# Patient Record
Sex: Female | Born: 1970 | Race: Black or African American | Hispanic: No | Smoking: Never smoker
Health system: Southern US, Community
[De-identification: ages and names within clinical notes are randomized; demographics above are authoritative.]

## PROBLEM LIST (undated history)

## (undated) DIAGNOSIS — D219 Benign neoplasm of connective and other soft tissue, unspecified: Secondary | ICD-10-CM

## (undated) DIAGNOSIS — K219 Gastro-esophageal reflux disease without esophagitis: Secondary | ICD-10-CM

## (undated) DIAGNOSIS — M25569 Pain in unspecified knee: Secondary | ICD-10-CM

## (undated) DIAGNOSIS — R011 Cardiac murmur, unspecified: Secondary | ICD-10-CM

## (undated) DIAGNOSIS — D649 Anemia, unspecified: Secondary | ICD-10-CM

## (undated) DIAGNOSIS — N390 Urinary tract infection, site not specified: Secondary | ICD-10-CM

## (undated) HISTORY — PX: BARIATRIC SURGERY: SHX1103

## (undated) HISTORY — PX: KNEE SURGERY: SHX244

## (undated) HISTORY — PX: ABDOMINAL HYSTERECTOMY: SHX81

## (undated) HISTORY — PX: OTHER SURGICAL HISTORY: SHX169

---

## 1999-01-21 ENCOUNTER — Emergency Department (HOSPITAL_COMMUNITY): Admission: EM | Admit: 1999-01-21 | Discharge: 1999-01-21 | Payer: Self-pay | Admitting: Emergency Medicine

## 2000-02-02 ENCOUNTER — Encounter: Payer: Self-pay | Admitting: Emergency Medicine

## 2000-02-02 ENCOUNTER — Emergency Department (HOSPITAL_COMMUNITY): Admission: EM | Admit: 2000-02-02 | Discharge: 2000-02-02 | Payer: Self-pay | Admitting: Emergency Medicine

## 2000-03-28 ENCOUNTER — Emergency Department (HOSPITAL_COMMUNITY): Admission: EM | Admit: 2000-03-28 | Discharge: 2000-03-28 | Payer: Self-pay | Admitting: Emergency Medicine

## 2000-06-21 ENCOUNTER — Emergency Department (HOSPITAL_COMMUNITY): Admission: EM | Admit: 2000-06-21 | Discharge: 2000-06-21 | Payer: Self-pay | Admitting: Emergency Medicine

## 2001-09-18 ENCOUNTER — Emergency Department (HOSPITAL_COMMUNITY): Admission: EM | Admit: 2001-09-18 | Discharge: 2001-09-18 | Payer: Self-pay | Admitting: Emergency Medicine

## 2003-08-30 ENCOUNTER — Encounter (HOSPITAL_COMMUNITY): Admission: RE | Admit: 2003-08-30 | Discharge: 2003-11-28 | Payer: Self-pay | Admitting: Internal Medicine

## 2003-09-03 ENCOUNTER — Emergency Department (HOSPITAL_COMMUNITY): Admission: EM | Admit: 2003-09-03 | Discharge: 2003-09-03 | Payer: Self-pay | Admitting: Emergency Medicine

## 2003-09-09 ENCOUNTER — Emergency Department (HOSPITAL_COMMUNITY): Admission: EM | Admit: 2003-09-09 | Discharge: 2003-09-09 | Payer: Self-pay | Admitting: Emergency Medicine

## 2003-10-29 ENCOUNTER — Emergency Department (HOSPITAL_COMMUNITY): Admission: EM | Admit: 2003-10-29 | Discharge: 2003-10-29 | Payer: Self-pay | Admitting: Emergency Medicine

## 2003-11-27 ENCOUNTER — Emergency Department (HOSPITAL_COMMUNITY): Admission: EM | Admit: 2003-11-27 | Discharge: 2003-11-27 | Payer: Self-pay | Admitting: Emergency Medicine

## 2004-01-22 ENCOUNTER — Emergency Department (HOSPITAL_COMMUNITY): Admission: EM | Admit: 2004-01-22 | Discharge: 2004-01-23 | Payer: Self-pay | Admitting: Emergency Medicine

## 2006-02-16 ENCOUNTER — Emergency Department (HOSPITAL_COMMUNITY): Admission: EM | Admit: 2006-02-16 | Discharge: 2006-02-16 | Payer: Self-pay | Admitting: Emergency Medicine

## 2007-12-21 ENCOUNTER — Emergency Department (HOSPITAL_COMMUNITY): Admission: EM | Admit: 2007-12-21 | Discharge: 2007-12-21 | Payer: Self-pay | Admitting: Emergency Medicine

## 2009-03-10 ENCOUNTER — Emergency Department (HOSPITAL_BASED_OUTPATIENT_CLINIC_OR_DEPARTMENT_OTHER): Admission: EM | Admit: 2009-03-10 | Discharge: 2009-03-10 | Payer: Self-pay | Admitting: Emergency Medicine

## 2009-06-08 ENCOUNTER — Emergency Department (HOSPITAL_COMMUNITY): Admission: EM | Admit: 2009-06-08 | Discharge: 2009-06-08 | Payer: Self-pay | Admitting: Emergency Medicine

## 2009-08-02 ENCOUNTER — Emergency Department (HOSPITAL_COMMUNITY): Admission: EM | Admit: 2009-08-02 | Discharge: 2009-08-02 | Payer: Self-pay | Admitting: Emergency Medicine

## 2010-05-12 ENCOUNTER — Emergency Department (HOSPITAL_COMMUNITY): Admission: EM | Admit: 2010-05-12 | Discharge: 2010-05-12 | Payer: Self-pay | Admitting: Emergency Medicine

## 2010-05-15 ENCOUNTER — Emergency Department (HOSPITAL_COMMUNITY): Admission: EM | Admit: 2010-05-15 | Discharge: 2010-05-16 | Payer: Self-pay | Admitting: Emergency Medicine

## 2010-05-21 ENCOUNTER — Emergency Department (HOSPITAL_COMMUNITY): Admission: EM | Admit: 2010-05-21 | Discharge: 2010-05-22 | Payer: Self-pay | Admitting: Emergency Medicine

## 2010-07-28 ENCOUNTER — Emergency Department (HOSPITAL_BASED_OUTPATIENT_CLINIC_OR_DEPARTMENT_OTHER)
Admission: EM | Admit: 2010-07-28 | Discharge: 2010-07-28 | Payer: Self-pay | Source: Home / Self Care | Admitting: Emergency Medicine

## 2010-10-16 LAB — RAPID STREP SCREEN (MED CTR MEBANE ONLY): Streptococcus, Group A Screen (Direct): NEGATIVE

## 2010-10-18 LAB — COMPREHENSIVE METABOLIC PANEL
AST: 13 U/L (ref 0–37)
Albumin: 3.2 g/dL — ABNORMAL LOW (ref 3.5–5.2)
Chloride: 109 mEq/L (ref 96–112)
Creatinine, Ser: 0.58 mg/dL (ref 0.4–1.2)
GFR calc Af Amer: >60 mL/min (ref >60)
Potassium: 3 mEq/L — ABNORMAL LOW (ref 3.5–5.1)
Total Bilirubin: 0.3 mg/dL (ref 0.3–1.2)

## 2010-10-18 LAB — URINALYSIS, ROUTINE W REFLEX MICROSCOPIC
Nitrite: NEGATIVE
Specific Gravity, Urine: 1.012 (ref 1.005–1.030)
Urobilinogen, UA: 0.2 mg/dL (ref 0.0–1.0)

## 2010-10-18 LAB — SALICYLATE LEVEL: Salicylate Lvl: <4 mg/dL (ref 2.8–20.0)

## 2010-10-18 LAB — RAPID URINE DRUG SCREEN, HOSP PERFORMED: Tetrahydrocannabinol: NOT DETECTED

## 2010-10-18 LAB — DIFFERENTIAL
Basophils Relative: 1 % (ref 0–1)
Eosinophils Relative: 1 % (ref 0–5)
Monocytes Absolute: 0.2 10*3/uL (ref 0.1–1.0)
Monocytes Relative: 7 % (ref 3–12)
Neutrophils Relative %: 49 % (ref 43–77)

## 2010-10-18 LAB — URINE MICROSCOPIC-ADD ON

## 2010-10-18 LAB — CBC
MCH: 17.2 pg — ABNORMAL LOW (ref 26.0–34.0)
Platelets: 228 10*3/uL (ref 150–400)
RBC: 3.99 MIL/uL (ref 3.87–5.11)
WBC: 3.3 10*3/uL — ABNORMAL LOW (ref 4.0–10.5)

## 2010-10-18 LAB — ACETAMINOPHEN LEVEL: Acetaminophen (Tylenol), Serum: <10 ug/mL — ABNORMAL LOW (ref 10–30)

## 2010-10-18 LAB — POCT PREGNANCY, URINE: Preg Test, Ur: NEGATIVE

## 2010-10-19 LAB — CBC
HCT: 23 % — ABNORMAL LOW (ref 36.0–46.0)
MCH: 16.8 pg — ABNORMAL LOW (ref 26.0–34.0)
MCV: 56.4 fL — ABNORMAL LOW (ref 78.0–100.0)
RBC: 4.08 MIL/uL (ref 3.87–5.11)
RDW: 20.3 % — ABNORMAL HIGH (ref 11.5–15.5)
WBC: 4.6 10*3/uL (ref 4.0–10.5)

## 2010-10-19 LAB — LIPASE, BLOOD: Lipase: 31 U/L (ref 11–59)

## 2010-10-19 LAB — URINALYSIS, ROUTINE W REFLEX MICROSCOPIC
Bilirubin Urine: NEGATIVE
Glucose, UA: NEGATIVE mg/dL
Glucose, UA: NEGATIVE mg/dL
Hgb urine dipstick: NEGATIVE
Protein, ur: NEGATIVE mg/dL
Protein, ur: NEGATIVE mg/dL
pH: 6 (ref 5.0–8.0)

## 2010-10-19 LAB — DIFFERENTIAL
Basophils Absolute: 0 10*3/uL (ref 0.0–0.1)
Eosinophils Absolute: 0 10*3/uL (ref 0.0–0.7)
Monocytes Absolute: 0.4 10*3/uL (ref 0.1–1.0)
Neutrophils Relative %: 43 % (ref 43–77)

## 2010-10-19 LAB — COMPREHENSIVE METABOLIC PANEL
Alkaline Phosphatase: 67 U/L (ref 39–117)
BUN: 10 mg/dL (ref 6–23)
Chloride: 107 mEq/L (ref 96–112)
Glucose, Bld: 97 mg/dL (ref 70–99)
Potassium: 3.3 mEq/L — ABNORMAL LOW (ref 3.5–5.1)
Total Bilirubin: <0.1 mg/dL — ABNORMAL LOW (ref 0.3–1.2)

## 2010-10-19 LAB — URINE MICROSCOPIC-ADD ON

## 2010-10-19 LAB — PREGNANCY, URINE: Preg Test, Ur: NEGATIVE

## 2010-11-06 LAB — URINE MICROSCOPIC-ADD ON

## 2010-11-06 LAB — URINALYSIS, ROUTINE W REFLEX MICROSCOPIC
Glucose, UA: NEGATIVE mg/dL
Ketones, ur: NEGATIVE mg/dL
pH: 6 (ref 5.0–8.0)

## 2010-12-01 ENCOUNTER — Emergency Department (HOSPITAL_BASED_OUTPATIENT_CLINIC_OR_DEPARTMENT_OTHER)
Admission: EM | Admit: 2010-12-01 | Discharge: 2010-12-01 | Disposition: A | Discharge status: Home / Self Care | Payer: Self-pay | Attending: Emergency Medicine | Admitting: Emergency Medicine

## 2010-12-01 DIAGNOSIS — K089 Disorder of teeth and supporting structures, unspecified: Secondary | ICD-10-CM | POA: Insufficient documentation

## 2011-01-08 ENCOUNTER — Emergency Department (HOSPITAL_COMMUNITY): Payer: Medicaid Other

## 2011-01-08 ENCOUNTER — Emergency Department (HOSPITAL_COMMUNITY)
Admission: EM | Admit: 2011-01-08 | Discharge: 2011-01-08 | Disposition: A | Discharge status: Home / Self Care | Payer: Medicaid Other | Attending: Emergency Medicine | Admitting: Emergency Medicine

## 2011-01-08 DIAGNOSIS — X500XXA Overexertion from strenuous movement or load, initial encounter: Secondary | ICD-10-CM | POA: Insufficient documentation

## 2011-01-08 DIAGNOSIS — S93409A Sprain of unspecified ligament of unspecified ankle, initial encounter: Secondary | ICD-10-CM | POA: Insufficient documentation

## 2011-01-08 DIAGNOSIS — Y92009 Unspecified place in unspecified non-institutional (private) residence as the place of occurrence of the external cause: Secondary | ICD-10-CM | POA: Insufficient documentation

## 2011-01-08 DIAGNOSIS — M25476 Effusion, unspecified foot: Secondary | ICD-10-CM | POA: Insufficient documentation

## 2011-01-08 DIAGNOSIS — M25579 Pain in unspecified ankle and joints of unspecified foot: Secondary | ICD-10-CM | POA: Insufficient documentation

## 2011-01-08 DIAGNOSIS — M25473 Effusion, unspecified ankle: Secondary | ICD-10-CM | POA: Insufficient documentation

## 2011-01-08 DIAGNOSIS — S9000XA Contusion of unspecified ankle, initial encounter: Secondary | ICD-10-CM | POA: Insufficient documentation

## 2011-02-06 ENCOUNTER — Inpatient Hospital Stay (HOSPITAL_COMMUNITY)
Admission: EM | Admit: 2011-02-06 | Discharge: 2011-02-09 | DRG: 313 | Disposition: A | Discharge status: Home / Self Care | Payer: Medicaid Other | Attending: Internal Medicine | Admitting: Internal Medicine

## 2011-02-06 ENCOUNTER — Encounter: Payer: Self-pay | Admitting: Internal Medicine

## 2011-02-06 ENCOUNTER — Emergency Department (HOSPITAL_COMMUNITY): Payer: Medicaid Other

## 2011-02-06 DIAGNOSIS — F1410 Cocaine abuse, uncomplicated: Secondary | ICD-10-CM | POA: Diagnosis present

## 2011-02-06 DIAGNOSIS — F3289 Other specified depressive episodes: Secondary | ICD-10-CM | POA: Diagnosis present

## 2011-02-06 DIAGNOSIS — E669 Obesity, unspecified: Secondary | ICD-10-CM | POA: Diagnosis present

## 2011-02-06 DIAGNOSIS — D259 Leiomyoma of uterus, unspecified: Secondary | ICD-10-CM | POA: Diagnosis present

## 2011-02-06 DIAGNOSIS — K5900 Constipation, unspecified: Secondary | ICD-10-CM | POA: Diagnosis present

## 2011-02-06 DIAGNOSIS — N920 Excessive and frequent menstruation with regular cycle: Secondary | ICD-10-CM | POA: Diagnosis present

## 2011-02-06 DIAGNOSIS — E876 Hypokalemia: Secondary | ICD-10-CM | POA: Diagnosis present

## 2011-02-06 DIAGNOSIS — K219 Gastro-esophageal reflux disease without esophagitis: Secondary | ICD-10-CM | POA: Diagnosis present

## 2011-02-06 DIAGNOSIS — D500 Iron deficiency anemia secondary to blood loss (chronic): Secondary | ICD-10-CM | POA: Diagnosis present

## 2011-02-06 DIAGNOSIS — F329 Major depressive disorder, single episode, unspecified: Secondary | ICD-10-CM | POA: Diagnosis present

## 2011-02-06 DIAGNOSIS — R0789 Other chest pain: Principal | ICD-10-CM | POA: Diagnosis present

## 2011-02-06 DIAGNOSIS — Z880 Allergy status to penicillin: Secondary | ICD-10-CM

## 2011-02-06 DIAGNOSIS — K648 Other hemorrhoids: Secondary | ICD-10-CM | POA: Diagnosis present

## 2011-02-06 LAB — DIFFERENTIAL
Basophils Absolute: 0 10*3/uL (ref 0.0–0.1)
Eosinophils Absolute: 0.1 10*3/uL (ref 0.0–0.7)
Lymphs Abs: 1.9 10*3/uL (ref 0.7–4.0)
Monocytes Absolute: 0.3 10*3/uL (ref 0.1–1.0)

## 2011-02-06 LAB — RETICULOCYTES
RBC.: 3.69 MIL/uL — ABNORMAL LOW (ref 3.87–5.11)
Retic Count, Absolute: 40 10*3/uL (ref 19.0–186.0)
Retic Ct Pct: 1.2 % (ref 0.4–3.1)

## 2011-02-06 LAB — COMPREHENSIVE METABOLIC PANEL
ALT: <5 U/L (ref 0–35)
AST: 8 U/L (ref 0–37)
Alkaline Phosphatase: 67 U/L (ref 39–117)
CO2: 28 mEq/L (ref 19–32)
Calcium: 8.8 mg/dL (ref 8.4–10.5)
Chloride: 105 mEq/L (ref 96–112)
GFR calc Af Amer: >60 mL/min (ref >60)
GFR calc non Af Amer: >60 mL/min (ref >60)
Glucose, Bld: 87 mg/dL (ref 70–99)
Potassium: 3 mEq/L — ABNORMAL LOW (ref 3.5–5.1)
Sodium: 140 mEq/L (ref 135–145)
Total Bilirubin: 0.2 mg/dL — ABNORMAL LOW (ref 0.3–1.2)

## 2011-02-06 LAB — URINALYSIS, ROUTINE W REFLEX MICROSCOPIC
Bilirubin Urine: NEGATIVE
Glucose, UA: NEGATIVE mg/dL
Ketones, ur: NEGATIVE mg/dL
Leukocytes, UA: NEGATIVE
Specific Gravity, Urine: 1.02 (ref 1.005–1.030)
pH: 6 (ref 5.0–8.0)

## 2011-02-06 LAB — APTT: aPTT: 30 seconds (ref 24–37)

## 2011-02-06 LAB — RAPID URINE DRUG SCREEN, HOSP PERFORMED
Amphetamines: NOT DETECTED
Barbiturates: NOT DETECTED
Benzodiazepines: NOT DETECTED
Cocaine: NOT DETECTED

## 2011-02-06 LAB — CBC
Hemoglobin: 6 g/dL — CL (ref 12.0–15.0)
MCH: 16.4 pg — ABNORMAL LOW (ref 26.0–34.0)
MCHC: 28.2 g/dL — ABNORMAL LOW (ref 30.0–36.0)
MCV: 60.9 fL — ABNORMAL LOW (ref 78.0–100.0)
MCV: 59.8 fL — ABNORMAL LOW (ref 78.0–100.0)
Platelets: 321 10*3/uL (ref 150–400)
Platelets: 305 10*3/uL (ref 150–400)
RBC: 3.68 MIL/uL — ABNORMAL LOW (ref 3.87–5.11)
RDW: 19.3 % — ABNORMAL HIGH (ref 11.5–15.5)
RDW: 19.7 % — ABNORMAL HIGH (ref 11.5–15.5)
WBC: 4.3 10*3/uL (ref 4.0–10.5)

## 2011-02-06 LAB — CARDIAC PANEL(CRET KIN+CKTOT+MB+TROPI)
Relative Index: INVALID (ref 0.0–2.5)
Total CK: 41 U/L (ref 7–177)
Troponin I: <0.3 ng/mL (ref <0.30)

## 2011-02-06 LAB — POCT I-STAT, CHEM 8
Calcium, Ion: 1.17 mmol/L (ref 1.12–1.32)
Glucose, Bld: 94 mg/dL (ref 70–99)
HCT: 23 % — ABNORMAL LOW (ref 36.0–46.0)
Hemoglobin: 7.8 g/dL — ABNORMAL LOW (ref 12.0–15.0)
Potassium: 3.1 mEq/L — ABNORMAL LOW (ref 3.5–5.1)
TCO2: 25 mmol/L (ref 0–100)

## 2011-02-06 LAB — OCCULT BLOOD, POC DEVICE: Fecal Occult Bld: NEGATIVE

## 2011-02-06 LAB — HEMOGLOBIN A1C: Hgb A1c MFr Bld: 5.6 % (ref <5.7)

## 2011-02-06 LAB — FOLATE: Folate: 10.4 ng/mL

## 2011-02-06 LAB — IRON AND TIBC: Iron: 12 ug/dL — ABNORMAL LOW (ref 42–135)

## 2011-02-06 LAB — VITAMIN B12: Vitamin B-12: 401 pg/mL (ref 211–911)

## 2011-02-06 LAB — CK TOTAL AND CKMB (NOT AT ARMC)
CK, MB: 0.8 ng/mL (ref 0.3–4.0)
Total CK: 42 U/L (ref 7–177)

## 2011-02-06 LAB — ABO/RH: ABO/RH(D): AB POS

## 2011-02-06 NOTE — H&P (Signed)
Hospital Admission Note Date: 02/06/2011  Patient name: Karen Lutz Medical record number: [id number] Date of birth: 1970-08-12 Age: 40 y.o. Gender: female PCP: No primary provider on file.   Attending physician: Dr. Aundria Rud  First contact: Resident (R1): Dr. Candy Sledge Pager: [phone] Second contact: Resident (R3): Dr. Denton Meek Pager: [phone]  Weekends, holidays, and after 5 PM weekdays First contact: [phone] Second contact: [phone]   Chief Complaint: chest pain  History of Present Illness:  Patient is a 40 year old female with past medical history significant for anemia who presents with acute onset of substernal chest pain that woke her from sleep at approximately 6 AM on the day of admission. She describes her pain as constant, nonradiating, sharp, 10 out of 10 central chest pain that improved to a 7/10 after taking nitroglycerin. She notes that her pain worsens with palpation. She denies any other precipitating, aggravating, or alleviating factors. She denies shortness of breath, dyspnea on exertion, palpitations, nausea, vomiting, diaphoresis, fever, chills, and syncope.  She admits to poorly controlled reflux with increased symptoms recently. She notes recent increase in stress at home; she is having some conflict with family members and also notes that her girlfriend recently moved out of their home.  She admits to pressure in and denies suicidal/homicidal ideation.  After further discussion patient admits to painful bowel movements over the past 3 weeks prior to her arrival that began after her problems with hard stools. She notes the presence of bright red blood on tissues with wiping. She denies passing copious amounts of bright red blood or clots.  She admits to intermittent dark, tarry stools occurring over the past few years. She states this has never been worked up.  She states her most recent episode of this occurred approximately 4-5 days prior to her admission and is now  resolved.  She admits to increased loose stools over the past few days prior to her arrival.  Denies any sick contacts.  Home meds:   Patient states of his recently prescribed Abilify approximately 2 weeks ago by a nurse practitioner and friend with whom she works.  She became nauseated and experienced vomiting after beginning this medication she has since stopped.  She states that this was prescribed with Lamictal to help her depression and anger.  She has also not taken Lamictal over the past few days.  She denies taking any other prescribed medications, over-the-counter meds, or supplements.  Her last menstrual period began on 02/01/2011 and ended on 02/05/11.  Allergies: PCN (hives, rash)   PMH Anemia, microcytic    - Unknown etiology    - baseline Hbg 7.0    - She has required transfusion of PRBCs in the past; last transfusion occurred in 2004 GERD   Family Hx: Father: Diabetes mellitus, hypertension, MI in his late 27s, s/p CABG Mother: Hypertension, schizophrenia, numerous mental health issues. Grandmother: Diabetes mellitus, hypertension, deceased secondary to gangrene Grandfather: Diabetes mellitus, hypertension, alcoholism She reports that numerous family members have problems with alcoholism and drug addiction.  Social Hx: Currently living alone in [place].  She was living with her girlfriend however they recently had an argument and her girlfriend has moved out. She works full time as an Counsellor at [employer]. She has never smoked and does not drink alcohol.  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  She denies any current illicit drug use.   Review of Systems: Pertinent items are noted in HPI.  Physical Exam: Vitals: T: 98.1, HR: 66, RR 19, BP 96/57, O2 sat: 100% RA VItal signs reviewed and stable.  Blood pressure slightly low; patient asymptomatic. GEN: No apparent distress but tearful throughout  interview.  Alert and oriented x 3.  Pleasant, conversant, and cooperative to exam. HEENT: head is autraumatic and normocephalic.  Neck is supple without palpable masses or lymphadenopathy.  No JVD or carotid bruits.  Vision intact.  EOMI.  PERRLA.  Sclerae anicteric.  Conjunctivae with slight pallor; no injection. Mucous membranes are moist.  Oropharynx is without erythema, exudates, or other abnormal lesions.  RESP:  Lungs are clear to ascultation bilaterally with good air movement.  No wheezes, ronchi, or rubs. CARDIOVASCULAR: regular rate, normal rhythm.  Clear S1, S2, no murmurs, gallops, or rubs.  Pain is elicited with palpation of the sternum, bilateral costal-chondral joints, and left anterior chest wall. ABDOMEN: soft, non-tender, non-distended.  Bowels sounds present in all quadrants and normoactive.  No palpable masses. RECTAL: No external hemorrhoids noted. No anal fissure, abnormal lesions, or other external abnormality observed.  No internal hemorrhoids or palpable masses on digital rectal exam. Stool was light brown in color,  No evidence of blood on exam. FOBT negative EXT: warm and dry.  Peripheral pulses equal, intact, and +2 globally.  No clubbing or cyanosis.  No edema in bilateral lower extremities. SKIN: warm and dry with normal turgor.  No rashes or abnormal lesions observed. NEURO: CN II-XII grossly intact.  Muscle strength +5/5 in bilateral upper and lower extremities.  Sensation is grossly intact.  No focal deficit.   Lab results:  Ionized Calcium                          1.17              1.12-1.32        mmol/L  Hemoglobin (HGB)                         7.8        l      12.0-15.0        g/dL  Hematocrit (HCT)                         23.0       l      36.0-46.0        %  Sodium (NA)                              141               135-145          mEq/L  Potassium (K)                            3.1        l      3.5-5.1          mEq/L  Chloride                                  105               96-112           MEq/L  TCO2                                     25                0-100            mmol/L  Glucose                                  94                70-99            mg/dL  BUN                                      7                 6-23             mg/dL  Creatinine                               0.60              0.50-1.10        Mg/dL  Creatine Kinase, Total                   42                7-177            U/L  CK, MB                                   0.8               0.3-4.0          ng/mL  Relative Index                           SEE NOTE.         0.0-2.5 Troponin I                               <0.30             <0.30            Ng/mL  HCG-Qualitative, Urine                   NEGATIVE   Color, Urine                             YELLOW            YELLOW  Appearance                               CLEAR             CLEAR  Specific Gravity  1.020             1.005-1.030  pH                                       6.0               5.0-8.0  Urine Glucose                            NEGATIVE          NEG              mg/dL  Bilirubin                                NEGATIVE          NEG  Ketones                                  NEGATIVE          NEG              mg/dL  Blood                                    NEGATIVE          NEG  Protein                                  NEGATIVE          NEG              mg/dL  Urobilinogen                             1.0               0.0-1.0          mg/dL  Nitrite                                  NEGATIVE          NEG  Leukocytes                               NEGATIVE          NEG   WBC                                      3.8        l      4.0-10.5         K/uL  RBC                                      3.69       l      3.87-5.11  MIL/uL  Hemoglobin (HGB)                         6.2        L      12.0-15.0        g/dL  Hematocrit (HCT)                         22.0       l      36.0-46.0         %  MCV                                      59.6       l      78.0-100.0       fL  MCH -                                    16.8       l      26.0-34.0        pg  MCHC                                     28.2       l      30.0-36.0        g/dL  RDW                                      19.3       h      11.5-15.5        %  Platelet Count (PLT)                     331               150-400          K/uL  Neutrophils, %                           40         l      43-77            %  Lymphocytes, %                           48         h      12-46            %  Monocytes, %                             9                 3-12             %  Eosinophils, %                           2  0-5              %  Basophils, %                             1                 0-1              %  Neutrophils, Absolute                    1.5        l      1.7-7.7          K/uL  Lymphocytes, Absolute                    1.9               0.7-4.0          K/uL  Monocytes, Absolute                      0.3               0.1-1.0          K/uL  Eosinophils, Absolute                    0.1               0.0-0.7          K/uL  Basophils, Absolute                      0.0               0.0-0.1          K/uL  RBC Morphology                           SEE NOTE.    ELLIPTOCYTES    POLYCHROMASIA PRESENT  Imaging results:  CXR 2 view:  Findings: Heart and mediastinal contours appear within normal limits.  The lung fields appear clear with no signs of focal infiltrate or congestive failure.  No pleural fluid or peribronchial cuffing is seen.  No pneumothorax is noted.  No focal bony abnormalities are identified.  IMPRESSION: Normal cardiopulmonary appearance with no worrisome focal or acute abnormality identified.   Assessment & Plan by Problem:  Chest pain: Patient is a 40 year old female presenting with acute onset of nonradiating substernal chest pain.  She is not tachycardic, tachypnea, or hypoxic  and has no risk factors for PE; her revised Geneva score is 0 consistent with a very low probability for pulmonary embolus.  Will not proceed with further evaluation for PE at this time.  Her initial sets of cardiac markers was within normal limits and her EKG reveals normal sinus rhythm with nonspecific T wave flattening from V1 to V5; there are no EKG findings consistent with acute ischemia or other concerning abnormality.  Her chest x-ray does not reveal any evidence to suggest pneumonia, pneumothorax, or mediastinal widening consistent with aortic dissection.  It seems unlikely that her chest pain is related to elicit drug use, particularly crack cocaine as she reports a 4 year period of sobriety and her UDS is negative for illicit substances. Her chest pain is most likely the result of chest wall tenderness  and underlying, poorly controlled GERD likely exacerbated by her recent increased stress and anxiety.  She reports a significant family history for coronary artery disease; will fully evaluate her for ACS. - Admit to telemetry - Cycle cardiac enzymes - Repeat 12-lead EKG in a.m. - Risk stratify with fasting lipid panel and hemoglobin A1c - Protonix for underlying GERD - Sublingual nitroglycerin and morphine for pain control  Anemia, microcytic: Patient reports a long history of anemia that she feels may be related to heavy menstruation. Review of E-Chart reveals her baseline hemoglobin is approximately 7. She denies syncope, shortness of breath, dyspnea on exertion and is currently asymptomatic.  Her rectal exam is within normal limits with a negative FOBT, however it is still possible she is experiencing blood loss from a GI source.   - Will check coags - Cycle CBCs Q8 - Will type and screen for 2 units PRBCs and hold transfusion for now; will transfuse if her repeat CBC revealed hemoglobin less than or equal to 6, or if she becomes symptomatic - Will place 2 large-bore IVs in the event she  requires urgent transfusion - Repeat FOBT - Will check an anemia panel, including reticulocyte count  Hypokalemia: It seems most likely that her mild hypokalemia as a result of increased loose stools and diarrhea. Her i-STAT chemistry panel reveals a normal CO2 with anion gap of 11.  Will repeat a comprehensive metabolic panel.  Will replete her potassium orally and check a magnesium level.    Hypotension:  Patient's blood pressure is slightly low in the high 90s to low 110s systolic.  She is currently asymptomatic with a normal heart rate.  Will administer a normal saline bolus and continue to follow closely.  VTE prophy: SCDs  Dispo: Admit to tele.  She is currently without a primary care provider; will ensure she has followed established at the [facility].   ____________ Nelda Bucks, PGY-3     ____________ Quentin Ore, PGY-1

## 2011-02-07 ENCOUNTER — Observation Stay (HOSPITAL_COMMUNITY): Payer: Medicaid Other

## 2011-02-07 DIAGNOSIS — N926 Irregular menstruation, unspecified: Secondary | ICD-10-CM

## 2011-02-07 DIAGNOSIS — N939 Abnormal uterine and vaginal bleeding, unspecified: Secondary | ICD-10-CM

## 2011-02-07 DIAGNOSIS — D508 Other iron deficiency anemias: Secondary | ICD-10-CM

## 2011-02-07 DIAGNOSIS — K922 Gastrointestinal hemorrhage, unspecified: Secondary | ICD-10-CM

## 2011-02-07 DIAGNOSIS — R079 Chest pain, unspecified: Secondary | ICD-10-CM

## 2011-02-07 DIAGNOSIS — D649 Anemia, unspecified: Secondary | ICD-10-CM

## 2011-02-07 LAB — CBC
Hemoglobin: 7.6 g/dL — ABNORMAL LOW (ref 12.0–15.0)
MCH: 16.6 pg — ABNORMAL LOW (ref 26.0–34.0)
MCHC: 28.5 g/dL — ABNORMAL LOW (ref 30.0–36.0)
Platelets: 319 10*3/uL (ref 150–400)
RBC: 3.74 MIL/uL — ABNORMAL LOW (ref 3.87–5.11)
WBC: 3.7 10*3/uL — ABNORMAL LOW (ref 4.0–10.5)
WBC: 4.7 10*3/uL (ref 4.0–10.5)

## 2011-02-07 LAB — LIPID PANEL
Cholesterol: 124 mg/dL (ref 0–200)
LDL Cholesterol: 66 mg/dL (ref 0–99)
Total CHOL/HDL Ratio: 3 RATIO
VLDL: 16 mg/dL (ref 0–40)

## 2011-02-07 LAB — BASIC METABOLIC PANEL
BUN: 9 mg/dL (ref 6–23)
CO2: 26 mEq/L (ref 19–32)
Chloride: 108 mEq/L (ref 96–112)
Creatinine, Ser: 0.53 mg/dL (ref 0.50–1.10)
Glucose, Bld: 83 mg/dL (ref 70–99)

## 2011-02-07 LAB — LIPASE, BLOOD: Lipase: 46 U/L (ref 11–59)

## 2011-02-08 DIAGNOSIS — K921 Melena: Secondary | ICD-10-CM

## 2011-02-08 DIAGNOSIS — K625 Hemorrhage of anus and rectum: Secondary | ICD-10-CM

## 2011-02-08 DIAGNOSIS — D509 Iron deficiency anemia, unspecified: Secondary | ICD-10-CM

## 2011-02-08 DIAGNOSIS — K648 Other hemorrhoids: Secondary | ICD-10-CM

## 2011-02-08 LAB — BASIC METABOLIC PANEL
GFR calc Af Amer: >60 mL/min (ref >60)
GFR calc non Af Amer: >60 mL/min (ref >60)
Glucose, Bld: 80 mg/dL (ref 70–99)
Potassium: 3.2 mEq/L — ABNORMAL LOW (ref 3.5–5.1)
Sodium: 141 mEq/L (ref 135–145)

## 2011-02-08 LAB — CBC
Hemoglobin: 8.4 g/dL — ABNORMAL LOW (ref 12.0–15.0)
MCH: 18.2 pg — ABNORMAL LOW (ref 26.0–34.0)
MCHC: 29.6 g/dL — ABNORMAL LOW (ref 30.0–36.0)
MCV: 61.6 fL — ABNORMAL LOW (ref 78.0–100.0)

## 2011-02-09 DIAGNOSIS — D649 Anemia, unspecified: Secondary | ICD-10-CM

## 2011-02-09 DIAGNOSIS — R079 Chest pain, unspecified: Secondary | ICD-10-CM

## 2011-02-09 LAB — BASIC METABOLIC PANEL
BUN: 7 mg/dL (ref 6–23)
Calcium: 8.8 mg/dL (ref 8.4–10.5)
Chloride: 107 mEq/L (ref 96–112)
Creatinine, Ser: 0.58 mg/dL (ref 0.50–1.10)
GFR calc Af Amer: >60 mL/min (ref >60)
GFR calc non Af Amer: >60 mL/min (ref >60)

## 2011-02-09 LAB — CBC
MCHC: 27.9 g/dL — ABNORMAL LOW (ref 30.0–36.0)
MCV: 62.4 fL — ABNORMAL LOW (ref 78.0–100.0)
Platelets: 301 10*3/uL (ref 150–400)
RDW: 21.8 % — ABNORMAL HIGH (ref 11.5–15.5)
WBC: 5.6 10*3/uL (ref 4.0–10.5)

## 2011-02-10 LAB — TYPE AND SCREEN
Antibody Screen: NEGATIVE
Unit division: 0

## 2011-02-12 NOTE — Discharge Summary (Signed)
Pt admitted on 7/3 with substernal chest pain and Hgb 6. Cardiac w/u for CP negative. Endoscopy and colonoscopy revealed internal hemorrhoids and no other source of bleeding. Pelvic and transvaginal US revealed leiomyoma, likely source of bleeding. Was discharged on 7/6. Will need ob-gyn f/u. Please recheck Hgb. Pt also endorses depression. Was started on Celexa.

## 2011-02-12 NOTE — Discharge Summary (Signed)
Karen Lutz, KNISKERN           ACCOUNT NO.:  [id number]  MEDICAL RECORD NO.:  [id number]  LOCATION:  2025                         FACILITY:  [facility]  PHYSICIAN:  C. Ulyess Mort, M.D.DATE OF BIRTH:  1971-06-26  DATE OF ADMISSION:  02/06/2011 DATE OF DISCHARGE:  02/09/2011                              DISCHARGE SUMMARY   DISCHARGE DIAGNOSES: 1. 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 2. Musculoskeletal wall chest pain. 4. Depression. 5. Gastroesophageal reflux. 6. Constipation. 7. Internal hemorrhoids per colonsocopy.  DISCHARGE MEDICATIONS: 1. Citalopram 20 mg p.o. daily. 2. Ferrous sulfate 325 mg p.o. t.i.d. 3. MiraLax 17 g or 1 capsule daily, dissolved in 8 ounces of fluid. 4. Omeprazole 20 mg p.o. daily.  The patient was advised to discontinue use of NSAIDs due to risk of gastric ulcer.  DISPOSITION AND FOLLOWUP:  Patient was discharged in stable condition. Karen Lutz will follow up with Dr. Blanca Friend at [facility] on February 15, 2011, at 1:30 p.m. to establish primary care.  At the time of her visit, please 1. Check hemoglobin. 2. Arrange for OB/GYN referral for uterine fibroids - likely would need a hysterectomy. 3. Discuss symptoms of depression and medication management. 4. Evaluate need for continued use of omeprazole for gastric reflux.  PROCEDURES PERFORMED:  Upper endoscopy and colonoscopy on February 08, 2011, revealed internal hemorrhoids.  No ulcers, masses, or other abnormalities were visualized.  No source of active bleeding identified.  RADIOLOGY:  Pelvic and transvaginal ultrasound:  Uterine leiomyoma measuring 3.75 x 3.4 x 4.3 cm in the dorsal body of the uterus, extending to the base of the endometrium.  CONSULTATION:  None.  ADMITTING HISTORY AND PHYSICAL:  The patient is a 40 year old female with past medical history significant for anemia who presents with acute onset of substernal  chest pain that woke her up from sleep at approximately 6 a.m. on the day of admission.  Karen Lutz describes her pain as constant, nonradiating, sharp, 10/10, central chest pain that improved to 7/10 after taking nitroglycerin.  Karen Lutz notes her pain worsens with palpation. Karen Lutz denies any other precipitating, aggravating, or alleviating factors. Karen Lutz denies shortness of breath, dyspnea on exertion, palpitations, nausea, vomiting, diaphoresis, fever, chills, and syncope.  Karen Lutz admits to poorly-controlled reflux with increasing symptoms recently.  Karen Lutz notes recent increase in stress at home.  Karen Lutz is having some conflicts with the family members and also notes her girlfriend recently moved out of their home.  Karen Lutz admits to depression and denies suicidal or homicidal ideation.  After further discussion, the patient admits to painful bowel movements over the past few weeks prior to arrival that began after her problems with hard stools.  Karen Lutz notes the presence of bright red blood on tissues with wiping.  Karen Lutz denies passing copious amounts of bright red blood or clot.  Karen Lutz admits to intermittent dark tarry stools occurring over the past few years.  Karen Lutz states that this has never been worked up.  Karen Lutz states her most recent episode of this occurred approximately 4-5 days prior to admission and it is now resolved.  Karen Lutz admits to increased loose stools over the past few days prior to  her arrival at the hospital.  Karen Lutz denies any sick contacts.  HOME MEDICATIONS:  The patient states Karen Lutz was recently prescribed Abilify approximately 2 weeks ago by nurse practitioner.   Karen Lutz has become  nauseated and experienced vomiting after beginning this medication.  Karen Lutz since stopped this medication. Karen Lutz states that this was prescribed with Lamictal to help her depression and anger.  Karen Lutz has also not taking Lamictal over the past few days. Karen Lutz denies taking any other prescribed medicines, over-the-counter medicines or  supplements.  Her last menstrual period began on February 01, 2011 and ended on February 05, 2011.  ALLERGIES:  Karen Lutz has allergies to PENICILLIN which causes hives or rash.  PAST MEDICAL HISTORY:  Significant for microcytic anemia of unknown etiology with a baseline hemoglobin of 7.0.  Karen Lutz has required transfusion of packed red blood cells in the past, last transfusion occurred in 2004.  Past medical history is also significant for gastric reflux.  ADMITTING LAB RESULTS:  Hemoglobin 7.8, hematocrit 23.0.  Sodium 141, potassium 3.1, chloride 105, bicarb 25, glucose 94, BUN 7, and creatinine 0.60.  Creatine kinase total 42, CK-MB 0.8, troponin less than 0.30.  UPT negative.  UA negative.  WBC 3.8, hemoglobin 6.2, hematocrit 22.0, MCV 59.6, platelet 331.  ADMISSION STUDIES:  EKG, sinus bradycardia and no evidence of ischemia. Chest x-ray, normal with no acute or focal abnormalities.  HOSPITAL COURSE:  Karen Lutz is a 40 year old woman who was admitted to our service with chest pain and a hemoglobin of 6.2.  Following is the summary of this hospital course presented by problem identified.  1. Chest pain.  The patient's chest pain was felt to be unlikely     cardiac in nature given its reproducibility with palpation of the     sternum, as well as a normal EKG findings and negative cardiac     biomarkers.  Acute pulmonary disease was excluded by a normal chest     x-ray.  The pain was thought to be musculoskeletal in origin,     possibly costochondritis.  Acute MI was excluded with serial     biomarkers and the patient's pain improved with observation and     conservative management.  On discharge, this pain had resolved.     The patient had a normal lipid panel and was normotensive     throughout hospitalization. 2. Iron deficiency anemia.  At the time of admission, the patient had     a hemoglobin of 6 with iron panel consistent with iron deficiency     anemia.  Fecal occult blood tests were  negative x2.  The patient     was given 1 unit of packed red blood cells, and hemoglobin improved     to 7.6. Iron supplementation was initiated during hospitalization     and should be continued as an outpatient.  The patient endorses     intermittent melena over the past 5 years as well as bright red     blood on toilet paper after defecation.  The patient underwent     upper endoscopy and colonoscopy to look for possible source of     bleeding.  Internal hemorrhoids were the only abnormalities     identified.  Additionally, the patient endorsed menorrhagia and     underwent pelvic and transvaginal ultrasounds to investigate     gynecologic source of bleeding.  A uterine fibroid was identified.     The patient did not require additional packed red blood  cells     transfusions and had a hemoglobin of 7.6 on the day of discharge.     The patient should follow up with OB/GYN to discuss management     options for uterine fibroids.  PCP should continue to monitor     hemoglobin. 3. Depression.  The patient endorses feelings of depression and at     times anger which Karen Lutz attributes to family and relationship stress.     Karen Lutz denies any SI/HI or mania.     Karen Lutz works at a [employer] facility, and one of the nurse     practitioners who also works at the facility started her on Abilify     and Lamictal for her psychiatric complaints.  We did not continue     these medications during hospitalization.  We initiated Celexa 20     mg daily as the first line treatment for depression.  The patient     was counseled that this medication may take 4-8 weeks to work and     was encouraged to discuss her psychiatric medicines with her new     primary care physician at her followup appointment. 4. Gastric reflux.  The patient endorses chronic reflux symptoms     including throat irritation and a sensation of food and acid in the     back of her mouth.  Karen Lutz was given Protonix during hospitalization      and given a prescription for omeprazole at discharge.  DISCHARGE CONDITION:  The patient was discharged in improved condition.  DISCHARGE VITALS:  Temperature 98.4, pulse 55, respirations 19, blood pressure 114/62, O2 saturation 98% on room air.  DISCHARGE LABS:  Sodium 141, potassium 3.5, chloride 107, CO2 27, glucose 70, BUN 7, creatinine 0.58, calcium 8.8.  WBC 5.6, hemoglobin 7.6, hematocrit 27.2, MCV 62.4, platelet count 301.    ______________________________ Wendall Papa   ______________________________ C. Ulyess Mort, M.D.    ER/MEDQ  D:  02/09/2011  T:  02/10/2011  Job:  [id number]  Electronically Signed by Deatra Robinson  on 02/10/2011 10:21:03 AM Electronically Signed by Eliezer Lofts M.D. on 02/12/2011 08:28:10 AM

## 2011-02-15 ENCOUNTER — Encounter: Payer: Self-pay | Admitting: Internal Medicine

## 2011-02-15 ENCOUNTER — Ambulatory Visit (INDEPENDENT_AMBULATORY_CARE_PROVIDER_SITE_OTHER): Payer: Medicaid Other | Admitting: Internal Medicine

## 2011-02-15 DIAGNOSIS — D509 Iron deficiency anemia, unspecified: Secondary | ICD-10-CM | POA: Insufficient documentation

## 2011-02-15 DIAGNOSIS — D259 Leiomyoma of uterus, unspecified: Secondary | ICD-10-CM

## 2011-02-15 DIAGNOSIS — K219 Gastro-esophageal reflux disease without esophagitis: Secondary | ICD-10-CM | POA: Insufficient documentation

## 2011-02-15 DIAGNOSIS — D649 Anemia, unspecified: Secondary | ICD-10-CM

## 2011-02-15 DIAGNOSIS — F39 Unspecified mood [affective] disorder: Secondary | ICD-10-CM | POA: Insufficient documentation

## 2011-02-15 LAB — TSH: TSH: 0.801 u[IU]/mL (ref 0.350–4.500)

## 2011-02-15 MED ORDER — OMEPRAZOLE 20 MG PO CPDR: 20.0000 mg | DELAYED_RELEASE_CAPSULE | Freq: Every day | ORAL | Status: DC

## 2011-02-15 MED ORDER — FERROUS SULFATE 325 (65 FE) MG PO TBEC: 325.0000 mg | DELAYED_RELEASE_TABLET | Freq: Three times a day (TID) | ORAL | Status: DC

## 2011-02-15 MED ORDER — DOCUSATE SODIUM 100 MG PO CAPS: 100.0000 mg | ORAL_CAPSULE | Freq: Two times a day (BID) | ORAL | Status: DC

## 2011-02-15 NOTE — Progress Notes (Signed)
Subjective:    Patient ID: Karen Lutz, female    DOB: 02/27/1971, 40 y.o.   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  Cardiac workup was negative, urine drug screen was negative, an inpatient team concluded that GERD was most likely cause of her chest pain. She was found to be anemic with a hemoglobin of 6.2 on admission which was elevated to 7.6 at discharge after transfusion.  Iron panel workup indicated this was an iron deficiency anemia with a ferritin of 4.    She has a one-month history of depressed mood with a decreased interest in her normal fun activities and decreased appetite with associated 20 pound weight loss per patient.  She admits to having recent thoughts of self-harm but has not had any thoughts of a plan to do so.  She has seen at [place] in the past for counseling.  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  She went to see a nurse practitioner who she works with who prescribed her Abilify and Lamictal. She discontinued these due to nausea and vomiting. She gives a history of episodes of depressed mood throughout her life as well as anger management problems.   Review of Systems Review of Systems - History obtained from the patient General ROS: positive for  - weight loss negative for - chills, fatigue or fever Psychological ROS: see HPI Respiratory ROS: no cough, shortness of breath, or wheezing Cardiovascular ROS: no dyspnea on exertion or palpitations Gastrointestinal ROS: no abdominal pain, change in bowel habits, or black or bloody stools Genito-Urinary ROS: no dysuria, trouble voiding, or hematuria Musculoskeletal ROS: negative for - gait disturbance, joint pain or muscle pain Neurological ROS: negative for - bowel and bladder control  changes, headaches, impaired coordination/balance or numbness/tingling Dermatological ROS: Negative for rash     Objective:   Physical Exam   General: alert, well-developed, and cooperative to examination.  Head: normocephalic and atraumatic.  Eyes: vision grossly intact, pupils equal, pupils round, pupils reactive to light, no injection and anicteric.  Mouth: pharynx pink and moist, no erythema, and no exudates.  Lungs: normal respiratory effort, no accessory muscle use, normal breath sounds, no crackles, and no wheezes. Heart: normal rate, regular rhythm, no murmur, no gallop, and no rub.  Msk: no joint swelling, no joint warmth, and no redness over joints.  Pulses: 2+ DP/PT pulses bilaterally Extremities: No cyanosis, clubbing, edema Neurologic: alert & oriented X3, cranial nerves II-XII intact, strength normal in all extremities, and gait normal.  Skin: turgor normal and no rashes.  Psych: Oriented X3, memory intact for recent and remote, normally interactive, good eye contact, not anxious appearing, and not depressed appearing.    Assessment & Plan:

## 2011-02-15 NOTE — Progress Notes (Deleted)
  Subjective:    Patient ID: Karen Lutz, female    DOB: August 06, 1971, 40 y.o.   MRN: [id number]  HPI    Review of Systems     Objective:   Physical Exam        Assessment & Plan:

## 2011-02-15 NOTE — Assessment & Plan Note (Signed)
Refer to [facility] for evaluation of heavy mentrual bleeding in setting of finding uterine fibroid on ultrasound in hospital.

## 2011-02-15 NOTE — Assessment & Plan Note (Signed)
Hospital workup concluded that GERD was most likely cause of her chest pain.  She takes 3-4 Zantac 150mg  per day for heartburn. Given this high dose of H2 blocker she warrants PPI therapy.  Omeprazole 20mg  PO daily

## 2011-02-15 NOTE — Assessment & Plan Note (Signed)
Iron deficiency anemia based on labs during hospitalization.  Based on normal EGD, colonoscopy just finding non-bleeding hemorrhoids, and finding of uterine fibroids on ultrasound in hospital, Karen Lutz's anemia is likely from her heavy menstrual bleeding, which is likely from fibroids.    Plan: Refer to OB Gyn.   Check CBC today Iron Supplementation 325mg  Iron Sulfate PO TID Colace 100mg  BID for stool softening in setting of iron therapy

## 2011-02-15 NOTE — Assessment & Plan Note (Addendum)
Karen Lutz gives a history of depression and trouble controlling her anger.  Her current depression is likely exacerbated by her recent breakup with her significant other.  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 This history is concerning that Karen Lutz could possibly have a mood disorder with both depressive and manic characteristics.  She admits to recent thoughts of self-harm but no plan of how to harm herself.   She needs a full mental health evaluation.   She promised [place] today that she would not hurt herself and that she would go to [facility] tomorrow morning for evaluation.  We will hold off on starting any medications until she is fully evaluated. We also gave her phone numbers for multiple hotlines that she can call should she ever be having thoughts of self-harm.    Check TSH today

## 2011-02-15 NOTE — Patient Instructions (Signed)
Please go to [facility] by tomorrow morning.  Please return to clinic in 2 weeks.   We are referring you to Obstetrics and Gynecology.  Our clinic social worker will also be getting in contact with you.

## 2011-02-16 LAB — CBC
MCHC: 28.1 g/dL — ABNORMAL LOW (ref 30.0–36.0)
Platelets: 309 10*3/uL (ref 150–400)
RDW: 25.1 % — ABNORMAL HIGH (ref 11.5–15.5)
WBC: 5.1 10*3/uL (ref 4.0–10.5)

## 2011-02-19 NOTE — Discharge Summary (Signed)
  Karen Lutz, Karen Lutz           ACCOUNT NO.:  [id number]  MEDICAL RECORD NO.:  [id number]  LOCATION:  2025                         FACILITY:  [facility]  PHYSICIAN:  C. Ulyess Mort, M.D.DATE OF BIRTH:  1971-03-13  DATE OF ADMISSION:  02/06/2011 DATE OF DISCHARGE:  02/09/2011                              DISCHARGE SUMMARY   ADDENDUM  This addendum is to be included in the admitting history and physical.  PHYSICAL EXAMINATION:  VITAL SIGNS:  Temperature 98.1, heart rate 66, respiratory rate 19, blood pressure 96/57, oxygen saturation 100% on room air. GENERAL:  No apparent distress, but tearful throughout interview.  Alert and oriented x3.  Pleasant, conversant, and cooperative to exam. HEENT:  Head is atraumatic and normocephalic.  Neck is supple without palpable masses or lymphadenopathy.  No JVD or carotid bruits.  Vision intact.  Extraocular movements intact.  Pupils equally round and reactive to light.  Sclerae anicteric.  Conjunctivae with slight pallor, no injection.  Mucous membranes are moist.  Oropharynx is without erythema, exudates, or other abnormal lesion. RESPIRATORY:  Lungs were clear to auscultation bilaterally with good air movement.  No wheezes, rhonchi or rubs. CARDIOVASCULAR:  Regular rate, normal rhythm.  Clear S1-S2.  No murmurs, gallops or rubs.  Pain is elicited with palpation of the sternum, bilateral costochondral joints, and left anterior chest wall. ABDOMEN:  Soft, nontender, nondistended.  Bowel sounds present in all quadrants and normoactive.  No palpable masses. RECTAL:  No external hemorrhoids noted.  No anal fissure, abnormal lesions, or external abnormality observed.  No internal hemorrhoids or palpable masses on the digital rectal exam.  Stool was light brown in color, no evidence of blood on exam.  Fecal occult blood test negative. EXTREMITIES:  Warm and dry.  Peripheral pulses equal, intact, and +2 globally.  No clubbing or cyanosis.  No  edema in bilateral lower extremities. SKIN:  Warm and dry with normal turgor.  No rashes or abnormal lesions observed. NEURO:  Cranial nerves II-XII grossly intact.  Muscle strength 5/5 in bilateral upper and lower extremities.  Sensation is grossly intact.  No focal deficits.    Deatra Robinson, MD   ______________________________ C. Ulyess Mort, M.D.   NK/MEDQ  D:  02/09/2011  T:  02/10/2011  Job:  [id number]  Electronically Signed by Deatra Robinson  on 02/16/2011 12:14:12 PM Electronically Signed by Eliezer Lofts M.D. on 02/19/2011 04:26:09 PM

## 2011-02-28 ENCOUNTER — Encounter: Payer: Self-pay | Admitting: Internal Medicine

## 2011-04-02 ENCOUNTER — Emergency Department (INDEPENDENT_AMBULATORY_CARE_PROVIDER_SITE_OTHER): Payer: Medicaid Other

## 2011-04-02 ENCOUNTER — Encounter (HOSPITAL_BASED_OUTPATIENT_CLINIC_OR_DEPARTMENT_OTHER): Payer: Self-pay

## 2011-04-02 ENCOUNTER — Emergency Department (HOSPITAL_BASED_OUTPATIENT_CLINIC_OR_DEPARTMENT_OTHER)
Admission: EM | Admit: 2011-04-02 | Discharge: 2011-04-03 | Disposition: A | Discharge status: Home / Self Care | Payer: Medicaid Other | Attending: Emergency Medicine | Admitting: Emergency Medicine

## 2011-04-02 DIAGNOSIS — R062 Wheezing: Secondary | ICD-10-CM

## 2011-04-02 DIAGNOSIS — H109 Unspecified conjunctivitis: Secondary | ICD-10-CM

## 2011-04-02 DIAGNOSIS — J40 Bronchitis, not specified as acute or chronic: Secondary | ICD-10-CM | POA: Insufficient documentation

## 2011-04-02 DIAGNOSIS — R05 Cough: Secondary | ICD-10-CM

## 2011-04-02 DIAGNOSIS — R509 Fever, unspecified: Secondary | ICD-10-CM | POA: Insufficient documentation

## 2011-04-02 DIAGNOSIS — R079 Chest pain, unspecified: Secondary | ICD-10-CM

## 2011-04-02 DIAGNOSIS — R0989 Other specified symptoms and signs involving the circulatory and respiratory systems: Secondary | ICD-10-CM

## 2011-04-02 MED ORDER — ALBUTEROL SULFATE (5 MG/ML) 0.5% IN NEBU
5.0000 mg | INHALATION_SOLUTION | Freq: Once | RESPIRATORY_TRACT | Status: AC
  Administered 2011-04-02: 5 mg via RESPIRATORY_TRACT
  Filled 2011-04-02: qty 1

## 2011-04-02 MED ORDER — DIPHENHYDRAMINE HCL 25 MG PO CAPS
25.0000 mg | ORAL_CAPSULE | Freq: Four times a day (QID) | ORAL | Status: DC | PRN
  Administered 2011-04-02: 25 mg via ORAL
  Filled 2011-04-02 (×2): qty 1

## 2011-04-02 NOTE — ED Provider Notes (Signed)
History     CSN: [id number] Arrival date & time: 04/02/2011 10:06 PM  Chief Complaint  Patient presents with  . URI   HPI Comments: Pt states that she is having some right eye irritation today:pt states that she has tried otc cold medications without relief  Patient is a 40 y.o. female presenting with URI. The history is provided by the patient. No language interpreter was used.  URI The primary symptoms include fever, sore throat and cough. Primary symptoms do not include nausea or vomiting. The current episode started more than 1 week ago. This is a new problem. The problem has not changed since onset. Symptoms associated with the illness include chills and congestion. The illness is not associated with facial pain or sinus pressure.    History reviewed. No pertinent past medical history.  Past Surgical History  Procedure Date  . Cesarean section   . Knee surgery     No family history on file.  History  Substance Use Topics  . Smoking status: Never Smoker   . Smokeless tobacco: Not on file  . Alcohol Use: No    OB History    Grav Para Term Preterm Abortions TAB SAB Ect Mult Living                  Review of Systems  Constitutional: Positive for fever and chills.  HENT: Positive for congestion and sore throat. Negative for sinus pressure.   Respiratory: Positive for cough.   Gastrointestinal: Negative for nausea and vomiting.  All other systems reviewed and are negative.    Physical Exam  BP 110/61  Pulse 87  Temp(Src) 97.7 F (36.5 C) (Oral)  Resp 18  SpO2 100%  LMP 03/29/2011  Physical Exam  Nursing note and vitals reviewed. Constitutional: She is oriented to person, place, and time. She appears well-developed and well-nourished.  HENT:  Head: Normocephalic.  Right Ear: External ear normal.  Mouth/Throat: Oropharynx is clear and moist.  Eyes: EOM are normal. Pupils are equal, round, and reactive to light. Right eye exhibits discharge.     Cardiovascular: Normal rate and regular rhythm.   Pulmonary/Chest: Effort normal. She has wheezes.  Neurological: She is alert and oriented to person, place, and time.  Skin: Skin is warm and dry.    ED Course  Procedures  Dg Chest 2 View  04/02/2011  *RADIOLOGY REPORT*  Clinical Data: Mid chest pain, cough, congestion, wheezing.  CHEST - 2 VIEW  Comparison: 02/06/2011  Findings: Lungs are clear. No pleural effusion or pneumothorax. The cardiomediastinal contours are within normal limits. The visualized bones and soft tissues are without significant appreciable abnormality.  IMPRESSION: No acute cardiopulmonary process.  Original Report Authenticated By: Waneta Martins, M.D.    MDM Will treat for bronchitis and conjunctivitis      Teressa Lower, NP 04/03/11 0008

## 2011-04-02 NOTE — ED Notes (Signed)
C/o prod cough, runny nose x 2 weeks-c/o irritation to right eye today

## 2011-04-03 MED ORDER — ERYTHROMYCIN 5 MG/GM OP OINT
TOPICAL_OINTMENT | Freq: Three times a day (TID) | OPHTHALMIC | Status: DC
  Administered 2011-04-03: via OPHTHALMIC
  Filled 2011-04-03: qty 3.5

## 2011-04-03 MED ORDER — PREDNISONE 10 MG PO TABS: 20.0000 mg | ORAL_TABLET | Freq: Every day | ORAL | Status: AC

## 2011-04-03 MED ORDER — AZITHROMYCIN 250 MG PO TABS: 250.0000 mg | ORAL_TABLET | Freq: Every day | ORAL | Status: AC

## 2011-04-03 MED ORDER — ALBUTEROL SULFATE HFA 108 (90 BASE) MCG/ACT IN AERS: 2.0000 | INHALATION_SPRAY | RESPIRATORY_TRACT | Status: DC | PRN

## 2011-04-03 MED ORDER — ALBUTEROL SULFATE HFA 108 (90 BASE) MCG/ACT IN AERS
INHALATION_SPRAY | RESPIRATORY_TRACT | Status: AC
  Filled 2011-04-03: qty 6.7

## 2011-04-03 NOTE — Patient Instructions (Signed)
Instructed pt on the proper use of administering albuteral mdi via aerochamber pt tolerated well 
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
## 2011-04-06 ENCOUNTER — Telehealth: Payer: Self-pay | Admitting: Licensed Clinical Social Worker

## 2011-04-06 NOTE — ED Provider Notes (Signed)
Medical screening examination/treatment/procedure(s) were performed by non-physician practitioner and as supervising physician I was immediately available for consultation/collaboration.   Charles B. Bernette Mayers, MD 04/06/11 [phone]

## 2011-04-06 NOTE — Telephone Encounter (Signed)
Called patient for follow-up.  She is connected to [employer] for counseling and sees Rosey Bath. She had a counseling appmt on 8/29 and said that counseling was going well and she will be seen regularly there.

## 2011-04-25 ENCOUNTER — Emergency Department (HOSPITAL_COMMUNITY)
Admission: EM | Admit: 2011-04-25 | Discharge: 2011-04-25 | Disposition: A | Discharge status: Home / Self Care | Payer: Medicaid Other | Attending: Emergency Medicine | Admitting: Emergency Medicine

## 2011-04-25 DIAGNOSIS — T148XXA Other injury of unspecified body region, initial encounter: Secondary | ICD-10-CM | POA: Insufficient documentation

## 2011-04-25 DIAGNOSIS — S335XXA Sprain of ligaments of lumbar spine, initial encounter: Secondary | ICD-10-CM | POA: Insufficient documentation

## 2011-04-25 DIAGNOSIS — M549 Dorsalgia, unspecified: Secondary | ICD-10-CM | POA: Insufficient documentation

## 2011-04-25 DIAGNOSIS — Y92410 Unspecified street and highway as the place of occurrence of the external cause: Secondary | ICD-10-CM | POA: Insufficient documentation

## 2011-06-23 ENCOUNTER — Encounter (HOSPITAL_BASED_OUTPATIENT_CLINIC_OR_DEPARTMENT_OTHER): Payer: Self-pay | Admitting: Emergency Medicine

## 2011-06-23 ENCOUNTER — Emergency Department (HOSPITAL_BASED_OUTPATIENT_CLINIC_OR_DEPARTMENT_OTHER)
Admission: EM | Admit: 2011-06-23 | Discharge: 2011-06-23 | Disposition: A | Discharge status: Home / Self Care | Payer: Medicaid Other | Attending: Emergency Medicine | Admitting: Emergency Medicine

## 2011-06-23 DIAGNOSIS — H109 Unspecified conjunctivitis: Secondary | ICD-10-CM | POA: Insufficient documentation

## 2011-06-23 DIAGNOSIS — H5710 Ocular pain, unspecified eye: Secondary | ICD-10-CM | POA: Insufficient documentation

## 2011-06-23 MED ORDER — POLYMYXIN B-TRIMETHOPRIM 10000-0.1 UNIT/ML-% OP SOLN: 1.0000 [drp] | OPHTHALMIC | Status: AC

## 2011-06-23 MED ORDER — FLUORESCEIN SODIUM 1 MG OP STRP
ORAL_STRIP | OPHTHALMIC | Status: AC
  Filled 2011-06-23: qty 1

## 2011-06-23 MED ORDER — TETRACAINE HCL 0.5 % OP SOLN
1.0000 [drp] | Freq: Once | OPHTHALMIC | Status: DC
  Filled 2011-06-23: qty 2

## 2011-06-23 NOTE — ED Notes (Signed)
Bilateral eye irritation

## 2011-06-23 NOTE — ED Provider Notes (Signed)
History     CSN: [id number] Arrival date & time: 06/23/2011 12:41 PM   First MD Initiated Contact with Patient 06/23/11 1249      Chief Complaint  Patient presents with  . Eye Pain    Pt reports bilateral eye irritation x 1 week current contact lens wearer    (Consider location/radiation/quality/duration/timing/severity/associated sxs/prior treatment) HPI  History reviewed. No pertinent past medical history. Patient has been complaining of bilateral eye irritation for the last week. She does wear contact lenses but states she's only worn them a couple of days in the last couple weeks. Patient denies any crusting or drainage. She does feel there is rotated both eyes. Her vision has not been affected. Patient denies any trauma. There's no fevers. Nothing seems to make it better or worse particularly. The severity is mild Past Surgical History  Procedure Date  . Cesarean section   . Knee surgery   . Ceaserian     History reviewed. No pertinent family history.  History  Substance Use Topics  . Smoking status: Never Smoker   . Smokeless tobacco: Not on file  . Alcohol Use: No    OB History    Grav Para Term Preterm Abortions TAB SAB Ect Mult Living                  Review of Systems  All other systems reviewed and are negative.    Allergies  Penicillins  Home Medications   Current Outpatient Rx  Name Route Sig Dispense Refill  . DOCUSATE SODIUM 100 MG PO CAPS Oral Take 1 capsule (100 mg total) by mouth 2 (two) times daily. 60 capsule 6  . FERROUS SULFATE 325 (65 FE) MG PO TBEC Oral Take 1 tablet (325 mg total) by mouth 3 (three) times daily with meals. 90 tablet 6  . OMEPRAZOLE 20 MG PO CPDR Oral Take 1 capsule (20 mg total) by mouth daily. 30 capsule 6    BP 120/60  Pulse 77  Temp(Src) 98 F (36.7 C) (Oral)  Resp 22  SpO2 100%  LMP 06/23/2011  Physical Exam  Nursing note and vitals reviewed. Constitutional: She appears well-developed and  well-nourished. No distress.  HENT:  Head: Normocephalic and atraumatic.  Right Ear: External ear normal.  Left Ear: External ear normal.  Eyes: Conjunctivae, EOM and lids are normal. Pupils are equal, round, and reactive to light. Right eye exhibits no chemosis and no discharge. Left eye exhibits no chemosis and no discharge. No scleral icterus.  Slit lamp exam:      The right eye shows no corneal ulcer and no fluorescein uptake.       The left eye shows no corneal ulcer and no fluorescein uptake.       No slit lamp was used, Wood's light was used to assess for ulceration  Neck: Neck supple. No tracheal deviation present.  Cardiovascular: Normal rate.   Pulmonary/Chest: Effort normal. No stridor. No respiratory distress.  Musculoskeletal: She exhibits no edema.  Neurological: She is alert. Cranial nerve deficit: no gross deficits.  Skin: Skin is warm and dry. No rash noted.  Psychiatric: She has a normal mood and affect.    ED Course  Procedures (including critical care time)  Labs Reviewed - No data to display No results found.    MDM  Patient has mild eye irritation. There does not appear to be any evidence of coronary ulceration. However the patient discontinued her contact lens use. She will also will avoid  any eye makeup. I will prescribe her course her monitor and have her followup with an eye doctor next week to be rechecked   Diagnosis: Conjunctivitis     Celene Kras, MD 06/23/11 1313

## 2011-08-06 ENCOUNTER — Encounter (HOSPITAL_BASED_OUTPATIENT_CLINIC_OR_DEPARTMENT_OTHER): Payer: Self-pay | Admitting: Emergency Medicine

## 2011-08-06 ENCOUNTER — Emergency Department (HOSPITAL_BASED_OUTPATIENT_CLINIC_OR_DEPARTMENT_OTHER)
Admission: EM | Admit: 2011-08-06 | Discharge: 2011-08-06 | Disposition: A | Discharge status: Home / Self Care | Payer: Medicaid Other | Attending: Emergency Medicine | Admitting: Emergency Medicine

## 2011-08-06 DIAGNOSIS — S0083XA Contusion of other part of head, initial encounter: Secondary | ICD-10-CM

## 2011-08-06 DIAGNOSIS — S0003XA Contusion of scalp, initial encounter: Secondary | ICD-10-CM | POA: Insufficient documentation

## 2011-08-06 DIAGNOSIS — M549 Dorsalgia, unspecified: Secondary | ICD-10-CM | POA: Insufficient documentation

## 2011-08-06 DIAGNOSIS — M542 Cervicalgia: Secondary | ICD-10-CM | POA: Insufficient documentation

## 2011-08-06 DIAGNOSIS — S1093XA Contusion of unspecified part of neck, initial encounter: Secondary | ICD-10-CM | POA: Insufficient documentation

## 2011-08-06 MED ORDER — IBUPROFEN 800 MG PO TABS: 800.0000 mg | ORAL_TABLET | Freq: Three times a day (TID) | ORAL | Status: AC

## 2011-08-06 MED ORDER — HYDROCODONE-ACETAMINOPHEN 5-325 MG PO TABS: 2.0000 | ORAL_TABLET | ORAL | Status: AC | PRN

## 2011-08-06 MED ORDER — IBUPROFEN 800 MG PO TABS: 800.0000 mg | ORAL_TABLET | Freq: Three times a day (TID) | ORAL | Status: DC

## 2011-08-06 MED ORDER — HYDROCODONE-ACETAMINOPHEN 5-325 MG PO TABS
2.0000 | ORAL_TABLET | Freq: Once | ORAL | Status: AC
  Administered 2011-08-06: 2 via ORAL
  Filled 2011-08-06: qty 2
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
## 2011-08-07 NOTE — ED Provider Notes (Signed)
History/physical exam/procedure(s) were performed by non-physician practitioner and as supervising physician I was immediately available for consultation/collaboration. I have reviewed all notes and am in agreement with care and plan.   Hilario Quarry, MD 08/07/11 [phone]

## 2011-09-03 ENCOUNTER — Encounter (HOSPITAL_BASED_OUTPATIENT_CLINIC_OR_DEPARTMENT_OTHER): Payer: Self-pay | Admitting: *Deleted

## 2011-09-03 ENCOUNTER — Emergency Department (HOSPITAL_BASED_OUTPATIENT_CLINIC_OR_DEPARTMENT_OTHER)
Admission: EM | Admit: 2011-09-03 | Discharge: 2011-09-03 | Discharge status: Against Medical Advice | Payer: Medicaid Other | Attending: Emergency Medicine | Admitting: Emergency Medicine

## 2011-09-03 DIAGNOSIS — H9210 Otorrhea, unspecified ear: Secondary | ICD-10-CM | POA: Insufficient documentation

## 2011-09-03 NOTE — ED Notes (Signed)
C/o drainage from right ear x 2 weeks

## 2011-09-03 NOTE — ED Notes (Signed)
Pt called to room multiple times without answer. Pt not found in waiting room, restrooms or surrounding vicinity. Pt not found in room.

## 2011-09-04 ENCOUNTER — Encounter (HOSPITAL_BASED_OUTPATIENT_CLINIC_OR_DEPARTMENT_OTHER): Payer: Self-pay | Admitting: *Deleted

## 2011-09-04 ENCOUNTER — Emergency Department (HOSPITAL_BASED_OUTPATIENT_CLINIC_OR_DEPARTMENT_OTHER)
Admission: EM | Admit: 2011-09-04 | Discharge: 2011-09-04 | Disposition: A | Discharge status: Home / Self Care | Payer: Medicaid Other | Attending: Emergency Medicine | Admitting: Emergency Medicine

## 2011-09-04 DIAGNOSIS — J069 Acute upper respiratory infection, unspecified: Secondary | ICD-10-CM | POA: Insufficient documentation

## 2011-09-04 DIAGNOSIS — R059 Cough, unspecified: Secondary | ICD-10-CM | POA: Insufficient documentation

## 2011-09-04 DIAGNOSIS — H60399 Other infective otitis externa, unspecified ear: Secondary | ICD-10-CM | POA: Insufficient documentation

## 2011-09-04 DIAGNOSIS — R05 Cough: Secondary | ICD-10-CM | POA: Insufficient documentation

## 2011-09-04 DIAGNOSIS — J3489 Other specified disorders of nose and nasal sinuses: Secondary | ICD-10-CM | POA: Insufficient documentation

## 2011-09-04 DIAGNOSIS — H6090 Unspecified otitis externa, unspecified ear: Secondary | ICD-10-CM

## 2011-09-04 MED ORDER — NEOMYCIN-POLYMYXIN-HC 3.5-10000-1 OT SOLN
3.0000 [drp] | Freq: Four times a day (QID) | OTIC | Status: DC
  Administered 2011-09-04: 3 [drp] via OTIC
  Filled 2011-09-04: qty 10

## 2011-09-04 NOTE — ED Provider Notes (Signed)
Medical screening examination/treatment/procedure(s) were performed by non-physician practitioner and as supervising physician I was immediately available for consultation/collaboration.   Jamarion [place] A. Rhenda [place], MD 09/04/11 1724 

## 2011-09-04 NOTE — ED Notes (Signed)
Pt amb to triage with quick steady gait in nad. Pt reports 2 weeks of right ear congestion and one week of cough. Denies any fevers or other c/o.

## 2011-09-04 NOTE — ED Notes (Signed)
Patient drinking sprite.  

## 2011-09-04 NOTE — ED Provider Notes (Signed)
History     CSN: [id number]  Arrival date & time 09/04/11  1523   First MD Initiated Contact with Patient 09/04/11 1606      Chief Complaint  Patient presents with  . Cough  . Nasal Congestion    (Consider location/radiation/quality/duration/timing/severity/associated sxs/prior treatment) Patient is a 41 y.o. female presenting with cough. The history is provided by the patient.  Cough This is a new problem. The current episode started more than 1 week ago. The problem occurs constantly. Pertinent negatives include no chills. Associated symptoms comments: Sinus pressure, right ear malodorous drainage without pain. Symptoms longer than one week in duration.Marland Kitchen    History reviewed. No pertinent past medical history.  Past Surgical History  Procedure Date  . Cesarean section   . Knee surgery   . Ceaserian     History reviewed. No pertinent family history.  History  Substance Use Topics  . Smoking status: Never Smoker   . Smokeless tobacco: Not on file  . Alcohol Use: No    OB History    Grav Para Term Preterm Abortions TAB SAB Ect Mult Living                  Review of Systems  Constitutional: Negative for fever and chills.  HENT: Positive for congestion and ear discharge.   Respiratory: Positive for cough.   Cardiovascular: Negative.   Gastrointestinal: Negative.   Musculoskeletal: Negative.   Skin: Negative.   Neurological: Negative.     Allergies  Penicillins  Home Medications  No current outpatient prescriptions on file.  BP 117/61  Pulse 76  Temp(Src) 99 F (37.2 C) (Oral)  Resp 16  Ht 5\' 7"  (1.702 m)  Wt 240 lb (108.863 kg)  BMI 37.59 kg/m2  SpO2 100%  LMP 08/08/2011  Physical Exam  Constitutional: She appears well-developed and well-nourished.  HENT:  Head: Normocephalic.  Nose: Mucosal edema present.  Mouth/Throat: Mucous membranes are normal. Posterior oropharyngeal erythema present. No posterior oropharyngeal edema.       Right ear  external canal with purulent material. TM obscurred.  Neck: Normal range of motion. Neck supple.  Cardiovascular: Normal rate and regular rhythm.   Pulmonary/Chest: Effort normal and breath sounds normal.  Abdominal: Soft. Bowel sounds are normal. There is no tenderness. There is no rebound and no guarding.  Musculoskeletal: Normal range of motion.  Neurological: She is alert. No cranial nerve deficit.  Skin: Skin is warm and dry. No rash noted.  Psychiatric: She has a normal mood and affect.    ED Course  Procedures (including critical care time)  Labs Reviewed - No data to display No results found.   No diagnosis found.    MDM          Rodena Medin, PA-C 09/04/11 1658

## 2011-10-01 ENCOUNTER — Encounter (HOSPITAL_BASED_OUTPATIENT_CLINIC_OR_DEPARTMENT_OTHER): Payer: Self-pay

## 2011-10-01 ENCOUNTER — Emergency Department (HOSPITAL_BASED_OUTPATIENT_CLINIC_OR_DEPARTMENT_OTHER)
Admission: EM | Admit: 2011-10-01 | Discharge: 2011-10-01 | Disposition: A | Discharge status: Home / Self Care | Payer: Medicaid Other | Attending: Emergency Medicine | Admitting: Emergency Medicine

## 2011-10-01 DIAGNOSIS — L259 Unspecified contact dermatitis, unspecified cause: Secondary | ICD-10-CM | POA: Insufficient documentation

## 2011-10-01 MED ORDER — PREDNISONE 50 MG PO TABS
60.0000 mg | ORAL_TABLET | Freq: Once | ORAL | Status: AC
  Administered 2011-10-01: 60 mg via ORAL
  Filled 2011-10-01: qty 1

## 2011-10-01 MED ORDER — PREDNISONE 10 MG PO TABS: 20.0000 mg | ORAL_TABLET | Freq: Every day | ORAL | Status: DC

## 2011-10-01 NOTE — ED Provider Notes (Signed)
History   Scribed for Karen Quarry, MD, the patient was seen in MHT13/MHT13. The chart was scribed by Gilman Schmidt. The patients care was started at 6:36 PM.   CSN: [id number]  Arrival date & time 10/01/11  1806   First MD Initiated Contact with Patient 10/01/11 1825      Chief Complaint  Patient presents with  . Rash    (Consider location/radiation/quality/duration/timing/severity/associated sxs/prior treatment) HPI [name] is a 41 y.o. female who presents to the Emergency Department complaining of painful rash on neck onset one week. Pt reports using Benadryl cream with no relief. Denies any use of new hair dye or makeup. Notes new necklace. There are no other associated symptoms and no other alleviating or aggravating factors.     History reviewed. No pertinent past medical history.  Past Surgical History  Procedure Date  . Cesarean section   . Knee surgery   . Ceaserian     No family history on file.  History  Substance Use Topics  . Smoking status: Never Smoker   . Smokeless tobacco: Not on file  . Alcohol Use: No    OB History    Grav Para Term Preterm Abortions TAB SAB Ect Mult Living                  Review of Systems  Skin: Positive for rash.  All other systems reviewed and are negative.    Allergies  Penicillins  Home Medications   Current Outpatient Rx  Name Route Sig Dispense Refill  . IBUPROFEN 100 MG PO TABS Oral Take 800 mg by mouth every 6 (six) hours as needed. For pain      BP 111/63  Pulse 87  Temp(Src) 98.3 F (36.8 C) (Oral)  Resp 20  Ht 5\' 7"  (1.702 m)  Wt 240 lb (108.863 kg)  BMI 37.59 kg/m2  SpO2 100%  LMP 09/11/2011  Physical Exam  Constitutional: She appears well-developed and well-nourished.  HENT:  Head: Normocephalic and atraumatic.  Eyes: Conjunctivae are normal. Pupils are equal, round, and reactive to light.  Neck: Neck supple. No tracheal deviation present. No thyromegaly present.       Contact  dermatitis   Cardiovascular: Normal rate and regular rhythm.   No murmur heard. Pulmonary/Chest: Effort normal and breath sounds normal.  Abdominal: Soft. Bowel sounds are normal. She exhibits no distension. There is no tenderness.  Musculoskeletal: Normal range of motion. She exhibits no edema and no tenderness.  Neurological: She is alert. Coordination normal.  Skin: Skin is warm and dry. No rash noted.  Psychiatric: She has a normal mood and affect.    ED Course  Procedures (including critical care time)  Labs Reviewed - No data to display No results found.   No diagnosis found.  DIAGNOSTIC STUDIES: Oxygen Saturation is 100% on room air, normal by my interpretation.    COORDINATION OF CARE: 6:36pm:  - Patient evaluated by ED physician, and given Prednisone as Discharge Orderd     MDM  I personally performed the services described in this documentation, which was scribed in my presence. The recorded information has been reviewed and considered.    Karen Quarry, MD 10/03/11 [phone]

## 2011-10-01 NOTE — Discharge Instructions (Signed)
Contact Dermatitis Contact dermatitis is a rash that happens when something touches the skin. You touched something that irritates your skin, or you have allergies to something you touched. HOME CARE   Avoid the thing that caused your rash.   Keep your rash away from hot water, soap, sunlight, chemicals, and other things that might bother it.   Do not scratch your rash.   You can take cool baths to help stop itching.   Only take medicine as told by your doctor.   Keep all doctor visits as told.  GET HELP RIGHT AWAY IF:   Your rash is not better after 3 days.   Your rash gets worse.   Your rash is puffy (swollen), tender, red, sore, or warm.   You have problems with your medicine.  MAKE SURE YOU:   Understand these instructions.   Will watch your condition.   Will get help right away if you are not doing well or get worse.  Document Released: 05/20/2009 Document Revised: 04/04/2011 Document Reviewed: 12/26/2010 ExitCare Patient Information 2012 [employer]. 

## 2011-10-01 NOTE — ED Notes (Signed)
Painful reds, raised rash on neck x 1 week unrelieved after using Benadryl.

## 2011-10-08 IMAGING — CR DG ANKLE COMPLETE 3+V*L*
3 series · 3 of 3 positions shown · non-contrast
Comparison: None.

CLINICAL DATA: Twisted ankle.  Pain all over.

LEFT ANKLE COMPLETE - 3+ VIEW

[t ankle joint ap left]
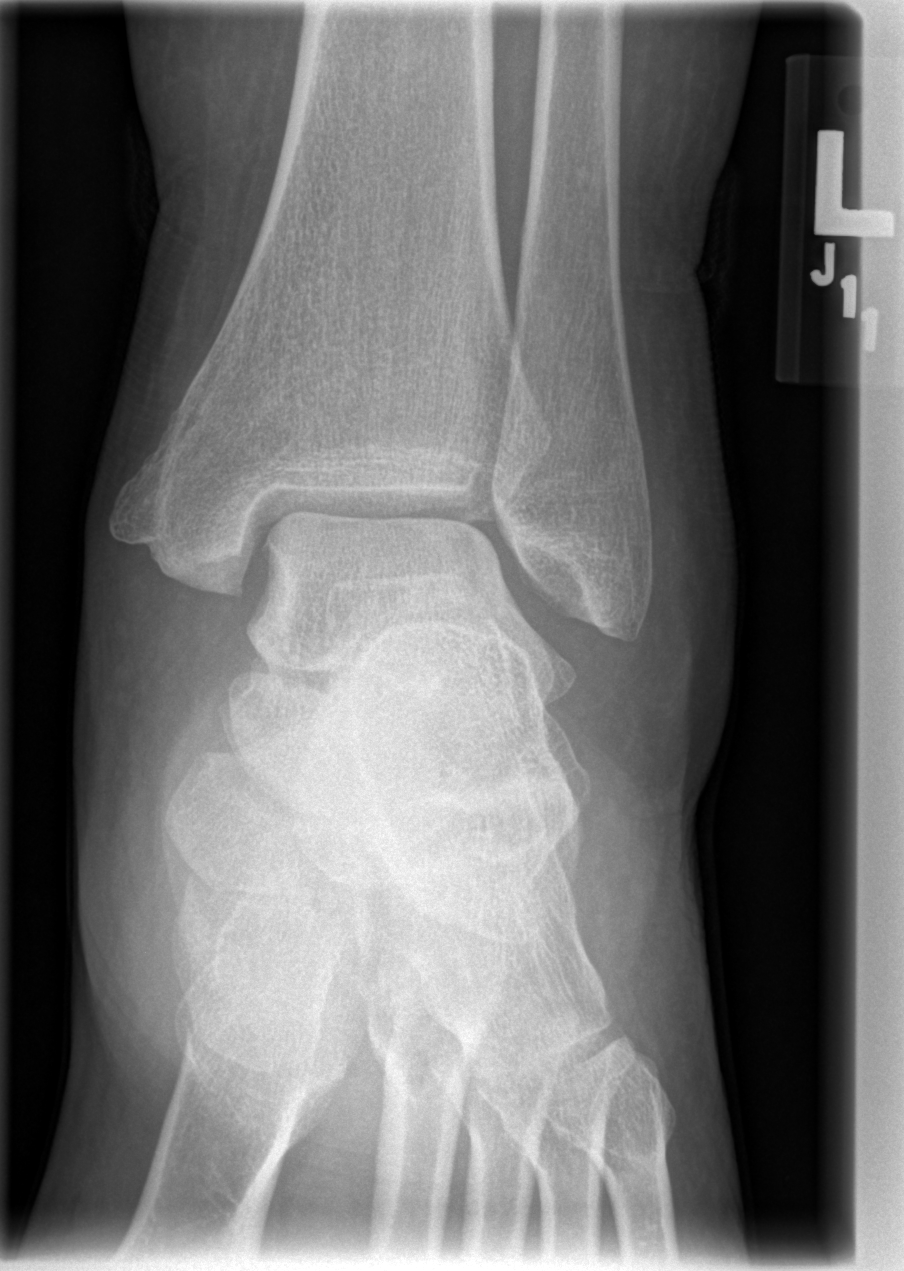

[t ankle joint oblique left]
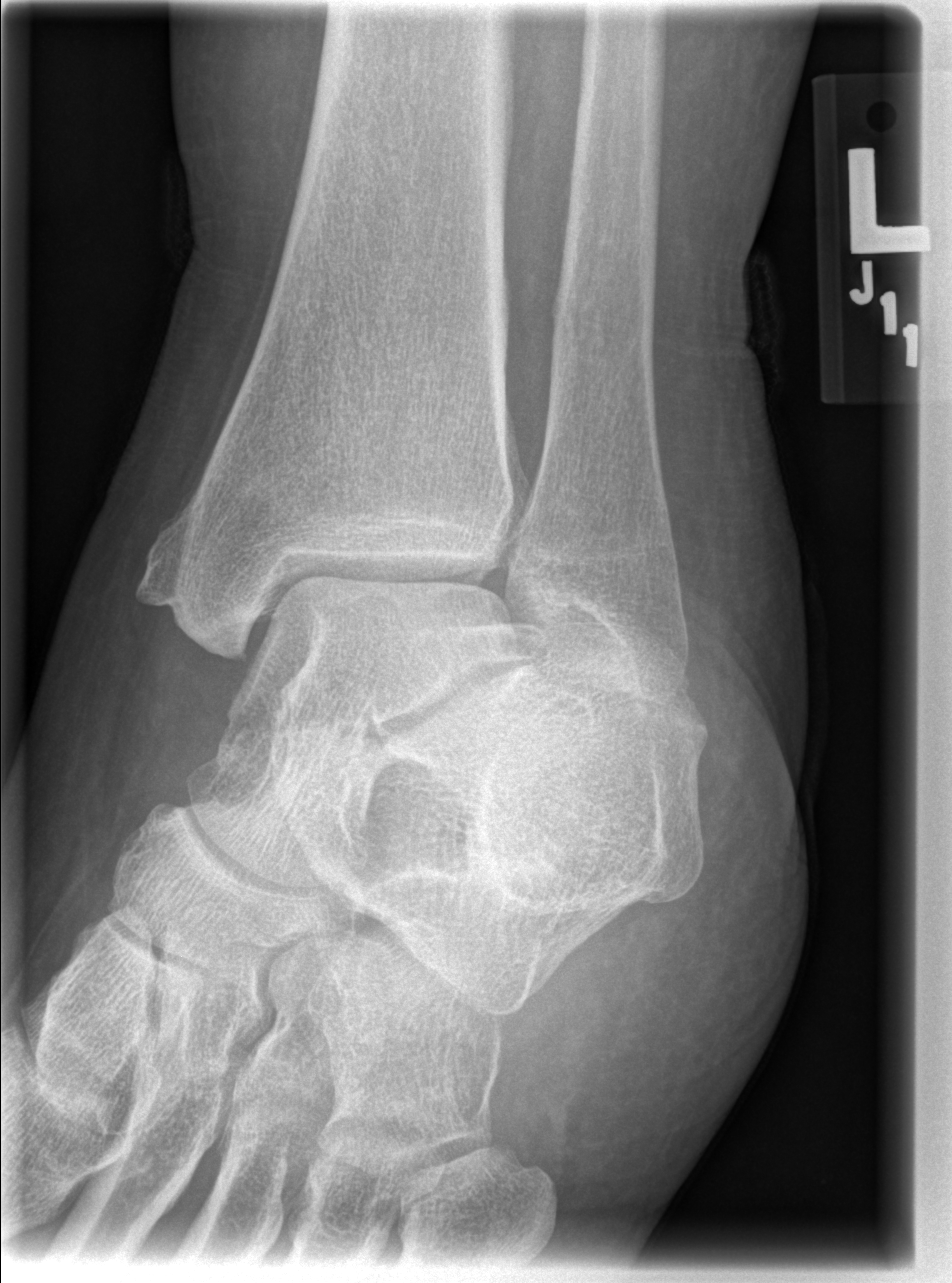

[t ankle joint lat left]
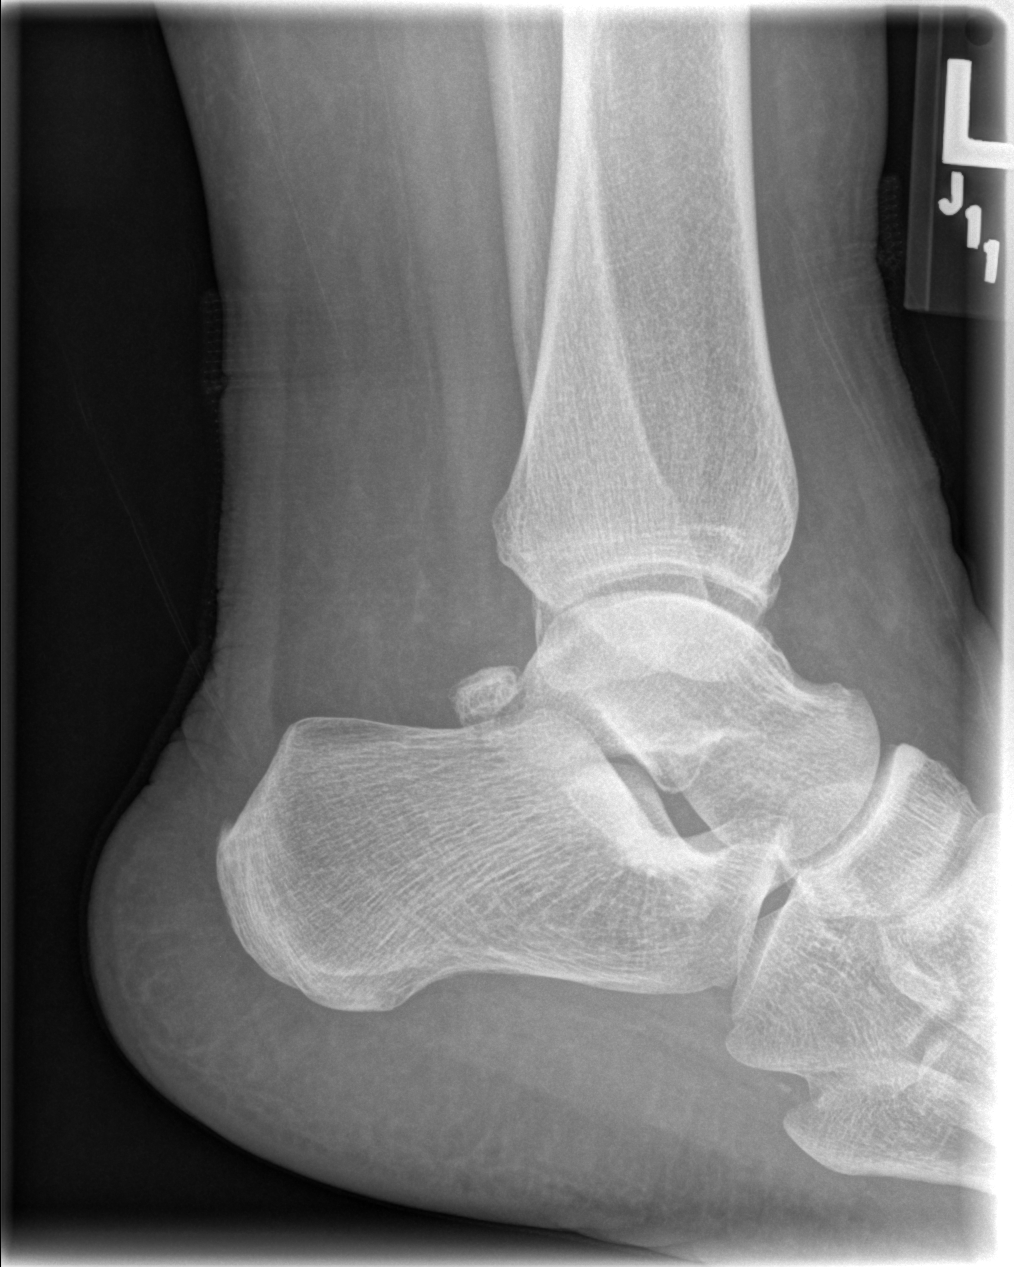

[3 of 3 positions shown; findings below may reference images not displayed]

FINDINGS: Diffuse soft tissue swelling. No evidence of acute
fracture or subluxation.  No focal bone lesions.  Bone matrix and
cortex appear intact.  No abnormal radiopaque densities in the soft
tissues.  The ankle mortis and talar dome appear intact.  Prominent
accessory os trigonum.
IMPRESSION: Soft tissue swelling.  No acute bony abnormalities demonstrated.

## 2011-10-10 ENCOUNTER — Encounter (HOSPITAL_COMMUNITY): Payer: Self-pay | Admitting: Emergency Medicine

## 2011-10-10 ENCOUNTER — Emergency Department (HOSPITAL_COMMUNITY)
Admission: EM | Admit: 2011-10-10 | Discharge: 2011-10-10 | Disposition: A | Discharge status: Home / Self Care | Payer: Medicaid Other | Attending: Emergency Medicine | Admitting: Emergency Medicine

## 2011-10-10 DIAGNOSIS — Z880 Allergy status to penicillin: Secondary | ICD-10-CM | POA: Insufficient documentation

## 2011-10-10 DIAGNOSIS — B9789 Other viral agents as the cause of diseases classified elsewhere: Secondary | ICD-10-CM | POA: Insufficient documentation

## 2011-10-10 DIAGNOSIS — R011 Cardiac murmur, unspecified: Secondary | ICD-10-CM | POA: Insufficient documentation

## 2011-10-10 DIAGNOSIS — B349 Viral infection, unspecified: Secondary | ICD-10-CM

## 2011-10-10 HISTORY — DX: Cardiac murmur, unspecified: R01.1

## 2011-10-10 MED ORDER — HYDROCODONE-HOMATROPINE 5-1.5 MG/5ML PO SYRP: 5.0000 mL | ORAL_SOLUTION | Freq: Four times a day (QID) | ORAL | Status: AC | PRN

## 2011-10-10 MED ORDER — ONDANSETRON 4 MG PO TBDP: 4.0000 mg | ORAL_TABLET | Freq: Three times a day (TID) | ORAL | Status: AC | PRN

## 2011-10-10 MED ORDER — IBUPROFEN 800 MG PO TABS: 800.0000 mg | ORAL_TABLET | Freq: Three times a day (TID) | ORAL | Status: DC

## 2011-10-10 MED ORDER — LOPERAMIDE HCL 2 MG PO CAPS: 2.0000 mg | ORAL_CAPSULE | Freq: Four times a day (QID) | ORAL | Status: AC | PRN

## 2011-10-10 NOTE — ED Notes (Signed)
Pt states she has cold sxs, diarrhea, nausea, runny nose, cough  Denies fever  Sxs started about 4 days ago

## 2011-10-10 NOTE — Discharge Instructions (Signed)
Your symptoms are likely viral in nature. We will treat your symptoms. Use the medications as needed. Return to the ER for worsening condition, high fever, or other worrisome symptoms.  RESOURCE GUIDE  Dental Problems  Patients with Medicaid: [facility] [phone] [address]                                           [phone] Phone:  [phone]                                                  Phone:  [phone]  If unable to pay or uninsured, contact:  [facility] or [facility] to become qualified for the adult dental clinic.  Chronic Pain Problems Contact [facility]  [phone] Patients need to be referred by their primary care doctor.  Insufficient Money for Medicine Contact United Way:  call "211" or [facility] [phone].  No Primary Care Doctor Call [facility]  [phone] Other agencies that provide inexpensive medical care    [facility]  [phone]    [facility]  [phone]    [facility]  [phone]    [facility]  [phone]    [facility]  [phone]    [facility]  [phone]  Psychological Services [facility]  [phone] [facility]  [phone] [facility]   [phone] (emergency services [phone])  Substance Abuse Resources [facility]  [phone] [facility] [phone] [facility] [place] [phone] Floydene Flock [phone] Residential & Outpatient Substance Abuse Program  [phone]  Abuse/Neglect [facility] Child Abuse Hotline 380-432-9608 [facility] Child Abuse Hotline 310-678-4903 (After Hours)  Emergency Shelter [facility] [phone])633-6882  Maternity Homes Room at the [place] [facility] [place] [phone] [facility] (714)[phone]  MRSA Hotline #:   817-680-8397    [facility] Resources  [facility] [place]     United Way                          [facility] Dept. [address] [place]                       [address]      [address]  [place]                                                Cristobal Goldmann Phone:  [phone]  Phone:  [phone]                 Phone:  [phone]  [facility] Phone:  [phone]  [facility] Child Abuse Hotline 430-213-3698 [phone] (After Hours)  Antibiotic Nonuse  Your caregiver felt that the infection or problem was not one that would be helped with an antibiotic. Infections may be caused by viruses or bacteria. Only a caregiver can tell which one of these is the likely cause of an illness. A cold is the most common cause of infection in both adults and children. A cold is a virus. Antibiotic treatment will have no effect on a viral infection. Viruses can lead to many lost days of work caring for sick children and many missed days of school. Children may catch as many as 10 "colds" or "flus" per year during which they can be tearful, cranky, and uncomfortable. The goal of treating a virus is aimed at keeping the ill person comfortable. Antibiotics are medications used to help the body fight bacterial infections. There are relatively few types of bacteria that cause infections but there are hundreds of viruses. While both viruses and bacteria cause infection they are very different types of germs. A viral infection will typically go away by itself within 7 to 10 days. Bacterial infections may spread or get worse without antibiotic treatment. Examples of bacterial infections are:  Sore throats (like strep throat or tonsillitis).   Infection in the lung (pneumonia).   Ear and skin infections.  Examples of viral infections are:  Colds or flus.   Most coughs and bronchitis.   Sore throats  not caused by Strep.   Runny noses.  It is often best not to take an antibiotic when a viral infection is the cause of the problem. Antibiotics can kill off the helpful bacteria that we have inside our body and allow harmful bacteria to start growing. Antibiotics can cause side effects such as allergies, nausea, and diarrhea without helping to improve the symptoms of the viral infection. Additionally, repeated uses of antibiotics can cause bacteria inside of our body to become resistant. That resistance can be passed onto harmful bacterial. The next time you have an infection it may be harder to treat if antibiotics are used when they are not needed. Not treating with antibiotics allows our own immune system to develop and take care of infections more efficiently. Also, antibiotics will work better for [place] when they are prescribed for bacterial infections. Treatments for a child that is ill may include:  Give extra fluids throughout the day to stay hydrated.   Get plenty of rest.   Only give your child over-the-counter or prescription medicines for pain, discomfort, or fever as directed by your caregiver.   The use of a cool mist humidifier may help stuffy noses.   Cold medications if suggested by your caregiver.  Your caregiver may decide to start you on an antibiotic if:  The problem you were seen for today continues for a longer length of time than expected.   You develop a secondary bacterial infection.  SEEK MEDICAL CARE IF:  Fever lasts longer than 5 days.   Symptoms continue to get worse after 5 to 7 days or become severe.   Difficulty in breathing develops.   Signs of dehydration develop (poor drinking, rare urinating, dark colored urine).   Changes in behavior or worsening tiredness (listlessness or lethargy).  Document Released: 10/01/2001 Document Revised: 07/12/2011 Document Reviewed: 03/30/2009  ExitCare Patient Information 2012 [place], [place].Cough, Adult  A cough is a  reflex that helps clear your throat and airways. It can help heal the body or may be a reaction to an irritated airway. A cough may only last 2 or 3 weeks (acute) or may last more than 8 weeks (chronic).  CAUSES Acute cough:  Viral or bacterial infections.  Chronic cough:  Infections.   Allergies.   Asthma.   Post-nasal drip.   Smoking.   Heartburn or acid reflux.   Some medicines.   Chronic lung problems (COPD).   Cancer.  SYMPTOMS   Cough.   Fever.   Chest pain.   Increased breathing rate.   High-pitched whistling sound when breathing (wheezing).   Colored mucus that you cough up (sputum).  TREATMENT   A bacterial cough may be treated with antibiotic medicine.   A viral cough must run its course and will not respond to antibiotics.   Your caregiver may recommend other treatments if you have a chronic cough.  HOME CARE INSTRUCTIONS   Only take over-the-counter or prescription medicines for pain, discomfort, or fever as directed by your caregiver. Use cough suppressants only as directed by your caregiver.   Use a cold steam vaporizer or humidifier in your bedroom or home to help loosen secretions.   Sleep in a semi-upright position if your cough is worse at night.   Rest as needed.   Stop smoking if you smoke.  SEEK IMMEDIATE MEDICAL CARE IF:   You have pus in your sputum.   Your cough starts to worsen.   You cannot control your cough with suppressants and are losing sleep.   You begin coughing up blood.   You have difficulty breathing.   You develop pain which is getting worse or is uncontrolled with medicine.   You have a fever.  MAKE SURE YOU:   Understand these instructions.   Will watch your condition.   Will get help right away if you are not doing well or get worse.  Document Released: 01/19/2011 Document Revised: 07/12/2011 Document Reviewed: 01/19/2011 [facility] Patient Information 2012 [place], [place].

## 2011-10-10 NOTE — ED Provider Notes (Signed)
History     CSN: [id number]  Arrival date & time 10/10/11  1905   First MD Initiated Contact with Patient 10/10/11 1939      Chief Complaint  Patient presents with  . Cough    pt c/o cough, runny nose, sneezing, nasal burning and pain to sinus area x's 4 days.     (Consider location/radiation/quality/duration/timing/severity/associated sxs/prior treatment) The history is provided by the patient.   41 year old female presents to the ED with 4 days of nausea, diarrhea, non productive cough, rhinorrhea, and headache. She has taken Tylenol Sinus with minimal relief. Denies fever or chills. Has had no episodes of emesis or abdominal pain. Denies chest pain or shortness of breath or diaphoresis. She states that one of her friends has recently been sick with the flu. She did not get her flu shot this year.  Past Medical History  Diagnosis Date  . Heart murmur     Past Surgical History  Procedure Date  . Cesarean section   . Knee surgery   . Ceaserian   . Cesarean section   . Knee surgery     Family History  Problem Relation Age of Onset  . Hypertension Other   . Cancer Other     History  Substance Use Topics  . Smoking status: Never Smoker   . Smokeless tobacco: Not on file  . Alcohol Use: No    OB History    Grav Para Term Preterm Abortions TAB SAB Ect Mult Living                  Review of Systems  Constitutional: Negative for fever, chills, activity change and appetite change.  HENT: Positive for congestion. Negative for ear pain, sore throat, facial swelling, trouble swallowing and neck pain.   Eyes: Negative for photophobia, discharge and visual disturbance.  Respiratory: Negative for chest tightness and shortness of breath.   Cardiovascular: Negative for chest pain and palpitations.  Gastrointestinal: Negative for nausea, vomiting and abdominal pain.  Musculoskeletal: Negative for myalgias.  Skin: Negative for color change and rash.  Neurological: Positive  for headaches. Negative for dizziness and weakness.    Allergies  Penicillins  Home Medications  No current outpatient prescriptions on file.  BP 121/72  Pulse 88  Temp(Src) 98 F (36.7 C) (Oral)  Resp 18  SpO2 99%  LMP 10/03/2011  Physical Exam  Nursing note and vitals reviewed. Constitutional: She is oriented to person, place, and time. She appears well-developed and well-nourished. No distress.  HENT:  Head: Normocephalic and atraumatic.  Right Ear: External ear normal.  Left Ear: External ear normal.       Tympanic membranes normal bilaterally. There is no tenderness to palpation of the sinuses.  Eyes: Conjunctivae are normal.  Neck: Normal range of motion. Neck supple.  Cardiovascular: Normal rate, regular rhythm and normal heart sounds.   Pulmonary/Chest: Effort normal and breath sounds normal. She has no wheezes. She exhibits no tenderness.  Abdominal: Soft. Bowel sounds are normal. There is no tenderness. There is no rebound and no guarding.  Musculoskeletal: Normal range of motion.  Lymphadenopathy:    She has no cervical adenopathy.  Neurological: She is alert and oriented to person, place, and time.  Skin: Skin is warm and dry. She is not diaphoretic.  Psychiatric: She has a normal mood and affect.    ED Course  Procedures (including critical care time)  Labs Reviewed - No data to display No results found.   1.  Viral disease       MDM  Patient with 4 days of nausea, diarrhea, rhinorrhea, cough, headache. Her vital signs are within normal limits. Her physical exam is unremarkable. Suspect likely viral process. We'll treat symptomatically. Return precautions discussed.        Grant Fontana, [place] 10/11/11 [phone]

## 2011-10-12 ENCOUNTER — Encounter (HOSPITAL_BASED_OUTPATIENT_CLINIC_OR_DEPARTMENT_OTHER): Payer: Self-pay | Admitting: *Deleted

## 2011-10-12 ENCOUNTER — Emergency Department (HOSPITAL_BASED_OUTPATIENT_CLINIC_OR_DEPARTMENT_OTHER)
Admission: EM | Admit: 2011-10-12 | Discharge: 2011-10-12 | Disposition: A | Discharge status: Home / Self Care | Payer: Medicaid Other | Attending: Emergency Medicine | Admitting: Emergency Medicine

## 2011-10-12 DIAGNOSIS — N390 Urinary tract infection, site not specified: Secondary | ICD-10-CM

## 2011-10-12 DIAGNOSIS — R300 Dysuria: Secondary | ICD-10-CM | POA: Insufficient documentation

## 2011-10-12 LAB — URINALYSIS, ROUTINE W REFLEX MICROSCOPIC
Glucose, UA: NEGATIVE mg/dL
Ketones, ur: 15 mg/dL — AB
Protein, ur: >300 mg/dL — AB
Urobilinogen, UA: 1 mg/dL (ref 0.0–1.0)

## 2011-10-12 LAB — URINE MICROSCOPIC-ADD ON

## 2011-10-12 MED ORDER — HYDROCODONE-ACETAMINOPHEN 5-325 MG PO TABS: 2.0000 | ORAL_TABLET | ORAL | Status: AC | PRN

## 2011-10-12 MED ORDER — HYDROCODONE-ACETAMINOPHEN 5-325 MG PO TABS
2.0000 | ORAL_TABLET | Freq: Once | ORAL | Status: AC
  Administered 2011-10-12: 2 via ORAL
  Filled 2011-10-12: qty 2

## 2011-10-12 MED ORDER — PHENAZOPYRIDINE HCL 200 MG PO TABS: 200.0000 mg | ORAL_TABLET | Freq: Three times a day (TID) | ORAL | Status: AC

## 2011-10-12 MED ORDER — CIPROFLOXACIN HCL 500 MG PO TABS: 500.0000 mg | ORAL_TABLET | Freq: Two times a day (BID) | ORAL | Status: AC

## 2011-10-12 NOTE — ED Provider Notes (Signed)
History     CSN: [id number]  Arrival date & time 10/12/11  1511   First MD Initiated Contact with Patient 10/12/11 1611      Chief Complaint  Patient presents with  . Dysuria    (Consider location/radiation/quality/duration/timing/severity/associated sxs/prior treatment) Patient is a 41 y.o. female presenting with dysuria. The history is provided by the patient. No language interpreter was used.  Dysuria  This is a new problem. The current episode started yesterday. The problem occurs every urination. The problem has not changed since onset.The quality of the pain is described as burning. The pain is at a severity of 6/10. The pain is moderate. There has been no fever. There is no history of pyelonephritis. Associated symptoms include frequency and hematuria. She has tried nothing for the symptoms.   Pt was seen for viral illness yesterday.  Pt complains of pain with urination Past Medical History  Diagnosis Date  . Heart murmur     Past Surgical History  Procedure Date  . Cesarean section   . Knee surgery   . Ceaserian   . Cesarean section   . Knee surgery     Family History  Problem Relation Age of Onset  . Hypertension Other   . Cancer Other     History  Substance Use Topics  . Smoking status: Never Smoker   . Smokeless tobacco: Not on file  . Alcohol Use: No    OB History    Grav Para Term Preterm Abortions TAB SAB Ect Mult Living                  Review of Systems  Genitourinary: Positive for dysuria, frequency and hematuria.  All other systems reviewed and are negative.    Allergies  Penicillins  Home Medications   Current Outpatient Rx  Name Route Sig Dispense Refill  . HYDROCODONE-HOMATROPINE 5-1.5 MG/5ML PO SYRP Oral Take 5 mLs by mouth every 6 (six) hours as needed for cough. 120 mL 0  . LOPERAMIDE HCL 2 MG PO CAPS Oral Take 1 capsule (2 mg total) by mouth 4 (four) times daily as needed for diarrhea or loose stools. 12 capsule 0  .  ONDANSETRON 4 MG PO TBDP Oral Take 1 tablet (4 mg total) by mouth every 8 (eight) hours as needed for nausea. 20 tablet 0    BP 117/72  Pulse 111  Temp(Src) 98.1 F (36.7 C) (Oral)  Resp 18  SpO2 100%  LMP 10/03/2011  Physical Exam  Nursing note and vitals reviewed. Constitutional: She is oriented to person, place, and time. She appears well-developed and well-nourished.  HENT:  Head: Normocephalic.  Right Ear: External ear normal.  Left Ear: External ear normal.  Nose: Nose normal.  Mouth/Throat: Oropharynx is clear and moist.  Eyes: Pupils are equal, round, and reactive to light.  Neck: Normal range of motion.  Cardiovascular: Normal rate.   Pulmonary/Chest: Effort normal.  Abdominal: Soft. Bowel sounds are normal.  Musculoskeletal: Normal range of motion.  Neurological: She is alert and oriented to person, place, and time.  Skin: Skin is warm.  Psychiatric: She has a normal mood and affect.    ED Course  Procedures (including critical care time)  Labs Reviewed  URINALYSIS, ROUTINE W REFLEX MICROSCOPIC - Abnormal; Notable for the following:    Color, Urine RED (*) BIOCHEMICALS MAY BE AFFECTED BY COLOR   APPearance TURBID (*)    Hgb urine dipstick LARGE (*)    Bilirubin Urine SMALL (*)  Ketones, ur 15 (*)    Protein, ur >300 (*)    Leukocytes, UA MODERATE (*)    All other components within normal limits  URINE MICROSCOPIC-ADD ON - Abnormal; Notable for the following:    Squamous Epithelial / LPF FEW (*)    Bacteria, UA FEW (*)    All other components within normal limits   No results found.   No diagnosis found.    MDM  Pt given rx for cipro and hydrocodone and pyridium        Lonia Skinner Dietrich, [place] 10/12/11 [phone]

## 2011-10-12 NOTE — Discharge Instructions (Signed)

## 2011-10-12 NOTE — ED Notes (Signed)
Pt amb to triage with quick steady gait in nad. Pt reports pressure, burning and some hematuria since yesterday.

## 2011-10-13 NOTE — ED Provider Notes (Signed)
Medical screening examination/treatment/procedure(s) were performed by non-physician practitioner and as supervising physician I was immediately available for consultation/collaboration.  Cyndra Numbers, MD 10/13/11 1719

## 2011-10-13 NOTE — ED Provider Notes (Signed)
Medical screening examination/treatment/procedure(s) were performed by non-physician practitioner and as supervising physician I was immediately available for consultation/collaboration.  Kollyns Mickelson, MD 10/13/11 0016 

## 2011-10-31 ENCOUNTER — Other Ambulatory Visit: Payer: Self-pay | Admitting: Obstetrics & Gynecology

## 2011-10-31 DIAGNOSIS — Z1231 Encounter for screening mammogram for malignant neoplasm of breast: Secondary | ICD-10-CM

## 2011-11-21 ENCOUNTER — Encounter (HOSPITAL_COMMUNITY): Payer: Self-pay | Admitting: Emergency Medicine

## 2011-11-21 ENCOUNTER — Emergency Department (HOSPITAL_COMMUNITY)
Admission: EM | Admit: 2011-11-21 | Discharge: 2011-11-21 | Disposition: A | Discharge status: Home / Self Care | Payer: Medicaid Other | Attending: Emergency Medicine | Admitting: Emergency Medicine

## 2011-11-21 DIAGNOSIS — N898 Other specified noninflammatory disorders of vagina: Secondary | ICD-10-CM | POA: Insufficient documentation

## 2011-11-21 LAB — WET PREP, GENITAL
Clue Cells Wet Prep HPF POC: NONE SEEN
Trich, Wet Prep: NONE SEEN
Yeast Wet Prep HPF POC: NONE SEEN

## 2011-11-21 LAB — GLUCOSE, CAPILLARY: Glucose-Capillary: 86 mg/dL (ref 70–99)

## 2011-11-21 MED ORDER — AZITHROMYCIN 250 MG PO TABS
1000.0000 mg | ORAL_TABLET | Freq: Once | ORAL | Status: AC
  Administered 2011-11-21: 1000 mg via ORAL
  Filled 2011-11-21: qty 4

## 2011-11-21 NOTE — Discharge Instructions (Signed)
Your wet prep (vaginal swab) did not show signs of yeast infection. This may be a physiologic discharge (normal discharge made by your body), but it could also potentially be gonorrhea/chlamydia. You've been given a medication to treat this if it is the case. The gonorrhea/chlamydia swab has been sent to the lab, and you'll get a call in 2-3 days if this is positive. Please follow up with your primary care doctor if you are not improving. Return to the ER with worsening pain, nausea/vomiting, urinary symptoms, or further concerns.  RESOURCE GUIDE  Dental Problems  Patients with Medicaid: [facility] [phone]                                           [phone] [address] Phone:  [phone]                                                  Phone:  [phone]  If unable to pay or uninsured, contact:  [facility] or [facility]. to become qualified for the adult dental clinic.  Chronic Pain Problems Contact [facility]  [phone] Patients need to be referred by their primary care doctor.  Insufficient Money for Medicine Contact United Way:  call "211" or [facility] [phone].  No Primary Care Doctor Call [facility]  [phone] Other agencies that provide inexpensive medical care    [facility]  [phone]    [facility] Internal Medicine  [phone]    [facility]  [phone]    [facility]  [phone]    [facility]  [phone]    [facility] Child Clinic  [phone]  Psychological Services [facility]  [phone] [facility] Services  [phone] [facility] Mental Health   [phone] (emergency services [phone])  Substance Abuse Resources Alcohol and Drug Services  [phone] [facility] [phone] [facility] [place] [phone] Floydene Flock [phone] Residential & Outpatient Substance Abuse Program   [phone]  Abuse/Neglect [facility] Child Abuse Hotline (908)383-6116 [facility] Child Abuse Hotline 7255484985 (After Hours)  Emergency Shelter [facility] ([phone]  Maternity Homes Room at the [place] [facility] [place] [phone] [facility] [phone]  MRSA Hotline #:   (281)589-1769    [facility] Resources  [facility] [place]     United Way                          [facility] Dept. [address] [place]                       [address]      [address]  Patrecia Pace  [facility] Phone:  [phone]                                   Phone:  [phone]                 Phone:  [place] Phone:  [place] [phone] (After Hours)

## 2011-11-21 NOTE — ED Notes (Signed)
Pt c/o vaginal irritation, itching, and white malodorous discharge x4 days.  S/s unrelieved by monostat 3 cream , which patient started using Saturday.

## 2011-11-21 NOTE — ED Provider Notes (Signed)
History     CSN: [id number]  Arrival date & time 11/21/11  1638   None     Chief Complaint  Patient presents with  . Vaginitis    (Consider location/radiation/quality/duration/timing/severity/associated sxs/prior treatment) Patient is a 41 y.o. female presenting with vaginal discharge. The history is provided by the patient.  Vaginal Discharge This is a new problem. The current episode started in the past 7 days. The problem occurs constantly. The problem has been unchanged. Pertinent negatives include no abdominal pain, anorexia, chills, fever, nausea or vomiting.  Pt with vaginal irritation, itching, white dc x 4 d. Tried otc monistat with no relief. No new sexual partners. No hx DM. LMP was about 3 weeks ago. Pt has had no urinary sx, abd pain, n/v.  Past Medical History  Diagnosis Date  . Heart murmur     Past Surgical History  Procedure Date  . Cesarean section   . Knee surgery   . Ceaserian   . Cesarean section   . Knee surgery     Family History  Problem Relation Age of Onset  . Hypertension Other   . Cancer Other     History  Substance Use Topics  . Smoking status: Never Smoker   . Smokeless tobacco: Not on file  . Alcohol Use: No    OB History    Grav Para Term Preterm Abortions TAB SAB Ect Mult Living                  Review of Systems  Constitutional: Negative for fever and chills.  Gastrointestinal: Negative for nausea, vomiting, abdominal pain and anorexia.  Genitourinary: Positive for vaginal discharge.  ROS as per HPI  Allergies  Penicillins  Home Medications   Current Outpatient Rx  Name Route Sig Dispense Refill  . IBUPROFEN 200 MG PO TABS Oral Take 200 mg by mouth every 6 (six) hours as needed. Pain.      BP 111/65  Pulse 89  Temp 98.2 F (36.8 C)  Resp 18  SpO2 100%  LMP 10/24/2011  Physical Exam  Nursing note and vitals reviewed. Constitutional: She appears well-developed and well-nourished. No distress.  HENT:    Head: Normocephalic and atraumatic.  Neck: Normal range of motion.  Cardiovascular: Normal rate, regular rhythm and normal heart sounds.   Pulmonary/Chest: Effort normal and breath sounds normal.  Abdominal: Soft. There is no tenderness.  Genitourinary: Vagina normal. Cervix exhibits discharge. Cervix exhibits no motion tenderness and no friability.       RN chaperone present  Moderate amount white, thick vaginal dc seen in vaginal vault  Musculoskeletal: Normal range of motion.  Neurological: She is alert.  Skin: Skin is warm and dry. She is not diaphoretic.  Psychiatric: She has a normal mood and affect.    ED Course  Procedures (including critical care time)  Labs Reviewed  WET PREP, GENITAL - Abnormal; Notable for the following:    WBC, Wet Prep HPF POC TOO NUMEROUS TO COUNT (*)    All other components within normal limits  GC/CHLAMYDIA PROBE AMP, GENITAL  GLUCOSE, CAPILLARY   No results found.   1. Vaginal Discharge       MDM  Wet prep with TNTC WBC. Will tx preemptively for GC/chlam. Findings d/w pt. Return precautions discussed.        Grant Fontana, [place] 11/22/11 1227

## 2011-11-22 ENCOUNTER — Ambulatory Visit (HOSPITAL_COMMUNITY): Payer: Medicaid Other | Attending: Obstetrics & Gynecology

## 2011-11-22 NOTE — ED Provider Notes (Signed)
Medical screening examination/treatment/procedure(s) were performed by non-physician practitioner and as supervising physician I was immediately available for consultation/collaboration. Devoria Albe, MD, Armando Gang   Ward Givens, MD 11/22/11 2121

## 2011-11-30 ENCOUNTER — Emergency Department (HOSPITAL_COMMUNITY)
Admission: EM | Admit: 2011-11-30 | Discharge: 2011-12-01 | Disposition: A | Discharge status: Home / Self Care | Payer: Medicaid Other | Attending: Emergency Medicine | Admitting: Emergency Medicine

## 2011-11-30 ENCOUNTER — Encounter (HOSPITAL_COMMUNITY): Payer: Self-pay | Admitting: Emergency Medicine

## 2011-11-30 DIAGNOSIS — R109 Unspecified abdominal pain: Secondary | ICD-10-CM | POA: Insufficient documentation

## 2011-11-30 DIAGNOSIS — R10819 Abdominal tenderness, unspecified site: Secondary | ICD-10-CM | POA: Insufficient documentation

## 2011-11-30 DIAGNOSIS — R110 Nausea: Secondary | ICD-10-CM | POA: Insufficient documentation

## 2011-11-30 DIAGNOSIS — N898 Other specified noninflammatory disorders of vagina: Secondary | ICD-10-CM | POA: Insufficient documentation

## 2011-11-30 NOTE — ED Notes (Signed)
Pt alert, nad, c/o nausea and abd pain, onset this afternoon, describes pain gen ache, denies changes in bowel or bladder, skin pwd, resp even unlabored

## 2011-11-30 NOTE — ED Notes (Signed)
Pt c/o periumbilical abdominal pain starting today at 1600. Pt c/o nausea, denies vomiting and urinary s/s.

## 2011-12-01 ENCOUNTER — Emergency Department (HOSPITAL_COMMUNITY): Payer: Medicaid Other

## 2011-12-01 LAB — CBC
HCT: 29 % — ABNORMAL LOW (ref 36.0–46.0)
Hemoglobin: 8 g/dL — ABNORMAL LOW (ref 12.0–15.0)
MCHC: 27.6 g/dL — ABNORMAL LOW (ref 30.0–36.0)
MCV: 62.4 fL — ABNORMAL LOW (ref 78.0–100.0)
RDW: 19 % — ABNORMAL HIGH (ref 11.5–15.5)

## 2011-12-01 LAB — URINE MICROSCOPIC-ADD ON

## 2011-12-01 LAB — DIFFERENTIAL
Basophils Relative: 1 % (ref 0–1)
Eosinophils Absolute: 0.1 10*3/uL (ref 0.0–0.7)
Lymphocytes Relative: 52 % — ABNORMAL HIGH (ref 12–46)
Lymphs Abs: 2.7 10*3/uL (ref 0.7–4.0)
Monocytes Relative: 7 % (ref 3–12)
Neutro Abs: 2.1 10*3/uL (ref 1.7–7.7)

## 2011-12-01 LAB — HEPATIC FUNCTION PANEL
ALT: <5 U/L (ref 0–35)
AST: 15 U/L (ref 0–37)
Albumin: 3.7 g/dL (ref 3.5–5.2)
Alkaline Phosphatase: 86 U/L (ref 39–117)
Bilirubin, Direct: <0.1 mg/dL (ref 0.0–0.3)
Total Bilirubin: 0.2 mg/dL — ABNORMAL LOW (ref 0.3–1.2)
Total Protein: 7.8 g/dL (ref 6.0–8.3)

## 2011-12-01 LAB — POCT PREGNANCY, URINE: Preg Test, Ur: NEGATIVE

## 2011-12-01 LAB — BASIC METABOLIC PANEL
BUN: 12 mg/dL (ref 6–23)
Chloride: 103 mEq/L (ref 96–112)
Creatinine, Ser: 0.6 mg/dL (ref 0.50–1.10)
Glucose, Bld: 92 mg/dL (ref 70–99)
Potassium: 3.6 mEq/L (ref 3.5–5.1)

## 2011-12-01 LAB — URINALYSIS, ROUTINE W REFLEX MICROSCOPIC
Bilirubin Urine: NEGATIVE
Ketones, ur: NEGATIVE mg/dL
Specific Gravity, Urine: 1.023 (ref 1.005–1.030)
pH: 7.5 (ref 5.0–8.0)

## 2011-12-01 MED ORDER — ONDANSETRON 8 MG PO TBDP: 8.0000 mg | ORAL_TABLET | Freq: Three times a day (TID) | ORAL | Status: AC | PRN

## 2011-12-01 MED ORDER — ONDANSETRON HCL 4 MG/2ML IJ SOLN
4.0000 mg | Freq: Once | INTRAMUSCULAR | Status: AC
  Administered 2011-12-01: 4 mg via INTRAVENOUS
  Filled 2011-12-01: qty 2

## 2011-12-01 MED ORDER — SODIUM CHLORIDE 0.9 % IV SOLN
Freq: Once | INTRAVENOUS | Status: AC
  Administered 2011-12-01: 05:00:00 via INTRAVENOUS

## 2011-12-01 MED ORDER — HYDROCODONE-ACETAMINOPHEN 5-325 MG PO TABS: ORAL_TABLET | ORAL | Status: AC

## 2011-12-01 MED ORDER — SUCRALFATE 1 G PO TABS: 1.0000 g | ORAL_TABLET | Freq: Four times a day (QID) | ORAL | Status: DC

## 2011-12-01 MED ORDER — GI COCKTAIL ~~LOC~~
30.0000 mL | Freq: Once | ORAL | Status: AC
  Administered 2011-12-01: 30 mL via ORAL
  Filled 2011-12-01: qty 30

## 2011-12-01 MED ORDER — HYDROMORPHONE HCL PF 1 MG/ML IJ SOLN
1.0000 mg | Freq: Once | INTRAMUSCULAR | Status: AC
  Administered 2011-12-01: 1 mg via INTRAVENOUS
  Filled 2011-12-01: qty 1

## 2011-12-01 MED ORDER — OMEPRAZOLE 20 MG PO CPDR: DELAYED_RELEASE_CAPSULE | ORAL | Status: DC

## 2011-12-01 MED ORDER — HYDROCODONE-ACETAMINOPHEN 5-325 MG PO TABS
1.0000 | ORAL_TABLET | Freq: Once | ORAL | Status: AC
  Administered 2011-12-01: 1 via ORAL
  Filled 2011-12-01: qty 1

## 2011-12-01 MED ORDER — OXYCODONE-ACETAMINOPHEN 5-325 MG PO TABS
1.0000 | ORAL_TABLET | Freq: Once | ORAL | Status: AC
  Administered 2011-12-01: 1 via ORAL
  Filled 2011-12-01: qty 1

## 2011-12-01 NOTE — ED Provider Notes (Signed)
Medical screening examination/treatment/procedure(s) were conducted as a shared visit with non-physician practitioner(s) and myself.  I personally evaluated the patient during the encounter  Please see my separate respective documentation pertaining to this patient encounter   Vida Roller, MD 12/01/11 2316

## 2011-12-01 NOTE — ED Provider Notes (Signed)
History     CSN: [id number]  Arrival date & time 11/30/11  2322   First MD Initiated Contact with Patient 12/01/11 0012      Chief Complaint  Patient presents with  . Abdominal Pain    nausea    HPI: Patient is a 41 y.o. female presenting with abdominal pain. The history is provided by the patient.  Abdominal Pain The primary symptoms of the illness include abdominal pain, nausea and vaginal bleeding. The primary symptoms of the illness do not include fever, shortness of breath, vomiting, diarrhea, hematemesis, hematochezia, dysuria or vaginal discharge. The current episode started 6 to 12 hours ago. The onset of the illness was gradual. The problem has been gradually worsening.  The illness is associated with NSAID use and eating. The patient states that she believes she is currently not pregnant. The patient has not had a change in bowel habit. Symptoms associated with the illness do not include chills, anorexia, constipation, urgency, frequency or back pain.  Pt reports onset of mid to upper abd pain at approx 1600 today. States she is on her menses but this is not her typical menses discomfort and states she has never had this pain before. Pain is associated w/ nausea w/o vomiting and is currently 9/10. Denies diarrhea or fever. States pain is worse after eating or taking Ibuprofen as she has done today for the pain. Denies uti sx's.  Past Medical History  Diagnosis Date  . Heart murmur     Past Surgical History  Procedure Date  . Cesarean section   . Knee surgery   . Ceaserian   . Cesarean section   . Knee surgery     Family History  Problem Relation Age of Onset  . Hypertension Other   . Cancer Other     History  Substance Use Topics  . Smoking status: Never Smoker   . Smokeless tobacco: Not on file  . Alcohol Use: No    OB History    Grav Para Term Preterm Abortions TAB SAB Ect Mult Living                  Review of Systems  Constitutional: Negative.   Negative for fever and chills.  HENT: Negative.   Eyes: Negative.   Respiratory: Negative for chest tightness and shortness of breath.   Cardiovascular: Negative for chest pain.  Gastrointestinal: Positive for nausea and abdominal pain. Negative for vomiting, diarrhea, constipation, hematochezia, anorexia and hematemesis.  Genitourinary: Positive for vaginal bleeding. Negative for dysuria, urgency, frequency and vaginal discharge.  Musculoskeletal: Negative.  Negative for back pain.  Neurological: Negative.   Hematological: Negative.   Psychiatric/Behavioral: Negative.     Allergies  Penicillins  Home Medications  No current outpatient prescriptions on file.  BP 127/81  Pulse 79  Temp 98 F (36.7 C)  Resp 16  Wt 240 lb (108.863 kg)  SpO2 99%  LMP 10/24/2011  Physical Exam  Constitutional: She is oriented to person, place, and time. She appears well-developed and well-nourished.  HENT:  Head: Normocephalic and atraumatic.  Eyes: Conjunctivae are normal.  Neck: Neck supple.  Cardiovascular: Normal rate and regular rhythm.   Pulmonary/Chest: Effort normal and breath sounds normal.  Abdominal: Soft. Bowel sounds are normal.         Mild peri-umbilical, epigastic and RUQ TTP.  Musculoskeletal: Normal range of motion.  Neurological: She is alert and oriented to person, place, and time.  Skin: Skin is warm and dry.  Psychiatric: She has a normal mood and affect.    ED Course  Procedures   Delay discussed w/ pt. Pt reports some relief of pain w/ PO med for pain and GI cocktail. Pain now 7/10. Will re-address pain management and await abd u/s.  I have discussed pt w/ Felicita Gage, PA who has agreed to accept care of pt pending abd u/s and disposition.  Labs Reviewed  CBC - Abnormal; Notable for the following:    Hemoglobin 8.0 (*)    HCT 29.0 (*)    MCV 62.4 (*)    MCH 17.2 (*)    MCHC 27.6 (*)    RDW 19.0 (*)    All other components within normal limits  DIFFERENTIAL  - Abnormal; Notable for the following:    Neutrophils Relative 38 (*)    Lymphocytes Relative 52 (*)    All other components within normal limits  URINALYSIS, ROUTINE W REFLEX MICROSCOPIC - Abnormal; Notable for the following:    Hgb urine dipstick LARGE (*)    All other components within normal limits  LIPASE, BLOOD - Abnormal; Notable for the following:    Lipase 69 (*)    All other components within normal limits  URINE MICROSCOPIC-ADD ON - Abnormal; Notable for the following:    Bacteria, UA MANY (*)    All other components within normal limits  BASIC METABOLIC PANEL  POCT PREGNANCY, URINE   No results found.   No diagnosis found.    MDM  HPI/PE and clinical findings suspicious for biliary colic Other differentials include GERD/gastritis Abd u/s pending       Leanne Chang, NP 12/01/11 [phone]

## 2011-12-01 NOTE — ED Notes (Signed)
41 year old obese female with a history of C-section presents with a complaint of upper abdominal pain. This started approximately 10 hours ago, has been mostly persistent described as a sharp pain that seems to get worse when she. She denies any nausea or vomiting at this time though she has had some nausea this afternoon. She denies dysuria, diarrhea, shortness of breath fevers back pain swelling or rashes. She denies alcohol use, occasional anti-inflammatory use related to [facility] cramping.  Physical exam:  Patient sleeping, easily arousable, abdomen is rotund, obese, soft and non-peritoneal. She has no lower abdominal tenderness she has mild supraumbilical and epigastric tenderness without guarding or masses. There is no right upper quadrant tenderness and no Murphy's sign. There are no tympanitic sounds to percussion  Assessment:  Ongoing mild upper abdominal pain which has improved significantly after Percocet. Ultrasound ordered, LFTs added to lab work, rule out cholecystitis. Patient declines further medication at this time  Medical screening examination/treatment/procedure(s) were conducted as a shared visit with non-physician practitioner(s) and myself.  I personally evaluated the patient during the encounter   Vida Roller, MD 12/01/11 [phone]

## 2011-12-01 NOTE — ED Notes (Signed)
MD/FNP and Dois Davenport CRN at bedside. Pt informed of delay for the [place] test. Pt verbalizes understanding. Will follow up.

## 2011-12-01 NOTE — ED Notes (Signed)
PA at bedside.

## 2011-12-01 NOTE — ED Provider Notes (Signed)
History     CSN: [id number]  Arrival date & time 11/30/11  2322   First MD Initiated Contact with Patient 12/01/11 0012      Chief Complaint  Patient presents with  . Abdominal Pain    nausea    (Consider location/radiation/quality/duration/timing/severity/associated sxs/prior treatment) HPI  Past Medical History  Diagnosis Date  . Heart murmur     Past Surgical History  Procedure Date  . Cesarean section   . Knee surgery   . Ceaserian   . Cesarean section   . Knee surgery     Family History  Problem Relation Age of Onset  . Hypertension Other   . Cancer Other     History  Substance Use Topics  . Smoking status: Never Smoker   . Smokeless tobacco: Not on file  . Alcohol Use: No    OB History    Grav Para Term Preterm Abortions TAB SAB Ect Mult Living                  Review of Systems  Allergies  Penicillins  Home Medications  No current outpatient prescriptions on file.  BP 92/47  Pulse 79  Temp(Src) 98.1 F (36.7 C) (Oral)  Resp 19  Wt 240 lb (108.863 kg)  SpO2 98%  LMP 10/24/2011  Physical Exam  ED Course  Procedures (including critical care time)  Labs Reviewed  CBC - Abnormal; Notable for the following:    Hemoglobin 8.0 (*)    HCT 29.0 (*)    MCV 62.4 (*)    MCH 17.2 (*)    MCHC 27.6 (*)    RDW 19.0 (*)    All other components within normal limits  DIFFERENTIAL - Abnormal; Notable for the following:    Neutrophils Relative 38 (*)    Lymphocytes Relative 52 (*)    All other components within normal limits  URINALYSIS, ROUTINE W REFLEX MICROSCOPIC - Abnormal; Notable for the following:    Hgb urine dipstick LARGE (*)    All other components within normal limits  LIPASE, BLOOD - Abnormal; Notable for the following:    Lipase 69 (*)    All other components within normal limits  URINE MICROSCOPIC-ADD ON - Abnormal; Notable for the following:    Bacteria, UA MANY (*)    All other components within normal limits  HEPATIC  FUNCTION PANEL - Abnormal; Notable for the following:    Total Bilirubin 0.2 (*)    All other components within normal limits  BASIC METABOLIC PANEL  POCT PREGNANCY, URINE   US Abdomen Complete  12/01/2011  *RADIOLOGY REPORT*  Clinical Data:  Abdominal pain.  ABDOMEN ULTRASOUND  Technique:  Complete abdominal ultrasound examination was performed including evaluation of the liver, gallbladder, bile ducts, pancreas, kidneys, spleen, IVC, and abdominal aorta.  Comparison:  None  Findings:  Gallbladder:  No shadowing gallstones or echogenic sludge.  No gallbladder wall thickening or pericholecystic fluid.  Negative sonographic Murphy's sign according to the ultrasound technologist.  Common Bile Duct:  Normal caliber of 4 mm.  Liver:  Normal size and echotexture without focal parenchymal abnormality.  Patent portal vein with hepatopetal flow.  IVC:  Patent throughout its visualized course in the abdomen.  Pancreas:  Although the pancreas is difficult to visualize in its entirety, no focal pancreatic abnormality is identified.  Spleen:  The spleen is of normal echotexture and size.  Kidneys:  The right kidney measures approximately 13.4 cm and the left kidney 12.8 cm.  Both have a normal sonographic appearance.  Abdominal Aorta:  Normal caliber abdominal aorta.  IMPRESSION: Normal abdominal ultrasound.  Original Report Authenticated By: Reola Calkins, M.D.     1. Abdominal pain     6:31 AM Handoff from Schorr NP at 0600. Patient with periumbilical, RUQ, epigastric pain. No fever, vomiting. Labs: Chronic anemia at baseline, mildly elevated lipase. Pending: abd [place]. Plan: If neg, d/c to home with PPI, pain meds, nausea meds.   8:14 AM [place] negative. Patient re-examined. She was sleeping comfortably when I entered room. She states pain remains but is improved from arrival. Pt informed of lab and US findings. Will d/c to home on PPI and with medicine for pain and nausea. She states she does not have a PCP but  has a doctor that she can see if needed.   The patient was urged to return to the Emergency Department immediately with worsening of current symptoms, worsening abdominal pain, persistent vomiting, blood noted in stools, fever, or any other concerns. The patient verbalized understanding.   Patient counseled on use of narcotic pain medications. Counseled not to combine these medications with others containing tylenol. Urged not to drink alcohol, drive, or perform any other activities that requires focus while taking these medications. The patient verbalizes understanding and agrees with the plan.  MDM  Upper abd pain, [place] neg. Labs reassuring. Will treat at PUD or gastritis. Do not suspect cholecystitis. Mild elevation in lipase, but patient is not vomiting. If pancreatitis, patient is low risk for adverse outcome. Doubt pulmonary etiology. Patient appears well. Vitals stable.         [place], [place] 12/01/11 [phone]

## 2011-12-01 NOTE — Discharge Instructions (Signed)
Please read and follow all provided instructions.  Your diagnoses today include:  1. Abdominal pain     Tests performed today include:  Blood counts and electrolytes  Blood tests to check liver and kidney function  Blood tests to check pancreas function - slightly high  Urine test to look for infection and pregnancy (in women)  Ultrasound which did not show any gallstones or other problems  Vital signs. See below for your results today.   Medications prescribed:   Vicodin (hydrocodone/acetaminophen) - narcotic pain medication  You have been prescribed narcotic pain medication such as Vicodin or Percocet: DO NOT drive or perform any activities that require you to be awake and alert because this medicine can make you drowsy. BE VERY CAREFUL not to take multiple medicines containing Tylenol (also called acetaminophen). Doing so can lead to an overdose which can damage your liver and cause liver failure and possibly death.    Zofran (ondansetron) - for nausea and vomiting   Carafate - for stomach upset and to protect your stomach  Prilosec (omeprazole) - stomach acid reducer  Take any prescribed medications only as directed.  Home care instructions:   Follow any educational materials contained in this packet.  Follow-up instructions: Please follow-up with your primary care provider in the next 2 days for further evaluation of your symptoms. If you do not have a primary care doctor -- see below for referral information.   Return instructions:  SEEK IMMEDIATE MEDICAL ATTENTION IF:  The pain does not go away or becomes severe   A temperature above 101F develops   Repeated vomiting occurs (multiple episodes)   The pain becomes localized to portions of the abdomen. The right side could possibly be appendicitis. In an adult, the left lower portion of the abdomen could be colitis or diverticulitis.   Blood is being passed in stools or vomit (bright red or black tarry  stools)   You develop chest pain, difficulty breathing, dizziness or fainting, or become confused, poorly responsive, or inconsolable (young children)  If you have any other emergent concerns regarding your health  Additional Information: Abdominal (belly) pain can be caused by many things. Your caregiver performed an examination and possibly ordered blood/urine tests and imaging (CT scan, x-rays, ultrasound). Many cases can be observed and treated at home after initial evaluation in the emergency department. Even though you are being discharged home, abdominal pain can be unpredictable. Therefore, you need a repeated exam if your pain does not resolve, returns, or worsens. Most patients with abdominal pain don't have to be admitted to the hospital or have surgery, but serious problems like appendicitis and gallbladder attacks can start out as nonspecific pain. Many abdominal conditions cannot be diagnosed in one visit, so follow-up evaluations are very important.  Your vital signs today were: BP 116/70  Pulse 60  Temp(Src) 98.1 F (36.7 C) (Oral)  Resp 12  Wt 240 lb (108.863 kg)  SpO2 98%  LMP 10/24/2011 If your blood pressure (bp) was elevated above 135/85 this visit, please have this repeated by your doctor within one month. -------------- No Primary Care Doctor Call [facility]  [phone] Other agencies that provide inexpensive medical care    [facility]  [phone]    [facility] Internal [facility]  [phone]    [facility]  [phone]    [facility]  [phone]    [facility]  [phone]    [facility]  [phone] -------------- RESOURCE GUIDE:  Dental  Problems  Patients with Medicaid: [facility] [phone] [address]                                            [phone] [address] Phone:  [phone]                                                      Phone:  [phone]  If unable to pay or  uninsured, contact:  [facility] or [facility]. to become qualified for the adult dental clinic.  Chronic Pain Problems Contact [facility]  [phone] Patients need to be referred by their primary care doctor.  Insufficient Money for Medicine Contact [employer]:  call "211" or [facility] [phone].  Psychological Services [facility] Behavioral Health  [phone] [facility]  [phone] [facility] Mental Health   [phone] (emergency services [phone])  Substance Abuse Resources [facility]  [phone] [facility] [phone] [facility] [place] [phone] Floydene Flock [phone] Residential & Outpatient Substance Abuse Program  [phone]  Abuse/Neglect [facility] Child Abuse Hotline 970-772-5085 [facility] Child Abuse Hotline [phone] (After Hours)  Emergency Shelter [facility] [phone]  M[facility] [phone] [place] [facility] [phone]  [facility] Resources  [facility] [place]     [employer]                          [facility] Dept. [address] [place]                       [address]      [address]  Blondell Reveal Phone:  [phone]                                   Phone:  [phone]                 Phone:  [phone]  [facility] Mental Health Phone:  [phone]  [facility] Child Abuse Hotline 773-091-5600 [phone] (After Hours)

## 2011-12-01 NOTE — ED Provider Notes (Signed)
Medical screening examination/treatment/procedure(s) were conducted as a shared visit with non-physician practitioner(s) and myself.  I personally evaluated the patient during the encounter  Please see my separate respective documentation pertaining to this patient encounter   Debie Ashline D Alvar Malinoski, MD 12/01/11 2316 

## 2011-12-12 ENCOUNTER — Encounter (HOSPITAL_COMMUNITY): Payer: Self-pay | Admitting: Emergency Medicine

## 2011-12-12 ENCOUNTER — Emergency Department (HOSPITAL_COMMUNITY)
Admission: EM | Admit: 2011-12-12 | Discharge: 2011-12-12 | Disposition: A | Discharge status: Home / Self Care | Payer: Medicaid Other | Attending: Emergency Medicine | Admitting: Emergency Medicine

## 2011-12-12 DIAGNOSIS — R011 Cardiac murmur, unspecified: Secondary | ICD-10-CM | POA: Insufficient documentation

## 2011-12-12 DIAGNOSIS — R1013 Epigastric pain: Secondary | ICD-10-CM | POA: Insufficient documentation

## 2011-12-12 DIAGNOSIS — R110 Nausea: Secondary | ICD-10-CM | POA: Insufficient documentation

## 2011-12-12 LAB — COMPREHENSIVE METABOLIC PANEL
ALT: <5 U/L (ref 0–35)
Albumin: 3.5 g/dL (ref 3.5–5.2)
Alkaline Phosphatase: 71 U/L (ref 39–117)
Glucose, Bld: 94 mg/dL (ref 70–99)
Potassium: 3.4 mEq/L — ABNORMAL LOW (ref 3.5–5.1)
Sodium: 140 mEq/L (ref 135–145)
Total Protein: 7.3 g/dL (ref 6.0–8.3)

## 2011-12-12 LAB — CBC
Hemoglobin: 7.4 g/dL — ABNORMAL LOW (ref 12.0–15.0)
MCHC: 28.1 g/dL — ABNORMAL LOW (ref 30.0–36.0)
RDW: 18.4 % — ABNORMAL HIGH (ref 11.5–15.5)
WBC: 4.5 10*3/uL (ref 4.0–10.5)

## 2011-12-12 MED ORDER — PROMETHAZINE HCL 25 MG PO TABS: 25.0000 mg | ORAL_TABLET | Freq: Four times a day (QID) | ORAL | Status: DC | PRN

## 2011-12-12 MED ORDER — ONDANSETRON HCL 4 MG/2ML IJ SOLN
4.0000 mg | Freq: Once | INTRAMUSCULAR | Status: AC
  Administered 2011-12-12: 4 mg via INTRAVENOUS
  Filled 2011-12-12: qty 2

## 2011-12-12 MED ORDER — LORAZEPAM 2 MG/ML IJ SOLN
1.0000 mg | Freq: Once | INTRAMUSCULAR | Status: AC
  Administered 2011-12-12: 1 mg via INTRAVENOUS
  Filled 2011-12-12: qty 1

## 2011-12-12 MED ORDER — HYDROMORPHONE HCL PF 1 MG/ML IJ SOLN
1.0000 mg | Freq: Once | INTRAMUSCULAR | Status: AC
  Administered 2011-12-12: 1 mg via INTRAVENOUS
  Filled 2011-12-12: qty 1

## 2011-12-12 MED ORDER — SODIUM CHLORIDE 0.9 % IV BOLUS (SEPSIS)
1000.0000 mL | Freq: Once | INTRAVENOUS | Status: AC
  Administered 2011-12-12: 1000 mL via INTRAVENOUS

## 2011-12-12 NOTE — ED Notes (Signed)
Per EMS- patient reports that she is having epigastric pain with nausea and diarrhea.

## 2011-12-12 NOTE — Discharge Instructions (Signed)

## 2011-12-12 NOTE — ED Notes (Signed)
Seen here 2 weeks ago for same, has not gotten anybetter. No vomiting, just nauseous, having diarrhea

## 2011-12-12 NOTE — ED Provider Notes (Signed)
History     CSN: [id number]  Arrival date & time 12/12/11  1645   First MD Initiated Contact with Patient 12/12/11 1902      Chief Complaint  Patient presents with  . Abdominal Pain  . Nausea     The history is provided by the patient.   patient reports approximately one year of epigastric discomfort.  She's had nausea without vomiting.  She denies diarrhea.  She has no melena or hematochezia.  She's had this pain for a long time and no other physician has determined the cause of her discomfort.  Nothing worsens her symptoms.  Nothing improves her symptoms.  Her symptoms are not changed by food.  She has no shortness of breath or fevers or chills.  There is no radiation of her pain.  Her pain is mild to moderate at this time.  Past Medical History  Diagnosis Date  . Heart murmur     Past Surgical History  Procedure Date  . Cesarean section   . Knee surgery   . Ceaserian   . Cesarean section   . Knee surgery     Family History  Problem Relation Age of Onset  . Hypertension Other   . Cancer Other     History  Substance Use Topics  . Smoking status: Never Smoker   . Smokeless tobacco: Not on file  . Alcohol Use: No    OB History    Grav Para Term Preterm Abortions TAB SAB Ect Mult Living                  Review of Systems  Gastrointestinal: Positive for abdominal pain.  All other systems reviewed and are negative.    Allergies  Penicillins  Home Medications   Current Outpatient Rx  Name Route Sig Dispense Refill  . OMEPRAZOLE 20 MG PO CPDR Oral Take 20 mg by mouth daily.    Marland Kitchen ONDANSETRON 8 MG PO TBDP Oral Take 8 mg by mouth every 8 (eight) hours as needed. For nausea    . SUCRALFATE 1 G PO TABS Oral Take 1 tablet (1 g total) by mouth 4 (four) times daily. 20 tablet 0  . HYDROCODONE-ACETAMINOPHEN 5-325 MG PO TABS  Take 1-2 tablets every 6 hours as needed for severe pain 12 tablet 0  . PROMETHAZINE HCL 25 MG PO TABS Oral Take 1 tablet (25 mg total) by  mouth every 6 (six) hours as needed for nausea. 30 tablet 0    BP 112/50  Pulse 63  Temp(Src) 98.3 F (36.8 C) (Oral)  Resp 14  SpO2 96%  LMP 10/24/2011  Physical Exam  Nursing note and vitals reviewed. Constitutional: She is oriented to person, place, and time. She appears well-developed and well-nourished. No distress.  HENT:  Head: Normocephalic and atraumatic.  Eyes: EOM are normal.  Neck: Normal range of motion.  Cardiovascular: Normal rate, regular rhythm and normal heart sounds.   Pulmonary/Chest: Effort normal and breath sounds normal.  Abdominal: Soft. She exhibits no distension, no ascites and no mass. There is no tenderness.  Musculoskeletal: Normal range of motion.  Neurological: She is alert and oriented to person, place, and time.  Skin: Skin is warm and dry.  Psychiatric: She has a normal mood and affect. Judgment normal.    ED Course  Procedures (including critical care time)  Labs Reviewed  CBC - Abnormal; Notable for the following:    Hemoglobin 7.4 (*)    HCT 26.3 (*)  MCV 61.7 (*)    MCH 17.4 (*)    MCHC 28.1 (*)    RDW 18.4 (*)    All other components within normal limits  COMPREHENSIVE METABOLIC PANEL - Abnormal; Notable for the following:    Potassium 3.4 (*)    Total Bilirubin 0.2 (*)    All other components within normal limits  LIPASE, BLOOD  HCG, SERUM, QUALITATIVE   No results found.   1. Abdominal pain       MDM  Patient with long-standing history of epigastric discomfort without known cause.  She was seen week and a half ago and had labs and abdominal ultrasound are without significant abnormality.  She had no gallstones noted.  The patient was discharged home with Zofran was given GI followup.  She reports she's never seen a gastroenterologist.  She's never had an endoscopy.  Her labs today are without significant abnormality.  Her abdominal exam is benign.  she's been referred to gastroenterology for  followup        Lyanne Co, MD 12/12/11 2357

## 2011-12-30 ENCOUNTER — Emergency Department (HOSPITAL_COMMUNITY)
Admission: EM | Admit: 2011-12-30 | Discharge: 2011-12-30 | Disposition: A | Discharge status: Home / Self Care | Payer: Medicaid Other | Attending: Emergency Medicine | Admitting: Emergency Medicine

## 2011-12-30 ENCOUNTER — Encounter (HOSPITAL_COMMUNITY): Payer: Self-pay | Admitting: *Deleted

## 2011-12-30 DIAGNOSIS — R197 Diarrhea, unspecified: Secondary | ICD-10-CM | POA: Insufficient documentation

## 2011-12-30 DIAGNOSIS — R109 Unspecified abdominal pain: Secondary | ICD-10-CM | POA: Insufficient documentation

## 2011-12-30 DIAGNOSIS — R110 Nausea: Secondary | ICD-10-CM | POA: Insufficient documentation

## 2011-12-30 LAB — COMPREHENSIVE METABOLIC PANEL
ALT: <5 U/L (ref 0–35)
AST: 11 U/L (ref 0–37)
Albumin: 3.3 g/dL — ABNORMAL LOW (ref 3.5–5.2)
Alkaline Phosphatase: 73 U/L (ref 39–117)
BUN: 11 mg/dL (ref 6–23)
CO2: 25 mEq/L (ref 19–32)
Calcium: 8.9 mg/dL (ref 8.4–10.5)
Chloride: 105 mEq/L (ref 96–112)
Creatinine, Ser: 0.57 mg/dL (ref 0.50–1.10)
GFR calc Af Amer: >90 mL/min (ref >90)
GFR calc non Af Amer: >90 mL/min (ref >90)
Glucose, Bld: 86 mg/dL (ref 70–99)
Potassium: 3.9 mEq/L (ref 3.5–5.1)
Sodium: 138 mEq/L (ref 135–145)
Total Bilirubin: 0.1 mg/dL — ABNORMAL LOW (ref 0.3–1.2)
Total Protein: 7 g/dL (ref 6.0–8.3)

## 2011-12-30 LAB — URINALYSIS, ROUTINE W REFLEX MICROSCOPIC
Bilirubin Urine: NEGATIVE
Glucose, UA: NEGATIVE mg/dL
Ketones, ur: NEGATIVE mg/dL
Leukocytes, UA: NEGATIVE
Nitrite: NEGATIVE
Protein, ur: NEGATIVE mg/dL
Specific Gravity, Urine: 1.023 (ref 1.005–1.030)
Urobilinogen, UA: 0.2 mg/dL (ref 0.0–1.0)
pH: 6 (ref 5.0–8.0)

## 2011-12-30 LAB — URINE MICROSCOPIC-ADD ON

## 2011-12-30 LAB — LIPASE, BLOOD: Lipase: 42 U/L (ref 11–59)

## 2011-12-30 MED ORDER — FAMOTIDINE 20 MG PO TABS: 20.0000 mg | ORAL_TABLET | Freq: Two times a day (BID) | ORAL | Status: DC

## 2011-12-30 MED ORDER — GI COCKTAIL ~~LOC~~
30.0000 mL | Freq: Once | ORAL | Status: DC
  Filled 2011-12-30: qty 30

## 2011-12-30 MED ORDER — SUCRALFATE 1 G PO TABS: 1.0000 g | ORAL_TABLET | Freq: Four times a day (QID) | ORAL | Status: DC

## 2011-12-30 MED ORDER — DICYCLOMINE HCL 10 MG PO CAPS
10.0000 mg | ORAL_CAPSULE | ORAL | Status: DC
  Filled 2011-12-30: qty 1

## 2011-12-30 NOTE — ED Notes (Signed)
MD made aware that pt refused PO meds and requests IV pain meds.

## 2011-12-30 NOTE — ED Notes (Signed)
Pt refused PO meds and requests IV and IV dilauded,states she received in the past. "I came here before and that's what I get, I wanna talk with the doctor". Pt resting in bed watching TV, no visible distress noted.

## 2011-12-30 NOTE — ED Notes (Signed)
Went to bathroom.

## 2011-12-30 NOTE — ED Provider Notes (Signed)
History    41yf with abdominal pain. Gradual onset about 3d ago. Upper abdomen and doesn't lateralize. relatievly constant. No appreciable exacerbating or relieving factors. Previous evaluations for same w/o clear etiology. Nausea. No vomiting. A few loose stools without blood or melena. No fever or chills. No urinary complaints. NO unusual vaginal bleeding or discharge. Surgical hx significant for c section. CSN: [id number]  Arrival date & time 12/30/11  1459   First MD Initiated Contact with Patient 12/30/11 1613      Chief Complaint  Patient presents with  . Abdominal Pain    upper  . Nausea  . Diarrhea    (Consider location/radiation/quality/duration/timing/severity/associated sxs/prior treatment) HPI  Past Medical History  Diagnosis Date  . Heart murmur     Past Surgical History  Procedure Date  . Cesarean section   . Knee surgery   . Ceaserian   . Cesarean section   . Knee surgery     Family History  Problem Relation Age of Onset  . Hypertension Other   . Cancer Other     History  Substance Use Topics  . Smoking status: Never Smoker   . Smokeless tobacco: Never Used  . Alcohol Use: No    OB History    Grav Para Term Preterm Abortions TAB SAB Ect Mult Living                  Review of Systems   Review of symptoms negative unless otherwise noted in HPI.   Allergies  Penicillins  Home Medications   Current Outpatient Rx  Name Route Sig Dispense Refill  . PROMETHAZINE HCL 25 MG PO TABS Oral Take 1 tablet (25 mg total) by mouth every 6 (six) hours as needed for nausea. 30 tablet 0    BP 112/61  Pulse 73  Temp(Src) 98.4 F (36.9 C) (Oral)  Resp 19  Ht 5\' 7"  (1.702 m)  Wt 251 lb 5.2 oz (114 kg)  BMI 39.36 kg/m2  SpO2 98%  LMP 12/26/2011  Physical Exam  Nursing note and vitals reviewed. Constitutional: She appears well-developed and well-nourished. No distress.       Laying in bed. Nad. Obese.  HENT:  Head: Normocephalic and  atraumatic.  Eyes: Conjunctivae are normal. Right eye exhibits no discharge. Left eye exhibits no discharge.  Neck: Neck supple.  Cardiovascular: Normal rate, regular rhythm and normal heart sounds.  Exam reveals no gallop and no friction rub.   No murmur heard. Pulmonary/Chest: Effort normal and breath sounds normal. No respiratory distress.  Abdominal: Soft. She exhibits no distension and no mass. There is tenderness. There is no rebound.       Mild tenderness in epigastrium without rebound or guarding. No distension.  Genitourinary:       No cva tenderness  Musculoskeletal: She exhibits no edema and no tenderness.  Neurological: She is alert.  Skin: Skin is warm and dry.  Psychiatric: She has a normal mood and affect. Her behavior is normal. Thought content normal.    ED Course  Procedures (including critical care time)  Labs Reviewed  COMPREHENSIVE METABOLIC PANEL - Abnormal; Notable for the following:    Albumin 3.3 (*)    Total Bilirubin 0.1 (*)    All other components within normal limits  URINALYSIS, ROUTINE W REFLEX MICROSCOPIC - Abnormal; Notable for the following:    APPearance CLOUDY (*)    Hgb urine dipstick LARGE (*)    All other components within normal limits  URINE MICROSCOPIC-ADD  ON - Abnormal; Notable for the following:    Bacteria, UA FEW (*)    All other components within normal limits  LIPASE, BLOOD  LAB REPORT - SCANNED   No results found.   1. Abdominal pain     6:22 PM Pt requesting narcotics. Says only that that works for her is getting and IV and then dilaudid. Refusing bentyl and GI cocktail that was ordered.   MDM  41yF with abdominal pain.  Gastritis, peptic ulcer disease, biliary colic, cholelithiasis, cholecystitis, cholangitis, hepatitis, renal colic, urinary tract infection, colitis, constipation, gastroenteritis, atypical ACS, mesenteric ischemia all considered among other etiologies in the patient's differential diagnosis. Mild  tenderness in epigastium and suspect gastritis or PUD. Less likely biliary colic. Doubt obstruction. Low suspicion for emergent process. Plan symptomatic tx and outpt fu.        Raeford Razor, MD 01/05/12 [phone]

## 2011-12-30 NOTE — Discharge Instructions (Signed)

## 2011-12-30 NOTE — ED Notes (Signed)
Pt from home with reports of upper abdominal pain, nausea and diarrhea that started 3 days ago. Pt denies vomiting, recent surgery or injury.

## 2012-02-15 ENCOUNTER — Emergency Department (HOSPITAL_COMMUNITY)
Admission: EM | Admit: 2012-02-15 | Discharge: 2012-02-18 | Payer: No Typology Code available for payment source | Attending: Emergency Medicine | Admitting: Emergency Medicine

## 2012-02-15 ENCOUNTER — Encounter (HOSPITAL_COMMUNITY): Payer: Self-pay | Admitting: Emergency Medicine

## 2012-02-15 DIAGNOSIS — T07XXXA Unspecified multiple injuries, initial encounter: Secondary | ICD-10-CM | POA: Insufficient documentation

## 2012-02-15 DIAGNOSIS — M549 Dorsalgia, unspecified: Secondary | ICD-10-CM | POA: Insufficient documentation

## 2012-02-15 DIAGNOSIS — M79609 Pain in unspecified limb: Secondary | ICD-10-CM | POA: Insufficient documentation

## 2012-02-15 DIAGNOSIS — T7421XA Adult sexual abuse, confirmed, initial encounter: Secondary | ICD-10-CM | POA: Insufficient documentation

## 2012-02-15 DIAGNOSIS — M542 Cervicalgia: Secondary | ICD-10-CM | POA: Insufficient documentation

## 2012-02-15 MED ORDER — METRONIDAZOLE 500 MG PO TABS
ORAL_TABLET | ORAL | Status: AC
  Filled 2012-02-15: qty 4

## 2012-02-15 MED ORDER — PROMETHAZINE HCL 25 MG PO TABS
ORAL_TABLET | ORAL | Status: AC
  Filled 2012-02-15: qty 3

## 2012-02-15 MED ORDER — AZITHROMYCIN 1 G PO PACK
PACK | ORAL | Status: AC
  Filled 2012-02-15: qty 1

## 2012-02-15 MED ORDER — LEVONORGESTREL 0.75 MG PO TABS
ORAL_TABLET | ORAL | Status: AC
  Filled 2012-02-15: qty 2

## 2012-02-15 MED ORDER — CEFIXIME 400 MG PO TABS
ORAL_TABLET | ORAL | Status: AC
  Filled 2012-02-15: qty 1

## 2012-02-15 MED ORDER — CEFTRIAXONE SODIUM 250 MG IJ SOLR
INTRAMUSCULAR | Status: AC
  Filled 2012-02-15: qty 250

## 2012-02-15 MED ORDER — ACETAMINOPHEN 325 MG PO TABS
650.0000 mg | ORAL_TABLET | Freq: Once | ORAL | Status: DC
  Filled 2012-02-15: qty 2
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
## 2012-02-15 NOTE — ED Notes (Signed)
Tylenol to be given upstairs by SANE nurse (per SANE RN).

## 2012-02-15 NOTE — ED Notes (Signed)
Pulled both pregnancy and non-pregnancy med kids for SANE nurse (pregnancy test will be done upstairs with SANE nurse); SANE nurse to return medication packet that she does not use.  SANE kit retrieved from central supply and given to SANE nurse.  SANE nurse currently talking with PA.
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
## 2012-02-15 NOTE — ED Notes (Signed)
Patient going upstairs with SANE nurse.
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
## 2012-02-15 NOTE — ED Notes (Signed)
SANE nurse called and stated that she is currently upstairs; will be down to get patient.

## 2012-02-15 NOTE — ED Notes (Signed)
SANE nurse at bedside.

## 2012-02-15 NOTE — ED Notes (Signed)
Received bedside report from [place], [place].  Patient currently sitting up in bed; no respiratory or acute distress noted.  [employer] at bedside.  Patient has no questions or concerns at this time; will continue to monitor.

## 2012-02-16 NOTE — ED Provider Notes (Signed)
Medical screening examination/treatment/procedure(s) were performed by non-physician practitioner and as supervising physician I was immediately available for consultation/collaboration.   Clarrisa Kaylor E Emilyn Ruble, MD 02/16/12 1617 

## 2012-02-18 NOTE — SANE Note (Signed)
[phone]  Karen Lutz SANE documented downtime form due to computer difficulty.

## 2012-02-28 ENCOUNTER — Emergency Department (HOSPITAL_COMMUNITY)
Admission: EM | Admit: 2012-02-28 | Discharge: 2012-02-28 | Disposition: A | Discharge status: Home / Self Care | Payer: Self-pay | Attending: Emergency Medicine | Admitting: Emergency Medicine

## 2012-02-28 ENCOUNTER — Encounter (HOSPITAL_COMMUNITY): Payer: Self-pay | Admitting: Emergency Medicine

## 2012-02-28 DIAGNOSIS — B9689 Other specified bacterial agents as the cause of diseases classified elsewhere: Secondary | ICD-10-CM | POA: Insufficient documentation

## 2012-02-28 DIAGNOSIS — N760 Acute vaginitis: Secondary | ICD-10-CM | POA: Insufficient documentation

## 2012-02-28 DIAGNOSIS — A499 Bacterial infection, unspecified: Secondary | ICD-10-CM | POA: Insufficient documentation

## 2012-02-28 LAB — WET PREP, GENITAL
Trich, Wet Prep: NONE SEEN
Yeast Wet Prep HPF POC: NONE SEEN

## 2012-02-28 MED ORDER — METRONIDAZOLE 500 MG PO TABS: 500.0000 mg | ORAL_TABLET | Freq: Two times a day (BID) | ORAL | Status: AC

## 2012-02-28 NOTE — ED Notes (Signed)
PA in with pt 

## 2012-02-28 NOTE — ED Provider Notes (Signed)
History     CSN: [id number]  Arrival date & time 02/28/12  0605   First MD Initiated Contact with Patient 02/28/12 [phone]      Chief Complaint  Patient presents with  . Vaginitis    (Consider location/radiation/quality/duration/timing/severity/associated sxs/prior treatment) HPI Comments: Karen Lutz is a 41 year old G3P3 African American female who presents today with a chief complaint of vaginal itching and burning. She states that she has been experiencing this for the past 3 months. She was given Azithromycin in April for potential GC/Chlamydia due to [facility] WBCs on wet prep. Ms. Karen Lutz states she has also tried Clindamycin vaginal cream and Monistat with relief that was 'short-lived'. She states that her vaginal discharge is yellow and thin. Ms. Karen Lutz also reports mild dysuria. She denies fever, chills, nausea, vomiting, vaginal bleeding, dyspareunia, abdominal pain, increase in vaginal discharge, hematuria, constipation, diarrhea, urinary frequency or urgency. Ms. Karen Lutz denies recent sexual activity, known exposure to STIs, or concern for pregnancy. Her last menstrual period was July 16th and lasted 4 days, which is regular for her. Onset was gradual, course is constant. Nothing makes symptoms worse.   Patient is a 41 y.o. female presenting with vaginal itching. The history is provided by the patient.  Vaginal Itching This is a new problem. The current episode started more than 1 month ago. The problem occurs daily. The problem has been unchanged. Pertinent negatives include no abdominal pain, chest pain, chills, coughing, fever, headaches, myalgias, nausea, rash, sore throat or vomiting. Nothing aggravates the symptoms.    Past Medical History  Diagnosis Date  . Heart murmur     Past Surgical History  Procedure Date  . Cesarean section   . Knee surgery   . Ceaserian   . Cesarean section   . Knee surgery     Family History  Problem Relation Age of Onset  .  Hypertension Other   . Cancer Other     History  Substance Use Topics  . Smoking status: Never Smoker   . Smokeless tobacco: Never Used  . Alcohol Use: No    OB History    Grav Para Term Preterm Abortions TAB SAB Ect Mult Living                  Review of Systems  Constitutional: Negative for fever, chills and appetite change.  HENT: Negative for sore throat and rhinorrhea.   Eyes: Negative for redness.  Respiratory: Negative for cough.   Cardiovascular: Negative for chest pain.  Gastrointestinal: Negative for nausea, vomiting, abdominal pain, diarrhea and constipation.  Genitourinary: Positive for dysuria, vaginal discharge and vaginal pain. Negative for urgency, frequency, hematuria, vaginal bleeding, difficulty urinating, menstrual problem, pelvic pain and dyspareunia.  Musculoskeletal: Negative for myalgias.  Skin: Negative for rash.  Neurological: Negative for headaches.    Allergies  Penicillins  Home Medications   Current Outpatient Rx  Name Route Sig Dispense Refill  . OMEPRAZOLE 20 MG PO CPDR Oral Take 20 mg by mouth daily.    Marland Kitchen PROMETHAZINE HCL 25 MG PO TABS Oral Take 25 mg by mouth every 6 (six) hours as needed. For nausea      BP 118/62  Pulse 79  Temp 98 F (36.7 C)  Resp 16  SpO2 99%  LMP 02/19/2012  Physical Exam  Nursing note and vitals reviewed. Constitutional: She appears well-developed and well-nourished. No distress.  HENT:  Head: Normocephalic and atraumatic.  Right Ear: External ear normal.  Left Ear: External ear  normal.  Eyes: Conjunctivae are normal. Right eye exhibits no discharge. Left eye exhibits no discharge.  Neck: Normal range of motion. Neck supple.  Cardiovascular: Normal rate, regular rhythm and normal heart sounds.   Pulmonary/Chest: Effort normal and breath sounds normal. She has no wheezes. She has no rales.  Abdominal: Soft. Bowel sounds are normal. She exhibits no distension and no mass. There is tenderness. There is  no rebound and no guarding.    Genitourinary: Uterus normal. Pelvic exam was performed with patient supine. There is no rash or lesion on the right labia. There is no rash or lesion on the left labia. Cervix exhibits discharge. Cervix exhibits no motion tenderness and no friability. Right adnexum displays no mass and no tenderness. Left adnexum displays no mass and no tenderness. There is tenderness around the vagina. No erythema or bleeding around the vagina. No foreign body around the vagina. No signs of injury around the vagina. Vaginal discharge found.  Lymphadenopathy:    She has no cervical adenopathy.  Neurological: She is alert.  Skin: Skin is warm and dry.  Psychiatric: She has a normal mood and affect.    ED Course  Procedures (including critical care time)  Labs Reviewed - No data to display No results found.   1. Bacterial vaginosis   2. Vaginitis     6:59 AM Patient seen and examined. Work-up initiated. Pelvic exam with chaperone at bedside.   Vital signs reviewed and are as follows: Filed Vitals:   02/28/12 0612  BP: 118/62  Pulse: 79  Temp: 98 F (36.7 C)  Resp: 16   7:58 AM Wet prep reviewed. Few clue cells. No yeast. Few WBC cells. Patient informed of results. Will treat for BV. More importantly, will give GYN referrals, given recurrent symptoms.   Patient urged to return with worsening pain, fever, other concerns/new symptoms. She verbalizes understanding and agrees with plan.   MDM  Vaginal itching, discharge. Wet prep shows few clue cells. Will treat especially given discharge. There is no significant tenderness or systemic symptoms to suggest PID. No adnexal tenderness. Patient would benefit from GYN follow-up given recurrence of symptoms. She appears well at time of discharge.         [place], [place] 02/28/12 [phone]

## 2012-02-28 NOTE — ED Notes (Signed)
Pt alert, nad, c/o vaginal yeast infection, onset several months ago, OTC remedies no relief, resp even unlabored, skin pwd

## 2012-02-28 NOTE — ED Notes (Signed)
Report given to D. Mock

## 2012-02-29 NOTE — ED Provider Notes (Signed)
Medical screening examination/treatment/procedure(s) were performed by non-physician practitioner and as supervising physician I was immediately available for consultation/collaboration.  Jaeshaun Riva, MD 02/29/12 0804 

## 2012-04-27 ENCOUNTER — Emergency Department (HOSPITAL_COMMUNITY)
Admission: EM | Admit: 2012-04-27 | Discharge: 2012-04-28 | Disposition: A | Discharge status: Home / Self Care | Payer: Self-pay | Attending: Emergency Medicine | Admitting: Emergency Medicine

## 2012-04-27 DIAGNOSIS — S0500XA Injury of conjunctiva and corneal abrasion without foreign body, unspecified eye, initial encounter: Secondary | ICD-10-CM

## 2012-04-27 DIAGNOSIS — S058X9A Other injuries of unspecified eye and orbit, initial encounter: Secondary | ICD-10-CM | POA: Insufficient documentation

## 2012-04-27 DIAGNOSIS — H109 Unspecified conjunctivitis: Secondary | ICD-10-CM | POA: Insufficient documentation

## 2012-04-27 DIAGNOSIS — X58XXXA Exposure to other specified factors, initial encounter: Secondary | ICD-10-CM | POA: Insufficient documentation

## 2012-04-27 DIAGNOSIS — R011 Cardiac murmur, unspecified: Secondary | ICD-10-CM | POA: Insufficient documentation

## 2012-04-27 DIAGNOSIS — H5710 Ocular pain, unspecified eye: Secondary | ICD-10-CM | POA: Insufficient documentation

## 2012-04-27 NOTE — ED Notes (Signed)
Pt c/o FB to R eye since yesterday. Pt states she has tried to "wash it out" Pt states now eye is swollen with green drainage.

## 2012-04-28 ENCOUNTER — Emergency Department (HOSPITAL_COMMUNITY): Payer: Self-pay

## 2012-04-28 LAB — BASIC METABOLIC PANEL
CO2: 23 mEq/L (ref 19–32)
Calcium: 9.1 mg/dL (ref 8.4–10.5)
GFR calc Af Amer: >90 mL/min (ref >90)
GFR calc non Af Amer: >90 mL/min (ref >90)
Sodium: 138 mEq/L (ref 135–145)

## 2012-04-28 LAB — CBC WITH DIFFERENTIAL/PLATELET
Basophils Absolute: 0.1 10*3/uL (ref 0.0–0.1)
Eosinophils Relative: 1 % (ref 0–5)
Monocytes Relative: 13 % — ABNORMAL HIGH (ref 3–12)
Neutrophils Relative %: 49 % (ref 43–77)
Platelets: 278 10*3/uL (ref 150–400)
RBC: 4 MIL/uL (ref 3.87–5.11)
RDW: 18.9 % — ABNORMAL HIGH (ref 11.5–15.5)
WBC: 6.1 10*3/uL (ref 4.0–10.5)

## 2012-04-28 MED ORDER — HYDROCODONE-ACETAMINOPHEN 5-325 MG PO TABS: 1.0000 | ORAL_TABLET | Freq: Four times a day (QID) | ORAL | Status: AC | PRN

## 2012-04-28 MED ORDER — DEXTROSE 5 % IV SOLN
2.0000 g | Freq: Once | INTRAVENOUS | Status: AC
  Administered 2012-04-28: 2 g via INTRAVENOUS
  Filled 2012-04-28: qty 2

## 2012-04-28 MED ORDER — GATIFLOXACIN 0.5 % OP SOLN
2.0000 [drp] | OPHTHALMIC | Status: DC
  Administered 2012-04-28: 2 [drp] via OPHTHALMIC
  Filled 2012-04-28: qty 2.5

## 2012-04-28 MED ORDER — FENTANYL CITRATE 0.05 MG/ML IJ SOLN
100.0000 ug | Freq: Once | INTRAMUSCULAR | Status: AC
  Administered 2012-04-28: 100 ug via INTRAVENOUS
  Filled 2012-04-28: qty 2

## 2012-04-28 MED ORDER — CEPHALEXIN 500 MG PO CAPS: 500.0000 mg | ORAL_CAPSULE | Freq: Four times a day (QID) | ORAL | Status: DC

## 2012-04-28 MED ORDER — FLUORESCEIN SODIUM 1 MG OP STRP
1.0000 | ORAL_STRIP | Freq: Once | OPHTHALMIC | Status: AC
  Administered 2012-04-28: 1 via OPHTHALMIC
  Filled 2012-04-28: qty 1

## 2012-04-28 MED ORDER — IOHEXOL 300 MG/ML  SOLN
100.0000 mL | Freq: Once | INTRAMUSCULAR | Status: AC | PRN
  Administered 2012-04-28: 100 mL via INTRAVENOUS

## 2012-04-28 MED ORDER — SODIUM CHLORIDE 0.9 % IV SOLN
INTRAVENOUS | Status: DC
  Administered 2012-04-28: 01:00:00 via INTRAVENOUS

## 2012-04-28 MED ORDER — TETRACAINE HCL 0.5 % OP SOLN
2.0000 [drp] | Freq: Once | OPHTHALMIC | Status: AC
  Administered 2012-04-28: 2 [drp] via OPHTHALMIC
  Filled 2012-04-28: qty 2

## 2012-04-28 NOTE — ED Notes (Signed)
MD at bedside. 

## 2012-04-28 NOTE — ED Notes (Signed)
Returned from CT.

## 2012-04-28 NOTE — ED Notes (Signed)
Attempted to call pt's family to come pick her up  No answer at this time

## 2012-04-28 NOTE — ED Provider Notes (Addendum)
History     CSN: [id number]  Arrival date & time 04/27/12  2256   First MD Initiated Contact with Patient 04/27/12 2348      Chief Complaint  Patient presents with  . Eye Pain    (Consider location/radiation/quality/duration/timing/severity/associated sxs/prior treatment) HPI This is a 41 year old black female who developed right eye pain about a day and a half ago. She describes the pain as feeling like a rock under her right eyelid. She treated herself with irrigation without relief. The pain is subsequently worsened. She feels pain both in the eye and surrounding the eye. There is redness of the eye with blurred vision and green drainage. The pain is worsened with palpation of the eye and adnexa or with bright light. She is not aware of any injury to her eye and is not aware of any foreign body. She describes the pain as severe.  Past Medical History  Diagnosis Date  . Heart murmur     Past Surgical History  Procedure Date  . Cesarean section   . Knee surgery   . Ceaserian   . Cesarean section   . Knee surgery     Family History  Problem Relation Age of Onset  . Hypertension Other   . Cancer Other     History  Substance Use Topics  . Smoking status: Never Smoker   . Smokeless tobacco: Never Used  . Alcohol Use: No    OB History    Grav Para Term Preterm Abortions TAB SAB Ect Mult Living                  Review of Systems  All other systems reviewed and are negative.    Allergies  Penicillins  Home Medications   Current Outpatient Rx  Name Route Sig Dispense Refill  . OMEPRAZOLE 20 MG PO CPDR Oral Take 20 mg by mouth daily.    Marland Kitchen PROMETHAZINE HCL 25 MG PO TABS Oral Take 25 mg by mouth every 6 (six) hours as needed. Nausea      BP 125/52  Pulse 104  Temp 98.9 F (37.2 C) (Oral)  Resp 19  SpO2 100%  Physical Exam General: Well-developed, well-nourished female in obvious discomfort; appearance consistent with age of record HENT: normocephalic,  atraumatic Eyes: pupils equal round and reactive to light; extraocular muscles intact; no hyphema; no hypopyon; right conjunctival inflammation with greenish exudate; erythema and tenderness of soft tissue surrounding right eye; right corneal abrasion seen on fluorescein/Wood's lamp examination of right eye Neck: supple Heart: regular rate and rhythm Lungs: clear to auscultation bilaterally Abdomen: soft; nondistended Extremities: No deformity; full range of motion Neurologic: Awake, alert and oriented; motor function intact in all extremities and symmetric; no facial droop Skin: Warm and dry Psychiatric: Agitated; anxious    ED Course  Procedures (including critical care time)     MDM   Nursing notes and vitals signs, including pulse oximetry, reviewed.  Summary of this visit's results, reviewed by myself:  Labs:  Results for orders placed during the hospital encounter of 04/27/12  BASIC METABOLIC PANEL      Component Value Range   Sodium 138  135 - 145 mEq/L   Potassium 2.9 (*) 3.5 - 5.1 mEq/L   Chloride 101  96 - 112 mEq/L   CO2 23  19 - 32 mEq/L   Glucose, Bld 114 (*) 70 - 99 mg/dL   BUN 11  6 - 23 mg/dL   Creatinine, Ser 1.19  0.50 -  1.10 mg/dL   Calcium 9.1  8.4 - 16.1 mg/dL   GFR calc non Af Amer >90  >90 mL/min   GFR calc Af Amer >90  >90 mL/min  CBC WITH DIFFERENTIAL      Component Value Range   WBC 6.1  4.0 - 10.5 K/uL   RBC 4.00  3.87 - 5.11 MIL/uL   Hemoglobin 6.8 (*) 12.0 - 15.0 g/dL   HCT 09.6 (*) 04.5 - 40.9 %   MCV 60.0 (*) 78.0 - 100.0 fL   MCH 17.0 (*) 26.0 - 34.0 pg   MCHC 28.3 (*) 30.0 - 36.0 g/dL   RDW 81.1 (*) 91.4 - 78.2 %   Platelets 278  150 - 400 K/uL   Neutrophils Relative 49  43 - 77 %   Lymphocytes Relative 36  12 - 46 %   Monocytes Relative 13 (*) 3 - 12 %   Eosinophils Relative 1  0 - 5 %   Basophils Relative 1  0 - 1 %   Neutro Abs 2.9  1.7 - 7.7 K/uL   Lymphs Abs 2.2  0.7 - 4.0 K/uL   Monocytes Absolute 0.8  0.1 - 1.0 K/uL    Eosinophils Absolute 0.1  0.0 - 0.7 K/uL   Basophils Absolute 0.1  0.0 - 0.1 K/uL   RBC Morphology OVALOCYTES      Imaging Studies: Ct Orbits W/cm  04/28/2012  *RADIOLOGY REPORT*  Clinical Data: Right ocular pain.  Foreign body.  CT ORBITS WITH CONTRAST  Technique:  Multidetector CT imaging of the orbits was performed following the bolus administration of intravenous contrast.  Contrast: OMNIPAQUE IOHEXOL 300 MG/ML  SOLN  Comparison: None.  Findings: The exam is technically degraded by motion artifacts. Motion is present in the supraorbital and orbital region of the scan.   There is no radiopaque orbital foreign body identified. Incidental visualization of the brain is within normal limits. Disconjugate gaze is incidentally noted.  Scattered ethmoid sinus disease.  No orbital or periorbital cellulitis is identified.  The mandibular condyles show anterior translation at the time of imaging.  Visualized craniocervical junction appears normal.  The mastoid air cells are hypoplastic  IMPRESSION: 1.  No radiopaque orbital foreign body identified. 2.  Motion degraded study. Mild ethmoid mucosal sinus disease.   Original Report Authenticated By: Andreas Newport, M.D.    3:33 AM We will treat for corneal abrasion and conjunctivitis. Because of the erythema surrounding the right eye we will treat for possible early periorbital cellulitis. We will have patient followup with Dr. Luciana Axe later this morning.          Hanley Seamen, MD 04/28/12 9562  Hanley Seamen, MD 04/28/12 [phone]

## 2012-04-28 NOTE — ED Notes (Signed)
Report to Lisa, RN

## 2012-04-29 ENCOUNTER — Encounter (HOSPITAL_COMMUNITY): Payer: Self-pay | Admitting: *Deleted

## 2012-04-29 ENCOUNTER — Emergency Department (HOSPITAL_COMMUNITY)
Admission: EM | Admit: 2012-04-29 | Discharge: 2012-04-29 | Disposition: A | Discharge status: Home / Self Care | Payer: Self-pay | Attending: Emergency Medicine | Admitting: Emergency Medicine

## 2012-04-29 DIAGNOSIS — B001 Herpesviral vesicular dermatitis: Secondary | ICD-10-CM

## 2012-04-29 DIAGNOSIS — B0050 Herpesviral ocular disease, unspecified: Secondary | ICD-10-CM | POA: Insufficient documentation

## 2012-04-29 DIAGNOSIS — Z8249 Family history of ischemic heart disease and other diseases of the circulatory system: Secondary | ICD-10-CM | POA: Insufficient documentation

## 2012-04-29 DIAGNOSIS — Z809 Family history of malignant neoplasm, unspecified: Secondary | ICD-10-CM | POA: Insufficient documentation

## 2012-04-29 DIAGNOSIS — Z880 Allergy status to penicillin: Secondary | ICD-10-CM | POA: Insufficient documentation

## 2012-04-29 DIAGNOSIS — B009 Herpesviral infection, unspecified: Secondary | ICD-10-CM | POA: Insufficient documentation

## 2012-04-29 MED ORDER — ACYCLOVIR 800 MG PO TABS: 800.0000 mg | ORAL_TABLET | Freq: Every day | ORAL | Status: DC

## 2012-04-29 MED ORDER — FLUORESCEIN SODIUM 1 MG OP STRP
ORAL_STRIP | OPHTHALMIC | Status: AC
  Administered 2012-04-29: 1 via OPHTHALMIC
  Filled 2012-04-29: qty 1

## 2012-04-29 MED ORDER — PROPARACAINE HCL 0.5 % OP SOLN
1.0000 [drp] | Freq: Once | OPHTHALMIC | Status: AC
  Administered 2012-04-29: 1 [drp] via OPHTHALMIC

## 2012-04-29 MED ORDER — ACYCLOVIR 200 MG PO CAPS
400.0000 mg | ORAL_CAPSULE | Freq: Once | ORAL | Status: AC
  Administered 2012-04-29: 400 mg via ORAL
  Filled 2012-04-29: qty 2

## 2012-04-29 MED ORDER — TRAMADOL HCL 50 MG PO TABS: 50.0000 mg | ORAL_TABLET | Freq: Four times a day (QID) | ORAL | Status: DC | PRN

## 2012-04-29 MED ORDER — TRAMADOL HCL 50 MG PO TABS
50.0000 mg | ORAL_TABLET | Freq: Once | ORAL | Status: AC
  Administered 2012-04-29: 50 mg via ORAL
  Filled 2012-04-29: qty 1

## 2012-04-29 MED ORDER — FLUORESCEIN SODIUM 1 MG OP STRP
1.0000 | ORAL_STRIP | Freq: Once | OPHTHALMIC | Status: AC
  Administered 2012-04-29: 1 via OPHTHALMIC

## 2012-04-29 MED ORDER — PROPARACAINE HCL 0.5 % OP SOLN
OPHTHALMIC | Status: AC
  Administered 2012-04-29: 1 [drp] via OPHTHALMIC
  Filled 2012-04-29: qty 15

## 2012-04-29 NOTE — ED Notes (Signed)
Pt was here on Sunday for R eye swelling, was told cut on cornea, but was unable to get prescriptions that she was sent home with. States eye has been draining down around mouth area and now she has a rash around mouth. R eye is red, swollen shut, red rash around mouth area. Denies difficulty breathing. Pt states very painful.

## 2012-04-29 NOTE — ED Provider Notes (Signed)
History     CSN: [id number]  Arrival date & time 04/29/12  0911   First MD Initiated Contact with Patient 04/29/12 0940      No chief complaint on file.   (Consider location/radiation/quality/duration/timing/severity/associated sxs/prior treatment) HPI Pt with new rash involving R eye and upper lip. Seen a couple of days ago and thought to have conjunctivitis, periorbital cellulitis and corneal abrasion. Started on abx and pt has follow up appointment with Dr Luciana Axe today at 1200. No fever chills. +photophobia and eye d/c. Blurred vision. +pain of upper lip. Pt had sexual encounter this weekend with a female who denied previous sexually transmitted diseases.  Past Medical History  Diagnosis Date  . Heart murmur     Past Surgical History  Procedure Date  . Cesarean section   . Knee surgery   . Ceaserian   . Cesarean section   . Knee surgery     Family History  Problem Relation Age of Onset  . Hypertension Other   . Cancer Other     History  Substance Use Topics  . Smoking status: Never Smoker   . Smokeless tobacco: Never Used  . Alcohol Use: No    OB History    Grav Para Term Preterm Abortions TAB SAB Ect Mult Living                  Review of Systems  Constitutional: Negative for fever and chills.  HENT: Positive for facial swelling.   Eyes: Positive for photophobia, pain and redness.  Skin: Positive for color change and rash. Negative for wound.  Neurological: Negative for weakness and numbness.    Allergies  Penicillins  Home Medications   Current Outpatient Rx  Name Route Sig Dispense Refill  . ACYCLOVIR 800 MG PO TABS Oral Take 1 tablet (800 mg total) by mouth 5 (five) times daily. 50 tablet 0  . CEPHALEXIN 500 MG PO CAPS Oral Take 1 capsule (500 mg total) by mouth 4 (four) times daily. 28 capsule 0  . HYDROCODONE-ACETAMINOPHEN 5-325 MG PO TABS Oral Take 1-2 tablets by mouth every 6 (six) hours as needed for pain. 20 tablet 0  . OMEPRAZOLE 20 MG  PO CPDR Oral Take 20 mg by mouth daily.    Marland Kitchen PROMETHAZINE HCL 25 MG PO TABS Oral Take 25 mg by mouth every 6 (six) hours as needed. Nausea    . TRAMADOL HCL 50 MG PO TABS Oral Take 1 tablet (50 mg total) by mouth every 6 (six) hours as needed for pain. 15 tablet 0    BP 115/73  Pulse 81  Temp 99 F (37.2 C)  Resp 20  SpO2 99%  LMP 04/19/2012  Physical Exam  Nursing note and vitals reviewed. Constitutional: She is oriented to person, place, and time. She appears well-developed and well-nourished. No distress.  HENT:  Head: Normocephalic and atraumatic.  Mouth/Throat: Oropharynx is clear and moist.    Eyes: EOM are normal. Pupils are equal, round, and reactive to light. Left eye exhibits discharge. Scleral icterus is present.    Neck: Normal range of motion. Neck supple.  Cardiovascular: Normal rate and regular rhythm.   Pulmonary/Chest: Effort normal and breath sounds normal. No respiratory distress. She has no wheezes. She has no rales.  Abdominal: Soft. Bowel sounds are normal. There is no tenderness. There is no rebound and no guarding.  Musculoskeletal: Normal range of motion. She exhibits no edema and no tenderness.  Neurological: She is alert and oriented to  person, place, and time.       No motor or sensory deficits.   Skin: Skin is warm and dry. Rash noted. There is erythema.       See HEENT and eye exam  Psychiatric: She has a normal mood and affect. Her behavior is normal.    ED Course  Procedures (including critical care time)  Labs Reviewed - No data to display Ct Orbits W/cm  04/28/2012  *RADIOLOGY REPORT*  Clinical Data: Right ocular pain.  Foreign body.  CT ORBITS WITH CONTRAST  Technique:  Multidetector CT imaging of the orbits was performed following the bolus administration of intravenous contrast.  Contrast: OMNIPAQUE IOHEXOL 300 MG/ML  SOLN  Comparison: None.  Findings: The exam is technically degraded by motion artifacts. Motion is present in the  supraorbital and orbital region of the scan.   There is no radiopaque orbital foreign body identified. Incidental visualization of the brain is within normal limits. Disconjugate gaze is incidentally noted.  Scattered ethmoid sinus disease.  No orbital or periorbital cellulitis is identified.  The mandibular condyles show anterior translation at the time of imaging.  Visualized craniocervical junction appears normal.  The mastoid air cells are hypoplastic  IMPRESSION: 1.  No radiopaque orbital foreign body identified. 2.  Motion degraded study. Mild ethmoid mucosal sinus disease.   Original Report Authenticated By: Andreas Newport, M.D.      1. Herpes simplex labialis   2. Ophthalmic herpes simplex       MDM  Discussed with pharmacy regarding acyclovir dosing. Given eye involvement will dose at 800 mg 5 times daily. Doubt this is Zoster. Discussed with Dr Luciana Axe. Aware of recent changes. Will see in office at noon. Pt aware of plan. Return for worsening symptoms.         Loren Racer, MD 04/29/12 1104

## 2012-04-30 LAB — EYE CULTURE

## 2012-05-02 NOTE — ED Notes (Signed)
Eye culture-patient is to f/u with Dr Luciana Axe.

## 2013-07-02 ENCOUNTER — Encounter (HOSPITAL_COMMUNITY): Payer: Self-pay | Admitting: Emergency Medicine

## 2013-07-02 ENCOUNTER — Emergency Department (HOSPITAL_COMMUNITY)
Admission: EM | Admit: 2013-07-02 | Discharge: 2013-07-03 | Disposition: A | Discharge status: Home / Self Care | Payer: Medicaid Other | Attending: Emergency Medicine | Admitting: Emergency Medicine

## 2013-07-02 DIAGNOSIS — Z792 Long term (current) use of antibiotics: Secondary | ICD-10-CM | POA: Insufficient documentation

## 2013-07-02 DIAGNOSIS — G43909 Migraine, unspecified, not intractable, without status migrainosus: Secondary | ICD-10-CM | POA: Insufficient documentation

## 2013-07-02 DIAGNOSIS — D649 Anemia, unspecified: Secondary | ICD-10-CM | POA: Insufficient documentation

## 2013-07-02 DIAGNOSIS — Z79899 Other long term (current) drug therapy: Secondary | ICD-10-CM | POA: Insufficient documentation

## 2013-07-02 DIAGNOSIS — R011 Cardiac murmur, unspecified: Secondary | ICD-10-CM | POA: Insufficient documentation

## 2013-07-02 DIAGNOSIS — Z880 Allergy status to penicillin: Secondary | ICD-10-CM | POA: Insufficient documentation

## 2013-07-02 DIAGNOSIS — R1013 Epigastric pain: Secondary | ICD-10-CM | POA: Insufficient documentation

## 2013-07-02 HISTORY — DX: Anemia, unspecified: D64.9

## 2013-07-02 NOTE — ED Notes (Signed)
Pt c/o mid abd pain since yesterday; nausea no vomiting today; normal bm today

## 2013-07-03 ENCOUNTER — Emergency Department (HOSPITAL_COMMUNITY): Payer: Self-pay

## 2013-07-03 ENCOUNTER — Encounter (HOSPITAL_COMMUNITY): Payer: Self-pay | Admitting: Emergency Medicine

## 2013-07-03 ENCOUNTER — Emergency Department (HOSPITAL_COMMUNITY)
Admission: EM | Admit: 2013-07-03 | Discharge: 2013-07-03 | Disposition: A | Discharge status: Home / Self Care | Payer: Self-pay | Attending: Emergency Medicine | Admitting: Emergency Medicine

## 2013-07-03 DIAGNOSIS — D649 Anemia, unspecified: Secondary | ICD-10-CM | POA: Diagnosis present

## 2013-07-03 DIAGNOSIS — Z79899 Other long term (current) drug therapy: Secondary | ICD-10-CM | POA: Insufficient documentation

## 2013-07-03 DIAGNOSIS — Z880 Allergy status to penicillin: Secondary | ICD-10-CM | POA: Insufficient documentation

## 2013-07-03 DIAGNOSIS — R630 Anorexia: Secondary | ICD-10-CM | POA: Insufficient documentation

## 2013-07-03 DIAGNOSIS — R011 Cardiac murmur, unspecified: Secondary | ICD-10-CM | POA: Insufficient documentation

## 2013-07-03 DIAGNOSIS — N920 Excessive and frequent menstruation with regular cycle: Secondary | ICD-10-CM | POA: Insufficient documentation

## 2013-07-03 DIAGNOSIS — R1013 Epigastric pain: Secondary | ICD-10-CM | POA: Diagnosis present

## 2013-07-03 DIAGNOSIS — R51 Headache: Secondary | ICD-10-CM | POA: Insufficient documentation

## 2013-07-03 DIAGNOSIS — N898 Other specified noninflammatory disorders of vagina: Secondary | ICD-10-CM | POA: Insufficient documentation

## 2013-07-03 DIAGNOSIS — G43909 Migraine, unspecified, not intractable, without status migrainosus: Secondary | ICD-10-CM | POA: Diagnosis present

## 2013-07-03 LAB — ETHANOL: Alcohol, Ethyl (B): <11 mg/dL (ref 0–11)

## 2013-07-03 LAB — CBC WITH DIFFERENTIAL/PLATELET
Basophils Absolute: 0 10*3/uL (ref 0.0–0.1)
Basophils Absolute: 0 10*3/uL (ref 0.0–0.1)
Basophils Relative: 0 % (ref 0–1)
Basophils Relative: 1 % (ref 0–1)
Eosinophils Absolute: 0 10*3/uL (ref 0.0–0.7)
Eosinophils Relative: 2 % (ref 0–5)
HCT: 25.5 % — ABNORMAL LOW (ref 36.0–46.0)
HCT: 26.1 % — ABNORMAL LOW (ref 36.0–46.0)
Hemoglobin: 7.1 g/dL — ABNORMAL LOW (ref 12.0–15.0)
Lymphocytes Relative: 38 % (ref 12–46)
Lymphocytes Relative: 51 % — ABNORMAL HIGH (ref 12–46)
MCH: 16.9 pg — ABNORMAL LOW (ref 26.0–34.0)
MCHC: 27.5 g/dL — ABNORMAL LOW (ref 30.0–36.0)
Monocytes Absolute: 0.3 10*3/uL (ref 0.1–1.0)
Monocytes Relative: 11 % (ref 3–12)
Neutro Abs: 1.1 10*3/uL — ABNORMAL LOW (ref 1.7–7.7)
Neutro Abs: 1.5 10*3/uL — ABNORMAL LOW (ref 1.7–7.7)
Neutrophils Relative %: 36 % — ABNORMAL LOW (ref 43–77)
Neutrophils Relative %: 50 % (ref 43–77)
RBC: 4.24 MIL/uL (ref 3.87–5.11)
RDW: 20.3 % — ABNORMAL HIGH (ref 11.5–15.5)
RDW: 20.3 % — ABNORMAL HIGH (ref 11.5–15.5)
WBC: 3 10*3/uL — ABNORMAL LOW (ref 4.0–10.5)

## 2013-07-03 LAB — COMPREHENSIVE METABOLIC PANEL
Albumin: 3.3 g/dL — ABNORMAL LOW (ref 3.5–5.2)
Alkaline Phosphatase: 67 U/L (ref 39–117)
BUN: 12 mg/dL (ref 6–23)
Calcium: 9 mg/dL (ref 8.4–10.5)
GFR calc Af Amer: >90 mL/min (ref >90)
Glucose, Bld: 104 mg/dL — ABNORMAL HIGH (ref 70–99)
Potassium: 3.6 mEq/L (ref 3.5–5.1)
Total Protein: 7.1 g/dL (ref 6.0–8.3)

## 2013-07-03 LAB — URINALYSIS, ROUTINE W REFLEX MICROSCOPIC
Glucose, UA: NEGATIVE mg/dL
Leukocytes, UA: NEGATIVE
Protein, ur: NEGATIVE mg/dL
Urobilinogen, UA: 1 mg/dL (ref 0.0–1.0)

## 2013-07-03 LAB — LIPASE, BLOOD: Lipase: 45 U/L (ref 11–59)

## 2013-07-03 MED ORDER — SODIUM CHLORIDE 0.9 % IV BOLUS (SEPSIS)
1000.0000 mL | INTRAVENOUS | Status: AC
  Administered 2013-07-03: 1000 mL via INTRAVENOUS

## 2013-07-03 MED ORDER — TRAMADOL HCL 50 MG PO TABS: 50.0000 mg | ORAL_TABLET | Freq: Four times a day (QID) | ORAL | Status: DC | PRN

## 2013-07-03 MED ORDER — DIPHENHYDRAMINE HCL 50 MG/ML IJ SOLN
25.0000 mg | Freq: Once | INTRAMUSCULAR | Status: AC
  Administered 2013-07-03: 25 mg via INTRAVENOUS
  Filled 2013-07-03: qty 1

## 2013-07-03 MED ORDER — PANTOPRAZOLE SODIUM 20 MG PO TBEC: 20.0000 mg | DELAYED_RELEASE_TABLET | Freq: Every day | ORAL | Status: DC

## 2013-07-03 MED ORDER — SODIUM CHLORIDE 0.9 % IV BOLUS (SEPSIS)
500.0000 mL | Freq: Once | INTRAVENOUS | Status: AC
  Administered 2013-07-03: 500 mL via INTRAVENOUS

## 2013-07-03 MED ORDER — PANTOPRAZOLE SODIUM 40 MG IV SOLR
40.0000 mg | Freq: Once | INTRAVENOUS | Status: AC
  Administered 2013-07-03: 40 mg via INTRAVENOUS
  Filled 2013-07-03: qty 40

## 2013-07-03 MED ORDER — METOCLOPRAMIDE HCL 5 MG/ML IJ SOLN
5.0000 mg | Freq: Once | INTRAMUSCULAR | Status: AC
  Administered 2013-07-03: 5 mg via INTRAVENOUS
  Filled 2013-07-03: qty 2

## 2013-07-03 MED ORDER — ALUM & MAG HYDROXIDE-SIMETH 200-200-20 MG/5ML PO SUSP
30.0000 mL | Freq: Once | ORAL | Status: AC
  Administered 2013-07-03: 30 mL via ORAL
  Filled 2013-07-03: qty 30

## 2013-07-03 MED ORDER — METOCLOPRAMIDE HCL 5 MG/ML IJ SOLN
10.0000 mg | Freq: Once | INTRAMUSCULAR | Status: AC
  Administered 2013-07-03: 10 mg via INTRAVENOUS
  Filled 2013-07-03: qty 2

## 2013-07-03 NOTE — ED Provider Notes (Signed)
CSN: [id number]     Arrival date & time 07/02/13  2331 History   First MD Initiated Contact with Patient 07/02/13 2336     Chief Complaint  Patient presents with  . Abdominal Pain   (Consider location/radiation/quality/duration/timing/severity/associated sxs/prior Treatment) Patient is a 42 y.o. female presenting with abdominal pain. The history is provided by the patient.  Abdominal Pain Pain location:  Epigastric Pain quality: sharp   Pain radiates to:  Does not radiate Pain severity:  Moderate Onset quality:  Gradual Duration:  1 day Timing:  Constant Progression:  Unchanged Chronicity:  New Context: eating   Context: not alcohol use   Relieved by:  Nothing Worsened by:  Nothing tried Ineffective treatments:  Acetaminophen and NSAIDs Associated symptoms: nausea   Associated symptoms: no chest pain, no cough, no diarrhea, no dysuria, no fatigue, no fever, no hematuria, no shortness of breath and no vomiting     Past Medical History  Diagnosis Date  . Heart murmur    Past Surgical History  Procedure Laterality Date  . Cesarean section    . Knee surgery    . Ceaserian    . Cesarean section    . Knee surgery     Family History  Problem Relation Age of Onset  . Hypertension Other   . Cancer Other    History  Substance Use Topics  . Smoking status: Never Smoker   . Smokeless tobacco: Never Used  . Alcohol Use: No   OB History   Grav Para Term Preterm Abortions TAB SAB Ect Mult Living                 Review of Systems  Constitutional: Negative for fever and fatigue.  HENT: Negative for congestion and drooling.   Eyes: Negative for pain.  Respiratory: Negative for cough and shortness of breath.   Cardiovascular: Negative for chest pain.  Gastrointestinal: Positive for nausea and abdominal pain. Negative for vomiting and diarrhea.  Genitourinary: Negative for dysuria and hematuria.  Musculoskeletal: Negative for back pain, gait problem and neck pain.  Skin:  Negative for color change.  Neurological: Negative for dizziness and headaches.  Hematological: Negative for adenopathy.  Psychiatric/Behavioral: Negative for behavioral problems.  All other systems reviewed and are negative.    Allergies  Penicillins  Home Medications   Current Outpatient Rx  Name  Route  Sig  Dispense  Refill  . acyclovir (ZOVIRAX) 800 MG tablet   Oral   Take 1 tablet (800 mg total) by mouth 5 (five) times daily.   50 tablet   0   . cephALEXin (KEFLEX) 500 MG capsule   Oral   Take 1 capsule (500 mg total) by mouth 4 (four) times daily.   28 capsule   0   . omeprazole (PRILOSEC) 20 MG capsule   Oral   Take 20 mg by mouth daily.         . promethazine (PHENERGAN) 25 MG tablet   Oral   Take 25 mg by mouth every 6 (six) hours as needed. Nausea         . traMADol (ULTRAM) 50 MG tablet   Oral   Take 1 tablet (50 mg total) by mouth every 6 (six) hours as needed for pain.   15 tablet   0    BP 128/75  Pulse 78  Temp(Src) 97.8 F (36.6 C)  Resp 24  Wt 235 lb (106.595 kg)  SpO2 100%  LMP 06/25/2013 Physical Exam  Nursing  note and vitals reviewed. Constitutional: She is oriented to person, place, and time. She appears well-developed and well-nourished.  HENT:  Head: Normocephalic.  Mouth/Throat: Oropharynx is clear and moist. No oropharyngeal exudate.  Eyes: Conjunctivae and EOM are normal. Pupils are equal, round, and reactive to light.  Neck: Normal range of motion. Neck supple.  Cardiovascular: Normal rate, regular rhythm, normal heart sounds and intact distal pulses.  Exam reveals no gallop and no friction rub.   No murmur heard. Pulmonary/Chest: Effort normal and breath sounds normal. No respiratory distress. She has no wheezes.  Abdominal: Soft. Bowel sounds are normal. She exhibits no distension and no mass. There is tenderness (mild ttp of epigastric area). There is no rebound and no guarding.  Musculoskeletal: Normal range of motion.  She exhibits no edema and no tenderness.  Neurological: She is alert and oriented to person, place, and time. She has normal strength. No sensory deficit.  Skin: Skin is warm and dry.  Psychiatric: She has a normal mood and affect. Her behavior is normal.    ED Course  Procedures (including critical care time) Labs Review Labs Reviewed  CBC WITH DIFFERENTIAL - Abnormal; Notable for the following:    WBC 2.9 (*)    Hemoglobin 7.0 (*)    HCT 25.5 (*)    MCV 61.4 (*)    MCH 16.9 (*)    MCHC 27.5 (*)    RDW 20.3 (*)    Neutro Abs 1.5 (*)    All other components within normal limits  COMPREHENSIVE METABOLIC PANEL - Abnormal; Notable for the following:    Sodium 134 (*)    Glucose, Bld 104 (*)    Albumin 3.3 (*)    Total Bilirubin 0.2 (*)    All other components within normal limits  ETHANOL  LIPASE, BLOOD  URINE RAPID DRUG SCREEN (HOSP PERFORMED)   Imaging Review No results found.  EKG Interpretation   None       MDM   1. Epigastric pain   2. Anemia   3. Migraine    12:01 AM 42 y.o. female who presents with waxing waning headache for the last 2-3 days as well as sharp constant epigastric pain since yesterday. The patient states that her abdominal pain is worse with eating or drinking. She denies having this pain in the past. She denies any vomiting or diarrhea, has had some mild nausea. She notes that she has a history of migraines in her headache now is consistent with previous migraines. Will get screening labwork. Will treat with migraine cocktail and Maalox for abdominal pain.  Patient has a long-standing history of epigastric discomfort without known cause. Has been seen here for similar sx multiple times in the past w/ neg [place] of abd (2013) and CT of abd (2011). Was referred to GI, did not f/u.   1:49 AM: Pt feeling better after migraine cocktail and maalox. Found to be anemic. Denies bloody or dark stools. Has a long hx of anemia w/out known cause. Has required blood  transfusions in the past. She denies sob, cp, dizziness, weakness. I told her to f/u w/ her doctor for recheck of Hgb in 2-3 days and return for any of these sx. She understands. Will also rec f/u w/ GI for her epig pain.  I have discussed the diagnosis/risks/treatment options with the patient and believe the pt to be eligible for discharge home to follow-up with pcp/gi. We also discussed returning to the ED immediately if new or worsening  sx occur. We discussed the sx which are most concerning (e.g., sob, cp, weakness, dizziness, worsening pain, fever) that necessitate immediate return. Any new prescriptions provided to the patient are listed below.  New Prescriptions   PANTOPRAZOLE (PROTONIX) 20 MG TABLET    Take 1 tablet (20 mg total) by mouth daily.   TRAMADOL (ULTRAM) 50 MG TABLET    Take 1 tablet (50 mg total) by mouth every 6 (six) hours as needed.      Junius Argyle, MD 07/03/13 [phone]

## 2013-07-03 NOTE — ED Notes (Signed)
Pt states she was here last night.  States that she has been having mid abd pain, nausea, and frontal headache since Wednesday.

## 2013-07-03 NOTE — ED Notes (Signed)
Ultrasound at bedside

## 2013-07-03 NOTE — ED Notes (Signed)
Patient is alert and oriented x3.  She was given DC instructions and follow up visit instructions.  Patient gave verbal understanding. She was DC ambulatory under her own power to home.  V/S stable.  He was not showing any signs of distress on DC 

## 2013-07-03 NOTE — ED Provider Notes (Signed)
CSN: [id number]     Arrival date & time 07/03/13  1403 History   First MD Initiated Contact with Patient 07/03/13 1502     Chief Complaint  Patient presents with  . Headache  . Abdominal Pain   (Consider location/radiation/quality/duration/timing/severity/associated sxs/prior Treatment) Patient is a 42 y.o. female presenting with headaches and abdominal pain. The history is provided by the patient and medical records. No language interpreter was used.  Headache Pain location:  Frontal Quality:  Dull Radiates to:  Does not radiate Onset quality:  Gradual Duration:  2 days Timing:  Intermittent Progression:  Waxing and waning Chronicity:  Recurrent Similar to prior headaches: yes   Ineffective treatments:  NSAIDs Associated symptoms: abdominal pain and nausea   Associated symptoms: no photophobia and no vomiting   Abdominal pain:    Location:  Epigastric   Quality:  Throbbing   Severity:  Moderate   Onset quality:  Gradual   Duration:  3 days   Timing:  Constant   Progression:  Worsening   Chronicity:  New Abdominal Pain Associated symptoms: nausea and vaginal bleeding   Associated symptoms: no vomiting    Patient seen last night for same symptoms.  Headache and abdominal pain have returned, patient states she is unable to eat due to the nausea.  No active vomiting.  Patient with history of heavy menstrual cycles, last ending this past Sunday.  Patient reports occasional dark stools.  Prior GI workups did not reveal any colon or gastric abnormalities. Past Medical History  Diagnosis Date  . Heart murmur   . Anemia    Past Surgical History  Procedure Laterality Date  . Cesarean section    . Knee surgery    . Ceaserian    . Cesarean section    . Knee surgery     Family History  Problem Relation Age of Onset  . Hypertension Other   . Cancer Other    History  Substance Use Topics  . Smoking status: Never Smoker   . Smokeless tobacco: Never Used  . Alcohol Use: No    OB History   Grav Para Term Preterm Abortions TAB SAB Ect Mult Living                 Review of Systems  Constitutional: Positive for appetite change.  Eyes: Negative for photophobia.  Gastrointestinal: Positive for nausea and abdominal pain. Negative for vomiting.  Genitourinary: Positive for vaginal bleeding and menstrual problem.  Neurological: Positive for headaches.  All other systems reviewed and are negative.    Allergies  Penicillins  Home Medications   Current Outpatient Rx  Name  Route  Sig  Dispense  Refill  . aspirin-acetaminophen-caffeine (EXCEDRIN MIGRAINE) 250-250-65 MG per tablet   Oral   Take 2 tablets by mouth every 6 (six) hours as needed for headache.         . ibuprofen (ADVIL,MOTRIN) 200 MG tablet   Oral   Take 400 mg by mouth every 6 (six) hours as needed for headache or moderate pain.         Marland Kitchen ondansetron (ZOFRAN-ODT) 4 MG disintegrating tablet   Oral   Take 4 mg by mouth every 8 (eight) hours as needed for nausea or vomiting.         . pantoprazole (PROTONIX) 20 MG tablet   Oral   Take 1 tablet (20 mg total) by mouth daily.   30 tablet   1   . traMADol (ULTRAM) 50 MG tablet  Oral   Take 1 tablet (50 mg total) by mouth every 6 (six) hours as needed.   5 tablet   0    BP 99/61  Pulse 84  Temp(Src) 98.9 F (37.2 C)  Resp 16  SpO2 100%  LMP 06/25/2013 Physical Exam  Nursing note and vitals reviewed. Constitutional: She is oriented to person, place, and time. She appears well-developed and well-nourished. No distress.  HENT:  Head: Normocephalic.  Mouth/Throat: Oropharynx is clear and moist.  Eyes: EOM are normal. Pupils are equal, round, and reactive to light.  Neck: Normal range of motion.  Cardiovascular: Normal rate and regular rhythm.   Pulmonary/Chest: Effort normal and breath sounds normal.  Abdominal: Soft. Bowel sounds are normal. There is tenderness.    Musculoskeletal: She exhibits no edema and no  tenderness.  Lymphadenopathy:    She has no cervical adenopathy.  Neurological: She is alert and oriented to person, place, and time.  Skin: Skin is warm and dry.  Psychiatric: She has a normal mood and affect. Her behavior is normal. Judgment and thought content normal.    ED Course  Procedures (including critical care time) Labs Review Labs Reviewed  CBC WITH DIFFERENTIAL  URINALYSIS, ROUTINE W REFLEX MICROSCOPIC   Imaging Review No results found.  EKG Interpretation   None     Lab and radiology results reviewed and shared with patient. Slightly improved H/H.  Patient with history of chronic anemia, likely associated with heavy menstrual cycles.  Recommend patient follow-up with gynecology (referral provided).    Headache and abdominal pain improved after medications and IV fluids in ED.  Patient received prescriptions yesterday for analgesic and PPI--did not fill.  Patient encouraged to fill prescriptions and take as directed.  Return precautions discussed. MDM  Epigastric pain. Headache.    Jimmye Norman, NP 07/03/13 1758

## 2013-07-03 NOTE — ED Provider Notes (Signed)
Medical screening examination/treatment/procedure(s) were performed by non-physician practitioner and as supervising physician I was immediately available for consultation/collaboration.  EKG Interpretation   None        Lennox Dolberry R. Karne Ozga, MD 07/03/13 2323 

## 2013-07-27 ENCOUNTER — Emergency Department (HOSPITAL_COMMUNITY)
Admission: EM | Admit: 2013-07-27 | Discharge: 2013-07-27 | Disposition: A | Discharge status: Home / Self Care | Payer: Self-pay | Attending: Emergency Medicine | Admitting: Emergency Medicine

## 2013-07-27 ENCOUNTER — Emergency Department (HOSPITAL_COMMUNITY): Payer: Self-pay

## 2013-07-27 ENCOUNTER — Encounter (HOSPITAL_COMMUNITY): Payer: Self-pay | Admitting: Emergency Medicine

## 2013-07-27 DIAGNOSIS — R52 Pain, unspecified: Secondary | ICD-10-CM | POA: Insufficient documentation

## 2013-07-27 DIAGNOSIS — R011 Cardiac murmur, unspecified: Secondary | ICD-10-CM | POA: Insufficient documentation

## 2013-07-27 DIAGNOSIS — R509 Fever, unspecified: Secondary | ICD-10-CM

## 2013-07-27 DIAGNOSIS — R6889 Other general symptoms and signs: Secondary | ICD-10-CM

## 2013-07-27 DIAGNOSIS — Z862 Personal history of diseases of the blood and blood-forming organs and certain disorders involving the immune mechanism: Secondary | ICD-10-CM | POA: Insufficient documentation

## 2013-07-27 DIAGNOSIS — J111 Influenza due to unidentified influenza virus with other respiratory manifestations: Secondary | ICD-10-CM | POA: Insufficient documentation

## 2013-07-27 DIAGNOSIS — Z880 Allergy status to penicillin: Secondary | ICD-10-CM | POA: Insufficient documentation

## 2013-07-27 DIAGNOSIS — R000 Tachycardia, unspecified: Secondary | ICD-10-CM | POA: Insufficient documentation

## 2013-07-27 DIAGNOSIS — J40 Bronchitis, not specified as acute or chronic: Secondary | ICD-10-CM | POA: Insufficient documentation

## 2013-07-27 LAB — CBC WITH DIFFERENTIAL/PLATELET
Basophils Absolute: 0 10*3/uL (ref 0.0–0.1)
HCT: 23.5 % — ABNORMAL LOW (ref 36.0–46.0)
Lymphocytes Relative: 19 % (ref 12–46)
Monocytes Absolute: 0.3 10*3/uL (ref 0.1–1.0)
Neutro Abs: 2.3 10*3/uL (ref 1.7–7.7)
Platelets: 289 10*3/uL (ref 150–400)
RDW: 20.2 % — ABNORMAL HIGH (ref 11.5–15.5)
WBC: 3.3 10*3/uL — ABNORMAL LOW (ref 4.0–10.5)

## 2013-07-27 LAB — BASIC METABOLIC PANEL
BUN: 8 mg/dL (ref 6–23)
CO2: 24 mEq/L (ref 19–32)
Calcium: 8.8 mg/dL (ref 8.4–10.5)
Chloride: 104 mEq/L (ref 96–112)
Creatinine, Ser: 0.66 mg/dL (ref 0.50–1.10)
GFR calc Af Amer: >90 mL/min (ref >90)
GFR calc non Af Amer: >90 mL/min (ref >90)
Glucose, Bld: 92 mg/dL (ref 70–99)
Potassium: 3.4 mEq/L — ABNORMAL LOW (ref 3.5–5.1)
Sodium: 138 mEq/L (ref 135–145)

## 2013-07-27 MED ORDER — OSELTAMIVIR PHOSPHATE 75 MG PO CAPS: 75.0000 mg | ORAL_CAPSULE | Freq: Two times a day (BID) | ORAL | Status: DC

## 2013-07-27 MED ORDER — IPRATROPIUM BROMIDE 0.02 % IN SOLN
0.5000 mg | Freq: Once | RESPIRATORY_TRACT | Status: AC
  Administered 2013-07-27: 0.5 mg via RESPIRATORY_TRACT
  Filled 2013-07-27: qty 2.5

## 2013-07-27 MED ORDER — KETOROLAC TROMETHAMINE 30 MG/ML IJ SOLN
30.0000 mg | Freq: Once | INTRAMUSCULAR | Status: AC
  Administered 2013-07-27: 30 mg via INTRAVENOUS
  Filled 2013-07-27: qty 1

## 2013-07-27 MED ORDER — SODIUM CHLORIDE 0.9 % IN NEBU
INHALATION_SOLUTION | RESPIRATORY_TRACT | Status: AC
  Administered 2013-07-27: 13:00:00
  Filled 2013-07-27: qty 3

## 2013-07-27 MED ORDER — ALBUTEROL SULFATE HFA 108 (90 BASE) MCG/ACT IN AERS: 2.0000 | INHALATION_SPRAY | RESPIRATORY_TRACT | Status: DC | PRN

## 2013-07-27 MED ORDER — IBUPROFEN 600 MG PO TABS: 600.0000 mg | ORAL_TABLET | Freq: Four times a day (QID) | ORAL | Status: DC | PRN

## 2013-07-27 MED ORDER — ALBUTEROL SULFATE HFA 108 (90 BASE) MCG/ACT IN AERS
2.0000 | INHALATION_SPRAY | RESPIRATORY_TRACT | Status: DC | PRN
  Administered 2013-07-27: 2 via RESPIRATORY_TRACT
  Filled 2013-07-27: qty 6.7

## 2013-07-27 MED ORDER — ACETAMINOPHEN 325 MG PO TABS
650.0000 mg | ORAL_TABLET | Freq: Once | ORAL | Status: AC
  Administered 2013-07-27: 650 mg via ORAL
  Filled 2013-07-27: qty 2

## 2013-07-27 MED ORDER — SODIUM CHLORIDE 0.9 % IV BOLUS (SEPSIS)
1000.0000 mL | Freq: Once | INTRAVENOUS | Status: AC
  Administered 2013-07-27: 1000 mL via INTRAVENOUS

## 2013-07-27 MED ORDER — ALBUTEROL SULFATE (5 MG/ML) 0.5% IN NEBU
2.5000 mg | INHALATION_SOLUTION | Freq: Once | RESPIRATORY_TRACT | Status: AC
  Administered 2013-07-27: 2.5 mg via RESPIRATORY_TRACT
  Filled 2013-07-27: qty 0.5

## 2013-07-27 NOTE — Progress Notes (Signed)
[facility] provided pt with a list of primary care resources, [facility] [employer] Card application, and [facility] information.  °

## 2013-07-27 NOTE — ED Notes (Signed)
Pt comes in to c/o central chest pain, body aches, headache and shob that started this morning. Pt denies PMH asthma, copd.

## 2013-07-27 NOTE — ED Provider Notes (Signed)
CSN: [id number]     Arrival date & time 07/27/13  1232 History   First MD Initiated Contact with Patient 07/27/13 1243     Chief Complaint  Patient presents with  . Chest Pain  . Generalized Body Aches   (Consider location/radiation/quality/duration/timing/severity/associated sxs/prior Treatment) HPI  This is a 42 year old female with history of anemia who presents with shortness of breath, headache, bodyaches. Patient reports onset of symptoms today. She denies any fevers at home but was noted to have a temperature of 100.0 in triage. Patient reports shortness of breath and tightness in her chest. She has no history of asthma or COPD. She is a nonsmoker. She reports headache and generalized bodyaches.  She has had sick contacts at home.  Past Medical History  Diagnosis Date  . Heart murmur   . Anemia    Past Surgical History  Procedure Laterality Date  . Cesarean section    . Knee surgery    . Ceaserian    . Cesarean section    . Knee surgery     Family History  Problem Relation Age of Onset  . Hypertension Other   . Cancer Other    History  Substance Use Topics  . Smoking status: Never Smoker   . Smokeless tobacco: Never Used  . Alcohol Use: No   OB History   Grav Para Term Preterm Abortions TAB SAB Ect Mult Living                 Review of Systems  Constitutional: Positive for fever.  Respiratory: Positive for cough, chest tightness and shortness of breath.   Cardiovascular: Negative for chest pain.  Gastrointestinal: Negative for nausea, vomiting and abdominal pain.  Genitourinary: Negative for dysuria.  Musculoskeletal: Positive for myalgias. Negative for back pain.  Skin: Negative for wound.  Neurological: Positive for headaches.  Psychiatric/Behavioral: Negative for confusion.  All other systems reviewed and are negative.    Allergies  Penicillins  Home Medications   Current Outpatient Rx  Name  Route  Sig  Dispense  Refill  . ibuprofen  (ADVIL,MOTRIN) 200 MG tablet   Oral   Take 400 mg by mouth every 6 (six) hours as needed (pain).         Marland Kitchen albuterol (PROVENTIL HFA;VENTOLIN HFA) 108 (90 BASE) MCG/ACT inhaler   Inhalation   Inhale 2 puffs into the lungs every 4 (four) hours as needed for wheezing or shortness of breath.   1 Inhaler   0   . ibuprofen (ADVIL,MOTRIN) 600 MG tablet   Oral   Take 1 tablet (600 mg total) by mouth every 6 (six) hours as needed.   30 tablet   0    BP 124/66  Pulse 105  Temp(Src) 100 F (37.8 C) (Oral)  Resp 30  SpO2 100%  LMP 06/25/2013 Physical Exam  Nursing note and vitals reviewed. Constitutional: She is oriented to person, place, and time. No distress.  Ill-appearing but nontoxic  HENT:  Head: Normocephalic and atraumatic.  Mucous membranes dry  Eyes: Pupils are equal, round, and reactive to light.  Neck: Neck supple.  Cardiovascular: Regular rhythm and normal heart sounds.   Tachycardia  Pulmonary/Chest: Effort normal. No respiratory distress. She has wheezes.  Scant expiratory wheezing bilaterally  Abdominal: Soft. Bowel sounds are normal. There is no tenderness. There is no rebound.  Musculoskeletal: She exhibits no edema.  Neurological: She is alert and oriented to person, place, and time.  Skin: Skin is warm and dry. No  rash noted.  Psychiatric: She has a normal mood and affect.    ED Course  Procedures (including critical care time) Labs Review Labs Reviewed  CBC WITH DIFFERENTIAL - Abnormal; Notable for the following:    WBC 3.3 (*)    RBC 3.85 (*)    Hemoglobin 6.4 (*)    HCT 23.5 (*)    MCV 61.0 (*)    MCH 16.6 (*)    MCHC 27.2 (*)    RDW 20.2 (*)    Lymphs Abs 0.6 (*)    All other components within normal limits  BASIC METABOLIC PANEL - Abnormal; Notable for the following:    Potassium 3.4 (*)    All other components within normal limits   Imaging Review Dg Chest 2 View  07/27/2013   CLINICAL DATA:  Chest pain and shortness of breath  EXAM:  CHEST  2 VIEW  COMPARISON:  April 02, 2011  FINDINGS: The lungs are clear. The heart size and pulmonary vascularity are normal. No adenopathy. No pneumothorax. No bone lesions.  IMPRESSION: No abnormality noted.   Electronically Signed   By: Bretta Bang M.D.   On: 07/27/2013 14:09    EKG Interpretation    Date/Time:  Monday July 27 2013 12:37:00 EST Ventricular Rate:  102 PR Interval:  152 QRS Duration: 84 QT Interval:  338 QTC Calculation: 440 R Axis:   16 Text Interpretation:  Sinus tachycardia Poor R wave progression No significant change since last tracing Confirmed by Eliberto Sole  MD, Jossalin Chervenak ([id number]) on 07/27/2013 3:45:37 PM            MDM   1. Flu-like symptoms   2. Bronchitis   3. Fever   4.  Anemia  This a 42 year old female who presents with flulike symptoms. She is nontoxic-appearing on exam. Vital signs are notable for temperature 100, pulse 105. Exam is notable for scant expiratory wheezing.  Patient was given a normal saline bolus and Toradol. Lab work is notable for a hemoglobin of 6.4. She has a known history of anemia and is supposed to take iron.  Chest x-ray is negative. EKG shows sinus tachycardia.  Patient reports that the albuterol helped in her pulmonary exam has cleared. Patient will be ordered a albuterol inhaler. Of note, patient's temperature increased to 102, she was given Tylenol. Given the low sensitivity of the flu screen and the patient's absence of high risk factors, will not screen for the flu at this time.  I will offer the patient Tamiflu.  Patient was encouraged to stay hydrated at home and use albuterol for cough and wheezing. She is to return if any worsening of symptoms.  After history, exam, and medical workup I feel the patient has been appropriately medically screened and is safe for discharge home. Pertinent diagnoses were discussed with the patient. Patient was given return precautions.     Shon Baton, MD 07/27/13 [phone]

## 2013-07-27 NOTE — ED Notes (Addendum)
Critical lab HGB 6.4. Made [facility] aware. No new orders at this time.

## 2013-07-27 NOTE — ED Notes (Signed)
Oral temp 102.7, made [facility] aware. New orders given

## 2013-10-04 ENCOUNTER — Emergency Department (HOSPITAL_BASED_OUTPATIENT_CLINIC_OR_DEPARTMENT_OTHER)
Admission: EM | Admit: 2013-10-04 | Discharge: 2013-10-04 | Disposition: A | Discharge status: Home / Self Care | Payer: No Typology Code available for payment source | Attending: Emergency Medicine | Admitting: Emergency Medicine

## 2013-10-04 ENCOUNTER — Encounter (HOSPITAL_BASED_OUTPATIENT_CLINIC_OR_DEPARTMENT_OTHER): Payer: Self-pay | Admitting: Emergency Medicine

## 2013-10-04 DIAGNOSIS — R5383 Other fatigue: Secondary | ICD-10-CM

## 2013-10-04 DIAGNOSIS — Z880 Allergy status to penicillin: Secondary | ICD-10-CM | POA: Insufficient documentation

## 2013-10-04 DIAGNOSIS — R110 Nausea: Secondary | ICD-10-CM | POA: Insufficient documentation

## 2013-10-04 DIAGNOSIS — Z87440 Personal history of urinary (tract) infections: Secondary | ICD-10-CM | POA: Insufficient documentation

## 2013-10-04 DIAGNOSIS — R6883 Chills (without fever): Secondary | ICD-10-CM | POA: Insufficient documentation

## 2013-10-04 DIAGNOSIS — Z8742 Personal history of other diseases of the female genital tract: Secondary | ICD-10-CM | POA: Insufficient documentation

## 2013-10-04 DIAGNOSIS — R1012 Left upper quadrant pain: Secondary | ICD-10-CM | POA: Insufficient documentation

## 2013-10-04 DIAGNOSIS — Z862 Personal history of diseases of the blood and blood-forming organs and certain disorders involving the immune mechanism: Secondary | ICD-10-CM | POA: Insufficient documentation

## 2013-10-04 DIAGNOSIS — K6289 Other specified diseases of anus and rectum: Secondary | ICD-10-CM | POA: Insufficient documentation

## 2013-10-04 DIAGNOSIS — R5381 Other malaise: Secondary | ICD-10-CM | POA: Insufficient documentation

## 2013-10-04 DIAGNOSIS — R1032 Left lower quadrant pain: Secondary | ICD-10-CM | POA: Insufficient documentation

## 2013-10-04 DIAGNOSIS — R197 Diarrhea, unspecified: Secondary | ICD-10-CM | POA: Insufficient documentation

## 2013-10-04 DIAGNOSIS — R011 Cardiac murmur, unspecified: Secondary | ICD-10-CM | POA: Insufficient documentation

## 2013-10-04 HISTORY — DX: Urinary tract infection, site not specified: N39.0

## 2013-10-04 HISTORY — DX: Benign neoplasm of connective and other soft tissue, unspecified: D21.9

## 2013-10-04 LAB — COMPREHENSIVE METABOLIC PANEL
ALT: 15 U/L (ref 0–35)
AST: 20 U/L (ref 0–37)
Albumin: 3.6 g/dL (ref 3.5–5.2)
Alkaline Phosphatase: 77 U/L (ref 39–117)
BILIRUBIN TOTAL: 0.2 mg/dL — AB (ref 0.3–1.2)
BUN: 11 mg/dL (ref 6–23)
CHLORIDE: 103 meq/L (ref 96–112)
CO2: 26 meq/L (ref 19–32)
CREATININE: 0.6 mg/dL (ref 0.50–1.10)
Calcium: 9.4 mg/dL (ref 8.4–10.5)
GFR calc Af Amer: >90 mL/min (ref >90)
Glucose, Bld: 93 mg/dL (ref 70–99)
POTASSIUM: 3.6 meq/L — AB (ref 3.7–5.3)
Sodium: 140 mEq/L (ref 137–147)
Total Protein: 7.7 g/dL (ref 6.0–8.3)

## 2013-10-04 LAB — CBC WITH DIFFERENTIAL/PLATELET
BASOS PCT: 1 % (ref 0–1)
Basophils Absolute: 0 10*3/uL (ref 0.0–0.1)
EOS PCT: 2 % (ref 0–5)
Eosinophils Absolute: 0.1 10*3/uL (ref 0.0–0.7)
HEMATOCRIT: 24.8 % — AB (ref 36.0–46.0)
HEMOGLOBIN: 6.7 g/dL — AB (ref 12.0–15.0)
LYMPHS ABS: 2.2 10*3/uL (ref 0.7–4.0)
Lymphocytes Relative: 54 % — ABNORMAL HIGH (ref 12–46)
MCH: 16.4 pg — AB (ref 26.0–34.0)
MCHC: 27 g/dL — ABNORMAL LOW (ref 30.0–36.0)
MCV: 60.8 fL — AB (ref 78.0–100.0)
MONOS PCT: 12 % (ref 3–12)
Monocytes Absolute: 0.5 10*3/uL (ref 0.1–1.0)
Neutro Abs: 1.3 10*3/uL — ABNORMAL LOW (ref 1.7–7.7)
Neutrophils Relative %: 31 % — ABNORMAL LOW (ref 43–77)
Platelets: 220 10*3/uL (ref 150–400)
RBC: 4.08 MIL/uL (ref 3.87–5.11)
RDW: 19.8 % — ABNORMAL HIGH (ref 11.5–15.5)
WBC: 4.1 10*3/uL (ref 4.0–10.5)

## 2013-10-04 LAB — URINALYSIS, ROUTINE W REFLEX MICROSCOPIC
Bilirubin Urine: NEGATIVE
GLUCOSE, UA: NEGATIVE mg/dL
HGB URINE DIPSTICK: NEGATIVE
KETONES UR: NEGATIVE mg/dL
Leukocytes, UA: NEGATIVE
Nitrite: NEGATIVE
PROTEIN: NEGATIVE mg/dL
Specific Gravity, Urine: 1.022 (ref 1.005–1.030)
Urobilinogen, UA: 1 mg/dL (ref 0.0–1.0)
pH: 6.5 (ref 5.0–8.0)

## 2013-10-04 LAB — OCCULT BLOOD X 1 CARD TO LAB, STOOL: Fecal Occult Bld: NEGATIVE

## 2013-10-04 MED ORDER — TUCKS 50 % EX PADS: 1.0000 "application " | MEDICATED_PAD | Freq: Three times a day (TID) | CUTANEOUS | Status: DC

## 2013-10-04 MED ORDER — FERROUS SULFATE 325 (65 FE) MG PO TABS: 325.0000 mg | ORAL_TABLET | Freq: Every day | ORAL | Status: DC

## 2013-10-04 MED ORDER — ONDANSETRON HCL 4 MG PO TABS: 4.0000 mg | ORAL_TABLET | Freq: Four times a day (QID) | ORAL | Status: DC

## 2013-10-04 MED ORDER — SODIUM CHLORIDE 0.9 % IV SOLN
INTRAVENOUS | Status: DC
  Administered 2013-10-04: 17:00:00 via INTRAVENOUS

## 2013-10-04 NOTE — ED Notes (Addendum)
Patient states that she is having diarrhea for appx a week, now that has resolved, but she feel like she needs to have a BM and cannot. Last LBM this AM. Abd pain in central abd and nausea. States that her rectum is painful and that she has had a few BMs with small amount of bright red blood.

## 2013-10-04 NOTE — ED Provider Notes (Signed)
CSN: [id number]     Arrival date & time 10/04/13  1412 History   First MD Initiated Contact with Patient 10/04/13 1552     Chief Complaint  Patient presents with  . Diarrhea     (Consider location/radiation/quality/duration/timing/severity/associated sxs/prior Treatment) Patient is a 43 y.o. female presenting with diarrhea. The history is provided by the patient.  Diarrhea Quality:  Watery Severity:  Severe Onset quality:  Gradual Number of episodes:  10 times a day until 2 days ago  Duration:  6 days Progression:  Improving Relieved by:  Liquids Associated symptoms: abdominal pain, chills and myalgias   Associated symptoms: no recent cough, no fever and no vomiting   Risk factors: no recent antibiotic use, no sick contacts, no suspicious food intake and no travel to endemic areas    Karen Lutz is a 43 y.o. female who presents to the ED with diarrhea that started 6 days ago. The past 2 days she has not had diarrhea but has anal irritation and pain from so much diarrhea. Patient states she at a hamburger on the way here today.  PMH significant for severe anemia.  Patient does not have a PCP.   Past Medical History  Diagnosis Date  . Heart murmur   . Anemia   . UTI (lower urinary tract infection)   . Fibroids    Past Surgical History  Procedure Laterality Date  . Cesarean section    . Knee surgery    . Ceaserian    . Cesarean section    . Knee surgery     Family History  Problem Relation Age of Onset  . Hypertension Other   . Cancer Other    History  Substance Use Topics  . Smoking status: Never Smoker   . Smokeless tobacco: Never Used  . Alcohol Use: No   OB History   Grav Para Term Preterm Abortions TAB SAB Ect Mult Living                 Review of Systems  Constitutional: Positive for chills. Negative for fever.  HENT: Negative.   Eyes: Negative for visual disturbance.  Respiratory: Negative for cough and shortness of breath.   Cardiovascular:  Negative for chest pain.  Gastrointestinal: Positive for nausea, abdominal pain and diarrhea. Negative for vomiting.  Genitourinary: Negative for dysuria, urgency, frequency, vaginal bleeding and vaginal discharge.  Musculoskeletal: Positive for myalgias. Negative for back pain.  Skin: Negative for rash.  Neurological: Negative for syncope.  Psychiatric/Behavioral: Negative for confusion. The patient is not nervous/anxious.       Allergies  Penicillins  Home Medications   Current Outpatient Rx  Name  Route  Sig  Dispense  Refill  . albuterol (PROVENTIL HFA;VENTOLIN HFA) 108 (90 BASE) MCG/ACT inhaler   Inhalation   Inhale 2 puffs into the lungs every 4 (four) hours as needed for wheezing or shortness of breath.   1 Inhaler   0   . ibuprofen (ADVIL,MOTRIN) 200 MG tablet   Oral   Take 400 mg by mouth every 6 (six) hours as needed (pain).         Marland Kitchen ibuprofen (ADVIL,MOTRIN) 600 MG tablet   Oral   Take 1 tablet (600 mg total) by mouth every 6 (six) hours as needed.   30 tablet   0   . oseltamivir (TAMIFLU) 75 MG capsule   Oral   Take 1 capsule (75 mg total) by mouth every 12 (twelve) hours.   10 capsule  0    BP 132/65  Pulse 99  Temp(Src) 98.3 F (36.8 C) (Oral)  Resp 18  Ht 5\' 6"  (1.676 m)  Wt 245 lb (111.131 kg)  BMI 39.56 kg/m2  SpO2 99% Physical Exam  Nursing note and vitals reviewed. Constitutional: She is oriented to person, place, and time. She appears well-developed and well-nourished.  HENT:  Head: Normocephalic and atraumatic.  Eyes: Conjunctivae and EOM are normal.  Neck: Neck supple.  Cardiovascular: Normal rate and regular rhythm.   Pulmonary/Chest: Effort normal and breath sounds normal.  Abdominal: Soft. Bowel sounds are normal. There is tenderness in the suprapubic area, left upper quadrant and left lower quadrant. There is no rebound, no guarding and no CVA tenderness.  Genitourinary: Rectal exam shows tenderness. Rectal exam shows no  external hemorrhoid, no internal hemorrhoid, no fissure and anal tone normal.  Musculoskeletal: Normal range of motion.  Neurological: She is alert and oriented to person, place, and time. No cranial nerve deficit.  Skin: Skin is warm and dry.  Psychiatric: She has a normal mood and affect. Her behavior is normal.   Results for orders placed during the hospital encounter of 10/04/13 (from the past 24 hour(s))  CBC WITH DIFFERENTIAL     Status: Abnormal   Collection Time    10/04/13  4:20 PM      Result Value Ref Range   WBC 4.1  4.0 - 10.5 K/uL   RBC 4.08  3.87 - 5.11 MIL/uL   Hemoglobin 6.7 (*) 12.0 - 15.0 g/dL   HCT 24.8 (*) 36.0 - 46.0 %   MCV 60.8 (*) 78.0 - 100.0 fL   MCH 16.4 (*) 26.0 - 34.0 pg   MCHC 27.0 (*) 30.0 - 36.0 g/dL   RDW 19.8 (*) 11.5 - 15.5 %   Platelets 220  150 - 400 K/uL   Neutrophils Relative % 31 (*) 43 - 77 %   Lymphocytes Relative 54 (*) 12 - 46 %   Monocytes Relative 12  3 - 12 %   Eosinophils Relative 2  0 - 5 %   Basophils Relative 1  0 - 1 %   Neutro Abs 1.3 (*) 1.7 - 7.7 K/uL   Lymphs Abs 2.2  0.7 - 4.0 K/uL   Monocytes Absolute 0.5  0.1 - 1.0 K/uL   Eosinophils Absolute 0.1  0.0 - 0.7 K/uL   Basophils Absolute 0.0  0.0 - 0.1 K/uL   RBC Morphology POLYCHROMASIA PRESENT     Smear Review LARGE PLATELETS PRESENT    COMPREHENSIVE METABOLIC PANEL     Status: Abnormal   Collection Time    10/04/13  4:20 PM      Result Value Ref Range   Sodium 140  137 - 147 mEq/L   Potassium 3.6 (*) 3.7 - 5.3 mEq/L   Chloride 103  96 - 112 mEq/L   CO2 26  19 - 32 mEq/L   Glucose, Bld 93  70 - 99 mg/dL   BUN 11  6 - 23 mg/dL   Creatinine, Ser 0.60  0.50 - 1.10 mg/dL   Calcium 9.4  8.4 - 10.5 mg/dL   Total Protein 7.7  6.0 - 8.3 g/dL   Albumin 3.6  3.5 - 5.2 g/dL   AST 20  0 - 37 U/L   ALT 15  0 - 35 U/L   Alkaline Phosphatase 77  39 - 117 U/L   Total Bilirubin 0.2 (*) 0.3 - 1.2 mg/dL   GFR calc  non Af Amer >90  >90 mL/min   GFR calc Af Amer >90  >90 mL/min   URINALYSIS, ROUTINE W REFLEX MICROSCOPIC     Status: None   Collection Time    10/04/13  4:31 PM      Result Value Ref Range   Color, Urine YELLOW  YELLOW   APPearance CLEAR  CLEAR   Specific Gravity, Urine 1.022  1.005 - 1.030   pH 6.5  5.0 - 8.0   Glucose, UA NEGATIVE  NEGATIVE mg/dL   Hgb urine dipstick NEGATIVE  NEGATIVE   Bilirubin Urine NEGATIVE  NEGATIVE   Ketones, ur NEGATIVE  NEGATIVE mg/dL   Protein, ur NEGATIVE  NEGATIVE mg/dL   Urobilinogen, UA 1.0  0.0 - 1.0 mg/dL   Nitrite NEGATIVE  NEGATIVE   Leukocytes, UA NEGATIVE  NEGATIVE    ED Course: discussed with Dr. Darl Householder  Procedures   MDM  43 y.o. female with diarrhea that has resolved, nausea and anal irritation. She ate hamburger on the way to the ED. She denies any abdominal pain or nausea now she is only concerned about the irritation around her anus from so much diarrhea. She has chronic anemia and is not being treated because she does not have a PCP. Will treat her anal irritation and start her on Fe. Referral given for PCP. Discussed with the patient and all questioned fully answered. She will return if any problems arise.     Medication List    TAKE these medications       ferrous sulfate 325 (65 FE) MG tablet  Take 1 tablet (325 mg total) by mouth daily.     ondansetron 4 MG tablet  Commonly known as:  ZOFRAN  Take 1 tablet (4 mg total) by mouth every 6 (six) hours.     TUCKS 50 % Pads  Apply 1 application topically 3 (three) times daily.      ASK your doctor about these medications       albuterol 108 (90 BASE) MCG/ACT inhaler  Commonly known as:  PROVENTIL HFA;VENTOLIN HFA  Inhale 2 puffs into the lungs every 4 (four) hours as needed for wheezing or shortness of breath.     ibuprofen 600 MG tablet  Commonly known as:  ADVIL,MOTRIN  Take 1 tablet (600 mg total) by mouth every 6 (six) hours as needed.     ibuprofen 200 MG tablet  Commonly known as:  ADVIL,MOTRIN  Take 400 mg by mouth every 6 (six)  hours as needed (pain).     oseltamivir 75 MG capsule  Commonly known as:  TAMIFLU  Take 1 capsule (75 mg total) by mouth every 12 (twelve) hours.           Ashley Mur[place], [place] 10/04/13 2308

## 2013-10-04 NOTE — ED Provider Notes (Signed)
Medical screening examination/treatment/procedure(s) were performed by non-physician practitioner and as supervising physician I was immediately available for consultation/collaboration.   EKG Interpretation None        Wandra Arthurs, MD 10/04/13 2310

## 2013-10-04 NOTE — Discharge Instructions (Signed)
It is important that you follow up with a primary care doctor to address your anemia and to take care of your medical problems. Call Dr. Charlett Blake for a follow up appointment. Return here as needed.

## 2014-02-15 ENCOUNTER — Encounter (HOSPITAL_COMMUNITY): Payer: Self-pay | Admitting: Emergency Medicine

## 2014-02-15 ENCOUNTER — Emergency Department (HOSPITAL_COMMUNITY)
Admission: EM | Admit: 2014-02-15 | Discharge: 2014-02-15 | Disposition: A | Discharge status: Home / Self Care | Payer: No Typology Code available for payment source | Attending: Emergency Medicine | Admitting: Emergency Medicine

## 2014-02-15 DIAGNOSIS — M25473 Effusion, unspecified ankle: Secondary | ICD-10-CM | POA: Insufficient documentation

## 2014-02-15 DIAGNOSIS — M25476 Effusion, unspecified foot: Principal | ICD-10-CM | POA: Insufficient documentation

## 2014-02-15 DIAGNOSIS — Z87440 Personal history of urinary (tract) infections: Secondary | ICD-10-CM | POA: Insufficient documentation

## 2014-02-15 DIAGNOSIS — R2243 Localized swelling, mass and lump, lower limb, bilateral: Secondary | ICD-10-CM

## 2014-02-15 DIAGNOSIS — Y939 Activity, unspecified: Secondary | ICD-10-CM | POA: Insufficient documentation

## 2014-02-15 DIAGNOSIS — M79609 Pain in unspecified limb: Secondary | ICD-10-CM | POA: Insufficient documentation

## 2014-02-15 DIAGNOSIS — Z8719 Personal history of other diseases of the digestive system: Secondary | ICD-10-CM | POA: Insufficient documentation

## 2014-02-15 DIAGNOSIS — Y929 Unspecified place or not applicable: Secondary | ICD-10-CM | POA: Insufficient documentation

## 2014-02-15 DIAGNOSIS — Z880 Allergy status to penicillin: Secondary | ICD-10-CM | POA: Insufficient documentation

## 2014-02-15 DIAGNOSIS — Z8742 Personal history of other diseases of the female genital tract: Secondary | ICD-10-CM | POA: Insufficient documentation

## 2014-02-15 DIAGNOSIS — Z79899 Other long term (current) drug therapy: Secondary | ICD-10-CM | POA: Insufficient documentation

## 2014-02-15 DIAGNOSIS — D649 Anemia, unspecified: Secondary | ICD-10-CM | POA: Insufficient documentation

## 2014-02-15 DIAGNOSIS — R011 Cardiac murmur, unspecified: Secondary | ICD-10-CM | POA: Insufficient documentation

## 2014-02-15 DIAGNOSIS — IMO0002 Reserved for concepts with insufficient information to code with codable children: Secondary | ICD-10-CM | POA: Insufficient documentation

## 2014-02-15 HISTORY — DX: Gastro-esophageal reflux disease without esophagitis: K21.9

## 2014-02-15 NOTE — ED Provider Notes (Signed)
CSN: [id number]     Arrival date & time 02/15/14  1431 History  This chart was scribed for non-physician practitioner, Montine Circle, PA-C working with Varney Biles, MD by Frederich Balding, ED scribe. This patient was seen in room TR08C/TR08C and the patient's care was started at 4:12 PM.   Chief Complaint  Patient presents with  . Joint Swelling   The history is provided by the patient. No language interpreter was used.   HPI Comments: Karen Lutz is a 43 y.o. female who presents to the Emergency Department complaining of gradual onset bilateral ankle swelling that started 1.5 months ago. Reports mild pain in her calves. Denies specific injury but states she is on her feet a lot at one of her jobs. Pt has not taken any medications for her symptoms. She is also complaining of and abrasion to right elbow after she hit it several weeks ago. Denies history of blood clots.   Past Medical History  Diagnosis Date  . Heart murmur   . Anemia   . UTI (lower urinary tract infection)   . Fibroids   . Acid reflux    Past Surgical History  Procedure Laterality Date  . Cesarean section    . Knee surgery    . Ceaserian    . Cesarean section    . Knee surgery     Family History  Problem Relation Age of Onset  . Hypertension Other   . Cancer Other    History  Substance Use Topics  . Smoking status: Never Smoker   . Smokeless tobacco: Never Used  . Alcohol Use: No   OB History   Grav Para Term Preterm Abortions TAB SAB Ect Mult Living                 Review of Systems  Constitutional: Negative for fever.  HENT: Negative for congestion.   Eyes: Negative for redness.  Respiratory: Negative for shortness of breath.   Cardiovascular: Negative for chest pain.  Gastrointestinal: Negative for abdominal distention.  Musculoskeletal: Positive for arthralgias and joint swelling.  Skin: Negative for rash.  Neurological: Negative for speech difficulty.  Psychiatric/Behavioral:  Negative for confusion.   Allergies  Penicillins  Home Medications   Prior to Admission medications   Medication Sig Start Date End Date Taking? Authorizing Provider  albuterol (PROVENTIL HFA;VENTOLIN HFA) 108 (90 BASE) MCG/ACT inhaler Inhale 2 puffs into the lungs every 4 (four) hours as needed for wheezing or shortness of breath. 07/27/13   Merryl Hacker, MD  ferrous sulfate 325 (65 FE) MG tablet Take 1 tablet (325 mg total) by mouth daily. 10/04/13   Hope Bunnie Pion, NP  ibuprofen (ADVIL,MOTRIN) 200 MG tablet Take 400 mg by mouth every 6 (six) hours as needed (pain).    Historical Provider, MD  ibuprofen (ADVIL,MOTRIN) 600 MG tablet Take 1 tablet (600 mg total) by mouth every 6 (six) hours as needed. 07/27/13   Merryl Hacker, MD  ondansetron (ZOFRAN) 4 MG tablet Take 1 tablet (4 mg total) by mouth every 6 (six) hours. 10/04/13   [place], NP  oseltamivir (TAMIFLU) 75 MG capsule Take 1 capsule (75 mg total) by mouth every 12 (twelve) hours. 07/27/13   Merryl Hacker, MD  Witch Hazel (TUCKS) 50 % PADS Apply 1 application topically 3 (three) times daily. 10/04/13   Hope Bunnie Pion, NP   BP 134/64  Pulse 81  Temp(Src) 98.9 F (37.2 C) (Oral)  Resp 16  Wt  262 lb (118.842 kg)  SpO2 98%  Physical Exam  Nursing note and vitals reviewed. Constitutional: She is oriented to person, place, and time. She appears well-developed and well-nourished. No distress.  HENT:  Head: Normocephalic and atraumatic.  Eyes: Conjunctivae and EOM are normal.  Cardiovascular: Normal rate, regular rhythm and intact distal pulses.   Brisk capillary refill.   Pulmonary/Chest: Effort normal and breath sounds normal. No stridor. No respiratory distress. She has no wheezes. She has no rhonchi. She has no rales.  Abdominal: She exhibits no distension.  Musculoskeletal: She exhibits no edema.  Bilateral lower extremities remarkable for very mild edema. Non tender to palpation. No bony abnormality or deformity.  ROM and strength 5/5. No evidence of DVT or septic joint bilaterally.   Neurological: She is alert and oriented to person, place, and time. No cranial nerve deficit.  Skin: Skin is warm and dry.  No evidence of cellulitis.   Mild skin abrasion to right elbow  Psychiatric: She has a normal mood and affect.    ED Course  Procedures (including critical care time)  DIAGNOSTIC STUDIES: Oxygen Saturation is 98% on RA, normal by my interpretation.    COORDINATION OF CARE: 4:16 PM-Discussed treatment plan which includes compression stockings with pt at bedside and pt agreed to plan. Will give pt PCP referrals and advised her to follow up.   Labs Review Labs Reviewed - No data to display  Imaging Review No results found.   EKG Interpretation None      MDM   Final diagnoses:  Localized swelling of both lower legs    Mild peripheral edema bilaterally.  No sign of DVT or septic joint.  Recommend TED hose and PCP follow-up.  I personally performed the services described in this documentation, which was scribed in my presence. The recorded information has been reviewed and is accurate.    Montine Circle, PA-C 02/15/14 [phone]

## 2014-02-15 NOTE — Discharge Instructions (Signed)
Please try TED hose.  You need to establish care with a primary care physician.  Peripheral Edema You have swelling in your legs (peripheral edema). This swelling is due to excess accumulation of salt and water in your body. Edema may be a sign of heart, kidney or liver disease, or a side effect of a medication. It may also be due to problems in the leg veins. Elevating your legs and using special support stockings may be very helpful, if the cause of the swelling is due to poor venous circulation. Avoid long periods of standing, whatever the cause. Treatment of edema depends on identifying the cause. Chips, pretzels, pickles and other salty foods should be avoided. Restricting salt in your diet is almost always needed. Water pills (diuretics) are often used to remove the excess salt and water from your body via urine. These medicines prevent the kidney from reabsorbing sodium. This increases urine flow. Diuretic treatment may also result in lowering of potassium levels in your body. Potassium supplements may be needed if you have to use diuretics daily. Daily weights can help you keep track of your progress in clearing your edema. You should call your caregiver for follow up care as recommended. SEEK IMMEDIATE MEDICAL CARE IF:   You have increased swelling, pain, redness, or heat in your legs.  You develop shortness of breath, especially when lying down.  You develop chest or abdominal pain, weakness, or fainting.  You have a fever. Document Released: 08/30/2004 Document Revised: 10/15/2011 Document Reviewed: 08/10/2009 [facility] Patient Information 2015 [place], [place]. This information is not intended to replace advice given to you by your health care provider. Make sure you discuss any questions you have with your health care provider.

## 2014-02-15 NOTE — ED Notes (Addendum)
Ankle swelling  1 1/2 months has not seen a dr small amt pain  No injury  States she also has a spot on her rt elbow that she  Hit and it still is sore that she wants looked at also

## 2014-02-17 NOTE — ED Provider Notes (Signed)
Medical screening examination/treatment/procedure(s) were performed by non-physician practitioner and as supervising physician I was immediately available for consultation/collaboration.   EKG Interpretation None       Varney Biles, MD 02/17/14 1210

## 2014-06-28 ENCOUNTER — Encounter (HOSPITAL_BASED_OUTPATIENT_CLINIC_OR_DEPARTMENT_OTHER): Payer: Self-pay | Admitting: *Deleted

## 2014-06-28 ENCOUNTER — Emergency Department (HOSPITAL_BASED_OUTPATIENT_CLINIC_OR_DEPARTMENT_OTHER)
Admission: EM | Admit: 2014-06-28 | Discharge: 2014-06-28 | Disposition: A | Discharge status: Home / Self Care | Payer: No Typology Code available for payment source | Attending: Emergency Medicine | Admitting: Emergency Medicine

## 2014-06-28 DIAGNOSIS — Z862 Personal history of diseases of the blood and blood-forming organs and certain disorders involving the immune mechanism: Secondary | ICD-10-CM | POA: Insufficient documentation

## 2014-06-28 DIAGNOSIS — Z79899 Other long term (current) drug therapy: Secondary | ICD-10-CM | POA: Insufficient documentation

## 2014-06-28 DIAGNOSIS — Z880 Allergy status to penicillin: Secondary | ICD-10-CM | POA: Insufficient documentation

## 2014-06-28 DIAGNOSIS — N390 Urinary tract infection, site not specified: Secondary | ICD-10-CM | POA: Insufficient documentation

## 2014-06-28 DIAGNOSIS — Z8719 Personal history of other diseases of the digestive system: Secondary | ICD-10-CM | POA: Insufficient documentation

## 2014-06-28 DIAGNOSIS — Z3202 Encounter for pregnancy test, result negative: Secondary | ICD-10-CM | POA: Insufficient documentation

## 2014-06-28 DIAGNOSIS — Z8742 Personal history of other diseases of the female genital tract: Secondary | ICD-10-CM | POA: Insufficient documentation

## 2014-06-28 DIAGNOSIS — R011 Cardiac murmur, unspecified: Secondary | ICD-10-CM | POA: Insufficient documentation

## 2014-06-28 LAB — URINE MICROSCOPIC-ADD ON

## 2014-06-28 LAB — URINALYSIS, ROUTINE W REFLEX MICROSCOPIC
GLUCOSE, UA: NEGATIVE mg/dL
KETONES UR: 40 mg/dL — AB
Nitrite: POSITIVE — AB
PH: 5 (ref 5.0–8.0)
Protein, ur: 100 mg/dL — AB
SPECIFIC GRAVITY, URINE: 1.018 (ref 1.005–1.030)
Urobilinogen, UA: >8 mg/dL — ABNORMAL HIGH (ref 0.0–1.0)

## 2014-06-28 LAB — PREGNANCY, URINE: Preg Test, Ur: NEGATIVE

## 2014-06-28 MED ORDER — ONDANSETRON 8 MG PO TBDP
8.0000 mg | ORAL_TABLET | Freq: Once | ORAL | Status: AC
  Administered 2014-06-28: 8 mg via ORAL
  Filled 2014-06-28: qty 1

## 2014-06-28 MED ORDER — CIPROFLOXACIN HCL 500 MG PO TABS: 500.0000 mg | ORAL_TABLET | Freq: Two times a day (BID) | ORAL | Status: DC

## 2014-06-28 NOTE — ED Notes (Signed)
Possible UTI. Dysuria today.

## 2014-06-28 NOTE — Discharge Instructions (Signed)
Please follow up with your primary care physician in 1-2 days. If you do not have one please call the [facility] number listed above.Please take your antibiotic until completion. Please read all discharge instructions and return precautions.  ° °Urinary Tract Infection °Urinary tract infections (UTIs) can develop anywhere along your urinary tract. Your urinary tract is your body's drainage system for removing wastes and extra water. Your urinary tract includes two kidneys, two ureters, a bladder, and a urethra. Your kidneys are a pair of bean-shaped organs. Each kidney is about the size of your fist. They are located below your ribs, one on each side of your spine. °CAUSES °Infections are caused by microbes, which are microscopic organisms, including fungi, viruses, and bacteria. These organisms are so small that they can only be seen through a microscope. Bacteria are the microbes that most commonly cause UTIs. °SYMPTOMS  °Symptoms of UTIs may vary by age and gender of the patient and by the location of the infection. Symptoms in young women typically include a frequent and intense urge to urinate and a painful, burning feeling in the bladder or urethra during urination. Older women and men are more likely to be tired, shaky, and weak and have muscle aches and abdominal pain. A fever may mean the infection is in your kidneys. Other symptoms of a kidney infection include pain in your back or sides below the ribs, nausea, and vomiting. °DIAGNOSIS °To diagnose a UTI, your caregiver will ask you about your symptoms. Your caregiver also will ask to provide a urine sample. The urine sample will be tested for bacteria and white blood cells. White blood cells are made by your body to help fight infection. °TREATMENT  °Typically, UTIs can be treated with medication. Because most UTIs are caused by a bacterial infection, they usually can be treated with the use of antibiotics. The choice of antibiotic  and length of treatment depend on your symptoms and the type of bacteria causing your infection. °HOME CARE INSTRUCTIONS °· If you were prescribed antibiotics, take them exactly as your caregiver instructs you. Finish the medication even if you feel better after you have only taken some of the medication. °· Drink enough water and fluids to keep your urine clear or pale yellow. °· Avoid caffeine, tea, and carbonated beverages. They tend to irritate your bladder. °· Empty your bladder often. Avoid holding urine for long periods of time. °· Empty your bladder before and after sexual intercourse. °· After a bowel movement, women should cleanse from front to back. Use each tissue only once. °SEEK MEDICAL CARE IF:  °· You have back pain. °· You develop a fever. °· Your symptoms do not begin to resolve within 3 days. °SEEK IMMEDIATE MEDICAL CARE IF:  °· You have severe back pain or lower abdominal pain. °· You develop chills. °· You have nausea or vomiting. °· You have continued burning or discomfort with urination. °MAKE SURE YOU:  °· Understand these instructions. °· Will watch your condition. °· Will get help right away if you are not doing well or get worse. °Document Released: 05/02/2005 Document Revised: 01/22/2012 Document Reviewed: 08/31/2011 °[employer]® Patient Information ©2015 [employer]. This information is not intended to replace advice given to you by your health care provider. Make sure you discuss any questions you have with your health care provider. ° °

## 2014-06-28 NOTE — ED Provider Notes (Signed)
CSN: [id number]     Arrival date & time 06/28/14  2054 History   First MD Initiated Contact with Patient 06/28/14 2108     Chief Complaint  Patient presents with  . Urinary Tract Infection     (Consider location/radiation/quality/duration/timing/severity/associated sxs/prior Treatment) HPI Comments: Patient is a G60 P65 43 year old female past medical history significant for recurrent UTIs presented to the emergency department for one day of dysuria with associated urinary frequency and urgency. Endorses mild suprapubic abdominal discomfort with low back pain. No alleviating or aggravating factors. Patient has tried taking Azo over-the-counter for her symptoms with little no improvement. Denies any fevers, chills, vomiting, vaginal bleeding or discharge, diarrhea, constipation. Patient denies any concern for sexual transmitted diseases. Last menstrual period was last week. Abdominal surgical history includes cesarean sections.  Patient is a 43 y.o. female presenting with urinary tract infection.  Urinary Tract Infection Associated symptoms include abdominal pain and nausea. Pertinent negatives include no vomiting.    Past Medical History  Diagnosis Date  . Heart murmur   . Anemia   . UTI (lower urinary tract infection)   . Fibroids   . Acid reflux    Past Surgical History  Procedure Laterality Date  . Cesarean section    . Knee surgery    . Ceaserian    . Cesarean section    . Knee surgery     Family History  Problem Relation Age of Onset  . Hypertension Other   . Cancer Other    History  Substance Use Topics  . Smoking status: Never Smoker   . Smokeless tobacco: Never Used  . Alcohol Use: No   OB History    No data available     Review of Systems  Gastrointestinal: Positive for nausea and abdominal pain. Negative for vomiting, diarrhea and constipation.  Genitourinary: Positive for dysuria, urgency and frequency. Negative for hematuria, flank pain, vaginal bleeding,  vaginal discharge, difficulty urinating, vaginal pain and pelvic pain.  Musculoskeletal: Positive for back pain.  All other systems reviewed and are negative.     Allergies  Penicillins  Home Medications   Prior to Admission medications   Medication Sig Start Date End Date Taking? Authorizing Provider  albuterol (PROVENTIL HFA;VENTOLIN HFA) 108 (90 BASE) MCG/ACT inhaler Inhale 2 puffs into the lungs every 4 (four) hours as needed for wheezing or shortness of breath. 07/27/13   Merryl Hacker, MD  ciprofloxacin (CIPRO) 500 MG tablet Take 1 tablet (500 mg total) by mouth 2 (two) times daily. 06/28/14   Daouda Lonzo L Francisca Harbuck, PA-C   BP 118/65 mmHg  Pulse 80  Temp(Src) 97.8 F (36.6 C) (Oral)  Resp 20  Ht 5\' 7"  (1.702 m)  Wt 262 lb (118.842 kg)  BMI 41.03 kg/m2  SpO2 97%  LMP 06/21/2014 Physical Exam  Constitutional: She is oriented to person, place, and time. She appears well-developed and well-nourished. No distress.  HENT:  Head: Normocephalic and atraumatic.  Right Ear: External ear normal.  Left Ear: External ear normal.  Nose: Nose normal.  Mouth/Throat: Oropharynx is clear and moist.  Eyes: Conjunctivae are normal.  Neck: Normal range of motion. Neck supple.  Cardiovascular: Normal rate, regular rhythm and normal heart sounds.   Pulmonary/Chest: Effort normal and breath sounds normal. No respiratory distress.  Abdominal: Soft. Normal appearance and bowel sounds are normal. She exhibits no distension. There is no tenderness. There is no rigidity, no rebound, no guarding and no CVA tenderness.  Musculoskeletal: Normal range of motion.  Neurological: She is alert and oriented to person, place, and time.  Skin: Skin is warm and dry. She is not diaphoretic.  Psychiatric: She has a normal mood and affect.  Nursing note and vitals reviewed.   ED Course  Procedures (including critical care time) Medications  ondansetron (ZOFRAN-ODT) disintegrating tablet 8 mg (8 mg  Oral Given 06/28/14 2155)    Labs Review Labs Reviewed  URINALYSIS, ROUTINE W REFLEX MICROSCOPIC - Abnormal; Notable for the following:    Color, Urine RED (*)    APPearance TURBID (*)    Hgb urine dipstick SMALL (*)    Bilirubin Urine MODERATE (*)    Ketones, ur 40 (*)    Protein, ur 100 (*)    Urobilinogen, UA >8.0 (*)    Nitrite POSITIVE (*)    Leukocytes, UA LARGE (*)    All other components within normal limits  URINE MICROSCOPIC-ADD ON - Abnormal; Notable for the following:    Squamous Epithelial / LPF FEW (*)    Bacteria, UA MANY (*)    All other components within normal limits  PREGNANCY, URINE    Imaging Review No results found.   EKG Interpretation None      Urobilinuria likely secondary to Pyridium.   MDM   Final diagnoses:  UTI (lower urinary tract infection)    Filed Vitals:   06/28/14 2101  BP: 118/65  Pulse: 80  Temp: 97.8 F (36.6 C)  Resp: 20   Afebrile, NAD, non-toxic appearing, AAOx4.  Pt has been diagnosed with a UTI. Pt is afebrile, no CVA tenderness, normotensive, and denies vomiting. Pt to be dc home with antibiotics and instructions to follow up with PCP if symptoms persist. Return precautions discussed. Patient is agreeable to plan. Patient is stable at time of discharge        Harlow Mares, PA-C 06/28/14 [place], MD 06/28/14 2224

## 2014-06-28 NOTE — ED Notes (Signed)
C/o burning w urination x 1 day  Denies vag dc

## 2015-01-17 ENCOUNTER — Encounter (HOSPITAL_BASED_OUTPATIENT_CLINIC_OR_DEPARTMENT_OTHER): Payer: Self-pay | Admitting: Emergency Medicine

## 2015-01-17 ENCOUNTER — Emergency Department (HOSPITAL_BASED_OUTPATIENT_CLINIC_OR_DEPARTMENT_OTHER): Payer: No Typology Code available for payment source

## 2015-01-17 ENCOUNTER — Emergency Department (HOSPITAL_BASED_OUTPATIENT_CLINIC_OR_DEPARTMENT_OTHER)
Admission: EM | Admit: 2015-01-17 | Discharge: 2015-01-17 | Disposition: A | Discharge status: Home / Self Care | Payer: No Typology Code available for payment source | Attending: Emergency Medicine | Admitting: Emergency Medicine

## 2015-01-17 DIAGNOSIS — Z8719 Personal history of other diseases of the digestive system: Secondary | ICD-10-CM | POA: Insufficient documentation

## 2015-01-17 DIAGNOSIS — Z86010 Personal history of colonic polyps: Secondary | ICD-10-CM | POA: Insufficient documentation

## 2015-01-17 DIAGNOSIS — Z792 Long term (current) use of antibiotics: Secondary | ICD-10-CM | POA: Insufficient documentation

## 2015-01-17 DIAGNOSIS — Y998 Other external cause status: Secondary | ICD-10-CM | POA: Insufficient documentation

## 2015-01-17 DIAGNOSIS — Z880 Allergy status to penicillin: Secondary | ICD-10-CM | POA: Insufficient documentation

## 2015-01-17 DIAGNOSIS — Y9289 Other specified places as the place of occurrence of the external cause: Secondary | ICD-10-CM | POA: Insufficient documentation

## 2015-01-17 DIAGNOSIS — M25569 Pain in unspecified knee: Secondary | ICD-10-CM

## 2015-01-17 DIAGNOSIS — Y9389 Activity, other specified: Secondary | ICD-10-CM | POA: Insufficient documentation

## 2015-01-17 DIAGNOSIS — W172XXA Fall into hole, initial encounter: Secondary | ICD-10-CM | POA: Insufficient documentation

## 2015-01-17 DIAGNOSIS — S8991XA Unspecified injury of right lower leg, initial encounter: Secondary | ICD-10-CM | POA: Insufficient documentation

## 2015-01-17 DIAGNOSIS — R011 Cardiac murmur, unspecified: Secondary | ICD-10-CM | POA: Insufficient documentation

## 2015-01-17 DIAGNOSIS — Z87440 Personal history of urinary (tract) infections: Secondary | ICD-10-CM | POA: Insufficient documentation

## 2015-01-17 DIAGNOSIS — Z79899 Other long term (current) drug therapy: Secondary | ICD-10-CM | POA: Insufficient documentation

## 2015-01-17 DIAGNOSIS — Z9889 Other specified postprocedural states: Secondary | ICD-10-CM | POA: Insufficient documentation

## 2015-01-17 DIAGNOSIS — Z862 Personal history of diseases of the blood and blood-forming organs and certain disorders involving the immune mechanism: Secondary | ICD-10-CM | POA: Insufficient documentation

## 2015-01-17 MED ORDER — NAPROXEN 500 MG PO TABS: 500.0000 mg | ORAL_TABLET | Freq: Two times a day (BID) | ORAL | Status: DC

## 2015-01-17 MED ORDER — TRAMADOL HCL 50 MG PO TABS: 50.0000 mg | ORAL_TABLET | Freq: Four times a day (QID) | ORAL | Status: DC | PRN

## 2015-01-17 NOTE — ED Provider Notes (Signed)
CSN: [id number]     Arrival date & time 01/17/15  1828 History   First MD Initiated Contact with Patient 01/17/15 1848     Chief Complaint  Patient presents with  . Knee Pain     (Consider location/radiation/quality/duration/timing/severity/associated sxs/prior Treatment) HPI Comments: 44 year old female complaining of right knee pain 3 weeks, worsening today when she accidentally fell into a hole and twisted her knee. States her knee pain is "really bad", worse with walking, 10/10. No alleviating factors. Pain radiates down the front of her leg. No prior injury to her right knee. No numbness or tingling.  Patient is a 44 y.o. female presenting with knee pain. The history is provided by the patient.  Knee Pain   Past Medical History  Diagnosis Date  . Heart murmur   . Anemia   . UTI (lower urinary tract infection)   . Fibroids   . Acid reflux    Past Surgical History  Procedure Laterality Date  . Cesarean section    . Knee surgery    . Ceaserian    . Cesarean section    . Knee surgery     Family History  Problem Relation Age of Onset  . Hypertension Other   . Cancer Other    History  Substance Use Topics  . Smoking status: Never Smoker   . Smokeless tobacco: Never Used  . Alcohol Use: No   OB History    No data available     Review of Systems  Musculoskeletal:       + R knee pain.  All other systems reviewed and are negative.     Allergies  Penicillins  Home Medications   Prior to Admission medications   Medication Sig Start Date End Date Taking? Authorizing Provider  albuterol (PROVENTIL HFA;VENTOLIN HFA) 108 (90 BASE) MCG/ACT inhaler Inhale 2 puffs into the lungs every 4 (four) hours as needed for wheezing or shortness of breath. 07/27/13   Merryl Hacker, MD  ciprofloxacin (CIPRO) 500 MG tablet Take 1 tablet (500 mg total) by mouth 2 (two) times daily. 06/28/14   Jennifer Piepenbrink, PA-C  naproxen (NAPROSYN) 500 MG tablet Take 1 tablet (500 mg  total) by mouth 2 (two) times daily. 01/17/15   Daysie Helf M Josiel Gahm, PA-C  traMADol (ULTRAM) 50 MG tablet Take 1 tablet (50 mg total) by mouth every 6 (six) hours as needed. 01/17/15   Kamoria Lucien M Richardine Peppers, PA-C   BP 134/55 mmHg  Pulse 82  Temp(Src) 98.5 F (36.9 C) (Oral)  Resp 16  Ht 5\' 7"  (1.702 m)  Wt 257 lb (116.574 kg)  BMI 40.24 kg/m2  SpO2 100%  LMP 12/17/2014 Physical Exam  Constitutional: She is oriented to person, place, and time. She appears well-developed and well-nourished. No distress.  HENT:  Head: Normocephalic and atraumatic.  Mouth/Throat: Oropharynx is clear and moist.  Eyes: Conjunctivae and EOM are normal.  Neck: Normal range of motion. Neck supple.  Cardiovascular: Normal rate, regular rhythm and normal heart sounds.   Pulmonary/Chest: Effort normal and breath sounds normal. No respiratory distress.  Musculoskeletal:  R knee TTP anterior over patella and over lateral joint line. No swelling or deformity. Full range of motion. Pain with knee flexion and extension. Normal dorsi and plantar flexion of right foot. +2 PT/DP pulse. No ligamentous laxity appreciated, however exam limited by patient's body habitus.  Neurological: She is alert and oriented to person, place, and time. No sensory deficit.  Skin: Skin is warm and dry.  Psychiatric: She has a normal mood and affect. Her behavior is normal.  Nursing note and vitals reviewed.   ED Course  Procedures (including critical care time) Labs Review Labs Reviewed - No data to display  Imaging Review Dg Knee Complete 4 Views Right  01/17/2015   CLINICAL DATA:  Right knee pain for 3 weeks without known injury. The patient stepped in a hole today resulting in an increase in right knee pain. Initial encounter.  EXAM: RIGHT KNEE - COMPLETE 4+ VIEW  COMPARISON:  None.  FINDINGS: There is no evidence of fracture, dislocation, or joint effusion. Mild patellofemoral ditch patch that mild appearing patellofemoral degenerative change is  noted. Soft tissues are unremarkable.  IMPRESSION: Negative exam.   Electronically Signed   By: Inge Rise M.D.   On: 01/17/2015 19:26     EKG Interpretation None      MDM   Final diagnoses:  Right knee injury, initial encounter   Non-toxic appearing, NAD. AFVSS. Neurovascularly intact. Xray negative. Exam limited due to pt's body habitus. Knee immobilizer applied. Crutches given. Advised ice and NSAIDs. Rx naproxen and tramadol. F/u with ortho. Stable for d/c. Return precautions given. Patient states understanding of treatment care plan and is agreeable.  Carman Ching, PA-C 01/17/15 [place], MD 01/18/15 [phone]

## 2015-01-17 NOTE — ED Notes (Signed)
patient reports that she has had pain to her right knee x 3 weeks. The patient reports that she fell into a hole today and now it is worse

## 2015-01-17 NOTE — Discharge Instructions (Signed)
Take naproxen as prescribed. Take tramadol as directed for pain. Ice and elevate your knee. If you are at home doing nothing, take the knee immobilizer off, elevate your knee and apply ice.  Knee Pain The knee is the complex joint between your thigh and your lower leg. It is made up of bones, tendons, ligaments, and cartilage. The bones that make up the knee are:  The femur in the thigh.  The tibia and fibula in the lower leg.  The patella or kneecap riding in the groove on the lower femur. CAUSES  Knee pain is a common complaint with many causes. A few of these causes are:  Injury, such as:  A ruptured ligament or tendon injury.  Torn cartilage.  Medical conditions, such as:  Gout  Arthritis  Infections  Overuse, over training, or overdoing a physical activity. Knee pain can be minor or severe. Knee pain can accompany debilitating injury. Minor knee problems often respond well to self-care measures or get well on their own. More serious injuries may need medical intervention or even surgery. SYMPTOMS The knee is complex. Symptoms of knee problems can vary widely. Some of the problems are:  Pain with movement and weight bearing.  Swelling and tenderness.  Buckling of the knee.  Inability to straighten or extend your knee.  Your knee locks and you cannot straighten it.  Warmth and redness with pain and fever.  Deformity or dislocation of the kneecap. DIAGNOSIS  Determining what is wrong may be very straight forward such as when there is an injury. It can also be challenging because of the complexity of the knee. Tests to make a diagnosis may include:  Your caregiver taking a history and doing a physical exam.  Routine X-rays can be used to rule out other problems. X-rays will not reveal a cartilage tear. Some injuries of the knee can be diagnosed by:  Arthroscopy a surgical technique by which a small video camera is inserted through tiny incisions on the sides of  the knee. This procedure is used to examine and repair internal knee joint problems. Tiny instruments can be used during arthroscopy to repair the torn knee cartilage (meniscus).  Arthrography is a radiology technique. A contrast liquid is directly injected into the knee joint. Internal structures of the knee joint then become visible on X-ray film.  An MRI scan is a non X-ray radiology procedure in which magnetic fields and a computer produce two- or three-dimensional images of the inside of the knee. Cartilage tears are often visible using an MRI scanner. MRI scans have largely replaced arthrography in diagnosing cartilage tears of the knee.  Blood work.  Examination of the fluid that helps to lubricate the knee joint (synovial fluid). This is done by taking a sample out using a needle and a syringe. TREATMENT The treatment of knee problems depends on the cause. Some of these treatments are:  Depending on the injury, proper casting, splinting, surgery, or physical therapy care will be needed.  Give yourself adequate recovery time. Do not overuse your joints. If you begin to get sore during workout routines, back off. Slow down or do fewer repetitions.  For repetitive activities such as cycling or running, maintain your strength and nutrition.  Alternate muscle groups. For example, if you are a weight lifter, work the upper body on one day and the lower body the next.  Either tight or weak muscles do not give the proper support for your knee. Tight or weak muscles do  not absorb the stress placed on the knee joint. Keep the muscles surrounding the knee strong.  Take care of mechanical problems.  If you have flat feet, orthotics or special shoes may help. See your caregiver if you need help.  Arch supports, sometimes with wedges on the inner or outer aspect of the heel, can help. These can shift pressure away from the side of the knee most bothered by osteoarthritis.  A brace called an  "unloader" brace also may be used to help ease the pressure on the most arthritic side of the knee.  If your caregiver has prescribed crutches, braces, wraps or ice, use as directed. The acronym for this is PRICE. This means protection, rest, ice, compression, and elevation.  Nonsteroidal anti-inflammatory drugs (NSAIDs), can help relieve pain. But if taken immediately after an injury, they may actually increase swelling. Take NSAIDs with food in your stomach. Stop them if you develop stomach problems. Do not take these if you have a history of ulcers, stomach pain, or bleeding from the bowel. Do not take without your caregiver's approval if you have problems with fluid retention, heart failure, or kidney problems.  For ongoing knee problems, physical therapy may be helpful.  Glucosamine and chondroitin are over-the-counter dietary supplements. Both may help relieve the pain of osteoarthritis in the knee. These medicines are different from the usual anti-inflammatory drugs. Glucosamine may decrease the rate of cartilage destruction.  Injections of a corticosteroid drug into your knee joint may help reduce the symptoms of an arthritis flare-up. They may provide pain relief that lasts a few months. You may have to wait a few months between injections. The injections do have a small increased risk of infection, water retention, and elevated blood sugar levels.  Hyaluronic acid injected into damaged joints may ease pain and provide lubrication. These injections may work by reducing inflammation. A series of shots may give relief for as long as 6 months.  Topical painkillers. Applying certain ointments to your skin may help relieve the pain and stiffness of osteoarthritis. Ask your pharmacist for suggestions. Many over the-counter products are approved for temporary relief of arthritis pain.  In some countries, doctors often prescribe topical NSAIDs for relief of chronic conditions such as arthritis and  tendinitis. A review of treatment with NSAID creams found that they worked as well as oral medications but without the serious side effects. PREVENTION  Maintain a healthy weight. Extra pounds put more strain on your joints.  Get strong, stay limber. Weak muscles are a common cause of knee injuries. Stretching is important. Include flexibility exercises in your workouts.  Be smart about exercise. If you have osteoarthritis, chronic knee pain or recurring injuries, you may need to change the way you exercise. This does not mean you have to stop being active. If your knees ache after jogging or playing basketball, consider switching to swimming, water aerobics, or other low-impact activities, at least for a few days a week. Sometimes limiting high-impact activities will provide relief.  Make sure your shoes fit well. Choose footwear that is right for your sport.  Protect your knees. Use the proper gear for knee-sensitive activities. Use kneepads when playing volleyball or laying carpet. Buckle your seat belt every time you drive. Most shattered kneecaps occur in car accidents.  Rest when you are tired. SEEK MEDICAL CARE IF:  You have knee pain that is continual and does not seem to be getting better.  SEEK IMMEDIATE MEDICAL CARE IF:  Your knee joint  feels hot to the touch and you have a high fever. MAKE SURE YOU:   Understand these instructions.  Will watch your condition.  Will get help right away if you are not doing well or get worse. Document Released: 05/20/2007 Document Revised: 10/15/2011 Document Reviewed: 05/20/2007 [facility] Patient Information 2015 [place], [place]. This information is not intended to replace advice given to you by your health care provider. Make sure you discuss any questions you have with your health care provider.

## 2015-04-04 ENCOUNTER — Emergency Department (HOSPITAL_BASED_OUTPATIENT_CLINIC_OR_DEPARTMENT_OTHER)
Admission: EM | Admit: 2015-04-04 | Discharge: 2015-04-04 | Disposition: A | Discharge status: Home / Self Care | Payer: No Typology Code available for payment source | Attending: Emergency Medicine | Admitting: Emergency Medicine

## 2015-04-04 ENCOUNTER — Encounter (HOSPITAL_BASED_OUTPATIENT_CLINIC_OR_DEPARTMENT_OTHER): Payer: Self-pay | Admitting: *Deleted

## 2015-04-04 DIAGNOSIS — Z880 Allergy status to penicillin: Secondary | ICD-10-CM | POA: Insufficient documentation

## 2015-04-04 DIAGNOSIS — Z862 Personal history of diseases of the blood and blood-forming organs and certain disorders involving the immune mechanism: Secondary | ICD-10-CM | POA: Insufficient documentation

## 2015-04-04 DIAGNOSIS — R011 Cardiac murmur, unspecified: Secondary | ICD-10-CM | POA: Insufficient documentation

## 2015-04-04 DIAGNOSIS — E669 Obesity, unspecified: Secondary | ICD-10-CM | POA: Insufficient documentation

## 2015-04-04 DIAGNOSIS — Z79899 Other long term (current) drug therapy: Secondary | ICD-10-CM | POA: Insufficient documentation

## 2015-04-04 DIAGNOSIS — J029 Acute pharyngitis, unspecified: Secondary | ICD-10-CM | POA: Insufficient documentation

## 2015-04-04 DIAGNOSIS — Z86018 Personal history of other benign neoplasm: Secondary | ICD-10-CM | POA: Insufficient documentation

## 2015-04-04 DIAGNOSIS — N390 Urinary tract infection, site not specified: Secondary | ICD-10-CM | POA: Insufficient documentation

## 2015-04-04 DIAGNOSIS — Z3202 Encounter for pregnancy test, result negative: Secondary | ICD-10-CM | POA: Insufficient documentation

## 2015-04-04 DIAGNOSIS — Z792 Long term (current) use of antibiotics: Secondary | ICD-10-CM | POA: Insufficient documentation

## 2015-04-04 DIAGNOSIS — Z791 Long term (current) use of non-steroidal anti-inflammatories (NSAID): Secondary | ICD-10-CM | POA: Insufficient documentation

## 2015-04-04 DIAGNOSIS — Z8719 Personal history of other diseases of the digestive system: Secondary | ICD-10-CM | POA: Insufficient documentation

## 2015-04-04 LAB — URINALYSIS, ROUTINE W REFLEX MICROSCOPIC
BILIRUBIN URINE: NEGATIVE
Glucose, UA: NEGATIVE mg/dL
Hgb urine dipstick: NEGATIVE
KETONES UR: NEGATIVE mg/dL
Nitrite: NEGATIVE
PH: 6 (ref 5.0–8.0)
PROTEIN: NEGATIVE mg/dL
Specific Gravity, Urine: 1.02 (ref 1.005–1.030)
Urobilinogen, UA: 0.2 mg/dL (ref 0.0–1.0)

## 2015-04-04 LAB — URINE MICROSCOPIC-ADD ON

## 2015-04-04 LAB — PREGNANCY, URINE: Preg Test, Ur: NEGATIVE

## 2015-04-04 MED ORDER — SULFAMETHOXAZOLE-TRIMETHOPRIM 800-160 MG PO TABS: 1.0000 | ORAL_TABLET | Freq: Two times a day (BID) | ORAL | Status: AC

## 2015-04-04 MED ORDER — DM-GUAIFENESIN ER 30-600 MG PO TB12: 1.0000 | ORAL_TABLET | Freq: Two times a day (BID) | ORAL | Status: DC

## 2015-04-04 NOTE — Discharge Instructions (Signed)
You were evaluated in the ED today for your symptoms and does not appear to be an emergent cause at this time. You were found to have a UTI. Please take your antibiotics as directed. Do not save share your antibiotics. Follow-up with your doctor as needed for reevaluation. Return to ED for new or worsening symptoms.  Sore Throat A sore throat is pain, burning, irritation, or scratchiness of the throat. There is often pain or tenderness when swallowing or talking. A sore throat may be accompanied by other symptoms, such as coughing, sneezing, fever, and swollen neck glands. A sore throat is often the first sign of another sickness, such as a cold, flu, strep throat, or mononucleosis (commonly known as mono). Most sore throats go away without medical treatment. CAUSES  The most common causes of a sore throat include:  A viral infection, such as a cold, flu, or mono.  A bacterial infection, such as strep throat, tonsillitis, or whooping cough.  Seasonal allergies.  Dryness in the air.  Irritants, such as smoke or pollution.  Gastroesophageal reflux disease (GERD). HOME CARE INSTRUCTIONS   Only take over-the-counter medicines as directed by your caregiver.  Drink enough fluids to keep your urine clear or pale yellow.  Rest as needed.  Try using throat sprays, lozenges, or sucking on hard candy to ease any pain (if older than 4 years or as directed).  Sip warm liquids, such as broth, herbal tea, or warm water with honey to relieve pain temporarily. You may also eat or drink cold or frozen liquids such as frozen ice pops.  Gargle with salt water (mix 1 tsp salt with 8 oz of water).  Do not smoke and avoid secondhand smoke.  Put a cool-mist humidifier in your bedroom at night to moisten the air. You can also turn on a hot shower and sit in the bathroom with the door closed for 5-10 minutes. SEEK IMMEDIATE MEDICAL CARE IF:  You have difficulty breathing.  You are unable to swallow  fluids, soft foods, or your saliva.  You have increased swelling in the throat.  Your sore throat does not get better in 7 days.  You have nausea and vomiting.  You have a fever or persistent symptoms for more than 2-3 days.  You have a fever and your symptoms suddenly get worse. MAKE SURE YOU:   Understand these instructions.  Will watch your condition.  Will get help right away if you are not doing well or get worse. Document Released: 08/30/2004 Document Revised: 07/09/2012 Document Reviewed: 03/30/2012 [facility] Patient Information 2015 [place], [place]. This information is not intended to replace advice given to you by your health care provider. Make sure you discuss any questions you have with your health care provider.  Urinary Tract Infection Urinary tract infections (UTIs) can develop anywhere along your urinary tract. Your urinary tract is your body's drainage system for removing wastes and extra water. Your urinary tract includes two kidneys, two ureters, a bladder, and a urethra. Your kidneys are a pair of bean-shaped organs. Each kidney is about the size of your fist. They are located below your ribs, one on each side of your spine. CAUSES Infections are caused by microbes, which are microscopic organisms, including fungi, viruses, and bacteria. These organisms are so small that they can only be seen through a microscope. Bacteria are the microbes that most commonly cause UTIs. SYMPTOMS  Symptoms of UTIs may vary by age and gender of the patient and by the location  of the infection. Symptoms in young women typically include a frequent and intense urge to urinate and a painful, burning feeling in the bladder or urethra during urination. Older women and men are more likely to be tired, shaky, and weak and have muscle aches and abdominal pain. A fever may mean the infection is in your kidneys. Other symptoms of a kidney infection include pain in your back or sides below the ribs,  nausea, and vomiting. DIAGNOSIS To diagnose a UTI, your caregiver will ask you about your symptoms. Your caregiver also will ask to provide a urine sample. The urine sample will be tested for bacteria and white blood cells. White blood cells are made by your body to help fight infection. TREATMENT  Typically, UTIs can be treated with medication. Because most UTIs are caused by a bacterial infection, they usually can be treated with the use of antibiotics. The choice of antibiotic and length of treatment depend on your symptoms and the type of bacteria causing your infection. HOME CARE INSTRUCTIONS  If you were prescribed antibiotics, take them exactly as your caregiver instructs you. Finish the medication even if you feel better after you have only taken some of the medication.  Drink enough water and fluids to keep your urine clear or pale yellow.  Avoid caffeine, tea, and carbonated beverages. They tend to irritate your bladder.  Empty your bladder often. Avoid holding urine for long periods of time.  Empty your bladder before and after sexual intercourse.  After a bowel movement, women should cleanse from front to back. Use each tissue only once. SEEK MEDICAL CARE IF:   You have back pain.  You develop a fever.  Your symptoms do not begin to resolve within 3 days. SEEK IMMEDIATE MEDICAL CARE IF:   You have severe back pain or lower abdominal pain.  You develop chills.  You have nausea or vomiting.  You have continued burning or discomfort with urination. MAKE SURE YOU:   Understand these instructions.  Will watch your condition.  Will get help right away if you are not doing well or get worse. Document Released: 05/02/2005 Document Revised: 01/22/2012 Document Reviewed: 08/31/2011 [facility] Patient Information 2015 [place], [place]. This information is not intended to replace advice given to you by your health care provider. Make sure you discuss any questions you have with  your health care provider.

## 2015-04-04 NOTE — ED Provider Notes (Signed)
CSN: [id number]     Arrival date & time 04/04/15  1308 History   First MD Initiated Contact with Patient 04/04/15 [phone]     Chief Complaint  Patient presents with  . Sore Throat     (Consider location/radiation/quality/duration/timing/severity/associated sxs/prior Treatment) HPI Karen Lutz is a 44 y.o. female who comes in for evaluation of multiple complaints. Patient says she has had a sore throat for the past one day, a runny nose, productive green phlegm cough, headaches for the past 2 days. She has tried taking Benadryl without relief of her symptoms. Headaches are typical presentation pattern for her. She reports feeling hot yesterday but denies any measured fevers. She also reports intermittent dysuria for the past 3 weeks. She has tried cranberry juice without complete relief of her symptoms. She denies any other nausea, vomiting, abdominal pain, pelvic pain, vaginal bleeding or discharge. No recent travel or surgeries, no unilateral leg swelling, no hemoptysis, no OCPs, no history of blood clot.  Past Medical History  Diagnosis Date  . Heart murmur   . Anemia   . UTI (lower urinary tract infection)   . Fibroids   . Acid reflux    Past Surgical History  Procedure Laterality Date  . Cesarean section    . Knee surgery    . Ceaserian    . Cesarean section    . Knee surgery     Family History  Problem Relation Age of Onset  . Hypertension Other   . Cancer Other    Social History  Substance Use Topics  . Smoking status: Never Smoker   . Smokeless tobacco: Never Used  . Alcohol Use: No   OB History    No data available     Review of Systems A 10 point review of systems was completed and was negative except for pertinent positives and negatives as mentioned in the history of present illness     Allergies  Penicillins  Home Medications   Prior to Admission medications   Medication Sig Start Date End Date Taking? Authorizing Provider  albuterol  (PROVENTIL HFA;VENTOLIN HFA) 108 (90 BASE) MCG/ACT inhaler Inhale 2 puffs into the lungs every 4 (four) hours as needed for wheezing or shortness of breath. 07/27/13   Merryl Hacker, MD  ciprofloxacin (CIPRO) 500 MG tablet Take 1 tablet (500 mg total) by mouth 2 (two) times daily. 06/28/14   Jennifer Piepenbrink, PA-C  dextromethorphan-guaiFENesin (MUCINEX DM) 30-600 MG per 12 hr tablet Take 1 tablet by mouth 2 (two) times daily. 04/04/15   Comer Locket, PA-C  naproxen (NAPROSYN) 500 MG tablet Take 1 tablet (500 mg total) by mouth 2 (two) times daily. 01/17/15   Robyn M Hess, PA-C  sulfamethoxazole-trimethoprim (BACTRIM DS,SEPTRA DS) 800-160 MG per tablet Take 1 tablet by mouth 2 (two) times daily. 04/04/15 04/11/15  Comer Locket, PA-C  traMADol (ULTRAM) 50 MG tablet Take 1 tablet (50 mg total) by mouth every 6 (six) hours as needed. 01/17/15   Robyn M Hess, PA-C   BP 129/65 mmHg  Pulse 92  Temp(Src) 99 F (37.2 C) (Oral)  Resp 20  Ht 5\' 6"  (1.676 m)  Wt 250 lb (113.399 kg)  BMI 40.37 kg/m2  SpO2 100%  LMP 03/20/2015 (Exact Date) Physical Exam  Constitutional: She is oriented to person, place, and time. She appears well-developed and well-nourished. No distress.  Obese  HENT:  Head: Normocephalic and atraumatic.  Mouth/Throat: Oropharynx is clear and moist. No oropharyngeal exudate.  Oropharynx is normal  in appearance. No unilateral tonsillar swelling, trismus, glossal elevation or other abnormalities noted. Patent airway and tolerating secretions well.  Eyes: Conjunctivae and EOM are normal. Pupils are equal, round, and reactive to light. Right eye exhibits no discharge. Left eye exhibits no discharge. No scleral icterus.  Neck: Normal range of motion. Neck supple.  No meningismus or nuchal rigidity.  Cardiovascular: Normal rate, regular rhythm and normal heart sounds.   Pulmonary/Chest: Effort normal and breath sounds normal. No respiratory distress. She has no wheezes. She has no  rales.  Abdominal: Soft. There is no tenderness. There is no rebound and no guarding.  Musculoskeletal: Normal range of motion. She exhibits no tenderness.  Lymphadenopathy:    She has no cervical adenopathy.  Neurological: She is alert and oriented to person, place, and time.  Cranial Nerves II-XII grossly intact  Skin: Skin is warm and dry. No rash noted. She is not diaphoretic.  Psychiatric: She has a normal mood and affect.  Nursing note and vitals reviewed.   ED Course  Procedures (including critical care time) Labs Review Labs Reviewed  URINALYSIS, ROUTINE W REFLEX MICROSCOPIC (NOT AT [facility]) - Abnormal; Notable for the following:    Leukocytes, UA MODERATE (*)    All other components within normal limits  URINE MICROSCOPIC-ADD ON - Abnormal; Notable for the following:    Squamous Epithelial / LPF FEW (*)    Bacteria, UA MANY (*)    All other components within normal limits  PREGNANCY, URINE  PATHOLOGIST SMEAR REVIEW    Imaging Review No results found. I have personally reviewed and evaluated these images and lab results as part of my medical decision-making.   EKG Interpretation None     Meds given in ED:  Medications - No data to display  New Prescriptions   DEXTROMETHORPHAN-GUAIFENESIN (MUCINEX DM) 30-600 MG PER 12 HR TABLET    Take 1 tablet by mouth 2 (two) times daily.   SULFAMETHOXAZOLE-TRIMETHOPRIM (BACTRIM DS,SEPTRA DS) 800-160 MG PER TABLET    Take 1 tablet by mouth 2 (two) times daily.   Filed Vitals:   04/04/15 0844  BP: 129/65  Pulse: 92  Temp: 99 F (37.2 C)  TempSrc: Oral  Resp: 20  Height: 5\' 6"  (1.676 m)  Weight: 250 lb (113.399 kg)  SpO2: 100%    MDM  Vitals stable - WNL -afebrile Pt resting comfortably in ED. PE-- Benign lung and abdominal exam. Otherwise grossly benign Labwork--pregnancy negative, evidence of UTI on urinalysis with moderate leuks and 21-50 white cells.  DDX--patient with one-day history of URI-type symptoms. Low  suspicion for bacterial process. Sore throat with no oropharyngeal erythema, no unilateral tonsillar swelling, no trismus and no glossal elevation. Doubt other emergent cardiopulmonary process like PE, pneumonia. Benign lung exam, afebrile. Patient symptoms likely viral in etiology. Given prescription for cough medicines. She was also found to have a UTI, will treat empirically.  I discussed all relevant lab findings and imaging results with pt and they verbalized understanding. Discussed f/u with PCP within 48 hrs and return precautions, pt very amenable to plan.  Final diagnoses:  Sore throat  UTI (lower urinary tract infection)        Comer Locket, PA-C 04/04/15 [place], MD 04/04/15 1546

## 2015-04-04 NOTE — ED Notes (Signed)
Patient states she has a one week history of a sore throat, which has progressively gotten worse.  Yesterday, she developed sinus congestion, sneezing, productive cough with yellow green secretions, chills, and a headache.  Possible fever.  States last night around 2300 hours, she states her throat hurt so bad she could not swallow.  C/O urinary urgency, with no discharge.

## 2015-04-05 LAB — PATHOLOGIST SMEAR REVIEW

## 2015-08-04 ENCOUNTER — Encounter (HOSPITAL_COMMUNITY): Payer: Self-pay | Admitting: *Deleted

## 2015-08-04 ENCOUNTER — Emergency Department (HOSPITAL_COMMUNITY)
Admission: EM | Admit: 2015-08-04 | Discharge: 2015-08-04 | Disposition: A | Discharge status: Home / Self Care | Payer: No Typology Code available for payment source | Attending: Emergency Medicine | Admitting: Emergency Medicine

## 2015-08-04 DIAGNOSIS — Z880 Allergy status to penicillin: Secondary | ICD-10-CM | POA: Insufficient documentation

## 2015-08-04 DIAGNOSIS — Z86018 Personal history of other benign neoplasm: Secondary | ICD-10-CM | POA: Insufficient documentation

## 2015-08-04 DIAGNOSIS — Z87440 Personal history of urinary (tract) infections: Secondary | ICD-10-CM | POA: Insufficient documentation

## 2015-08-04 DIAGNOSIS — G43809 Other migraine, not intractable, without status migrainosus: Secondary | ICD-10-CM | POA: Insufficient documentation

## 2015-08-04 DIAGNOSIS — R011 Cardiac murmur, unspecified: Secondary | ICD-10-CM | POA: Insufficient documentation

## 2015-08-04 DIAGNOSIS — Z79899 Other long term (current) drug therapy: Secondary | ICD-10-CM | POA: Insufficient documentation

## 2015-08-04 DIAGNOSIS — D649 Anemia, unspecified: Secondary | ICD-10-CM | POA: Insufficient documentation

## 2015-08-04 DIAGNOSIS — Z8719 Personal history of other diseases of the digestive system: Secondary | ICD-10-CM | POA: Insufficient documentation

## 2015-08-04 LAB — COMPREHENSIVE METABOLIC PANEL
ALT: 5 U/L — AB (ref 14–54)
AST: 11 U/L — AB (ref 15–41)
Albumin: 3.9 g/dL (ref 3.5–5.0)
Alkaline Phosphatase: 68 U/L (ref 38–126)
Anion gap: 8 (ref 5–15)
BILIRUBIN TOTAL: 0.5 mg/dL (ref 0.3–1.2)
BUN: 13 mg/dL (ref 6–20)
CHLORIDE: 107 mmol/L (ref 101–111)
CO2: 26 mmol/L (ref 22–32)
CREATININE: 0.62 mg/dL (ref 0.44–1.00)
Calcium: 9.4 mg/dL (ref 8.9–10.3)
GFR calc Af Amer: >60 mL/min (ref >60)
GFR calc non Af Amer: >60 mL/min (ref >60)
Glucose, Bld: 102 mg/dL — ABNORMAL HIGH (ref 65–99)
Potassium: 3.7 mmol/L (ref 3.5–5.1)
Sodium: 141 mmol/L (ref 135–145)
Total Protein: 7.2 g/dL (ref 6.5–8.1)

## 2015-08-04 LAB — POC OCCULT BLOOD, ED: Fecal Occult Bld: NEGATIVE

## 2015-08-04 LAB — CBC
HCT: 24.6 % — ABNORMAL LOW (ref 36.0–46.0)
Hemoglobin: 6.6 g/dL — CL (ref 12.0–15.0)
MCH: 16.4 pg — ABNORMAL LOW (ref 26.0–34.0)
MCHC: 26.8 g/dL — AB (ref 30.0–36.0)
MCV: 61.2 fL — AB (ref 78.0–100.0)
Platelets: 417 10*3/uL — ABNORMAL HIGH (ref 150–400)
RBC: 4.02 MIL/uL (ref 3.87–5.11)
RDW: 20 % — AB (ref 11.5–15.5)
WBC: 5.1 10*3/uL (ref 4.0–10.5)

## 2015-08-04 LAB — URINALYSIS, ROUTINE W REFLEX MICROSCOPIC
BILIRUBIN URINE: NEGATIVE
GLUCOSE, UA: NEGATIVE mg/dL
Hgb urine dipstick: NEGATIVE
KETONES UR: NEGATIVE mg/dL
Leukocytes, UA: NEGATIVE
Nitrite: NEGATIVE
PH: 6.5 (ref 5.0–8.0)
Protein, ur: NEGATIVE mg/dL
Specific Gravity, Urine: 1.022 (ref 1.005–1.030)

## 2015-08-04 LAB — LIPASE, BLOOD: Lipase: 40 U/L (ref 11–51)

## 2015-08-04 MED ORDER — KETOROLAC TROMETHAMINE 30 MG/ML IJ SOLN
30.0000 mg | Freq: Once | INTRAMUSCULAR | Status: AC
  Administered 2015-08-04: 30 mg via INTRAVENOUS
  Filled 2015-08-04: qty 1

## 2015-08-04 MED ORDER — SODIUM CHLORIDE 0.9 % IV BOLUS (SEPSIS)
1000.0000 mL | Freq: Once | INTRAVENOUS | Status: AC
  Administered 2015-08-04: 1000 mL via INTRAVENOUS

## 2015-08-04 MED ORDER — ACETAMINOPHEN 500 MG PO TABS
1000.0000 mg | ORAL_TABLET | Freq: Once | ORAL | Status: DC
  Filled 2015-08-04 (×2): qty 2

## 2015-08-04 MED ORDER — DIPHENHYDRAMINE HCL 50 MG/ML IJ SOLN
25.0000 mg | Freq: Once | INTRAMUSCULAR | Status: AC
  Administered 2015-08-04: 25 mg via INTRAVENOUS
  Filled 2015-08-04: qty 1

## 2015-08-04 MED ORDER — PROMETHAZINE HCL 25 MG/ML IJ SOLN
25.0000 mg | Freq: Once | INTRAMUSCULAR | Status: AC
  Administered 2015-08-04: 25 mg via INTRAVENOUS
  Filled 2015-08-04: qty 1

## 2015-08-04 NOTE — ED Provider Notes (Signed)
CSN: [id number]     Arrival date & time 08/04/15  1230 History   First MD Initiated Contact with Patient 08/04/15 1611     Chief Complaint  Patient presents with  . Anemia     (Consider location/radiation/quality/duration/timing/severity/associated sxs/prior Treatment) HPI 44 year old female with history of chronic anemia and uterine fibroids who presents with headache and nausea. Days that 2 days ago developed a typical migraine headache, bitemporal with photophobia. Pain gradually has increased 8 out of 10 in severity. Associated with some blurry vision. Has been picking up extra late shifts over the holidays, which she believes or trigger for her migraine headaches. Denies any recent fevers, chills, cough, sob, fatigue, syncope or near syncope, N/V/D, abdominal pain, dysuria, chest pain. No neck pain, speech changes, double vision, numbness or weakness.   Past Medical History  Diagnosis Date  . Heart murmur   . Anemia   . UTI (lower urinary tract infection)   . Fibroids   . Acid reflux    Past Surgical History  Procedure Laterality Date  . Cesarean section    . Knee surgery    . Ceaserian    . Cesarean section    . Knee surgery     Family History  Problem Relation Age of Onset  . Hypertension Other   . Cancer Other    Social History  Substance Use Topics  . Smoking status: Never Smoker   . Smokeless tobacco: Never Used  . Alcohol Use: No   OB History    No data available     Review of Systems 10/14 systems reviewed and are negative other than those stated in the HPI    Allergies  Penicillins  Home Medications   Prior to Admission medications   Medication Sig Start Date End Date Taking? Authorizing Provider  albuterol (PROVENTIL HFA;VENTOLIN HFA) 108 (90 BASE) MCG/ACT inhaler Inhale 2 puffs into the lungs every 4 (four) hours as needed for wheezing or shortness of breath. 07/27/13  Yes Merryl Hacker, MD  dextromethorphan-guaiFENesin [facility] DM) 30-600  MG per 12 hr tablet Take 1 tablet by mouth 2 (two) times daily. Patient not taking: Reported on 08/04/2015 04/04/15   Comer Locket, PA-C   BP 112/51 mmHg  Pulse 79  Temp(Src) 98.6 F (37 C) (Oral)  Resp 14  SpO2 100%  LMP 07/27/2015 Physical Exam Physical Exam  Nursing note and vitals reviewed. Constitutional: Well developed, well nourished, non-toxic, and in no acute distress Head: Normocephalic and atraumatic.  Mouth/Throat: Oropharynx is clear and moist.  Neck: Normal range of motion. Neck supple.  Cardiovascular: Normal rate and regular rhythm.   Pulmonary/Chest: Effort normal and breath sounds normal.  Abdominal: Soft. There is no tenderness. There is no rebound and no guarding.  Musculoskeletal: Normal range of motion.  Skin: Skin is warm and dry.  Psychiatric: Cooperative Neurological:  Alert, oriented to person, place, time, and situation. Memory grossly in tact. Fluent speech. No dysarthria or aphasia.  Cranial nerves: VF are full. Pupils are symmetric, and reactive to light. EOMI without nystagmus. No gaze deviation. Facial muscles symmetric with activation. Sensation to light touch over face in tact bilaterally. Hearing grossly in tact. Palate elevates symmetrically. Head turn and shoulder shrug are intact. Tongue midline.  Reflexes defered.  Muscle bulk and tone normal. No pronator drift. Moves all extremities symmetrically. Sensation to light touch is in tact throughout in bilateral upper and lower extremities. Coordination reveals no dysmetria with finger to nose. Gait is narrow-based and steady.  Non-ataxic.  ED Course  Procedures (including critical care time) Labs Review Labs Reviewed  COMPREHENSIVE METABOLIC PANEL - Abnormal; Notable for the following:    Glucose, Bld 102 (*)    AST 11 (*)    ALT 5 (*)    All other components within normal limits  CBC - Abnormal; Notable for the following:    Hemoglobin 6.6 (*)    HCT 24.6 (*)    MCV 61.2 (*)    MCH  16.4 (*)    MCHC 26.8 (*)    RDW 20.0 (*)    Platelets 417 (*)    All other components within normal limits  URINALYSIS, ROUTINE W REFLEX MICROSCOPIC (NOT AT [facility]) - Abnormal; Notable for the following:    APPearance CLOUDY (*)    All other components within normal limits  LIPASE, BLOOD  POC OCCULT BLOOD, ED    Imaging Review No results found. I have personally reviewed and evaluated these images and lab results as part of my medical decision-making.   EKG Interpretation None      MDM   Final diagnoses:  Other migraine without status migrainosus, not intractable  Chronic anemia    44 year old female who presents with headache and nausea consistent with prior episodes of migraine headaches. VS stable and with neurologically in tact exam. Symptoms consistent with known migraines. Received IVF, phenergan, benadryl, and Toradol with resolution for headache. No concern for acute or serious secondary cause of her headache.   Has history of chronic anemia per patient. Last transfused in 2004. States hgb chronically in 6, which she has been multiple times on chart review in 2014 and 2015. Hgb 6.7 today, which seems to be baseline. Is asymptomatic and with stable vital signs. Do not think she needs acute transfusion. Reported dark stools recently, but guaiac negative and no blood or melena in rectum. States heavy menses 5-6 days, last 1 week ago which may be etiology. Has received referrals to GI for w/u but never presented and takes iron pills in past, but not currently. Discussed need for outpatient w/u of this. Given continued referral to GI as well as follow-up with community health and wellness, as currently without PCP.   Appropriate for discharge home. Strict return and follow-up instructions reviewed. she expressed understanding of all discharge instructions and felt comfortable with the plan of care.     Forde Dandy, MD 08/05/15 [phone]

## 2015-08-04 NOTE — Discharge Instructions (Signed)
Return for worsening symptoms, including worsening pain, vomiting and unable to keep down food/fluids, extreme fatigue, difficulty breathing, chest pain, bloody or black stools, new numbness or weakness, or any other symptoms concerning to you.  Anemia, Nonspecific Anemia is a condition in which the concentration of red blood cells or hemoglobin in the blood is below normal. Hemoglobin is a substance in red blood cells that carries oxygen to the tissues of the body. Anemia results in not enough oxygen reaching these tissues.  CAUSES  Common causes of anemia include:   Excessive bleeding. Bleeding may be internal or external. This includes excessive bleeding from periods (in women) or from the intestine.   Poor nutrition.   Chronic kidney, thyroid, and liver disease.  Bone marrow disorders that decrease red blood cell production.  Cancer and treatments for cancer.  HIV, AIDS, and their treatments.  Spleen problems that increase red blood cell destruction.  Blood disorders.  Excess destruction of red blood cells due to infection, medicines, and autoimmune disorders. SIGNS AND SYMPTOMS   Minor weakness.   Dizziness.   Headache.  Palpitations.   Shortness of breath, especially with exercise.   Paleness.  Cold sensitivity.  Indigestion.  Nausea.  Difficulty sleeping.  Difficulty concentrating. Symptoms may occur suddenly or they may develop slowly.  DIAGNOSIS  Additional blood tests are often needed. These help your health care provider determine the best treatment. Your health care provider will check your stool for blood and look for other causes of blood loss.  TREATMENT  Treatment varies depending on the cause of the anemia. Treatment can include:   Supplements of iron, vitamin 123456, or folic acid.   Hormone medicines.   A blood transfusion. This may be needed if blood loss is severe.   Hospitalization. This may be needed if there is significant  continual blood loss.   Dietary changes.  Spleen removal. HOME CARE INSTRUCTIONS Keep all follow-up appointments. It often takes many weeks to correct anemia, and having your health care provider check on your condition and your response to treatment is very important. SEEK IMMEDIATE MEDICAL CARE IF:   You develop extreme weakness, shortness of breath, or chest pain.   You become dizzy or have trouble concentrating.  You develop heavy vaginal bleeding.   You develop a rash.   You have bloody or black, tarry stools.   You faint.   You vomit up blood.   You vomit repeatedly.   You have abdominal pain.  You have a fever or persistent symptoms for more than 2-3 days.   You have a fever and your symptoms suddenly get worse.   You are dehydrated.  MAKE SURE YOU:  Understand these instructions.  Will watch your condition.  Will get help right away if you are not doing well or get worse.   This information is not intended to replace advice given to you by your health care provider. Make sure you discuss any questions you have with your health care provider.   Document Released: 08/30/2004 Document Revised: 03/25/2013 Document Reviewed: 01/16/2013 Elsevier Interactive Patient Education 2016 [employer].  Migraine Headache A migraine headache is very bad, throbbing pain on one or both sides of your head. Talk to your doctor about what things may bring on (trigger) your migraine headaches. HOME CARE  Only take medicines as told by your doctor.  Lie down in a dark, quiet room when you have a migraine.  Keep a journal to find out if certain things bring  on migraine headaches. For example, write down:  What you eat and drink.  How much sleep you get.  Any change to your diet or medicines.  Lessen how much alcohol you drink.  Quit smoking if you smoke.  Get enough sleep.  Lessen any stress in your life.  Keep lights dim if bright lights bother you or  make your migraines worse. GET HELP RIGHT AWAY IF:   Your migraine becomes really bad.  You have a fever.  You have a stiff neck.  You have trouble seeing.  Your muscles are weak, or you lose muscle control.  You lose your balance or have trouble walking.  You feel like you will pass out (faint), or you pass out.  You have really bad symptoms that are different than your first symptoms. MAKE SURE YOU:   Understand these instructions.  Will watch your condition.  Will get help right away if you are not doing well or get worse.   This information is not intended to replace advice given to you by your health care provider. Make sure you discuss any questions you have with your health care provider.   Document Released: 05/01/2008 Document Revised: 10/15/2011 Document Reviewed: 03/30/2013 Elsevier Interactive Patient Education [employer].

## 2015-08-04 NOTE — ED Notes (Signed)
Pt complains of nausea and 9/10 headache since yesterday morning. Pt states she took ibuprofen yesterday with no relief. Pt denies emesis, abdominal pain, diarrhea.

## 2015-08-04 NOTE — ED Notes (Signed)
Critical HBG 6.6 from lab.

## 2015-08-04 NOTE — ED Notes (Signed)
Nurse attempting to start a line at this time, will complete [employer] when RN is complete.

## 2015-08-04 NOTE — ED Notes (Signed)
Attempted IV x2 without success, asking Erline Levine, RN to attempt.

## 2015-09-03 ENCOUNTER — Encounter (HOSPITAL_COMMUNITY): Payer: Self-pay | Admitting: *Deleted

## 2015-09-03 ENCOUNTER — Emergency Department (HOSPITAL_COMMUNITY): Payer: BLUE CROSS/BLUE SHIELD

## 2015-09-03 ENCOUNTER — Emergency Department (HOSPITAL_COMMUNITY)
Admission: EM | Admit: 2015-09-03 | Discharge: 2015-09-03 | Disposition: A | Discharge status: Home / Self Care | Payer: BLUE CROSS/BLUE SHIELD | Attending: Emergency Medicine | Admitting: Emergency Medicine

## 2015-09-03 DIAGNOSIS — Z87440 Personal history of urinary (tract) infections: Secondary | ICD-10-CM | POA: Diagnosis not present

## 2015-09-03 DIAGNOSIS — R112 Nausea with vomiting, unspecified: Secondary | ICD-10-CM

## 2015-09-03 DIAGNOSIS — Z3202 Encounter for pregnancy test, result negative: Secondary | ICD-10-CM | POA: Insufficient documentation

## 2015-09-03 DIAGNOSIS — R011 Cardiac murmur, unspecified: Secondary | ICD-10-CM | POA: Insufficient documentation

## 2015-09-03 DIAGNOSIS — D649 Anemia, unspecified: Secondary | ICD-10-CM | POA: Diagnosis not present

## 2015-09-03 DIAGNOSIS — R1084 Generalized abdominal pain: Secondary | ICD-10-CM

## 2015-09-03 DIAGNOSIS — Z86018 Personal history of other benign neoplasm: Secondary | ICD-10-CM | POA: Insufficient documentation

## 2015-09-03 DIAGNOSIS — K2970 Gastritis, unspecified, without bleeding: Secondary | ICD-10-CM | POA: Diagnosis not present

## 2015-09-03 DIAGNOSIS — Z79899 Other long term (current) drug therapy: Secondary | ICD-10-CM | POA: Diagnosis not present

## 2015-09-03 LAB — COMPREHENSIVE METABOLIC PANEL
ALT: 6 U/L — AB (ref 14–54)
AST: 12 U/L — AB (ref 15–41)
Albumin: 3.6 g/dL (ref 3.5–5.0)
Alkaline Phosphatase: 59 U/L (ref 38–126)
Anion gap: 9 (ref 5–15)
BUN: 7 mg/dL (ref 6–20)
CHLORIDE: 107 mmol/L (ref 101–111)
CO2: 23 mmol/L (ref 22–32)
CREATININE: 0.61 mg/dL (ref 0.44–1.00)
Calcium: 9.4 mg/dL (ref 8.9–10.3)
GFR calc Af Amer: >60 mL/min (ref >60)
GFR calc non Af Amer: >60 mL/min (ref >60)
GLUCOSE: 90 mg/dL (ref 65–99)
Potassium: 3.9 mmol/L (ref 3.5–5.1)
SODIUM: 139 mmol/L (ref 135–145)
Total Bilirubin: 0.4 mg/dL (ref 0.3–1.2)
Total Protein: 7.1 g/dL (ref 6.5–8.1)

## 2015-09-03 LAB — URINE MICROSCOPIC-ADD ON: RBC / HPF: NONE SEEN RBC/hpf (ref 0–5)

## 2015-09-03 LAB — URINALYSIS, ROUTINE W REFLEX MICROSCOPIC
Bilirubin Urine: NEGATIVE
GLUCOSE, UA: NEGATIVE mg/dL
HGB URINE DIPSTICK: NEGATIVE
Ketones, ur: NEGATIVE mg/dL
Nitrite: NEGATIVE
PROTEIN: NEGATIVE mg/dL
Specific Gravity, Urine: 1.02 (ref 1.005–1.030)
pH: 5.5 (ref 5.0–8.0)

## 2015-09-03 LAB — CBC
HCT: 24.7 % — ABNORMAL LOW (ref 36.0–46.0)
Hemoglobin: 6.7 g/dL — CL (ref 12.0–15.0)
MCH: 16.5 pg — AB (ref 26.0–34.0)
MCHC: 27.1 g/dL — AB (ref 30.0–36.0)
MCV: 60.7 fL — AB (ref 78.0–100.0)
PLATELETS: 296 10*3/uL (ref 150–400)
RBC: 4.07 MIL/uL (ref 3.87–5.11)
RDW: 19.5 % — AB (ref 11.5–15.5)
WBC: 3 10*3/uL — ABNORMAL LOW (ref 4.0–10.5)

## 2015-09-03 LAB — POC URINE PREG, ED: Preg Test, Ur: NEGATIVE

## 2015-09-03 LAB — LIPASE, BLOOD: LIPASE: 34 U/L (ref 11–51)

## 2015-09-03 MED ORDER — ONDANSETRON HCL 4 MG/2ML IJ SOLN
4.0000 mg | Freq: Once | INTRAMUSCULAR | Status: AC
Start: 2015-09-03 — End: 2015-09-03
  Administered 2015-09-03: 4 mg via INTRAVENOUS
  Filled 2015-09-03: qty 2

## 2015-09-03 MED ORDER — IOHEXOL 300 MG/ML  SOLN
25.0000 mL | Freq: Once | INTRAMUSCULAR | Status: AC | PRN
  Administered 2015-09-03: 25 mL via ORAL

## 2015-09-03 MED ORDER — POLYSACCHARIDE IRON COMPLEX 150 MG PO CAPS: 150.0000 mg | ORAL_CAPSULE | Freq: Every day | ORAL | Status: DC

## 2015-09-03 MED ORDER — IOHEXOL 300 MG/ML  SOLN
100.0000 mL | Freq: Once | INTRAMUSCULAR | Status: AC | PRN
  Administered 2015-09-03: 100 mL via INTRAVENOUS

## 2015-09-03 MED ORDER — PANTOPRAZOLE SODIUM 20 MG PO TBEC: 20.0000 mg | DELAYED_RELEASE_TABLET | Freq: Two times a day (BID) | ORAL | Status: DC

## 2015-09-03 MED ORDER — ONDANSETRON HCL 4 MG PO TABS: 4.0000 mg | ORAL_TABLET | Freq: Four times a day (QID) | ORAL | Status: DC

## 2015-09-03 MED ORDER — FENTANYL CITRATE (PF) 100 MCG/2ML IJ SOLN
50.0000 ug | Freq: Once | INTRAMUSCULAR | Status: AC
  Administered 2015-09-03: 50 ug via INTRAVENOUS
  Filled 2015-09-03: qty 2

## 2015-09-03 MED ORDER — SODIUM CHLORIDE 0.9 % IV BOLUS (SEPSIS)
1000.0000 mL | Freq: Once | INTRAVENOUS | Status: AC
  Administered 2015-09-03: 1000 mL via INTRAVENOUS

## 2015-09-03 NOTE — ED Notes (Signed)
Pt reports mid abd pain with n/v since yesterday.  Pt reports vomiting more than 10 times yesterday.  Was able to drink some ginger ale.  Denies any diarrhea.

## 2015-09-03 NOTE — Discharge Instructions (Signed)
Anemia, Nonspecific Anemia is a condition in which the concentration of red blood cells or hemoglobin in the blood is below normal. Hemoglobin is a substance in red blood cells that carries oxygen to the tissues of the body. Anemia results in not enough oxygen reaching these tissues.  CAUSES  Common causes of anemia include:   Excessive bleeding. Bleeding may be internal or external. This includes excessive bleeding from periods (in women) or from the intestine.   Poor nutrition.   Chronic kidney, thyroid, and liver disease.  Bone marrow disorders that decrease red blood cell production.  Cancer and treatments for cancer.  HIV, AIDS, and their treatments.  Spleen problems that increase red blood cell destruction.  Blood disorders.  Excess destruction of red blood cells due to infection, medicines, and autoimmune disorders. SIGNS AND SYMPTOMS   Minor weakness.   Dizziness.   Headache.  Palpitations.   Shortness of breath, especially with exercise.   Paleness.  Cold sensitivity.  Indigestion.  Nausea.  Difficulty sleeping.  Difficulty concentrating. Symptoms may occur suddenly or they may develop slowly.  DIAGNOSIS  Additional blood tests are often needed. These help your health care provider determine the best treatment. Your health care provider will check your stool for blood and look for other causes of blood loss.  TREATMENT  Treatment varies depending on the cause of the anemia. Treatment can include:   Supplements of iron, vitamin 123456, or folic acid.   Hormone medicines.   A blood transfusion. This may be needed if blood loss is severe.   Hospitalization. This may be needed if there is significant continual blood loss.   Dietary changes.  Spleen removal. HOME CARE INSTRUCTIONS Keep all follow-up appointments. It often takes many weeks to correct anemia, and having your health care provider check on your condition and your response to  treatment is very important. SEEK IMMEDIATE MEDICAL CARE IF:   You develop extreme weakness, shortness of breath, or chest pain.   You become dizzy or have trouble concentrating.  You develop heavy vaginal bleeding.   You develop a rash.   You have bloody or black, tarry stools.   You faint.   You vomit up blood.   You vomit repeatedly.   You have abdominal pain.  You have a fever or persistent symptoms for more than 2-3 days.   You have a fever and your symptoms suddenly get worse.   You are dehydrated.  MAKE SURE YOU:  Understand these instructions.  Will watch your condition.  Will get help right away if you are not doing well or get worse.   This information is not intended to replace advice given to you by your health care provider. Make sure you discuss any questions you have with your health care provider.   Document Released: 08/30/2004 Document Revised: 03/25/2013 Document Reviewed: 01/16/2013 Elsevier Interactive Patient Education 2016 [employer]  Abdominal Pain, Adult Many things can cause abdominal pain. Usually, abdominal pain is not caused by a disease and will improve without treatment. It can often be observed and treated at home. Your health care provider will do a physical exam and possibly order blood tests and X-rays to help determine the seriousness of your pain. However, in many cases, more time must pass before a clear cause of the pain can be found. Before that point, your health care provider may not know if you need more testing or further treatment. HOME CARE INSTRUCTIONS Monitor your abdominal pain for any changes. The  following actions may help to alleviate any discomfort you are experiencing:  Only take over-the-counter or prescription medicines as directed by your health care provider.  Do not take laxatives unless directed to do so by your health care provider.  Try a clear liquid diet (broth, tea, or water) as directed by  your health care provider. Slowly move to a bland diet as tolerated. SEEK MEDICAL CARE IF:  You have unexplained abdominal pain.  You have abdominal pain associated with nausea or diarrhea.  You have pain when you urinate or have a bowel movement.  You experience abdominal pain that wakes you in the night.  You have abdominal pain that is worsened or improved by eating food.  You have abdominal pain that is worsened with eating fatty foods.  You have a fever. SEEK IMMEDIATE MEDICAL CARE IF:  Your pain does not go away within 2 hours.  You keep throwing up (vomiting).  Your pain is felt only in portions of the abdomen, such as the right side or the left lower portion of the abdomen.  You pass bloody or black tarry stools. MAKE SURE YOU:  Understand these instructions.  Will watch your condition.  Will get help right away if you are not doing well or get worse.   This information is not intended to replace advice given to you by your health care provider. Make sure you discuss any questions you have with your health care provider.   Document Released: 05/02/2005 Document Revised: 04/13/2015 Document Reviewed: 04/01/2013 Elsevier Interactive Patient Education [employer].

## 2015-09-03 NOTE — ED Notes (Signed)
RN starting IV currently

## 2015-09-03 NOTE — ED Provider Notes (Signed)
CSN: [id number]     Arrival date & time 09/03/15  [id number] History   First MD Initiated Contact with Patient 09/03/15 [phone]     Chief Complaint  Patient presents with  . Abdominal Pain     Patient is a 45 y.o. female presenting with abdominal pain. The history is provided by the patient. No language interpreter was used.  Abdominal Pain  Karen Lutz is a 45 y.o. female who presents to the Emergency Department complaining of abdominal pain and vomiting. Yesterday she developed significant vomiting in the morning followed by diffuse abdominal pain. She denies any fevers but has had chills at home. She has no hematemesis, no diarrhea, constipation, hematochezia, melena, dysuria, vaginal discharge. No known sick contacts or bad food exposures. She has a history of anemia and prior C-section.  No prior similar symptoms. Symptoms are moderate, constant, worsening. She reports associated migraine type headache.  Past Medical History  Diagnosis Date  . Heart murmur   . Anemia   . UTI (lower urinary tract infection)   . Fibroids   . Acid reflux    Past Surgical History  Procedure Laterality Date  . Cesarean section    . Knee surgery    . Ceaserian    . Cesarean section    . Knee surgery     Family History  Problem Relation Age of Onset  . Hypertension Other   . Cancer Other    Social History  Substance Use Topics  . Smoking status: Never Smoker   . Smokeless tobacco: Never Used  . Alcohol Use: No   OB History    No data available     Review of Systems  Gastrointestinal: Positive for abdominal pain.  All other systems reviewed and are negative.     Allergies  Penicillins  Home Medications   Prior to Admission medications   Medication Sig Start Date End Date Taking? Authorizing Provider  albuterol (PROVENTIL HFA;VENTOLIN HFA) 108 (90 BASE) MCG/ACT inhaler Inhale 2 puffs into the lungs every 4 (four) hours as needed for wheezing or shortness of breath. 07/27/13  Yes  Merryl Hacker, MD  ibuprofen (ADVIL,MOTRIN) 200 MG tablet Take 400 mg by mouth every 6 (six) hours as needed for moderate pain.   Yes Historical Provider, MD  iron polysaccharides (NIFEREX) 150 MG capsule Take 1 capsule (150 mg total) by mouth daily. 09/03/15   Quintella Reichert, MD  ondansetron (ZOFRAN) 4 MG tablet Take 1 tablet (4 mg total) by mouth every 6 (six) hours. 09/03/15   Quintella Reichert, MD  pantoprazole (PROTONIX) 20 MG tablet Take 1 tablet (20 mg total) by mouth 2 (two) times daily. 09/03/15   Quintella Reichert, MD   BP 116/55 mmHg  Pulse 65  Temp(Src) 98.4 F (36.9 C) (Oral)  Resp 17  SpO2 99%  LMP 07/13/2015 Physical Exam  Constitutional: She is oriented to person, place, and time. She appears well-developed and well-nourished.  HENT:  Head: Normocephalic and atraumatic.  Cardiovascular: Normal rate and regular rhythm.   No murmur heard. Pulmonary/Chest: Effort normal and breath sounds normal. No respiratory distress.  Abdominal: Soft. There is no rebound and no guarding.  Mild central and right-sided abdominal tenderness without guarding or rebound  Musculoskeletal: She exhibits no edema or tenderness.  Neurological: She is alert and oriented to person, place, and time.  Skin: Skin is warm and dry.  Psychiatric: She has a normal mood and affect. Her behavior is normal.  Nursing note and vitals reviewed.  ED Course  Procedures (including critical care time) Labs Review Labs Reviewed  COMPREHENSIVE METABOLIC PANEL - Abnormal; Notable for the following:    AST 12 (*)    ALT 6 (*)    All other components within normal limits  CBC - Abnormal; Notable for the following:    WBC 3.0 (*)    Hemoglobin 6.7 (*)    HCT 24.7 (*)    MCV 60.7 (*)    MCH 16.5 (*)    MCHC 27.1 (*)    RDW 19.5 (*)    All other components within normal limits  URINALYSIS, ROUTINE W REFLEX MICROSCOPIC (NOT AT [facility]) - Abnormal; Notable for the following:    APPearance HAZY (*)    Leukocytes, UA  SMALL (*)    All other components within normal limits  URINE MICROSCOPIC-ADD ON - Abnormal; Notable for the following:    Squamous Epithelial / LPF 6-30 (*)    Bacteria, UA RARE (*)    All other components within normal limits  LIPASE, BLOOD  POC URINE PREG, ED    Imaging Review Ct Abdomen Pelvis W Contrast  09/03/2015  CLINICAL DATA:  Mid abdominal pain, nausea, vomiting since yesterday. EXAM: CT ABDOMEN AND PELVIS WITH CONTRAST TECHNIQUE: Multidetector CT imaging of the abdomen and pelvis was performed using the standard protocol following bolus administration of intravenous contrast. CONTRAST:  56mL OMNIPAQUE IOHEXOL 300 MG/ML SOLN, 151mL OMNIPAQUE IOHEXOL 300 MG/ML SOLN COMPARISON:  Ultrasound 07/03/2013.  CT 05/12/2010. FINDINGS: Lung bases are clear.  No effusions.  Heart is normal size. Liver, gallbladder, spleen, pancreas, adrenals and kidneys are normal. Appendix is normal. Bowel grossly unremarkable. No free fluid, free air, or adenopathy. Uterus, adnexa and urinary bladder normal. No acute bony abnormality or focal bone lesion. IMPRESSION: No acute findings or significant abnormality in the abdomen or pelvis. Electronically Signed   By: Rolm Baptise M.D.   On: 09/03/2015 12:37   I have personally reviewed and evaluated these images and lab results as part of my medical decision-making.   EKG Interpretation None      MDM   Final diagnoses:  Generalized abdominal pain  Non-intractable vomiting with nausea, vomiting of unspecified type  Gastritis  Chronic anemia    Patient here for evaluation of abdominal pain, vomiting. No evidence of acute appendicitis or acute abnormality on CT scan. CBC demonstrates stable anemia compared to priors. She is not currently taking any form of iron supplementation and does not currently have follow-up. Discussed with patient findings of gastritis, home care with oral fluid hydration, PPI, outpatient follow-up. She feels improved on repeat  evaluation emergency Department is able to tolerate oral fluids. She was also provided with contact information for hematology for follow of her chronic anemia that she states has been refractory to oral iron supplementation in the past.    Quintella Reichert, MD 09/03/15 1553

## 2015-09-04 ENCOUNTER — Emergency Department (HOSPITAL_COMMUNITY): Payer: BLUE CROSS/BLUE SHIELD

## 2015-09-04 ENCOUNTER — Encounter (HOSPITAL_COMMUNITY): Payer: Self-pay

## 2015-09-04 ENCOUNTER — Emergency Department (HOSPITAL_COMMUNITY)
Admission: EM | Admit: 2015-09-04 | Discharge: 2015-09-04 | Disposition: A | Discharge status: Home / Self Care | Payer: BLUE CROSS/BLUE SHIELD | Attending: Emergency Medicine | Admitting: Emergency Medicine

## 2015-09-04 DIAGNOSIS — Z79899 Other long term (current) drug therapy: Secondary | ICD-10-CM | POA: Diagnosis not present

## 2015-09-04 DIAGNOSIS — R011 Cardiac murmur, unspecified: Secondary | ICD-10-CM | POA: Diagnosis not present

## 2015-09-04 DIAGNOSIS — R1013 Epigastric pain: Secondary | ICD-10-CM | POA: Insufficient documentation

## 2015-09-04 DIAGNOSIS — D649 Anemia, unspecified: Secondary | ICD-10-CM | POA: Insufficient documentation

## 2015-09-04 DIAGNOSIS — E669 Obesity, unspecified: Secondary | ICD-10-CM | POA: Diagnosis not present

## 2015-09-04 DIAGNOSIS — Z86018 Personal history of other benign neoplasm: Secondary | ICD-10-CM | POA: Insufficient documentation

## 2015-09-04 DIAGNOSIS — K219 Gastro-esophageal reflux disease without esophagitis: Secondary | ICD-10-CM | POA: Insufficient documentation

## 2015-09-04 DIAGNOSIS — Z87440 Personal history of urinary (tract) infections: Secondary | ICD-10-CM | POA: Insufficient documentation

## 2015-09-04 DIAGNOSIS — R1110 Vomiting, unspecified: Secondary | ICD-10-CM | POA: Insufficient documentation

## 2015-09-04 DIAGNOSIS — R1010 Upper abdominal pain, unspecified: Secondary | ICD-10-CM | POA: Diagnosis present

## 2015-09-04 DIAGNOSIS — Z880 Allergy status to penicillin: Secondary | ICD-10-CM | POA: Diagnosis not present

## 2015-09-04 LAB — HEMOGLOBIN AND HEMATOCRIT, BLOOD
HEMATOCRIT: 25.1 % — AB (ref 36.0–46.0)
HEMOGLOBIN: 6.6 g/dL — AB (ref 12.0–15.0)

## 2015-09-04 LAB — POC OCCULT BLOOD, ED: FECAL OCCULT BLD: NEGATIVE

## 2015-09-04 MED ORDER — SUCRALFATE 1 G PO TABS: 1.0000 g | ORAL_TABLET | Freq: Three times a day (TID) | ORAL | Status: DC

## 2015-09-04 MED ORDER — DICYCLOMINE HCL 20 MG PO TABS: 20.0000 mg | ORAL_TABLET | Freq: Two times a day (BID) | ORAL | Status: DC

## 2015-09-04 MED ORDER — DICYCLOMINE HCL 10 MG PO CAPS
10.0000 mg | ORAL_CAPSULE | Freq: Once | ORAL | Status: AC
  Administered 2015-09-04: 10 mg via ORAL
  Filled 2015-09-04: qty 1

## 2015-09-04 MED ORDER — SUCRALFATE 1 G PO TABS
1.0000 g | ORAL_TABLET | Freq: Once | ORAL | Status: AC
  Administered 2015-09-04: 1 g via ORAL
  Filled 2015-09-04: qty 1

## 2015-09-04 NOTE — ED Notes (Signed)
Awake. Verbally responsive. A/O x4. Resp even and unlabored. No audible adventitious breath sounds noted. ABC's intact.  

## 2015-09-04 NOTE — Discharge Instructions (Signed)
Anemia, Nonspecific Anemia is a condition in which the concentration of red blood cells or hemoglobin in the blood is below normal. Hemoglobin is a substance in red blood cells that carries oxygen to the tissues of the body. Anemia results in not enough oxygen reaching these tissues.  CAUSES  Common causes of anemia include:   Excessive bleeding. Bleeding may be internal or external. This includes excessive bleeding from periods (in women) or from the intestine.   Poor nutrition.   Chronic kidney, thyroid, and liver disease.  Bone marrow disorders that decrease red blood cell production.  Cancer and treatments for cancer.  HIV, AIDS, and their treatments.  Spleen problems that increase red blood cell destruction.  Blood disorders.  Excess destruction of red blood cells due to infection, medicines, and autoimmune disorders. SIGNS AND SYMPTOMS   Minor weakness.   Dizziness.   Headache.  Palpitations.   Shortness of breath, especially with exercise.   Paleness.  Cold sensitivity.  Indigestion.  Nausea.  Difficulty sleeping.  Difficulty concentrating. Symptoms may occur suddenly or they may develop slowly.  DIAGNOSIS  Additional blood tests are often needed. These help your health care provider determine the best treatment. Your health care provider will check your stool for blood and look for other causes of blood loss.  TREATMENT  Treatment varies depending on the cause of the anemia. Treatment can include:   Supplements of iron, vitamin 123456, or folic acid.   Hormone medicines.   A blood transfusion. This may be needed if blood loss is severe.   Hospitalization. This may be needed if there is significant continual blood loss.   Dietary changes.  Spleen removal. HOME CARE INSTRUCTIONS Keep all follow-up appointments. It often takes many weeks to correct anemia, and having your health care provider check on your condition and your response to  treatment is very important. SEEK IMMEDIATE MEDICAL CARE IF:   You develop extreme weakness, shortness of breath, or chest pain.   You become dizzy or have trouble concentrating.  You develop heavy vaginal bleeding.   You develop a rash.   You have bloody or black, tarry stools.   You faint.   You vomit up blood.   You vomit repeatedly.   You have abdominal pain.  You have a fever or persistent symptoms for more than 2-3 days.   You have a fever and your symptoms suddenly get worse.   You are dehydrated.  MAKE SURE YOU:  Understand these instructions.  Will watch your condition.  Will get help right away if you are not doing well or get worse.   This information is not intended to replace advice given to you by your health care provider. Make sure you discuss any questions you have with your health care provider.   Document Released: 08/30/2004 Document Revised: 03/25/2013 Document Reviewed: 01/16/2013 Elsevier Interactive Patient Education 2016 [employer]  Abdominal Pain, Adult Many things can cause abdominal pain. Usually, abdominal pain is not caused by a disease and will improve without treatment. It can often be observed and treated at home. Your health care provider will do a physical exam and possibly order blood tests and X-rays to help determine the seriousness of your pain. However, in many cases, more time must pass before a clear cause of the pain can be found. Before that point, your health care provider may not know if you need more testing or further treatment. HOME CARE INSTRUCTIONS Monitor your abdominal pain for any changes. The  following actions may help to alleviate any discomfort you are experiencing:  Only take over-the-counter or prescription medicines as directed by your health care provider.  Do not take laxatives unless directed to do so by your health care provider.  Try a clear liquid diet (broth, tea, or water) as directed by  your health care provider. Slowly move to a bland diet as tolerated. SEEK MEDICAL CARE IF:  You have unexplained abdominal pain.  You have abdominal pain associated with nausea or diarrhea.  You have pain when you urinate or have a bowel movement.  You experience abdominal pain that wakes you in the night.  You have abdominal pain that is worsened or improved by eating food.  You have abdominal pain that is worsened with eating fatty foods.  You have a fever. SEEK IMMEDIATE MEDICAL CARE IF:  Your pain does not go away within 2 hours.  You keep throwing up (vomiting).  Your pain is felt only in portions of the abdomen, such as the right side or the left lower portion of the abdomen.  You pass bloody or black tarry stools. MAKE SURE YOU:  Understand these instructions.  Will watch your condition.  Will get help right away if you are not doing well or get worse.   This information is not intended to replace advice given to you by your health care provider. Make sure you discuss any questions you have with your health care provider.   Document Released: 05/02/2005 Document Revised: 04/13/2015 Document Reviewed: 04/01/2013 Elsevier Interactive Patient Education [employer].

## 2015-09-04 NOTE — ED Notes (Signed)
Pt c/o generalized abdominal pain and n/v/d x 2 days.  Pain score 10/10.  Pt was seen at [facility] yesterday and diagnosed w/ gastritis.  Pt is upset that she was not sent home w/ pain medication.  Pt reports that the prescribed medications are not relieving symptoms.

## 2015-09-04 NOTE — ED Provider Notes (Signed)
CSN: [id number]     Arrival date & time 09/04/15  0846 History   First MD Initiated Contact with Patient 09/04/15 0920     Chief Complaint  Patient presents with  . Abdominal Pain  . Emesis  . Diarrhea     (Consider location/radiation/quality/duration/timing/severity/associated sxs/prior Treatment) HPI Patient reports that she's had burning upper abdominal pain that is been coming and going. In association with this she's had intermittent episodes of vomiting. He has a spasming and cramping quality to it. She denies associated diarrhea or constipation. She has not seen black or bloody stool. It has known anemia. He reports she does have heavy menstrual cycles. Last menstrual cycle was about 2 weeks ago. Current bleeding. Denies abnormal vaginal discharge. No pain or burning with urination. No known history of peptic ulcer. No chest pain weakness or lightheadedness. Patient was seen yesterday and had diagnostic evaluation. She reports that the pain has not improved. Past Medical History  Diagnosis Date  . Heart murmur   . Anemia   . UTI (lower urinary tract infection)   . Fibroids   . Acid reflux    Past Surgical History  Procedure Laterality Date  . Cesarean section    . Knee surgery    . Ceaserian    . Cesarean section    . Knee surgery     Family History  Problem Relation Age of Onset  . Hypertension Other   . Cancer Other    Social History  Substance Use Topics  . Smoking status: Never Smoker   . Smokeless tobacco: Never Used  . Alcohol Use: No   OB History    No data available     Review of Systems 10 Systems reviewed and are negative for acute change except as noted in the HPI.    Allergies  Penicillins  Home Medications   Prior to Admission medications   Medication Sig Start Date End Date Taking? Authorizing Provider  albuterol (PROVENTIL HFA;VENTOLIN HFA) 108 (90 BASE) MCG/ACT inhaler Inhale 2 puffs into the lungs every 4 (four) hours as needed for  wheezing or shortness of breath. 07/27/13  Yes Merryl Hacker, MD  ibuprofen (ADVIL,MOTRIN) 200 MG tablet Take 400 mg by mouth every 6 (six) hours as needed for moderate pain.   Yes Historical Provider, MD  iron polysaccharides (NIFEREX) 150 MG capsule Take 1 capsule (150 mg total) by mouth daily. 09/03/15  Yes Quintella Reichert, MD  ondansetron (ZOFRAN) 4 MG tablet Take 1 tablet (4 mg total) by mouth every 6 (six) hours. 09/03/15  Yes Quintella Reichert, MD  pantoprazole (PROTONIX) 20 MG tablet Take 1 tablet (20 mg total) by mouth 2 (two) times daily. 09/03/15  Yes Quintella Reichert, MD  dicyclomine (BENTYL) 20 MG tablet Take 1 tablet (20 mg total) by mouth 2 (two) times daily. 09/04/15   Charlesetta Shanks, MD  sucralfate (CARAFATE) 1 g tablet Take 1 tablet (1 g total) by mouth 4 (four) times daily -  with meals and at bedtime. 09/04/15   Charlesetta Shanks, MD   BP 118/78 mmHg  Pulse 65  Temp(Src) 97.8 F (36.6 C) (Oral)  Resp 18  SpO2 100%  LMP 08/31/2015 Physical Exam  Constitutional: She is oriented to person, place, and time. She appears well-developed and well-nourished.  Patient is obese. She is alert and nontoxic. No respiratory.  HENT:  Head: Normocephalic and atraumatic.  Eyes: EOM are normal. Pupils are equal, round, and reactive to light.  Neck: Neck supple.  Cardiovascular:  Normal rate, regular rhythm, normal heart sounds and intact distal pulses.   Pulmonary/Chest: Effort normal and breath sounds normal.  Abdominal: Soft. Bowel sounds are normal. She exhibits no distension. There is tenderness.  Moderate epigastric pain to palpation without guarding or rebound.  Genitourinary:  Rectal exam: Vault is empty of formed stool. There is trace brownish yellow stool in the vault.  Musculoskeletal: Normal range of motion. She exhibits no edema.  Neurological: She is alert and oriented to person, place, and time. She has normal strength. Coordination normal. GCS eye subscore is 4. GCS verbal subscore  is 5. GCS motor subscore is 6.  Skin: Skin is warm, dry and intact.  Psychiatric: She has a normal mood and affect.    ED Course  Procedures (including critical care time) Labs Review Labs Reviewed  HEMOGLOBIN AND HEMATOCRIT, BLOOD - Abnormal; Notable for the following:    Hemoglobin 6.6 (*)    HCT 25.1 (*)    All other components within normal limits  POC OCCULT BLOOD, ED    Imaging Review Ct Abdomen Pelvis W Contrast  09/03/2015  CLINICAL DATA:  Mid abdominal pain, nausea, vomiting since yesterday. EXAM: CT ABDOMEN AND PELVIS WITH CONTRAST TECHNIQUE: Multidetector CT imaging of the abdomen and pelvis was performed using the standard protocol following bolus administration of intravenous contrast. CONTRAST:  77mL OMNIPAQUE IOHEXOL 300 MG/ML SOLN, 162mL OMNIPAQUE IOHEXOL 300 MG/ML SOLN COMPARISON:  Ultrasound 07/03/2013.  CT 05/12/2010. FINDINGS: Lung bases are clear.  No effusions.  Heart is normal size. Liver, gallbladder, spleen, pancreas, adrenals and kidneys are normal. Appendix is normal. Bowel grossly unremarkable. No free fluid, free air, or adenopathy. Uterus, adnexa and urinary bladder normal. No acute bony abnormality or focal bone lesion. IMPRESSION: No acute findings or significant abnormality in the abdomen or pelvis. Electronically Signed   By: Rolm Baptise M.D.   On: 09/03/2015 12:37   US Abdomen Limited  09/04/2015  CLINICAL DATA:  Right upper quadrant pain, epigastric pain since Friday. EXAM: US ABDOMEN LIMITED - RIGHT UPPER QUADRANT COMPARISON:  CT 09/03/2015 FINDINGS: Gallbladder: No gallstones or wall thickening visualized. No sonographic Murphy sign noted by sonographer. Common bile duct: Diameter: Normal caliber, 3 mm Liver: No focal lesion identified. Within normal limits in parenchymal echogenicity. IMPRESSION: Unremarkable right upper quadrant ultrasound. Electronically Signed   By: Rolm Baptise M.D.   On: 09/04/2015 11:19   I have personally reviewed and evaluated  these images and lab results as part of my medical decision-making.   EKG Interpretation None      MDM   Final diagnoses:  Epigastric pain  Chronic anemia   Patient has been having ongoing epigastric pain. CT scan done yesterday did not show any acute abnormalities. Basic chemistry and liver function panel yesterday were normal. The patient has chronic anemia. She has stable vital signs. H&H was redrawn and there has been no change. Patient does not report any vaginal bleeding. Rectal examination is guaiac negative. Right upper quadrant ultrasound performed today to evaluate for possible biliary colic. Ultrasound does not show ductal dilation or stones present. At this time I do feel most likely etiology is gastritis. Patient was given prescription for Protonix yesterday. She is counseled to continue the Protonix daily and add Carafate as prescribed. She is also counseled on low-fat diet. Patient is also counseled to follow-up with gynecology regarding this chronic anemia which appears to be due to dysfunctional uterine bleeding. The patient is clinically stable with severe anemia, she is counseled  that best medical management would be to resolve the cause before secondary complications arise. Patient voices understanding for need of follow-up and clarification regarding this condition.    Charlesetta Shanks, MD 09/04/15 [phone]

## 2015-09-04 NOTE — ED Notes (Signed)
Ultrasound at bedside

## 2015-09-05 ENCOUNTER — Other Ambulatory Visit: Payer: Self-pay | Admitting: Oncology

## 2015-09-05 DIAGNOSIS — D518 Other vitamin B12 deficiency anemias: Secondary | ICD-10-CM

## 2015-09-06 ENCOUNTER — Telehealth: Payer: Self-pay | Admitting: Oncology

## 2015-09-06 NOTE — Telephone Encounter (Signed)
pof noted for new patient appointment and sne to HIM/tiff through inbox

## 2015-09-07 ENCOUNTER — Telehealth: Payer: Self-pay | Admitting: Behavioral Health

## 2015-09-07 NOTE — Telephone Encounter (Signed)
Unable to reach patient at time of Pre-Visit Call.  Left message for patient to return call when available.    

## 2015-09-08 ENCOUNTER — Encounter: Payer: Self-pay | Admitting: Medical

## 2015-09-08 ENCOUNTER — Ambulatory Visit (INDEPENDENT_AMBULATORY_CARE_PROVIDER_SITE_OTHER): Payer: BLUE CROSS/BLUE SHIELD | Admitting: Medical

## 2015-09-08 VITALS — BP 116/70 | HR 81 | Temp 98.1°F | Ht 66.0 in | Wt 263.0 lb

## 2015-09-08 DIAGNOSIS — D649 Anemia, unspecified: Secondary | ICD-10-CM | POA: Diagnosis not present

## 2015-09-08 DIAGNOSIS — N938 Other specified abnormal uterine and vaginal bleeding: Secondary | ICD-10-CM

## 2015-09-08 DIAGNOSIS — K219 Gastro-esophageal reflux disease without esophagitis: Secondary | ICD-10-CM

## 2015-09-08 LAB — RETICULOCYTES
ABS RETIC: 41.7 10*3/uL (ref 19.0–186.0)
RBC.: 3.79 MIL/uL — ABNORMAL LOW (ref 3.87–5.11)
RETIC CT PCT: 1.1 % (ref 0.4–2.3)

## 2015-09-08 NOTE — Progress Notes (Signed)
Subjective:    Patient ID: Karen Lutz, female    DOB: [birth year], 45 y.o.   MRN: [id number]  HPI  I have reviewed pt PMH, PSH, FH, Social History and Surgical History  Pt works at [employer] and at [employer], no exercise, drinks 1  coffee at work, pt admits not eating healthy, single- 3 children.  Anemia- found this past Saturday. Pt has known to be anemic and has been like this for years. Pt has chronic heavy vaginal bleeding. She will bleed heavily during week of menses and will use many tampons and pads. Pt has states known that she has fibroids. This month she did have heavy menstrual bleeding. LMP- was January 18 th and stopped Jan 25th.  In December had vaginal bleeding twice. Pt states some hx of black stools in past but stool testing never showed blood. No family hx of colon cancer.  Rt upper quadrant [place] of pt abdomen was negative other day in ED. Also no acute finding on Ct abdomen/pevlis.  ED dx thought dysfunctionial uterine bleeding.   Pt states does not feel tired.  Pt also has some epigastric pain this past Friday(that is betternow). But she also reports some epigastric pain in the past. She states told in past dx gerd. Given protonix recently and feels better. Pt never been seen by GI.  Pt states every since every since late teens 73-20 yo remembers being this anemic.    Review of Systems  Constitutional: Negative for fever, chills and fatigue.  Respiratory: Negative for cough, chest tightness, shortness of breath and wheezing.   Cardiovascular: Negative for chest pain and palpitations.  Gastrointestinal: Negative for nausea, vomiting, abdominal pain, diarrhea, constipation and blood in stool.       No pain presently.  Genitourinary: Negative for vaginal bleeding.       See hpi. No bleeding presently.  Musculoskeletal: Negative for back pain.  Skin: Negative for rash.  Neurological: Negative for dizziness, seizures, speech difficulty, weakness,  light-headedness and headaches.  Hematological: Negative for adenopathy. Does not bruise/bleed easily.    Past Medical History  Diagnosis Date  . Anemia   . UTI (lower urinary tract infection)   . Fibroids   . Acid reflux   . Heart murmur     Social History   Social History  . Marital Status: Legally Separated    Spouse Name: N/A  . Number of Children: N/A  . Years of Education: N/A   Occupational History  . Not on file.   Social History Main Topics  . Smoking status: Never Smoker   . Smokeless tobacco: Never Used  . Alcohol Use: No  . Drug Use: No  . Sexual Activity: Yes    Birth Control/ Protection: None   Other Topics Concern  . Not on file   Social History Narrative    Past Surgical History  Procedure Laterality Date  . Cesarean section    . Knee surgery    . Ceaserian    . Cesarean section    . Knee surgery      Family History  Problem Relation Age of Onset  . Hypertension Other   . Cancer Other   . Alcohol abuse Mother   . Heart disease Mother   . Alcohol abuse Father   . Heart disease Father   . Arthritis Maternal Grandmother   . Arthritis Maternal Grandfather   . Arthritis Paternal Grandmother   . Arthritis Paternal Grandfather     Allergies  Allergen Reactions  . Penicillins Rash    Has patient had a PCN reaction causing immediate rash, facial/tongue/throat swelling, SOB or lightheadedness with hypotension: unknown Has patient had a PCN reaction causing severe rash involving mucus membranes or skin necrosis: unknown Has patient had a PCN reaction that required hospitalization : yes Has patient had a PCN reaction occurring within the last 10 years: no If all of the above answers are "NO", then may proceed with Cephalosporin use.     Current Outpatient Prescriptions on File Prior to Visit  Medication Sig Dispense Refill  . albuterol (PROVENTIL HFA;VENTOLIN HFA) 108 (90 BASE) MCG/ACT inhaler Inhale 2 puffs into the lungs every 4 (four)  hours as needed for wheezing or shortness of breath. 1 Inhaler 0  . dicyclomine (BENTYL) 20 MG tablet Take 1 tablet (20 mg total) by mouth 2 (two) times daily. 20 tablet 0  . ibuprofen (ADVIL,MOTRIN) 200 MG tablet Take 400 mg by mouth every 6 (six) hours as needed for moderate pain.    . iron polysaccharides (NIFEREX) 150 MG capsule Take 1 capsule (150 mg total) by mouth daily. 30 capsule 0  . ondansetron (ZOFRAN) 4 MG tablet Take 1 tablet (4 mg total) by mouth every 6 (six) hours. 12 tablet 0  . pantoprazole (PROTONIX) 20 MG tablet Take 1 tablet (20 mg total) by mouth 2 (two) times daily. 30 tablet 0  . sucralfate (CARAFATE) 1 g tablet Take 1 tablet (1 g total) by mouth 4 (four) times daily -  with meals and at bedtime. 40 tablet 1   No current facility-administered medications on file prior to visit.    BP 116/70 mmHg  Pulse 81  Temp(Src) 98.1 F (36.7 C) (Oral)  Ht 5\' 6"  (1.676 m)  Wt 263 lb (119.296 kg)  BMI 42.47 kg/m2  SpO2 100%  LMP 08/31/2015       Objective:   Physical Exam  General Mental Status- Alert. General Appearance- Not in acute distress.   Skin General: Color- Normal Color. Moisture- Normal Moisture.  Neck Carotid Arteries- Normal color. Moisture- Normal Moisture. No carotid bruits. No JVD.  Chest and Lung Exam Auscultation: Breath Sounds:-Normal.  Cardiovascular Auscultation:Rythm- Regular. Murmurs & Other Heart Sounds:Auscultation of the heart reveals- No Murmurs.  Abdomen Inspection:-Inspeection Normal. Palpation/Percussion:Note:No mass. Palpation and Percussion of the abdomen reveal- Non Tender, Non Distended + BS, no rebound or guarding.    Neurologic Cranial Nerve exam:- CN III-XII intact(No nystagmus), symmetric smile. Strength:- 5/5 equal and symmetric strength both upper and lower extremities.      Assessment & Plan:  For chronic anemia I want you to continue iron. Will get cbc today and need to repeat cbc this coming Monday to make  sure anemia is improving. If you get any severe vaginal bleeding again or fatigue then ED evaluation.  For dysfunctional uterine bleeding will refer you to gyn asap.   For gerd history and anemia I do think getting GI referral and possible evaluation of your stomach is a good idea. Continue protonix.  For severe anemia you may benefit from hematologist evaluation such as smear, lactose dehydrogenase, haptoglobin and hemoglobinopathy evaluation.  Follow up this coming Monday to turn in ifob and repeat anemia/cbc check.

## 2015-09-08 NOTE — Progress Notes (Signed)
Pre visit review using our clinic review tool, if applicable. No additional management support is needed unless otherwise documented below in the visit note. 

## 2015-09-08 NOTE — Patient Instructions (Addendum)
For chronic anemia I want you to continue iron. Will get cbc today and need to repeat cbc this coming Monday to make sure anemia is improving. If you get any severe vaginal bleeding again or fatigue then ED evaluation.  For dysfunctional uterine bleeding will refer you to gyn asap.   For gerd history and anemia I do think getting GI referral and possible evaluation of your stomach is a good idea. Continue protonix.  For severe anemia you may benefit from hematologist evaluation such as smear, lactose dehydrogenase, haptoglobin and hemoglobinopathy evaluation.  Follow up this coming Monday to turn in ifob and repeat anemia/cbc check.

## 2015-09-09 ENCOUNTER — Telehealth: Payer: Self-pay | Admitting: Medical

## 2015-09-09 LAB — CBC WITH DIFFERENTIAL/PLATELET
BASOS PCT: 3.8 % — AB (ref 0.0–3.0)
Basophils Absolute: 0.1 10*3/uL (ref 0.0–0.1)
EOS ABS: 0 10*3/uL (ref 0.0–0.7)
Eosinophils Relative: 1.1 % (ref 0.0–5.0)
HCT: 22.2 % — CL (ref 36.0–46.0)
Lymphocytes Relative: 55.9 % — ABNORMAL HIGH (ref 12.0–46.0)
Lymphs Abs: 2.1 10*3/uL (ref 0.7–4.0)
MCHC: 27.8 g/dL — ABNORMAL LOW (ref 30.0–36.0)
MCV: 58 fl — ABNORMAL LOW (ref 78.0–100.0)
MONO ABS: 0.3 10*3/uL (ref 0.1–1.0)
Monocytes Relative: 8.4 % (ref 3.0–12.0)
Neutro Abs: 1.1 10*3/uL — ABNORMAL LOW (ref 1.4–7.7)
Neutrophils Relative %: 30.8 % — ABNORMAL LOW (ref 43.0–77.0)
Platelets: 213 10*3/uL (ref 150.0–400.0)
RBC: 3.82 Mil/uL — AB (ref 3.87–5.11)
RDW: 21 % — AB (ref 11.5–15.5)
WBC: 3.7 10*3/uL — AB (ref 4.0–10.5)

## 2015-09-09 LAB — IRON AND TIBC
%SAT: 2 % — AB (ref 11–50)
IRON: 10 ug/dL — AB (ref 40–190)
TIBC: 405 ug/dL (ref 250–450)
UIBC: 395 ug/dL (ref 125–400)

## 2015-09-09 LAB — FERRITIN: FERRITIN: 2.5 ng/mL — AB (ref 10.0–291.0)

## 2015-09-09 LAB — VITAMIN B12: Vitamin B-12: 262 pg/mL (ref 211–911)

## 2015-09-09 LAB — FOLATE: FOLATE: 12.7 ng/mL (ref >5.9)

## 2015-09-09 NOTE — Telephone Encounter (Signed)
I called pt and advised on hb/hct. Continue iron. Pt notified has appointment with gyn on Wed. Pleas Koch told me this earlier(I don't know time and will get our staff to notify her of the appointment time). Pt states she was not contacted. Pt then stated she needed appointment past 2:30 pm and that she can't go if earlier than 2:30. I explained on importance of going to referral and appears they likely worked her in since such quick date. I explained extremely important and I would write her a work not if she has concerns with her employer. Pt is coming in on Monday for repeat cbc.   Please call her and notify of appointment, location and date.

## 2015-09-09 NOTE — Telephone Encounter (Signed)
Discussed bp lab value and clinical presentaton, lab values and plan today 09-09-2015.

## 2015-09-12 ENCOUNTER — Telehealth: Payer: Self-pay | Admitting: Medical

## 2015-09-12 ENCOUNTER — Other Ambulatory Visit: Payer: BLUE CROSS/BLUE SHIELD

## 2015-09-12 NOTE — Telephone Encounter (Signed)
09/14/15 @11am 

## 2015-09-12 NOTE — Telephone Encounter (Signed)
Pt had a appointment at @0700  no-show .Marland Kitchen

## 2015-09-12 NOTE — Telephone Encounter (Signed)
Did you see my referral request note. What time in afternoon is this pt appointment.

## 2015-09-12 NOTE — Telephone Encounter (Signed)
Opened up for review.

## 2015-09-12 NOTE — Telephone Encounter (Signed)
Did pt come in for cbc today 09-11-2014 as I had asked her to.

## 2015-09-12 NOTE — Telephone Encounter (Signed)
Pt was a no show for repeat cbc on Monday ay. She is very anemic. Would you remind her that I still want repeat cbc done this week. I did talk with our staff and they did schedule her for a later appointment with gyn on different day. Did she get the message.

## 2015-09-13 ENCOUNTER — Telehealth: Payer: Self-pay | Admitting: Oncology

## 2015-09-13 NOTE — Telephone Encounter (Signed)
Left message for pt to call back and set up a lab appointment this week or next week and to check and see if she had received a call from the GYN office. Pt was advised to call back with questions.

## 2015-09-13 NOTE — Telephone Encounter (Signed)
Left message for patient to return call to schedule np [phone]

## 2015-09-15 ENCOUNTER — Other Ambulatory Visit: Payer: BLUE CROSS/BLUE SHIELD

## 2015-09-15 NOTE — Telephone Encounter (Signed)
Mailed letter to pt to call back and schedule a lab appointment.

## 2015-09-19 ENCOUNTER — Other Ambulatory Visit: Payer: BLUE CROSS/BLUE SHIELD

## 2015-09-20 ENCOUNTER — Other Ambulatory Visit: Payer: BLUE CROSS/BLUE SHIELD

## 2015-09-22 ENCOUNTER — Ambulatory Visit: Payer: BLUE CROSS/BLUE SHIELD | Admitting: Oncology

## 2015-09-22 NOTE — Progress Notes (Signed)
No shiow  [address] [facility] [place] [address]

## 2015-09-23 ENCOUNTER — Telehealth: Payer: Self-pay | Admitting: Oncology

## 2015-09-23 NOTE — Telephone Encounter (Signed)
Left message to inform pt of 3/13 appt per 2/16 pof

## 2015-10-04 NOTE — Telephone Encounter (Signed)
Opened to review 

## 2015-10-17 ENCOUNTER — Ambulatory Visit: Payer: BLUE CROSS/BLUE SHIELD | Admitting: Nurse Practitioner

## 2015-10-17 ENCOUNTER — Telehealth: Payer: Self-pay | Admitting: *Deleted

## 2015-10-17 NOTE — Telephone Encounter (Signed)
Called pt to inquire abt missed appt for today. No answer but left a detailed message to pt that she will need to r/s appt. Communicated via VM that I was concerned abt missed appt for today and wants to make sure she is okay, told her to call [phone] and ask to speak to this nurse. Message to be fwd to H.Boelter,NP.

## 2015-11-10 ENCOUNTER — Telehealth: Payer: Self-pay | Admitting: Medical

## 2015-11-10 NOTE — Telephone Encounter (Signed)
Patient dismissed from [facility] by Mackie Pai PA-C , effective November 08, 2015. Dismissal letter sent out by certified / registered mail.  [facility]  Certified dismissal letter returned as undeliverable, unclaimed, return to sender after three attempts by [place]. Letter placed in another envelope and resent as 1st class mail which does not require a signature. Document scanned under media tab 12/12/15 DAJ

## 2015-12-06 ENCOUNTER — Emergency Department (HOSPITAL_COMMUNITY)
Admission: EM | Admit: 2015-12-06 | Discharge: 2015-12-07 | Disposition: A | Discharge status: Home / Self Care | Payer: BLUE CROSS/BLUE SHIELD | Attending: Emergency Medicine | Admitting: Emergency Medicine

## 2015-12-06 ENCOUNTER — Encounter (HOSPITAL_COMMUNITY): Payer: Self-pay | Admitting: Emergency Medicine

## 2015-12-06 ENCOUNTER — Emergency Department (HOSPITAL_COMMUNITY): Payer: BLUE CROSS/BLUE SHIELD

## 2015-12-06 DIAGNOSIS — K219 Gastro-esophageal reflux disease without esophagitis: Secondary | ICD-10-CM | POA: Insufficient documentation

## 2015-12-06 DIAGNOSIS — J4521 Mild intermittent asthma with (acute) exacerbation: Secondary | ICD-10-CM | POA: Diagnosis not present

## 2015-12-06 DIAGNOSIS — R011 Cardiac murmur, unspecified: Secondary | ICD-10-CM | POA: Diagnosis not present

## 2015-12-06 DIAGNOSIS — D649 Anemia, unspecified: Secondary | ICD-10-CM | POA: Diagnosis not present

## 2015-12-06 DIAGNOSIS — Z87440 Personal history of urinary (tract) infections: Secondary | ICD-10-CM | POA: Diagnosis not present

## 2015-12-06 DIAGNOSIS — Z79899 Other long term (current) drug therapy: Secondary | ICD-10-CM | POA: Insufficient documentation

## 2015-12-06 DIAGNOSIS — Z880 Allergy status to penicillin: Secondary | ICD-10-CM | POA: Insufficient documentation

## 2015-12-06 DIAGNOSIS — Z86018 Personal history of other benign neoplasm: Secondary | ICD-10-CM | POA: Diagnosis not present

## 2015-12-06 DIAGNOSIS — R062 Wheezing: Secondary | ICD-10-CM | POA: Diagnosis present

## 2015-12-06 DIAGNOSIS — J4520 Mild intermittent asthma, uncomplicated: Secondary | ICD-10-CM

## 2015-12-06 MED ORDER — ALBUTEROL SULFATE (2.5 MG/3ML) 0.083% IN NEBU
5.0000 mg | INHALATION_SOLUTION | Freq: Once | RESPIRATORY_TRACT | Status: AC
  Administered 2015-12-06: 5 mg via RESPIRATORY_TRACT
  Filled 2015-12-06: qty 6

## 2015-12-06 NOTE — ED Notes (Signed)
Pt states she has shortness of breath and wheezing that started about 8pm tonight

## 2015-12-07 MED ORDER — PREDNISONE 10 MG PO TABS: 20.0000 mg | ORAL_TABLET | Freq: Two times a day (BID) | ORAL | Status: DC

## 2015-12-07 MED ORDER — ALBUTEROL SULFATE HFA 108 (90 BASE) MCG/ACT IN AERS
2.0000 | INHALATION_SPRAY | RESPIRATORY_TRACT | Status: DC | PRN
  Administered 2015-12-07: 2 via RESPIRATORY_TRACT
  Filled 2015-12-07: qty 6.7

## 2015-12-07 NOTE — Discharge Instructions (Signed)
Prednisone as prescribed.  Albuterol inhaler: 2 puffs every four hours as needed for wheezing.   Cough, Adult Coughing is a reflex that clears your throat and your airways. Coughing helps to heal and protect your lungs. It is normal to cough occasionally, but a cough that happens with other symptoms or lasts a long time may be a sign of a condition that needs treatment. A cough may last only 2-3 weeks (acute), or it may last longer than 8 weeks (chronic). CAUSES Coughing is commonly caused by:  Breathing in substances that irritate your lungs.  A viral or bacterial respiratory infection.  Allergies.  Asthma.  Postnasal drip.  Smoking.  Acid backing up from the stomach into the esophagus (gastroesophageal reflux).  Certain medicines.  Chronic lung problems, including COPD (or rarely, lung cancer).  Other medical conditions such as heart failure. HOME CARE INSTRUCTIONS  Pay attention to any changes in your symptoms. Take these actions to help with your discomfort:  Take medicines only as told by your health care provider.  If you were prescribed an antibiotic medicine, take it as told by your health care provider. Do not stop taking the antibiotic even if you start to feel better.  Talk with your health care provider before you take a cough suppressant medicine.  Drink enough fluid to keep your urine clear or pale yellow.  If the air is dry, use a cold steam vaporizer or humidifier in your bedroom or your home to help loosen secretions.  Avoid anything that causes you to cough at work or at home.  If your cough is worse at night, try sleeping in a semi-upright position.  Avoid cigarette smoke. If you smoke, quit smoking. If you need help quitting, ask your health care provider.  Avoid caffeine.  Avoid alcohol.  Rest as needed. SEEK MEDICAL CARE IF:   You have new symptoms.  You cough up pus.  Your cough does not get better after 2-3 weeks, or your cough gets  worse.  You cannot control your cough with suppressant medicines and you are losing sleep.  You develop pain that is getting worse or pain that is not controlled with pain medicines.  You have a fever.  You have unexplained weight loss.  You have night sweats. SEEK IMMEDIATE MEDICAL CARE IF:  You cough up blood.  You have difficulty breathing.  Your heartbeat is very fast.   This information is not intended to replace advice given to you by your health care provider. Make sure you discuss any questions you have with your health care provider.   Document Released: 01/19/2011 Document Revised: 04/13/2015 Document Reviewed: 09/29/2014 Elsevier Interactive Patient Education [employer].

## 2015-12-07 NOTE — ED Notes (Signed)
Patient was alert, oriented and stable upon discharge. RN went over AVS and patient had no further questions.  

## 2015-12-07 NOTE — ED Provider Notes (Signed)
CSN: [id number]     Arrival date & time 12/06/15  2255 History  By signing my name below, I, Karen Lutz, attest that this documentation has been prepared under the direction and in the presence of Karen Speak, MD. Electronically Signed: Eustaquio Lutz, ED Scribe. 12/07/2015. 2:01 AM.   Chief Complaint  Patient presents with  . Wheezing   Patient is a 45 y.o. female presenting with wheezing. The history is provided by the patient. No language interpreter was used.  Wheezing Severity:  Moderate Onset quality:  Sudden Duration:  6 hours Timing:  Constant Progression:  Unchanged Chronicity:  New Relieved by: breathing treatment. Associated symptoms: chest pain, cough and shortness of breath   Associated symptoms: no fever      HPI Comments: Karen Lutz is a 45 y.o. female who presents to the Emergency Department complaining of sudden onset, constant, shortness of breath and wheezing that began last night at 8 PM (approximately 6 hours ago). Pt reports that she felt fine earlier in the day. She also complains of dry cough and chest wall pain with coughing. No hx asthma or seasonal allergies. Pt was given a breathing treatment while in the ED with some relief. Denies fever, chills, or any other associated symptoms. Pt is non smoker. She is not exposed to second hand smoke.    Past Medical History  Diagnosis Date  . Anemia   . UTI (lower urinary tract infection)   . Fibroids   . Acid reflux   . Heart murmur    Past Surgical History  Procedure Laterality Date  . Cesarean section    . Knee surgery    . Ceaserian    . Cesarean section    . Knee surgery     Family History  Problem Relation Age of Onset  . Hypertension Other   . Cancer Other   . Alcohol abuse Mother   . Heart disease Mother   . Alcohol abuse Father   . Heart disease Father   . Arthritis Maternal Grandmother   . Arthritis Maternal Grandfather   . Arthritis Paternal Grandmother   . Arthritis Paternal  Grandfather    Social History  Substance Use Topics  . Smoking status: Never Smoker   . Smokeless tobacco: Never Used  . Alcohol Use: No   OB History    No data available     Review of Systems  Constitutional: Negative for fever.  Respiratory: Positive for cough, shortness of breath and wheezing.   Cardiovascular: Positive for chest pain.  All other systems reviewed and are negative.  Allergies  Penicillins  Home Medications   Prior to Admission medications   Medication Sig Start Date End Date Taking? Authorizing Provider  ibuprofen (ADVIL,MOTRIN) 200 MG tablet Take 400 mg by mouth every 6 (six) hours as needed for moderate pain.   Yes Historical Provider, MD  pantoprazole (PROTONIX) 20 MG tablet Take 1 tablet (20 mg total) by mouth 2 (two) times daily. Patient taking differently: Take 20 mg by mouth daily as needed for heartburn or indigestion.  09/03/15  Yes Quintella Reichert, MD  albuterol (PROVENTIL HFA;VENTOLIN HFA) 108 (90 BASE) MCG/ACT inhaler Inhale 2 puffs into the lungs every 4 (four) hours as needed for wheezing or shortness of breath. Patient not taking: Reported on 12/06/2015 07/27/13   Merryl Hacker, MD  dicyclomine (BENTYL) 20 MG tablet Take 1 tablet (20 mg total) by mouth 2 (two) times daily. Patient not taking: Reported on 12/06/2015 09/04/15  Charlesetta Shanks, MD  iron polysaccharides (NIFEREX) 150 MG capsule Take 1 capsule (150 mg total) by mouth daily. Patient not taking: Reported on 12/06/2015 09/03/15   Quintella Reichert, MD  ondansetron (ZOFRAN) 4 MG tablet Take 1 tablet (4 mg total) by mouth every 6 (six) hours. Patient not taking: Reported on 12/06/2015 09/03/15   Quintella Reichert, MD  sucralfate (CARAFATE) 1 g tablet Take 1 tablet (1 g total) by mouth 4 (four) times daily -  with meals and at bedtime. Patient not taking: Reported on 12/06/2015 09/04/15   Charlesetta Shanks, MD   BP 113/60 mmHg  Pulse 83  Temp(Src) 98 F (36.7 C) (Oral)  Resp 20  SpO2 100%  LMP  11/18/2015 (Exact Date)   Physical Exam  Constitutional: She is oriented to person, place, and time. She appears well-developed and well-nourished. No distress.  HENT:  Head: Normocephalic and atraumatic.  Eyes: EOM are normal.  Neck: Normal range of motion.  Cardiovascular: Normal rate, regular rhythm and normal heart sounds.   Pulmonary/Chest: Effort normal and breath sounds normal. No respiratory distress. She has no wheezes. She has no rales.  Abdominal: Soft. She exhibits no distension. There is no tenderness.  Musculoskeletal: Normal range of motion.  Neurological: She is alert and oriented to person, place, and time.  Skin: Skin is warm and dry.  Psychiatric: She has a normal mood and affect. Judgment normal.  Nursing note and vitals reviewed.   ED Course  Procedures (including critical care time)  DIAGNOSTIC STUDIES: Oxygen Saturation is 100% on RA, normal by my interpretation.    COORDINATION OF CARE: 1:59 AM-Discussed treatment plan which includes albuterol inhaler with pt at bedside and pt agreed to plan.   Labs Review Labs Reviewed - No data to display  Imaging Review Dg Chest 2 View  12/07/2015  CLINICAL DATA:  Wheezing since around 8 o'clock this evening. EXAM: CHEST  2 VIEW COMPARISON:  07/27/2013; 04/02/2011 FINDINGS: Grossly unchanged cardiac silhouette and mediastinal contours. There is mild diffuse slightly nodular thickening of the pulmonary interstitium. No focal airspace opacities. No pleural effusion or pneumothorax. No evidence of edema. No acute osseus abnormalities. IMPRESSION: Findings suggestive of mild airways disease / bronchitis. No focal airspace opacities to suggest pneumonia. Electronically Signed   By: Sandi Mariscal M.D.   On: 12/07/2015 00:01   I have personally reviewed and evaluated these images and lab results as part of my medical decision-making.   EKG Interpretation None      MDM   Final diagnoses:  None    Patient presents with  complaints of wheezing and shortness of breath. She has a history of asthma. Her chest x-ray today reveals no infiltrate or acute process. She is feeling better after an albuterol treatment. She will be discharged to home and is to follow-up as needed. She will also be treated with prednisone.   I personally performed the services described in this documentation, which was scribed in my presence. The recorded information has been reviewed and is accurate.       Karen Speak, MD 12/07/15 [phone]

## 2015-12-20 ENCOUNTER — Emergency Department (HOSPITAL_COMMUNITY)
Admission: EM | Admit: 2015-12-20 | Discharge: 2015-12-20 | Disposition: A | Discharge status: Home / Self Care | Payer: Self-pay | Attending: Emergency Medicine | Admitting: Emergency Medicine

## 2015-12-20 ENCOUNTER — Encounter (HOSPITAL_COMMUNITY): Payer: Self-pay

## 2015-12-20 ENCOUNTER — Emergency Department (HOSPITAL_COMMUNITY): Payer: Self-pay

## 2015-12-20 DIAGNOSIS — J069 Acute upper respiratory infection, unspecified: Secondary | ICD-10-CM | POA: Insufficient documentation

## 2015-12-20 DIAGNOSIS — R0789 Other chest pain: Secondary | ICD-10-CM | POA: Insufficient documentation

## 2015-12-20 DIAGNOSIS — K219 Gastro-esophageal reflux disease without esophagitis: Secondary | ICD-10-CM | POA: Insufficient documentation

## 2015-12-20 DIAGNOSIS — Z79899 Other long term (current) drug therapy: Secondary | ICD-10-CM | POA: Insufficient documentation

## 2015-12-20 DIAGNOSIS — B9789 Other viral agents as the cause of diseases classified elsewhere: Secondary | ICD-10-CM

## 2015-12-20 DIAGNOSIS — Z7951 Long term (current) use of inhaled steroids: Secondary | ICD-10-CM | POA: Insufficient documentation

## 2015-12-20 LAB — CBC WITH DIFFERENTIAL/PLATELET
BASOS ABS: 0 10*3/uL (ref 0.0–0.1)
Basophils Relative: 0 %
Eosinophils Absolute: 0.1 10*3/uL (ref 0.0–0.7)
Eosinophils Relative: 1 %
HEMATOCRIT: 24.1 % — AB (ref 36.0–46.0)
HEMOGLOBIN: 6.4 g/dL — AB (ref 12.0–15.0)
Lymphocytes Relative: 10 %
Lymphs Abs: 0.6 10*3/uL — ABNORMAL LOW (ref 0.7–4.0)
MCH: 16.4 pg — ABNORMAL LOW (ref 26.0–34.0)
MCHC: 26.6 g/dL — ABNORMAL LOW (ref 30.0–36.0)
MCV: 61.6 fL — ABNORMAL LOW (ref 78.0–100.0)
MONOS PCT: 3 %
Monocytes Absolute: 0.2 10*3/uL (ref 0.1–1.0)
Neutro Abs: 5.1 10*3/uL (ref 1.7–7.7)
Neutrophils Relative %: 86 %
Platelets: 354 10*3/uL (ref 150–400)
RBC: 3.91 MIL/uL (ref 3.87–5.11)
RDW: 21 % — ABNORMAL HIGH (ref 11.5–15.5)
WBC: 6 10*3/uL (ref 4.0–10.5)

## 2015-12-20 LAB — BASIC METABOLIC PANEL
Anion gap: 5 (ref 5–15)
BUN: 9 mg/dL (ref 6–20)
CHLORIDE: 107 mmol/L (ref 101–111)
CO2: 24 mmol/L (ref 22–32)
Calcium: 9 mg/dL (ref 8.9–10.3)
Creatinine, Ser: 0.6 mg/dL (ref 0.44–1.00)
GFR calc Af Amer: >60 mL/min (ref >60)
GFR calc non Af Amer: >60 mL/min (ref >60)
GLUCOSE: 113 mg/dL — AB (ref 65–99)
POTASSIUM: 3.9 mmol/L (ref 3.5–5.1)
Sodium: 136 mmol/L (ref 135–145)

## 2015-12-20 LAB — RAPID STREP SCREEN (MED CTR MEBANE ONLY): Streptococcus, Group A Screen (Direct): NEGATIVE

## 2015-12-20 LAB — I-STAT TROPONIN, ED: Troponin i, poc: 0 ng/mL (ref 0.00–0.08)

## 2015-12-20 LAB — I-STAT BETA HCG BLOOD, ED (MC, WL, AP ONLY)

## 2015-12-20 MED ORDER — ACETAMINOPHEN 500 MG PO TABS
1000.0000 mg | ORAL_TABLET | Freq: Once | ORAL | Status: AC
  Administered 2015-12-20: 1000 mg via ORAL
  Filled 2015-12-20: qty 2

## 2015-12-20 MED ORDER — BENZONATATE 100 MG PO CAPS: 100.0000 mg | ORAL_CAPSULE | Freq: Three times a day (TID) | ORAL | Status: DC | PRN

## 2015-12-20 MED ORDER — IPRATROPIUM-ALBUTEROL 0.5-2.5 (3) MG/3ML IN SOLN
3.0000 mL | Freq: Once | RESPIRATORY_TRACT | Status: AC
  Administered 2015-12-20: 3 mL via RESPIRATORY_TRACT
  Filled 2015-12-20: qty 3

## 2015-12-20 MED ORDER — DEXAMETHASONE 4 MG PO TABS
10.0000 mg | ORAL_TABLET | Freq: Once | ORAL | Status: AC
  Administered 2015-12-20: 10 mg via ORAL
  Filled 2015-12-20: qty 2

## 2015-12-20 NOTE — ED Notes (Signed)
Pt complains of a sore throat, generalized body aches and a cough since yesterday

## 2015-12-20 NOTE — ED Provider Notes (Signed)
CSN: [id number]     Arrival date & time 12/20/15  [id number] History   First MD Initiated Contact with Patient 12/20/15 [phone]     Chief Complaint  Patient presents with  . Cough  . Sore Throat     (Consider location/radiation/quality/duration/timing/severity/associated sxs/prior Treatment) HPI Comments: 45 year old female who presents with cough, sore throat, and body aches. Yesterday the patient began having a sore throat as well as generalized body aches and cough productive of thick green phlegm. She endorses chills but has not measured her temperature. Mild diarrhea but no vomiting. No chest pain. She denies any sick contacts or recent travel. She has taken cough syrup for her symptoms.  Patient is a 44 y.o. female presenting with cough and pharyngitis. The history is provided by the patient.  Cough Sore Throat    Past Medical History  Diagnosis Date  . Anemia   . UTI (lower urinary tract infection)   . Fibroids   . Acid reflux   . Heart murmur    Past Surgical History  Procedure Laterality Date  . Cesarean section    . Knee surgery    . Ceaserian    . Cesarean section    . Knee surgery     Family History  Problem Relation Age of Onset  . Hypertension Other   . Cancer Other   . Alcohol abuse Mother   . Heart disease Mother   . Alcohol abuse Father   . Heart disease Father   . Arthritis Maternal Grandmother   . Arthritis Maternal Grandfather   . Arthritis Paternal Grandmother   . Arthritis Paternal Grandfather    Social History  Substance Use Topics  . Smoking status: Never Smoker   . Smokeless tobacco: Never Used  . Alcohol Use: No   OB History    No data available     Review of Systems  Respiratory: Positive for cough.    10 Systems reviewed and are negative for acute change except as noted in the HPI.    Allergies  Penicillins  Home Medications   Prior to Admission medications   Medication Sig Start Date End Date Taking? Authorizing Provider   guaifenesin (ROBITUSSIN) 100 MG/5ML syrup Take 200 mg by mouth 3 (three) times daily as needed for cough.   Yes Historical Provider, MD  pantoprazole (PROTONIX) 20 MG tablet Take 1 tablet (20 mg total) by mouth 2 (two) times daily. Patient taking differently: Take 20 mg by mouth daily as needed for heartburn or indigestion.  09/03/15  Yes Quintella Reichert, MD  albuterol (PROVENTIL HFA;VENTOLIN HFA) 108 (90 BASE) MCG/ACT inhaler Inhale 2 puffs into the lungs every 4 (four) hours as needed for wheezing or shortness of breath. Patient not taking: Reported on 12/06/2015 07/27/13   Merryl Hacker, MD  benzonatate (TESSALON) 100 MG capsule Take 1 capsule (100 mg total) by mouth 3 (three) times daily as needed for cough. 12/20/15   Wenda Overland Little, MD   BP 112/52 mmHg  Pulse 76  Temp(Src) 98.9 F (37.2 C) (Oral)  Resp 20  Ht 5\' 6"  (1.676 m)  Wt 250 lb (113.399 kg)  BMI 40.37 kg/m2  SpO2 96%  LMP 12/18/2015 Physical Exam  Constitutional: She is oriented to person, place, and time. She appears well-developed and well-nourished. No distress.  HENT:  Head: Normocephalic and atraumatic.  Mouth/Throat: Oropharynx is clear and moist.  Moist mucous membranes  Eyes: Conjunctivae are normal. Pupils are equal, round, and reactive to light.  Neck:  Neck supple.  Cardiovascular: Normal rate, regular rhythm and normal heart sounds.   No murmur heard. Pulmonary/Chest:  Very mildly increased WOB with some scattered faint wheezes b/l, some upper airway wheezing that seems effort-dependent  Abdominal: Soft. Bowel sounds are normal. She exhibits no distension. There is no tenderness.  Musculoskeletal: She exhibits edema.  Trace b/l LE edema  Neurological: She is alert and oriented to person, place, and time.  Fluent speech  Skin: Skin is warm and dry. No rash noted.  Psychiatric: She has a normal mood and affect. Judgment normal.  Nursing note and vitals reviewed.   ED Course  Procedures (including  critical care time) Labs Review Labs Reviewed  BASIC METABOLIC PANEL - Abnormal; Notable for the following:    Glucose, Bld 113 (*)    All other components within normal limits  CBC WITH DIFFERENTIAL/PLATELET - Abnormal; Notable for the following:    Hemoglobin 6.4 (*)    HCT 24.1 (*)    MCV 61.6 (*)    MCH 16.4 (*)    MCHC 26.6 (*)    RDW 21.0 (*)    Lymphs Abs 0.6 (*)    All other components within normal limits  RAPID STREP SCREEN (NOT AT [facility])  CULTURE, GROUP A STREP ([place])  I-STAT TROPOININ, ED  I-STAT BETA HCG BLOOD, ED ([facility], [facility], [facility] ONLY)    Imaging Review Dg Chest 2 View  12/20/2015  CLINICAL DATA:  Cough, congestion, shortness of breath and wheezing beginning last night. Initial encounter. EXAM: CHEST  2 VIEW COMPARISON:  PA and lateral chest 12/06/2015 and 07/27/2013. FINDINGS: The lungs are clear. Heart size is normal. There is no pneumothorax or pleural effusion. No focal bony abnormality. IMPRESSION: Negative chest. Electronically Signed   By: Inge Rise M.D.   On: 12/20/2015 07:10   I have personally reviewed and evaluated these  lab results as part of my medical decision-making.  Medications  ipratropium-albuterol (DUONEB) 0.5-2.5 (3) MG/3ML nebulizer solution 3 mL (3 mLs Nebulization Given 12/20/15 0726)  dexamethasone (DECADRON) tablet 10 mg (10 mg Oral Given 12/20/15 0725)  acetaminophen (TYLENOL) tablet 1,000 mg (1,000 mg Oral Given 12/20/15 0726)     MDM   Final diagnoses:  Viral upper respiratory tract infection with cough  Burning chest pain   Pt w/ 1 day of productive cough, chills, sore throat, and body aches. She was non-toxic, NAD on exam. VS notable for HR 104, O2 100% on RA. She had occasional faint wheezes scattered b/l and was also producing some upper airway wheezing which appeared voluntary and effort-dependent. CXR negative. After duoneb, pt later complained of burning chest pain/pressure that had already resolved by the time I evaluated her.  Obtained labs which showed negative troponin and HCG. She has severe anemia which is chronic and she has already arranged f/u with hematology. Emphasized importance of this follow up as well as compliance w/ iron supplementation.Given her young age and reassuring EKG and labs, I doubt ACS. No recent travel, OCP use, or leg swelling to suggest PE.   Sx c/w viral process. Gave duoneb and decadron and discussed supportive care including OTC medications, fluids, and albuterol as needed at home which patient already has. Return precautions reviewed and pt discharged in satisfactory condition.  Sharlett Iles, MD 12/20/15 1224

## 2015-12-20 NOTE — ED Notes (Signed)
MD at bedside. 

## 2015-12-20 NOTE — ED Notes (Signed)
Discharge instructions, follow up care, and rx x1 reviewed with patient. Patient verbalized understanding. 

## 2015-12-20 NOTE — ED Notes (Signed)
Bed: WA03 Expected date:  Expected time:  Means of arrival:  Comments: 

## 2015-12-22 LAB — CULTURE, GROUP A STREP (THRC)

## 2016-05-07 ENCOUNTER — Emergency Department (HOSPITAL_COMMUNITY)
Admission: EM | Admit: 2016-05-07 | Discharge: 2016-05-07 | Disposition: A | Discharge status: Home / Self Care | Payer: Self-pay | Attending: Emergency Medicine | Admitting: Emergency Medicine

## 2016-05-07 ENCOUNTER — Encounter (HOSPITAL_COMMUNITY): Payer: Self-pay | Admitting: Emergency Medicine

## 2016-05-07 DIAGNOSIS — M79602 Pain in left arm: Secondary | ICD-10-CM | POA: Insufficient documentation

## 2016-05-07 DIAGNOSIS — R110 Nausea: Secondary | ICD-10-CM | POA: Insufficient documentation

## 2016-05-07 DIAGNOSIS — E876 Hypokalemia: Secondary | ICD-10-CM | POA: Insufficient documentation

## 2016-05-07 DIAGNOSIS — R51 Headache: Secondary | ICD-10-CM | POA: Insufficient documentation

## 2016-05-07 DIAGNOSIS — D509 Iron deficiency anemia, unspecified: Secondary | ICD-10-CM | POA: Insufficient documentation

## 2016-05-07 DIAGNOSIS — R197 Diarrhea, unspecified: Secondary | ICD-10-CM | POA: Insufficient documentation

## 2016-05-07 LAB — URINALYSIS, ROUTINE W REFLEX MICROSCOPIC
Bilirubin Urine: NEGATIVE
Glucose, UA: NEGATIVE mg/dL
Hgb urine dipstick: NEGATIVE
Ketones, ur: NEGATIVE mg/dL
LEUKOCYTES UA: NEGATIVE
NITRITE: NEGATIVE
PH: 5.5 (ref 5.0–8.0)
Protein, ur: NEGATIVE mg/dL
SPECIFIC GRAVITY, URINE: 1.034 — AB (ref 1.005–1.030)

## 2016-05-07 LAB — COMPREHENSIVE METABOLIC PANEL
ALBUMIN: 3.7 g/dL (ref 3.5–5.0)
ALK PHOS: 69 U/L (ref 38–126)
ALT: 8 U/L — ABNORMAL LOW (ref 14–54)
ANION GAP: 9 (ref 5–15)
AST: 15 U/L (ref 15–41)
BILIRUBIN TOTAL: 0.6 mg/dL (ref 0.3–1.2)
BUN: 9 mg/dL (ref 6–20)
CALCIUM: 9.3 mg/dL (ref 8.9–10.3)
CO2: 24 mmol/L (ref 22–32)
Chloride: 105 mmol/L (ref 101–111)
Creatinine, Ser: 0.71 mg/dL (ref 0.44–1.00)
GFR calc non Af Amer: >60 mL/min (ref >60)
GLUCOSE: 70 mg/dL (ref 65–99)
POTASSIUM: 3.2 mmol/L — AB (ref 3.5–5.1)
SODIUM: 138 mmol/L (ref 135–145)
TOTAL PROTEIN: 7.4 g/dL (ref 6.5–8.1)

## 2016-05-07 LAB — CBC
HEMATOCRIT: 27 % — AB (ref 36.0–46.0)
HEMOGLOBIN: 7.1 g/dL — AB (ref 12.0–15.0)
MCH: 16.2 pg — ABNORMAL LOW (ref 26.0–34.0)
MCHC: 26.3 g/dL — AB (ref 30.0–36.0)
MCV: 61.8 fL — ABNORMAL LOW (ref 78.0–100.0)
Platelets: 384 10*3/uL (ref 150–400)
RBC: 4.37 MIL/uL (ref 3.87–5.11)
RDW: 20.7 % — ABNORMAL HIGH (ref 11.5–15.5)
WBC: 4.8 10*3/uL (ref 4.0–10.5)

## 2016-05-07 LAB — LIPASE, BLOOD: Lipase: 30 U/L (ref 11–51)

## 2016-05-07 MED ORDER — POTASSIUM CHLORIDE CRYS ER 20 MEQ PO TBCR
40.0000 meq | EXTENDED_RELEASE_TABLET | Freq: Once | ORAL | Status: AC
  Administered 2016-05-07: 40 meq via ORAL
  Filled 2016-05-07: qty 2

## 2016-05-07 MED ORDER — ACETAMINOPHEN 500 MG PO TABS
1000.0000 mg | ORAL_TABLET | Freq: Once | ORAL | Status: AC
  Administered 2016-05-07: 1000 mg via ORAL
  Filled 2016-05-07: qty 2

## 2016-05-07 NOTE — ED Notes (Signed)
Bed: [facility]PT1 Expected date:  Expected time:  Means of arrival:  Comments: 

## 2016-05-07 NOTE — ED Provider Notes (Signed)
[facility] DEPT Provider Note   CSN: [id number] Arrival date & time: 05/07/16  1510     History   Chief Complaint Chief Complaint  Patient presents with  . Arm Pain    HPI Karen Lutz is a 45 y.o. female.  HPI Complains of left upper arm pain for one month becoming worse over the past 2 days pain is at lateral aspect of upper arm. Worse with moving her arm and improved with holding on still. She's treated herself with ibuprofen, without relief. No fever no injury. Other symptoms include diarrhea for the past 3 days nausea and headache. Denies any fever denies abdominal pain denies chest pain. Last episode of diarrhea was this morning. No hematemesis no blood per rectum Past Medical History:  Diagnosis Date  . Acid reflux   . Anemia   . Fibroids   . Heart murmur   . UTI (lower urinary tract infection)     Patient Active Problem List   Diagnosis Date Noted  . Epigastric pain 07/03/2013  . Anemia 07/03/2013  . Migraine 07/03/2013  . Mood disorder ([place]) 02/15/2011  . Iron deficiency anemia 02/15/2011  . Uterine fibroid 02/15/2011  . GERD (gastroesophageal reflux disease) 02/15/2011    Past Surgical History:  Procedure Laterality Date  . ceaserian    . CESAREAN SECTION    . CESAREAN SECTION    . KNEE SURGERY    . KNEE SURGERY      OB History    No data available       Home Medications    Prior to Admission medications   Medication Sig Start Date End Date Taking? Authorizing Provider  albuterol (PROVENTIL HFA;VENTOLIN HFA) 108 (90 BASE) MCG/ACT inhaler Inhale 2 puffs into the lungs every 4 (four) hours as needed for wheezing or shortness of breath. 07/27/13  Yes Merryl Hacker, MD  guaifenesin (ROBITUSSIN) 100 MG/5ML syrup Take 200 mg by mouth 3 (three) times daily as needed for cough.   Yes Historical Provider, MD  pantoprazole (PROTONIX) 20 MG tablet Take 1 tablet (20 mg total) by mouth 2 (two) times daily. Patient taking differently: Take 20  mg by mouth daily as needed for heartburn or indigestion.  09/03/15  Yes Quintella Reichert, MD  benzonatate (TESSALON) 100 MG capsule Take 1 capsule (100 mg total) by mouth 3 (three) times daily as needed for cough. Patient not taking: Reported on 05/07/2016 12/20/15   Sharlett Iles, MD    Family History Family History  Problem Relation Age of Onset  . Hypertension Other   . Cancer Other   . Alcohol abuse Mother   . Heart disease Mother   . Alcohol abuse Father   . Heart disease Father   . Arthritis Maternal Grandmother   . Arthritis Maternal Grandfather   . Arthritis Paternal Grandmother   . Arthritis Paternal Grandfather     Social History Social History  Substance Use Topics  . Smoking status: Never Smoker  . Smokeless tobacco: Never Used  . Alcohol use No     Allergies   Penicillins   Review of Systems Review of Systems  Constitutional: Negative.   Respiratory: Negative.   Cardiovascular: Negative.   Gastrointestinal: Positive for diarrhea and nausea.  Musculoskeletal: Positive for myalgias.       Left arm pain  Skin: Negative.   Neurological: Positive for headaches.  Psychiatric/Behavioral: Negative.   All other systems reviewed and are negative.    Physical Exam Updated Vital Signs BP 120/68 (  BP Location: Right Arm)   Pulse 106   Temp 98.1 F (36.7 C)   Resp 18   Ht 5\' 6"  (1.676 m)   LMP 04/27/2016   SpO2 95%   Physical Exam  Constitutional: She appears well-developed and well-nourished. No distress.  HENT:  Head: Normocephalic and atraumatic.  Eyes: Conjunctivae are normal. Pupils are equal, round, and reactive to light.  Neck: Neck supple. No tracheal deviation present. No thyromegaly present.  Cardiovascular: Normal rate, regular rhythm and normal heart sounds.   No murmur heard. Pulse counted at 88 bpm by me  Pulmonary/Chest: Effort normal and breath sounds normal.  Abdominal: Soft. Bowel sounds are normal. She exhibits no distension.  There is no tenderness.  Musculoskeletal: Normal range of motion. She exhibits no edema or tenderness.  Left upper extremity no redness no swelling she is tender along the lateral aspect of the upper. Radial pulse 2+. She has pain at upper arm on active motion of the arm. All other extremities without redness swelling or tenderness neurovascularly intact  Neurological: She is alert. Coordination normal.  Skin: Skin is warm and dry. No rash noted.  Psychiatric: She has a normal mood and affect.  Nursing note and vitals reviewed.    ED Treatments / Results  Labs (all labs ordered are listed, but only abnormal results are displayed) Labs Reviewed  COMPREHENSIVE METABOLIC PANEL - Abnormal; Notable for the following:       Result Value   Potassium 3.2 (*)    ALT 8 (*)    All other components within normal limits  CBC - Abnormal; Notable for the following:    Hemoglobin 7.1 (*)    HCT 27.0 (*)    MCV 61.8 (*)    MCH 16.2 (*)    MCHC 26.3 (*)    RDW 20.7 (*)    All other components within normal limits  URINALYSIS, ROUTINE W REFLEX MICROSCOPIC (NOT AT [facility]) - Abnormal; Notable for the following:    Color, Urine AMBER (*)    APPearance CLOUDY (*)    Specific Gravity, Urine 1.034 (*)    All other components within normal limits  LIPASE, BLOOD   Results for orders placed or performed during the hospital encounter of 05/07/16  Lipase, blood  Result Value Ref Range   Lipase 30 11 - 51 U/L  Comprehensive metabolic panel  Result Value Ref Range   Sodium 138 135 - 145 mmol/L   Potassium 3.2 (L) 3.5 - 5.1 mmol/L   Chloride 105 101 - 111 mmol/L   CO2 24 22 - 32 mmol/L   Glucose, Bld 70 65 - 99 mg/dL   BUN 9 6 - 20 mg/dL   Creatinine, Ser 0.71 0.44 - 1.00 mg/dL   Calcium 9.3 8.9 - 10.3 mg/dL   Total Protein 7.4 6.5 - 8.1 g/dL   Albumin 3.7 3.5 - 5.0 g/dL   AST 15 15 - 41 U/L   ALT 8 (L) 14 - 54 U/L   Alkaline Phosphatase 69 38 - 126 U/L   Total Bilirubin 0.6 0.3 - 1.2 mg/dL   GFR  calc non Af Amer >60 >60 mL/min   GFR calc Af Amer >60 >60 mL/min   Anion gap 9 5 - 15  CBC  Result Value Ref Range   WBC 4.8 4.0 - 10.5 K/uL   RBC 4.37 3.87 - 5.11 MIL/uL   Hemoglobin 7.1 (L) 12.0 - 15.0 g/dL   HCT 27.0 (L) 36.0 - 46.0 %  MCV 61.8 (L) 78.0 - 100.0 fL   MCH 16.2 (L) 26.0 - 34.0 pg   MCHC 26.3 (L) 30.0 - 36.0 g/dL   RDW 20.7 (H) 11.5 - 15.5 %   Platelets 384 150 - 400 K/uL  Urinalysis, Routine w reflex microscopic  Result Value Ref Range   Color, Urine AMBER (A) YELLOW   APPearance CLOUDY (A) CLEAR   Specific Gravity, Urine 1.034 (H) 1.005 - 1.030   pH 5.5 5.0 - 8.0   Glucose, UA NEGATIVE NEGATIVE mg/dL   Hgb urine dipstick NEGATIVE NEGATIVE   Bilirubin Urine NEGATIVE NEGATIVE   Ketones, ur NEGATIVE NEGATIVE mg/dL   Protein, ur NEGATIVE NEGATIVE mg/dL   Nitrite NEGATIVE NEGATIVE   Leukocytes, UA NEGATIVE NEGATIVE   No results found. EKG  EKG Interpretation None       Radiology No results found.  Procedures Procedures (including critical care time)  Medications Ordered in ED Medications  potassium chloride SA (K-DUR,KLOR-CON) CR tablet 40 mEq (not administered)  acetaminophen (TYLENOL) tablet 1,000 mg (not administered)     Initial Impression / Assessment and Plan / ED Course  I have reviewed the triage vital signs and the nursing notes.  Pertinent labs & imaging results that were available during my care of the patient were reviewed by me and considered in my medical decision making (see chart for details).  Clinical Course    Treated with Tylenol ice pack supplemental oral potassium here . I wanted to write patient prescription for iron supplement however she did not wait for instructions. I did consult case management who provided her with resources to get a primary care physician. Left arm pain is felt to be muscular in etiology. Strongly doubt vascular compromise or infection or DVT based on location of pain in fact the pain is worse with  movement. Patient has bounding pulses and no evidence of infection clinically. Anemia is chronic  Final Clinical Impressions(s) / ED Diagnoses  Diagnoses #1 left arm pain #2 hypokalemia #3 microcytic anemia #4 nonspecific headache #5 nausea and diarrhea Final diagnoses:  None    New Prescriptions New Prescriptions   No medications on file     Orlie Dakin, MD 05/07/16 1829

## 2016-05-07 NOTE — ED Triage Notes (Addendum)
Pt complaint of left arm pain and numbness for a month worse with movement. Pt denies injury. Pt denies chest pain. Bilateral extremity grips strong and equal. Pt reports associated headache, nausea, and diarrhea; no abdominal pain.

## 2016-05-07 NOTE — ED Notes (Signed)
Campos MD aware of pt status and complaint. Campos MD verbalizes pt DOES NOT need EKG and gives okay to protocol currently placed.

## 2016-05-07 NOTE — Progress Notes (Signed)
EDCM spoke to patient at bedside. Patient confirms she does not have a pcp or insurance living in [place].  [facility] provided patient with contact information to [facility], informed patient of services there and walk in times.  EDCM also provided patient with list of pcps who accept self pay patients, list of discount pharmacies and websites needymeds.org and GoodRX.com for medication assistance, phone number to inquire about the orange card, phone number to inquire about [employer], phone number to inquire about the [place], financial resources in the community such as local churches, [employer], urban ministries, and dental assistance for uninsured patients.  Patient thankful for resources.  No further EDCM needs at this time.  EDCM received patient's permission to email [facility] in efforts to obtain an appointment.  Patient's phone number [phone].

## 2016-05-09 ENCOUNTER — Telehealth: Payer: Self-pay

## 2016-05-09 NOTE — Telephone Encounter (Signed)
Call received from the patient returning this CM call. The patient stated that she continues to have left arm pain and occasional numbness of the extremity and it is not getting any better.   She was seen in the ED for left arm pain  on 05/07/16 and stated that she was told in the ED that it was muscular pain but she thinks that it's " more than muscular."   She denied any chest pain or pain radiating from the left extremity. She said that she has intermittent nausea, headaches and diarrhea which she had reported to the provider in the ED.   Informed her that the only appointment available at [facility] at this time is on Monday, 05/14/16 in the afternoon; but that is a long wait to be seen by a provider considering her symptoms.  Strongly encouraged her to seek medical attention in the ED for re-evaluation of her symptoms due to risk of possible complications. She stated that she understood and would be re-evaluated in ED.  Stressed to her that it is important to have her symptoms evaluated now and an appointment can be scheduled at [facility] at a later time.   Update provided to A. Christen Bame, RN CM .

## 2016-05-09 NOTE — Telephone Encounter (Signed)
Message received from Livia Snellen, RN CM requesting a hospital follow up appointment at [facility] for the patient.  Call placed to # [phone] (M) and a HIPAA compliant voicemail message was left requesting a call back to # [phone] or [phone].  Update provided to A. Christen Bame, RN CM

## 2016-05-13 ENCOUNTER — Emergency Department (HOSPITAL_BASED_OUTPATIENT_CLINIC_OR_DEPARTMENT_OTHER)
Admission: EM | Admit: 2016-05-13 | Discharge: 2016-05-13 | Disposition: A | Discharge status: Home / Self Care | Payer: Self-pay | Attending: Emergency Medicine | Admitting: Emergency Medicine

## 2016-05-13 ENCOUNTER — Encounter (HOSPITAL_BASED_OUTPATIENT_CLINIC_OR_DEPARTMENT_OTHER): Payer: Self-pay | Admitting: Emergency Medicine

## 2016-05-13 ENCOUNTER — Emergency Department (HOSPITAL_BASED_OUTPATIENT_CLINIC_OR_DEPARTMENT_OTHER): Payer: Self-pay

## 2016-05-13 DIAGNOSIS — M79602 Pain in left arm: Secondary | ICD-10-CM | POA: Insufficient documentation

## 2016-05-13 DIAGNOSIS — R197 Diarrhea, unspecified: Secondary | ICD-10-CM | POA: Insufficient documentation

## 2016-05-13 DIAGNOSIS — N390 Urinary tract infection, site not specified: Secondary | ICD-10-CM | POA: Insufficient documentation

## 2016-05-13 DIAGNOSIS — D649 Anemia, unspecified: Secondary | ICD-10-CM | POA: Insufficient documentation

## 2016-05-13 LAB — CBC
HCT: 25 % — ABNORMAL LOW (ref 36.0–46.0)
HEMOGLOBIN: 6.7 g/dL — AB (ref 12.0–15.0)
MCH: 16.5 pg — AB (ref 26.0–34.0)
MCHC: 26.8 g/dL — ABNORMAL LOW (ref 30.0–36.0)
MCV: 61.7 fL — AB (ref 78.0–100.0)
PLATELETS: 267 10*3/uL (ref 150–400)
RBC: 4.05 MIL/uL (ref 3.87–5.11)
RDW: 21 % — ABNORMAL HIGH (ref 11.5–15.5)
WBC: 4.7 10*3/uL (ref 4.0–10.5)

## 2016-05-13 LAB — COMPREHENSIVE METABOLIC PANEL
ALT: 7 U/L — ABNORMAL LOW (ref 14–54)
ANION GAP: 7 (ref 5–15)
AST: 13 U/L — ABNORMAL LOW (ref 15–41)
Albumin: 3.6 g/dL (ref 3.5–5.0)
Alkaline Phosphatase: 67 U/L (ref 38–126)
BUN: 12 mg/dL (ref 6–20)
CHLORIDE: 105 mmol/L (ref 101–111)
CO2: 25 mmol/L (ref 22–32)
Calcium: 9.1 mg/dL (ref 8.9–10.3)
Creatinine, Ser: 0.66 mg/dL (ref 0.44–1.00)
Glucose, Bld: 79 mg/dL (ref 65–99)
Potassium: 3.5 mmol/L (ref 3.5–5.1)
SODIUM: 137 mmol/L (ref 135–145)
Total Bilirubin: 0.4 mg/dL (ref 0.3–1.2)
Total Protein: 7.4 g/dL (ref 6.5–8.1)

## 2016-05-13 LAB — URINALYSIS, ROUTINE W REFLEX MICROSCOPIC
Bilirubin Urine: NEGATIVE
GLUCOSE, UA: NEGATIVE mg/dL
HGB URINE DIPSTICK: NEGATIVE
Ketones, ur: NEGATIVE mg/dL
Nitrite: POSITIVE — AB
PROTEIN: 30 mg/dL — AB
SPECIFIC GRAVITY, URINE: 1.026 (ref 1.005–1.030)
pH: 7 (ref 5.0–8.0)

## 2016-05-13 LAB — URINE MICROSCOPIC-ADD ON: RBC / HPF: NONE SEEN RBC/hpf (ref 0–5)

## 2016-05-13 LAB — PREGNANCY, URINE: Preg Test, Ur: NEGATIVE

## 2016-05-13 LAB — TROPONIN I

## 2016-05-13 LAB — LIPASE, BLOOD: Lipase: 28 U/L (ref 11–51)

## 2016-05-13 MED ORDER — CEPHALEXIN 500 MG PO CAPS: 500.0000 mg | ORAL_CAPSULE | Freq: Four times a day (QID) | ORAL | 0 refills | Status: DC

## 2016-05-13 NOTE — Discharge Instructions (Signed)
Take the antibiotic for the UTI.   Follow up with a primary care doctor about your anemia.

## 2016-05-13 NOTE — ED Provider Notes (Signed)
[facility] DEPT [facility] Provider Note   CSN: [id number] Arrival date & time: 05/13/16  1153     History   Chief Complaint Chief Complaint  Patient presents with  . Arm Pain  . Emesis  . Diarrhea    HPI Karen Lutz is a 45 y.o. female.  HPI Pt has had multiple symptom for 3-4 weeks.  It first started with arm pain.  It hurts to lift with her arm.  The pain goes from the armpit down.  It feels numb.  No trouble with he speech or legs.  She also has been having some diarrhea and abdominal pain.  She can still eat. She does get nauseated though.    Pt was seen in the ED about 1 week ago.  Referred to a clinic.  She told them she was still having symptoms so she was told to go back to the ED. Past Medical History:  Diagnosis Date  . Acid reflux   . Anemia   . Fibroids   . Heart murmur   . UTI (lower urinary tract infection)     Patient Active Problem List   Diagnosis Date Noted  . Epigastric pain 07/03/2013  . Anemia 07/03/2013  . Migraine 07/03/2013  . Mood disorder ([place]) 02/15/2011  . Iron deficiency anemia 02/15/2011  . Uterine fibroid 02/15/2011  . GERD (gastroesophageal reflux disease) 02/15/2011    Past Surgical History:  Procedure Laterality Date  . ceaserian    . CESAREAN SECTION    . CESAREAN SECTION    . KNEE SURGERY    . KNEE SURGERY      OB History    No data available       Home Medications    Prior to Admission medications   Medication Sig Start Date End Date Taking? Authorizing Provider  albuterol (PROVENTIL HFA;VENTOLIN HFA) 108 (90 BASE) MCG/ACT inhaler Inhale 2 puffs into the lungs every 4 (four) hours as needed for wheezing or shortness of breath. 07/27/13   Merryl Hacker, MD  benzonatate (TESSALON) 100 MG capsule Take 1 capsule (100 mg total) by mouth 3 (three) times daily as needed for cough. Patient not taking: Reported on 05/07/2016 12/20/15   Sharlett Iles, MD  guaifenesin (ROBITUSSIN) 100 MG/5ML syrup Take 200  mg by mouth 3 (three) times daily as needed for cough.    Historical Provider, MD  pantoprazole (PROTONIX) 20 MG tablet Take 1 tablet (20 mg total) by mouth 2 (two) times daily. Patient taking differently: Take 20 mg by mouth daily as needed for heartburn or indigestion.  09/03/15   Quintella Reichert, MD    Family History Family History  Problem Relation Age of Onset  . Hypertension Other   . Cancer Other   . Alcohol abuse Mother   . Heart disease Mother   . Alcohol abuse Father   . Heart disease Father   . Arthritis Maternal Grandmother   . Arthritis Maternal Grandfather   . Arthritis Paternal Grandmother   . Arthritis Paternal Grandfather     Social History Social History  Substance Use Topics  . Smoking status: Never Smoker  . Smokeless tobacco: Never Used  . Alcohol use No     Allergies   Penicillins   Review of Systems Review of Systems  Constitutional: Negative for fever.  Eyes:       Vision is sometimes blurry when trying to read   Respiratory: Negative for cough and shortness of breath.   Gastrointestinal: Positive for  abdominal pain, diarrhea and nausea. Negative for vomiting.  Genitourinary: Negative for dysuria.  Musculoskeletal: Positive for back pain.  Skin: Negative for rash.  Neurological: Negative for weakness.       Left arm hurts down from arm pit      Physical Exam Updated Vital Signs BP 103/63   Pulse 81   Temp 98.5 F (36.9 C) (Oral)   Resp 19   Ht 5\' 6"  (1.676 m)   Wt 112.5 kg   LMP 04/27/2016   SpO2 99%   BMI 40.03 kg/m   Physical Exam  Constitutional: She appears well-developed and well-nourished. No distress.  HENT:  Head: Normocephalic and atraumatic.  Right Ear: External ear normal.  Left Ear: External ear normal.  Eyes: Conjunctivae are normal. Right eye exhibits no discharge. Left eye exhibits no discharge. No scleral icterus.  Neck: Neck supple. No tracheal deviation present.  Cardiovascular: Normal rate, regular rhythm  and intact distal pulses.   Pulmonary/Chest: Effort normal and breath sounds normal. No stridor. No respiratory distress. She has no wheezes. She has no rales.  Abdominal: Soft. Bowel sounds are normal. She exhibits no distension. There is no tenderness. There is no rebound and no guarding.  Musculoskeletal: She exhibits no edema.       Left upper arm: She exhibits tenderness. She exhibits no swelling, no edema and no deformity.  Normal radial pulse bilaterally  Neurological: She is alert. She has normal strength. No cranial nerve deficit (no facial droop, extraocular movements intact, no slurred speech) or sensory deficit. She exhibits normal muscle tone. She displays no seizure activity. Coordination normal.  Skin: Skin is warm and dry. No rash noted.  Psychiatric: She has a normal mood and affect.  Nursing note and vitals reviewed.    ED Treatments / Results  Labs (all labs ordered are listed, but only abnormal results are displayed) Labs Reviewed  COMPREHENSIVE METABOLIC PANEL - Abnormal; Notable for the following:       Result Value   AST 13 (*)    ALT 7 (*)    All other components within normal limits  CBC - Abnormal; Notable for the following:    Hemoglobin 6.7 (*)    HCT 25.0 (*)    MCV 61.7 (*)    MCH 16.5 (*)    MCHC 26.8 (*)    RDW 21.0 (*)    All other components within normal limits  URINALYSIS, ROUTINE W REFLEX MICROSCOPIC (NOT AT [facility]) - Abnormal; Notable for the following:    Color, Urine AMBER (*)    APPearance CLOUDY (*)    Protein, ur 30 (*)    Nitrite POSITIVE (*)    Leukocytes, UA MODERATE (*)    All other components within normal limits  URINE MICROSCOPIC-ADD ON - Abnormal; Notable for the following:    Squamous Epithelial / LPF 0-5 (*)    Bacteria, UA MANY (*)    All other components within normal limits  LIPASE, BLOOD  TROPONIN I  PREGNANCY, URINE    EKG  EKG Interpretation  Date/Time:  Sunday May 13 2016 14:19:39 EDT Ventricular Rate:   80 PR Interval:    QRS Duration: 90 QT Interval:  379 QTC Calculation: 438 R Axis:   -23 Text Interpretation:  Sinus rhythm Borderline left axis deviation Anterior infarct, old No significant change since last tracing Confirmed by Marquelle Musgrave  MD-J, Lysha Schrade [id number]) on 05/13/2016 2:30:14 PM       Radiology Dg Chest 2 View  Result Date:  05/13/2016 CLINICAL DATA:  Left upper extremity pain and nausea EXAM: CHEST  2 VIEW COMPARISON:  Dec 20, 2015 FINDINGS: There is no edema or consolidation. The heart size and pulmonary vascularity are normal. No adenopathy. No pneumothorax. No bone lesions. IMPRESSION: No edema or consolidation. Electronically Signed   By: Lowella Grip III M.D.   On: 05/13/2016 14:50    Procedures Procedures (including critical care time)  Medications Ordered in ED Medications - No data to display   Initial Impression / Assessment and Plan / ED Course  I have reviewed the triage vital signs and the nursing notes.  Pertinent labs & imaging results that were available during my care of the patient were reviewed by me and considered in my medical decision making (see chart for details).  Clinical Course  Comment By Time  Previous records reviewed.  Pt has been referred to hematology for her anemia.  Several missed appointments documented.   Anemia is chronic.  UA is otherwise normal.  Tropin is negative.  U preg negative.  Doubt emergency medical condition.  Arm pain seems muscular.  Encourage follow up with a PCP Dorie Rank, MD 10/08 1516   Encouraged pt to follow up with a PCP regarding her anemia.  Pt was told to take iron supplements but she does not want to take them.  Will rx abx for the uti.   Final Clinical Impressions(s) / ED Diagnoses   Final diagnoses:  Chronic anemia  Urinary tract infection without hematuria, site unspecified  Pain of left upper extremity    New Prescriptions New Prescriptions   CEPHALEXIN (KEFLEX) 500 MG CAPSULE    Take 1 capsule (500  mg total) by mouth 4 (four) times daily.     Dorie Rank, MD 05/13/16 [phone]

## 2016-05-13 NOTE — ED Triage Notes (Addendum)
Patient reports nausea x 2 weeks.  Reports pain to left arm x 3-4 weeks but states the pain is getting worse.  Reports she is unable to eat without vomiting.  Reports diarrhea 3-4 x daily. Patient seen previously 6 days ago for same.  Patient also reports feeling lightheaded.  Patient states "My breasts feel tender.  I just feel like everything hurts right now".

## 2016-05-16 ENCOUNTER — Telehealth: Payer: Self-pay

## 2016-05-16 NOTE — Telephone Encounter (Signed)
This Case Manager received communication from Livia Snellen, RN CM that patient needing a hospital follow-up appointment. Patient is uninsured and does not have a PCP. Call placed to [phone] to discuss need for a hospital follow-up appointment. Patient agreeable, and appointment scheduled for 05/21/16 at 1200. Informed patient of clinic's address and phone number. Also informed patient of clinic's onsite resources and informed her she may benefit from meeting with onsite Financial Counselor to determine if eligible for the [employer] or [employer]. Patient verbalized understanding.

## 2016-05-21 ENCOUNTER — Inpatient Hospital Stay: Payer: Self-pay

## 2016-08-26 ENCOUNTER — Encounter (HOSPITAL_COMMUNITY): Payer: Self-pay | Admitting: Oncology

## 2016-08-26 DIAGNOSIS — H1089 Other conjunctivitis: Secondary | ICD-10-CM | POA: Insufficient documentation

## 2016-08-26 DIAGNOSIS — Z79899 Other long term (current) drug therapy: Secondary | ICD-10-CM | POA: Insufficient documentation

## 2016-08-26 NOTE — ED Triage Notes (Signed)
Pt c/o right eye swelling and pain that started yesterday.  Pt's right eye is swollen w/ discharge present.  Pt states that she feels like she has something in her eye.  Rates pain 10/10.

## 2016-08-27 ENCOUNTER — Emergency Department (HOSPITAL_COMMUNITY)
Admission: EM | Admit: 2016-08-27 | Discharge: 2016-08-27 | Disposition: A | Discharge status: Home / Self Care | Payer: Self-pay | Attending: Emergency Medicine | Admitting: Emergency Medicine

## 2016-08-27 DIAGNOSIS — H109 Unspecified conjunctivitis: Secondary | ICD-10-CM

## 2016-08-27 MED ORDER — TETRACAINE HCL 0.5 % OP SOLN
2.0000 [drp] | Freq: Once | OPHTHALMIC | Status: AC
  Administered 2016-08-27: 2 [drp] via OPHTHALMIC
  Filled 2016-08-27: qty 4

## 2016-08-27 MED ORDER — FLUORESCEIN SODIUM 0.6 MG OP STRP
1.0000 | ORAL_STRIP | Freq: Once | OPHTHALMIC | Status: AC
  Administered 2016-08-27: 1 via OPHTHALMIC
  Filled 2016-08-27: qty 1

## 2016-08-27 MED ORDER — ERYTHROMYCIN 5 MG/GM OP OINT: 1.0000 "application " | TOPICAL_OINTMENT | Freq: Four times a day (QID) | OPHTHALMIC | 1 refills | Status: DC

## 2016-08-27 NOTE — Discharge Instructions (Signed)
Apply antibiotic eye ointment to your right eye 4 times daily for the next 5 days. You may also take ibuprofen as prescribed over-the-counter and for additional pain relief. I recommend following up with the ophthalmologist listed below in the next 2-3 days of your symptoms are not improved or have worsened.

## 2016-08-27 NOTE — ED Notes (Signed)
Bed: WTR6 Expected date:  Expected time:  Means of arrival:  Comments: 

## 2016-08-27 NOTE — ED Provider Notes (Signed)
[facility] DEPT Provider Note   CSN: [id number] Arrival date & time: 08/26/16  2244     History   Chief Complaint Chief Complaint  Patient presents with  . Eye Pain    HPI Karen Lutz is a 46 y.o. female.  HPI   Patient is a 46 year old female who presents the ED with complaint of right eye pain. Patient reports over the past 2 days she has had worsening eye redness and drainage to her right eye. Endorses associated itching and gritty sensation to her right eye. Patient states she has been using over-the-counter saline eyedrops without relief. Denies fever, loss of vision, facial swelling. Denies use of contacts. Denies any recent injury or trauma to her eye.  Past Medical History:  Diagnosis Date  . Acid reflux   . Anemia   . Fibroids   . Heart murmur   . UTI (lower urinary tract infection)     Patient Active Problem List   Diagnosis Date Noted  . Epigastric pain 07/03/2013  . Anemia 07/03/2013  . Migraine 07/03/2013  . Mood disorder ([place]) 02/15/2011  . Iron deficiency anemia 02/15/2011  . Uterine fibroid 02/15/2011  . GERD (gastroesophageal reflux disease) 02/15/2011    Past Surgical History:  Procedure Laterality Date  . ceaserian    . CESAREAN SECTION    . CESAREAN SECTION    . KNEE SURGERY    . KNEE SURGERY      OB History    No data available       Home Medications    Prior to Admission medications   Medication Sig Start Date End Date Taking? Authorizing Provider  albuterol (PROVENTIL HFA;VENTOLIN HFA) 108 (90 BASE) MCG/ACT inhaler Inhale 2 puffs into the lungs every 4 (four) hours as needed for wheezing or shortness of breath. 07/27/13   Merryl Hacker, MD  benzonatate (TESSALON) 100 MG capsule Take 1 capsule (100 mg total) by mouth 3 (three) times daily as needed for cough. Patient not taking: Reported on 05/07/2016 12/20/15   Sharlett Iles, MD  cephALEXin (KEFLEX) 500 MG capsule Take 1 capsule (500 mg total) by mouth 4  (four) times daily. 05/13/16   Dorie Rank, MD  erythromycin ophthalmic ointment Place 1 application into the right eye 4 (four) times daily. Place 1/2 inch ribbon of ointment in the affected eye 4 times a day for 5 days 08/27/16   Nona Dell, PA-C  guaifenesin (ROBITUSSIN) 100 MG/5ML syrup Take 200 mg by mouth 3 (three) times daily as needed for cough.    Historical Provider, MD  pantoprazole (PROTONIX) 20 MG tablet Take 1 tablet (20 mg total) by mouth 2 (two) times daily. Patient taking differently: Take 20 mg by mouth daily as needed for heartburn or indigestion.  09/03/15   Quintella Reichert, MD    Family History Family History  Problem Relation Age of Onset  . Hypertension Other   . Cancer Other   . Alcohol abuse Mother   . Heart disease Mother   . Alcohol abuse Father   . Heart disease Father   . Arthritis Maternal Grandmother   . Arthritis Maternal Grandfather   . Arthritis Paternal Grandmother   . Arthritis Paternal Grandfather     Social History Social History  Substance Use Topics  . Smoking status: Never Smoker  . Smokeless tobacco: Never Used  . Alcohol use No     Allergies   Penicillins   Review of Systems Review of Systems  Constitutional: Negative  for fever.  HENT: Negative for facial swelling.   Eyes: Positive for pain, discharge, redness and itching. Negative for photophobia and visual disturbance.     Physical Exam Updated Vital Signs BP 114/63 (BP Location: Left Arm)   Pulse 82   Temp 97.9 F (36.6 C) (Oral)   Resp 18   Ht 5\' 6"  (1.676 m)   Wt 109.5 kg   LMP 08/17/2016 (Exact Date)   SpO2 96%   BMI 38.95 kg/m   Physical Exam  Constitutional: She is oriented to person, place, and time. She appears well-developed and well-nourished.  HENT:  Head: Normocephalic and atraumatic.  Eyes: EOM and lids are normal. Pupils are equal, round, and reactive to light. Lids are everted and swept, no foreign bodies found. Right eye exhibits discharge  (purulent). Right eye exhibits no chemosis, no exudate and no hordeolum. No foreign body present in the right eye. Left eye exhibits no discharge. Right conjunctiva is injected. No scleral icterus.  Slit lamp exam:      The right eye shows no corneal abrasion, no corneal flare, no corneal ulcer, no foreign body, no hyphema and no fluorescein uptake.  Neck: Normal range of motion. Neck supple.  Cardiovascular: Normal rate.   Pulmonary/Chest: Effort normal.  Neurological: She is alert and oriented to person, place, and time.  Skin: Skin is warm and dry.  Nursing note and vitals reviewed.    ED Treatments / Results  Labs (all labs ordered are listed, but only abnormal results are displayed) Labs Reviewed - No data to display  EKG  EKG Interpretation None       Radiology No results found.  Procedures Procedures (including critical care time)  Medications Ordered in ED Medications  fluorescein ophthalmic strip 1 strip (1 strip Right Eye Given 08/27/16 0527)  tetracaine (PONTOCAINE) 0.5 % ophthalmic solution 2 drop (2 drops Right Eye Given 08/27/16 [id number])     Initial Impression / Assessment and Plan / ED Course  I have reviewed the triage vital signs and the nursing notes.  Pertinent labs & imaging results that were available during my care of the patient were reviewed by me and considered in my medical decision making (see chart for details).     Patient presentation consistent with bacterial conjunctivitis.  Pt with purulent discharge. No corneal abrasions, entrapment, consensual photophobia, or dendritic staining with fluorescein study.  Presentation non-concerning for iritis, corneal abrasions, or HSV.  Plan to discharge patient home with erythromycin eye ointment.  Personal hygiene and frequent handwashing discussed.  Patient advised to followup with ophthalmologist if symptoms persist or worsen.  Patient verbalizes understanding and is agreeable with discharge.   Final  Clinical Impressions(s) / ED Diagnoses   Final diagnoses:  Bacterial conjunctivitis    New Prescriptions New Prescriptions   ERYTHROMYCIN OPHTHALMIC OINTMENT    Place 1 application into the right eye 4 (four) times daily. Place 1/2 inch ribbon of ointment in the affected eye 4 times a day for 5 days     Nona Dell, [place] 08/27/16 0533    April Palumbo, MD 08/27/16 [phone]

## 2016-09-18 ENCOUNTER — Telehealth: Payer: Self-pay | Admitting: Medical

## 2016-09-18 NOTE — Telephone Encounter (Signed)
Pt has been dismissed from practice. Distinctly remember writing letter yet I can't find it in epic. Pt has severe anemia and she never followed up as recommended. But can't find letter yet dismissed. Help with out with this? Will send asheley RN a note about this??

## 2017-03-13 ENCOUNTER — Encounter (HOSPITAL_BASED_OUTPATIENT_CLINIC_OR_DEPARTMENT_OTHER): Payer: Self-pay

## 2017-03-13 ENCOUNTER — Emergency Department (HOSPITAL_BASED_OUTPATIENT_CLINIC_OR_DEPARTMENT_OTHER): Payer: Self-pay

## 2017-03-13 ENCOUNTER — Emergency Department (HOSPITAL_BASED_OUTPATIENT_CLINIC_OR_DEPARTMENT_OTHER)
Admission: EM | Admit: 2017-03-13 | Discharge: 2017-03-13 | Disposition: A | Discharge status: Home / Self Care | Payer: Self-pay | Attending: Emergency Medicine | Admitting: Emergency Medicine

## 2017-03-13 DIAGNOSIS — Y9289 Other specified places as the place of occurrence of the external cause: Secondary | ICD-10-CM | POA: Insufficient documentation

## 2017-03-13 DIAGNOSIS — Y9301 Activity, walking, marching and hiking: Secondary | ICD-10-CM | POA: Insufficient documentation

## 2017-03-13 DIAGNOSIS — Y999 Unspecified external cause status: Secondary | ICD-10-CM | POA: Insufficient documentation

## 2017-03-13 DIAGNOSIS — X501XXA Overexertion from prolonged static or awkward postures, initial encounter: Secondary | ICD-10-CM | POA: Insufficient documentation

## 2017-03-13 DIAGNOSIS — S92352A Displaced fracture of fifth metatarsal bone, left foot, initial encounter for closed fracture: Secondary | ICD-10-CM

## 2017-03-13 MED ORDER — NAPROXEN 500 MG PO TABS: 500.0000 mg | ORAL_TABLET | Freq: Two times a day (BID) | ORAL | 0 refills | Status: DC | PRN

## 2017-03-13 MED ORDER — HYDROCODONE-ACETAMINOPHEN 5-325 MG PO TABS: 1.0000 | ORAL_TABLET | Freq: Four times a day (QID) | ORAL | 0 refills | Status: DC | PRN

## 2017-03-13 NOTE — ED Triage Notes (Signed)
Pt states she twisted/injured left foot waking in heels on 8/3-NAD-steady gait

## 2017-03-13 NOTE — ED Notes (Signed)
PMS intact before and after. Pt tolerated well. All questions answered. 

## 2017-03-13 NOTE — Discharge Instructions (Signed)
Wear splint at all times until you see the orthopedist/podiatrist. Use crutches for all weight bearing activities. Ice and elevate the foot throughout the day, using ice pack for no more than 20 minutes every hour.  Alternate between naprosyn and norco for pain relief. Do not drive or operate machinery with pain medication use. Call podiatrist follow up today or tomorrow to schedule followup appointment in 5-7 days for ongoing management of your foot fracture. Return to the ER for changes or worsening symptoms.

## 2017-03-13 NOTE — ED Provider Notes (Signed)
[facility] DEPT [facility] Provider Note   CSN: [id number] Arrival date & time: 03/13/17  1147     History   Chief Complaint Chief Complaint  Patient presents with  . Foot Injury    HPI Karen Lutz is a 46 y.o. female with a PMHx of anemia and migraines as well as other medical conditions listed below, who presents to the ED with complaints of left foot injury that occurred on 03/08/17. Patient states that she was walking in heels and accidentally twisted her foot, denies falling, denies head injury/LOC. She has had pain, swelling, and bruising since then. She describes the pain as 10/10 constant throbbing nonradiating left lateral foot pain that worsens with pain being her foot with gravity or walking, and mildly improved with ibuprofen. She denies numbness, tingling, focal weakness, abrasions, or any other complaints at this time. No other injury sustained. No orthopedic care at this time or recently.    The history is provided by the patient and medical records. No language interpreter was used.  Foot Injury   The incident occurred more than 2 days ago. The incident occurred in the [place]. The injury mechanism was a fall. The pain is present in the left foot. The quality of the pain is described as throbbing. The pain is at a severity of 10/10. The pain is severe. The pain has been constant since onset. Associated symptoms include inability to bear weight. Pertinent negatives include no numbness, no loss of motion, no muscle weakness, no loss of sensation and no tingling. She reports no foreign bodies present. The symptoms are aggravated by activity and bearing weight. She has tried NSAIDs for the symptoms. The treatment provided mild relief.    Past Medical History:  Diagnosis Date  . Acid reflux   . Anemia   . Fibroids   . Heart murmur   . UTI (lower urinary tract infection)     Patient Active Problem List   Diagnosis Date Noted  . Epigastric pain 07/03/2013  . Anemia  07/03/2013  . Migraine 07/03/2013  . Mood disorder ([place]) 02/15/2011  . Iron deficiency anemia 02/15/2011  . Uterine fibroid 02/15/2011  . GERD (gastroesophageal reflux disease) 02/15/2011    Past Surgical History:  Procedure Laterality Date  . ceaserian    . CESAREAN SECTION    . CESAREAN SECTION    . KNEE SURGERY    . KNEE SURGERY      OB History    No data available       Home Medications    Prior to Admission medications   Not on File    Family History Family History  Problem Relation Age of Onset  . Hypertension Other   . Cancer Other   . Alcohol abuse Mother   . Heart disease Mother   . Alcohol abuse Father   . Heart disease Father   . Arthritis Maternal Grandmother   . Arthritis Maternal Grandfather   . Arthritis Paternal Grandmother   . Arthritis Paternal Grandfather     Social History Social History  Substance Use Topics  . Smoking status: Never Smoker  . Smokeless tobacco: Never Used  . Alcohol use No     Allergies   Penicillins   Review of Systems Review of Systems  HENT: Negative for facial swelling (no head inj).   Musculoskeletal: Positive for arthralgias and joint swelling.  Skin: Positive for color change. Negative for wound.  Allergic/Immunologic: Negative for immunocompromised state.  Neurological: Negative for tingling, syncope,  weakness and numbness.  Psychiatric/Behavioral: Negative for confusion.     Physical Exam Updated Vital Signs BP (!) 118/47 (BP Location: Left Arm)   Pulse 77   Temp 98.8 F (37.1 C) (Oral)   Resp 18   Ht 5\' 7"  (1.702 m)   Wt 115 kg (253 lb 8.5 oz)   LMP 02/25/2017   SpO2 100%   BMI 39.71 kg/m   Physical Exam  Constitutional: She is oriented to person, place, and time. Vital signs are normal. She appears well-developed and well-nourished.  Non-toxic appearance. No distress.  Afebrile, nontoxic, NAD  HENT:  Head: Normocephalic and atraumatic.  Mouth/Throat: Mucous membranes are normal.    Eyes: Conjunctivae and EOM are normal. Right eye exhibits no discharge. Left eye exhibits no discharge.  Neck: Normal range of motion. Neck supple.  Cardiovascular: Normal rate and intact distal pulses.   Pulmonary/Chest: Effort normal. No respiratory distress.  Abdominal: Normal appearance. She exhibits no distension.  Musculoskeletal: Normal range of motion.       Left foot: There is tenderness, bony tenderness and swelling. There is normal range of motion, normal capillary refill, no crepitus, no deformity and no laceration.  L foot with mild TTP, bruising, and swelling over 5th metatarsal, FROM intact in all toes and at ankle, no focal TTP of ankle or calf, no crepitus or deformity, skin intact, Strength and sensation grossly intact, distal pulses intact, compartments soft.   Neurological: She is alert and oriented to person, place, and time. She has normal strength. No sensory deficit.  Skin: Skin is warm, dry and intact. No rash noted.  Psychiatric: She has a normal mood and affect. Her behavior is normal.  Nursing note and vitals reviewed.    ED Treatments / Results  Labs (all labs ordered are listed, but only abnormal results are displayed) Labs Reviewed - No data to display  EKG  EKG Interpretation None       Radiology Dg Foot Complete Left  Result Date: 03/13/2017 CLINICAL DATA:  Fifth metatarsal pain. EXAM: LEFT FOOT - COMPLETE 3+ VIEW COMPARISON:  None. FINDINGS: Minimally displaced fracture of the fifth metatarsal neck with approximately 3 mm medial displacement of the distal fragment. No additional fracture seen. Pes planus. IMPRESSION: Minimally displaced fracture of the fifth metatarsal neck. Electronically Signed   By: Titus Dubin M.D.   On: 03/13/2017 12:20    Procedures Procedures (including critical care time)  SPLINT APPLICATION Date/Time: 4:13 PM Authorized by: Reece Agar Consent: Verbal consent obtained. Risks and benefits: risks, benefits and  alternatives were discussed Consent given by: patient Splint applied by: orthopedic technician Location details: L leg Splint type: short leg posterior Supplies used: orthoglass Post-procedure: The splinted body part was neurovascularly unchanged following the procedure. Patient tolerance: Patient tolerated the procedure well with no immediate complications.     Medications Ordered in ED Medications - No data to display   Initial Impression / Assessment and Plan / ED Course  I have reviewed the triage vital signs and the nursing notes.  Pertinent labs & imaging results that were available during my care of the patient were reviewed by me and considered in my medical decision making (see chart for details).     46 y.o. female here after L foot injury sustained 5 days ago, on exam, mild swelling/TTP/bruising to L foot over 5th metatarsal, wiggles toes without difficulty, NVI with soft compartments. Xray confirms fx of neck of 5th metatarsal. Will apply splint and give crutches, pain med  rx given, RICE advised, f/up with podiatrist for ongoing management. [place] reviewed prior to dispensing controlled substance medications, and 1 year search was notable for: none found. Risks/benefits/alternatives and expectations discussed regarding controlled substances. Side effects of medications discussed. Informed consent obtained. I explained the diagnosis and have given explicit precautions to return to the ER including for any other new or worsening symptoms. The patient understands and accepts the medical plan as it's been dictated and I have answered their questions. Discharge instructions concerning home care and prescriptions have been given. The patient is STABLE and is discharged to home in good condition.    Final Clinical Impressions(s) / ED Diagnoses   Final diagnoses:  Closed displaced fracture of fifth metatarsal bone of left foot, initial encounter    New  Prescriptions New Prescriptions   HYDROCODONE-ACETAMINOPHEN (NORCO) 5-325 MG TABLET    Take 1 tablet by mouth every 6 (six) hours as needed for severe pain.   NAPROXEN (NAPROSYN) 500 MG TABLET    Take 1 tablet (500 mg total) by mouth 2 (two) times daily as needed for mild pain, moderate pain or headache (TAKE WITH MEALS.).     [address] [place], [place] 03/13/17 1410    Elnora Morrison, MD 03/15/17 [phone]

## 2017-03-13 NOTE — ED Notes (Signed)
ED Provider at bedside. 

## 2017-03-20 ENCOUNTER — Ambulatory Visit: Payer: Self-pay | Admitting: Podiatry

## 2017-03-20 ENCOUNTER — Encounter: Payer: Self-pay | Admitting: Podiatry

## 2017-03-20 DIAGNOSIS — S92355A Nondisplaced fracture of fifth metatarsal bone, left foot, initial encounter for closed fracture: Secondary | ICD-10-CM

## 2017-03-20 NOTE — Progress Notes (Signed)
Patient ID: Karen Lutz, female   DOB: [birth year], 46 y.o.   MRN: [id number]   HPI: 46 year old female presents to the office today for evaluation of left foot pain. Patient states that she was walking in a straight when she tripped and fell in [place]. Patient noticed significant swelling and tenderness to her left foot and went to the emergency departmeays were taken. X-rays positive for nondisplaced fracture of the neck of the fifth matarsal left  Foot.patient presents toor follow-upand evaluatio   Physical Exam: General: The patient is alert and oriented x3 in no acute distress.  Dermatology: Skin is warm, dry and supple bilateral lower extremities. Negative for open lesions or macerations.  Vascular: Palpable pedal pulses bilaterally. No edema or erythema noted. Capillary refill within normal limits.  Neurological: Epicritic and protective threshold grossly intact bilaterally.   Musculoskeletal Exam: Range of motion within normal limits to all pedal and ankle joints bilateral. Muscle strength 5/5 in all groups bilateral.   Radiographic Exam taken in ED:  Minimally displaced fracture of the fifth metatarsal neck.  Assessment: 1. Fracture fifth metatarsal  left foot   Plan of Care:  1. Patient was evaluated. 2. Explained to the patient that she likely does not require surgical intervention. 3. Today a immobilization cam boot was dispensed. The patient can be minimal weightbearing in the immobilization cam boot 6 weeks. I stressed to the patient that she really needs to decrease her activity and allow her foot to heal 4. Compression anklet dispensed today 5. Return to clinic in 6 weeks   Edrick Kins, DPM [facility]  Dr. Edrick Kins, DPM    [address], [place] [address]                Office [phone]  Fax [phone]

## 2017-03-20 NOTE — Progress Notes (Signed)
   Subjective:    Patient ID: Karen Lutz, female    DOB: 12/10/1970, 46 y.o.   MRN: [id number]  HPI    Review of Systems  All other systems reviewed and are negative.      Objective:   Physical Exam        Assessment & Plan:

## 2017-04-03 ENCOUNTER — Telehealth: Payer: Self-pay | Admitting: Podiatry

## 2017-04-03 NOTE — Telephone Encounter (Signed)
I came in to see the doctor about my foot on 15 August. He gave me a boot to wear and the boot is now coming lose and the glue has come lose around the rods. I didn't know if I could get another boot or what you would recommend. If you would, please call me back at [phone]. Thank you.

## 2017-04-03 NOTE — Telephone Encounter (Signed)
I told pt if she would bring the boot in, our office would exchange the boot. Pt states she will come in tomorrow.

## 2017-07-27 ENCOUNTER — Other Ambulatory Visit: Payer: Self-pay

## 2017-07-27 ENCOUNTER — Encounter (HOSPITAL_COMMUNITY): Payer: Self-pay | Admitting: Emergency Medicine

## 2017-07-27 ENCOUNTER — Emergency Department (HOSPITAL_COMMUNITY)
Admission: EM | Admit: 2017-07-27 | Discharge: 2017-07-27 | Disposition: A | Discharge status: Home / Self Care | Payer: Self-pay | Attending: Emergency Medicine | Admitting: Emergency Medicine

## 2017-07-27 DIAGNOSIS — S46811A Strain of other muscles, fascia and tendons at shoulder and upper arm level, right arm, initial encounter: Secondary | ICD-10-CM | POA: Insufficient documentation

## 2017-07-27 DIAGNOSIS — Y939 Activity, unspecified: Secondary | ICD-10-CM | POA: Insufficient documentation

## 2017-07-27 DIAGNOSIS — Y929 Unspecified place or not applicable: Secondary | ICD-10-CM | POA: Insufficient documentation

## 2017-07-27 DIAGNOSIS — Y999 Unspecified external cause status: Secondary | ICD-10-CM | POA: Insufficient documentation

## 2017-07-27 DIAGNOSIS — X58XXXA Exposure to other specified factors, initial encounter: Secondary | ICD-10-CM | POA: Insufficient documentation

## 2017-07-27 MED ORDER — IBUPROFEN 800 MG PO TABS: 800.0000 mg | ORAL_TABLET | Freq: Three times a day (TID) | ORAL | 0 refills | Status: DC | PRN

## 2017-07-27 MED ORDER — METHOCARBAMOL 500 MG PO TABS: 500.0000 mg | ORAL_TABLET | Freq: Three times a day (TID) | ORAL | 0 refills | Status: DC | PRN

## 2017-07-27 MED ORDER — IBUPROFEN 800 MG PO TABS
800.0000 mg | ORAL_TABLET | Freq: Once | ORAL | Status: AC
  Administered 2017-07-27: 800 mg via ORAL
  Filled 2017-07-27: qty 1

## 2017-07-27 NOTE — ED Provider Notes (Signed)
TIME SEEN: 5:06 AM  CHIEF COMPLAINT: Right neck and shoulder pain  HPI: Patient is a 46 year old female with no significant past medical history who is right-hand-dominant who presents to the emergency department with right neck and shoulder pain for the past week.  No injury.  No numbness, tingling or focal weakness.  No bowel or bladder incontinence.  Has been taking Tylenol and ibuprofen without much relief.  States it is keeping her from sleeping at night.  She is able to move the shoulder but feels tight in her neck when she does so.  ROS: See HPI Constitutional: no fever  Eyes: no drainage  ENT: no runny nose   Cardiovascular:  no chest pain  Resp: no SOB  GI: no vomiting GU: no dysuria Integumentary: no rash  Allergy: no hives  Musculoskeletal: no leg swelling  Neurological: no slurred speech ROS otherwise negative  PAST MEDICAL HISTORY/PAST SURGICAL HISTORY:  Past Medical History:  Diagnosis Date  . Acid reflux   . Anemia   . Fibroids   . Heart murmur   . UTI (lower urinary tract infection)     MEDICATIONS:  Prior to Admission medications   Medication Sig Start Date End Date Taking? Authorizing Provider  HYDROcodone-acetaminophen (NORCO) 5-325 MG tablet Take 1 tablet by mouth every 6 (six) hours as needed for severe pain. Patient not taking: Reported on 07/27/2017 03/13/17   Street, [place], PA-C  naproxen (NAPROSYN) 500 MG tablet Take 1 tablet (500 mg total) by mouth 2 (two) times daily as needed for mild pain, moderate pain or headache (TAKE WITH MEALS.). Patient not taking: Reported on 03/20/2017 03/13/17   Street, [place], [place]    ALLERGIES:  Allergies  Allergen Reactions  . Penicillins Rash    Has patient had a PCN reaction causing immediate rash, facial/tongue/throat swelling, SOB or lightheadedness with hypotension: unknown Has patient had a PCN reaction causing severe rash involving mucus membranes or skin necrosis: unknown Has patient had a PCN reaction that  required hospitalization : yes Has patient had a PCN reaction occurring within the last 10 years: no If all of the above answers are "NO", then may proceed with Cephalosporin use.   Marland Kitchen Penicillin G Other (See Comments)    Per patient, childhood allergy, reaction unknown.      SOCIAL HISTORY:  Social History   Tobacco Use  . Smoking status: Never Smoker  . Smokeless tobacco: Never Used  Substance Use Topics  . Alcohol use: No    FAMILY HISTORY: Family History  Problem Relation Age of Onset  . Hypertension Other   . Cancer Other   . Alcohol abuse Mother   . Heart disease Mother   . Alcohol abuse Father   . Heart disease Father   . Arthritis Maternal Grandmother   . Arthritis Maternal Grandfather   . Arthritis Paternal Grandmother   . Arthritis Paternal Grandfather     EXAM: BP 137/74 (BP Location: Left Arm)   Pulse 77   Temp 98.3 F (36.8 C) (Oral)   Resp 16   Ht 5\' 6"  (1.676 m)   Wt 118.4 kg (261 lb)   SpO2 100%   BMI 42.13 kg/m  CONSTITUTIONAL: Alert and oriented and responds appropriately to questions. Well-appearing; well-nourished HEAD: Normocephalic EYES: Conjunctivae clear, pupils appear equal, EOMI ENT: normal nose; moist mucous membranes NECK: Supple, no meningismus, no nuchal rigidity, no LAD, no midline spinal tenderness or step-off or deformity, tender to palpation over the right trapezius muscle with associated muscle  spasm CARD: RRR; S1 and S2 appreciated; no murmurs, no clicks, no rubs, no gallops RESP: Normal chest excursion without splinting or tachypnea; breath sounds clear and equal bilaterally; no wheezes, no rhonchi, no rales, no hypoxia or respiratory distress, speaking full sentences ABD/GI: Normal bowel sounds; non-distended; soft, non-tender, no rebound, no guarding, no peritoneal signs, no hepatosplenomegaly BACK:  The back appears normal and is non-tender to palpation, there is no CVA tenderness, no midline spinal tenderness or step-off or  deformity EXT: Normal ROM in all joints; non-tender to palpation; no edema; normal capillary refill; no cyanosis, no calf tenderness or swelling, no bony tenderness over the right shoulder with full range of motion in this joint, 2+ radial pulses bilaterally, no redness or warmth or swelling noted to the right arm    SKIN: Normal color for age and race; warm; no rash NEURO: Moves all extremities equally, normal sensation diffusely, cranial nerves II through XII intact, normal speech, normal gait PSYCH: The patient's mood and manner are appropriate. Grooming and personal hygiene are appropriate.  MEDICAL DECISION MAKING: Patient here with trapezius muscle strain, spasm.  No midline spinal tenderness.  No focal neurologic deficits.  Full range of motion in the right shoulder without erythema, warmth or joint effusion.  I recommended she continue Tylenol and ibuprofen, alternate heat and ice.  We will also provide prescription for Robaxin for symptomatic relief.  She drove herself to the emergency department and therefore I cannot give her any of these medications here.  She verbalized understanding.  I do not feel she needs emergent imaging.  Doubt any fracture.  Doubt cauda equina, spinal stenosis, epidural abscess or hematoma, discitis, transverse myelitis.  At this time, I do not feel there is any life-threatening condition present. I have reviewed and discussed all results (EKG, imaging, lab, urine as appropriate) and exam findings with patient/family. I have reviewed nursing notes and appropriate previous records.  I feel the patient is safe to be discharged home without further emergent workup and can continue workup as an outpatient as needed. Discussed usual and customary return precautions. Patient/family verbalize understanding and are comfortable with this plan.  Outpatient follow-up has been provided if needed. All questions have been answered.      Jennel Mara, Delice Bison, DO 07/27/17 [phone]

## 2017-07-27 NOTE — ED Triage Notes (Signed)
Patient is complaining of neck pain and shoulder pain. Patient states that it has been like that for a while. Patient states it is keeping her from getting a good nice rest.

## 2017-07-27 NOTE — Discharge Instructions (Signed)
You may alternate Tylenol 1000 mg every 6 hours as needed for pain and Ibuprofen 800 mg every 8 hours as needed for pain.  Please take Ibuprofen with food. ° ° ° °To find a primary care or specialty doctor please call [phone] or [phone] to access "[place] Find a Doctor Service." ° °You may also go on the [facility] website at [email or web address] ° °There are also multiple [facility], [facility], [facility] and [facility] practices throughout the [place] that are frequently accepting new patients. You may find a clinic that is close to your home and contact them. ° °[facility] -  °[address] °[place] [address] °[phone] ° ° °[facility] -  °[address] °[place] [address] °[phone] ° ° °[facility] - °[address]  °[place] [address] °[phone] ° ° °

## 2018-07-07 ENCOUNTER — Encounter (HOSPITAL_COMMUNITY): Payer: Self-pay | Admitting: Emergency Medicine

## 2018-07-07 ENCOUNTER — Other Ambulatory Visit: Payer: Self-pay

## 2018-07-07 ENCOUNTER — Emergency Department (HOSPITAL_COMMUNITY)
Admission: EM | Admit: 2018-07-07 | Discharge: 2018-07-07 | Disposition: A | Discharge status: Home / Self Care | Payer: Self-pay | Attending: Emergency Medicine | Admitting: Emergency Medicine

## 2018-07-07 DIAGNOSIS — M25561 Pain in right knee: Secondary | ICD-10-CM | POA: Insufficient documentation

## 2018-07-07 DIAGNOSIS — H547 Unspecified visual loss: Secondary | ICD-10-CM | POA: Insufficient documentation

## 2018-07-07 MED ORDER — TETRACAINE HCL 0.5 % OP SOLN
1.0000 [drp] | Freq: Once | OPHTHALMIC | Status: AC
  Administered 2018-07-07: 2 [drp] via OPHTHALMIC
  Filled 2018-07-07: qty 4

## 2018-07-07 MED ORDER — KETOROLAC TROMETHAMINE 30 MG/ML IJ SOLN
30.0000 mg | Freq: Once | INTRAMUSCULAR | Status: AC
  Administered 2018-07-07: 30 mg via INTRAMUSCULAR
  Filled 2018-07-07: qty 1

## 2018-07-07 NOTE — ED Provider Notes (Addendum)
[facility]ERGENC[facility] DEPARTMENT Provider Note   CSN: [id number] Arrival date & time: 07/07/18  1016     History   Chief Complaint Chief Complaint  Patient presents with  . Blurred Vision    for several weeks    HPI Karen Lutz is a 47 y.o. female.  HPI   47 year old female presents today with several complaints.  Patient notes that over the course of approximately 4 months she has had slowly worsening vision.  She describes this is blurry and bilateral.  She denies any double vision or significant ocular pain.  She denies any trauma to her eyes or history of eye problems.  Patient reports that over the last week she has had some pressure in the frontal aspect of her head and over her eyes.  She denies any fever or infectious symptoms.  Patient also notes 1 week history of right knee pain worse with ambulation noted on the anterior aspect.  No trauma.  No lower extremity swelling or edema.  No medications prior to arrival.   Past Medical History:  Diagnosis Date  . Acid reflux   . Anemia   . Fibroids   . Heart murmur   . UTI (lower urinary tract infection)     Patient Active Problem List   Diagnosis Date Noted  . Epigastric pain 07/03/2013  . Anemia 07/03/2013  . Migraine 07/03/2013  . Mood disorder ([place]) 02/15/2011  . Iron deficiency anemia 02/15/2011  . Uterine fibroid 02/15/2011  . GERD (gastroesophageal reflux disease) 02/15/2011    Past Surgical History:  Procedure Laterality Date  . ceaserian    . CESAREAN SECTION    . CESAREAN SECTION    . KNEE SURGERY    . KNEE SURGERY       OB History   None      Home Medications    Prior to Admission medications   Medication Sig Start Date End Date Taking? Authorizing Provider  ibuprofen (ADVIL,MOTRIN) 800 MG tablet Take 1 tablet (800 mg total) by mouth every 8 (eight) hours as needed for mild pain. 07/27/17   Ward, Delice Bison, DO  methocarbamol (ROBAXIN) 500 MG tablet Take 1 tablet  (500 mg total) by mouth every 8 (eight) hours as needed for muscle spasms. 07/27/17   Ward, Delice Bison, DO  dicyclomine (BENTYL) 20 MG tablet Take 1 tablet (20 mg total) by mouth 2 (two) times daily. Patient not taking: Reported on 12/06/2015 09/04/15 12/20/15  Charlesetta Shanks, MD  iron polysaccharides (NIFEREX) 150 MG capsule Take 1 capsule (150 mg total) by mouth daily. Patient not taking: Reported on 12/06/2015 09/03/15 12/20/15  Quintella Reichert, MD  sucralfate (CARAFATE) 1 g tablet Take 1 tablet (1 g total) by mouth 4 (four) times daily -  with meals and at bedtime. Patient not taking: Reported on 12/06/2015 09/04/15 12/20/15  Charlesetta Shanks, MD    Family History Family History  Problem Relation Age of Onset  . Hypertension Other   . Cancer Other   . Alcohol abuse Mother   . Heart disease Mother   . Alcohol abuse Father   . Heart disease Father   . Arthritis Maternal Grandmother   . Arthritis Maternal Grandfather   . Arthritis Paternal Grandmother   . Arthritis Paternal Grandfather     Social History Social History   Tobacco Use  . Smoking status: Never Smoker  . Smokeless tobacco: Never Used  Substance Use Topics  . Alcohol use: No  . Drug  use: No     Allergies   Penicillins and Penicillin g   Review of Systems Review of Systems  All other systems reviewed and are negative.  Physical Exam Updated Vital Signs BP 138/75 (BP Location: Right Arm)   Pulse 69   Temp 98 F (36.7 C) (Oral)   Resp (!) 22   Ht 5\' 7"  (1.702 m)   LMP 07/03/2018 (Exact Date)   SpO2 100%   BMI 40.88 kg/m   Physical Exam  Constitutional: She is oriented to person, place, and time. She appears well-developed and well-nourished.  HENT:  Head: Normocephalic and atraumatic.  Eyes: Pupils are equal, round, and reactive to light. Conjunctivae are normal. Right eye exhibits no discharge. Left eye exhibits no discharge. No scleral icterus.  Pupils equal round reactive to light, white layering over the  superior aspect of bilateral iris-no conjunctival injection, no corneal defects noted-pressure in right eye 20, pressure left eye 18; vison 20/52 bilateral, 20/63 R, 20/40 L  Neck: Normal range of motion. No JVD present. No tracheal deviation present.  Pulmonary/Chest: Effort normal. No stridor.  Musculoskeletal:  Right knee atraumatic no swelling or edema, tenderness palpation of the anterior and lateral joint lines, no laxity noted no redness or warmth to touch, full active range of motion  Neurological: She is alert and oriented to person, place, and time. She has normal strength. No cranial nerve deficit or sensory deficit. Coordination normal. GCS eye subscore is 4. GCS verbal subscore is 5. GCS motor subscore is 6.  Psychiatric: She has a normal mood and affect. Her behavior is normal. Judgment and thought content normal.  Nursing note and vitals reviewed.   ED Treatments / Results  Labs (all labs ordered are listed, but only abnormal results are displayed) Labs Reviewed - No data to display  EKG None  Radiology No results found.  Procedures Procedures (including critical care time)  Medications Ordered in ED Medications  tetracaine (PONTOCAINE) 0.5 % ophthalmic solution 1-2 drop (2 drops Both Eyes Given 07/07/18 1126)  ketorolac (TORADOL) 30 MG/ML injection 30 mg (30 mg Intramuscular Given 07/07/18 1123)     Initial Impression / Assessment and Plan / ED Course  I have reviewed the triage vital signs and the nursing notes.  Pertinent labs & imaging results that were available during my care of the patient were reviewed by me and considered in my medical decision making (see chart for details).     Labs:   Imaging:  Consults:  Therapeutics: Tetracaine, fluorescein  Discharge Meds:   Assessment/Plan: 47 year old female presents today with decreased visual acuity.  This has been months in nature slowly worsening.  She had no abrupt loss of vision, no associated  neurological deficits.  Patient has no acute abnormalities noted here she will follow-up as an outpatient with ophthalmology.  Patient also having right knee pain this is atraumatic no signs of infection or significant joint derangement.  Symptomatic care instructions given outpatient follow-up instructions given.  Patient with minor headache here today given Toradol no neurological deficits or red flags.  Patient will follow-up as an outpatient.  Return precautions given.  Verbalized understanding and agreement to this plan.    Final Clinical Impressions(s) / ED Diagnoses   Final diagnoses:  Decreased visual acuity  Acute pain of right knee    ED Discharge Orders    None       Okey Regal, PA-C 07/07/18 1147    Okey Regal, PA-C 07/07/18 1147  Orlie Dakin, MD 07/07/18 1806

## 2018-07-07 NOTE — ED Triage Notes (Signed)
Pt reports bilateral blurred vision and headaches for several weeks, reports worsening over the last few days. Reports that she is having trouble reading her phone. Also reporting R knee pain, denies trauma but reports limping on it.

## 2018-07-07 NOTE — Discharge Instructions (Addendum)
Please read attached information. If you experience any new or worsening signs or symptoms please return to the emergency room for evaluation. Please follow-up with your primary care provider or specialist as discussed.  °

## 2018-07-07 NOTE — ED Notes (Signed)
Pt stable, ambulatory, states understanding of discharge instructions 

## 2018-07-07 NOTE — ED Notes (Signed)
Pt. Stated that seeing her phone about 1 foot away, she cannot read it, its blurry.

## 2018-08-26 ENCOUNTER — Other Ambulatory Visit: Payer: Self-pay

## 2018-08-26 ENCOUNTER — Encounter (HOSPITAL_COMMUNITY): Payer: Self-pay | Admitting: Emergency Medicine

## 2018-08-26 ENCOUNTER — Emergency Department (HOSPITAL_COMMUNITY)
Admission: EM | Admit: 2018-08-26 | Discharge: 2018-08-26 | Disposition: A | Discharge status: Home / Self Care | Payer: Self-pay | Attending: Emergency Medicine | Admitting: Emergency Medicine

## 2018-08-26 ENCOUNTER — Emergency Department (HOSPITAL_COMMUNITY): Payer: Self-pay

## 2018-08-26 DIAGNOSIS — R197 Diarrhea, unspecified: Secondary | ICD-10-CM | POA: Insufficient documentation

## 2018-08-26 DIAGNOSIS — R112 Nausea with vomiting, unspecified: Secondary | ICD-10-CM | POA: Insufficient documentation

## 2018-08-26 DIAGNOSIS — Z79899 Other long term (current) drug therapy: Secondary | ICD-10-CM | POA: Insufficient documentation

## 2018-08-26 LAB — URINALYSIS, ROUTINE W REFLEX MICROSCOPIC
Bilirubin Urine: NEGATIVE
Glucose, UA: NEGATIVE mg/dL
Hgb urine dipstick: NEGATIVE
Ketones, ur: NEGATIVE mg/dL
Leukocytes, UA: NEGATIVE
NITRITE: NEGATIVE
Protein, ur: NEGATIVE mg/dL
SPECIFIC GRAVITY, URINE: 1.025 (ref 1.005–1.030)
pH: 5 (ref 5.0–8.0)

## 2018-08-26 LAB — COMPREHENSIVE METABOLIC PANEL
ALT: 7 U/L (ref 0–44)
AST: 13 U/L — AB (ref 15–41)
Albumin: 3.1 g/dL — ABNORMAL LOW (ref 3.5–5.0)
Alkaline Phosphatase: 69 U/L (ref 38–126)
Anion gap: 6 (ref 5–15)
BUN: 8 mg/dL (ref 6–20)
CO2: 25 mmol/L (ref 22–32)
Calcium: 8.7 mg/dL — ABNORMAL LOW (ref 8.9–10.3)
Chloride: 108 mmol/L (ref 98–111)
Creatinine, Ser: 0.61 mg/dL (ref 0.44–1.00)
GFR calc Af Amer: >60 mL/min (ref >60)
GFR calc non Af Amer: >60 mL/min (ref >60)
Glucose, Bld: 97 mg/dL (ref 70–99)
Potassium: 3.6 mmol/L (ref 3.5–5.1)
Sodium: 139 mmol/L (ref 135–145)
Total Bilirubin: 0.1 mg/dL — ABNORMAL LOW (ref 0.3–1.2)
Total Protein: 6.7 g/dL (ref 6.5–8.1)

## 2018-08-26 LAB — CBC
HCT: 29.7 % — ABNORMAL LOW (ref 36.0–46.0)
HEMOGLOBIN: 7.8 g/dL — AB (ref 12.0–15.0)
MCH: 17.8 pg — ABNORMAL LOW (ref 26.0–34.0)
MCHC: 26.3 g/dL — AB (ref 30.0–36.0)
MCV: 67.7 fL — ABNORMAL LOW (ref 80.0–100.0)
Platelets: 293 10*3/uL (ref 150–400)
RBC: 4.39 MIL/uL (ref 3.87–5.11)
RDW: 18.1 % — ABNORMAL HIGH (ref 11.5–15.5)
WBC: 4.6 10*3/uL (ref 4.0–10.5)
nRBC: 0 % (ref 0.0–0.2)

## 2018-08-26 LAB — I-STAT BETA HCG BLOOD, ED (MC, WL, AP ONLY): I-stat hCG, quantitative: <5 m[IU]/mL (ref <5)

## 2018-08-26 LAB — LIPASE, BLOOD: Lipase: 36 U/L (ref 11–51)

## 2018-08-26 MED ORDER — ALUM & MAG HYDROXIDE-SIMETH 200-200-20 MG/5ML PO SUSP
30.0000 mL | Freq: Once | ORAL | Status: AC
  Administered 2018-08-26: 30 mL via ORAL
  Filled 2018-08-26: qty 30

## 2018-08-26 MED ORDER — MORPHINE SULFATE (PF) 4 MG/ML IV SOLN
6.0000 mg | Freq: Once | INTRAVENOUS | Status: AC
  Administered 2018-08-26: 6 mg via INTRAVENOUS
  Filled 2018-08-26: qty 2

## 2018-08-26 MED ORDER — ONDANSETRON HCL 4 MG PO TABS: 4.0000 mg | ORAL_TABLET | Freq: Four times a day (QID) | ORAL | 0 refills | Status: DC

## 2018-08-26 MED ORDER — SODIUM CHLORIDE 0.9% FLUSH
3.0000 mL | Freq: Once | INTRAVENOUS | Status: AC
  Administered 2018-08-26: 3 mL via INTRAVENOUS

## 2018-08-26 MED ORDER — ONDANSETRON HCL 4 MG/2ML IJ SOLN
4.0000 mg | Freq: Once | INTRAMUSCULAR | Status: AC
  Administered 2018-08-26: 4 mg via INTRAVENOUS
  Filled 2018-08-26: qty 2

## 2018-08-26 MED ORDER — SODIUM CHLORIDE 0.9 % IV BOLUS
1000.0000 mL | Freq: Once | INTRAVENOUS | Status: AC
  Administered 2018-08-26: 1000 mL via INTRAVENOUS

## 2018-08-26 NOTE — ED Triage Notes (Signed)
Pt with epigastric to mid abdominal pain since eating food from a restaurant Saturday night. Nausea and vomiting and diarrhea but denies blood.

## 2018-08-26 NOTE — ED Notes (Signed)
Pt given water cup and tolerating PO fluids well

## 2018-08-26 NOTE — ED Provider Notes (Signed)
[facility] EM[facility] DEPARTMENT Provider Note   CSN: [id number] Arrival date & time: 08/26/18  1353     History   Chief Complaint Chief Complaint  Patient presents with  . Abdominal Pain  . Possible Food Poisoning    HPI Karen Lutz is a 48 y.o. female.  The history is provided by the patient and medical records. No language interpreter was used.  Abdominal Pain     48 year old female with history of GERD, anemia, fibroid presenting for evaluation of abdominal pain.  Patient endorsed having nausea and vomiting and diarrheafor the past 3 days.  Symptoms started after she ate out the night before.  She endorsed feeling nauseous, vomiting of nonbloody nonbilious contents and since yesterday she developed pain in her upper Abdomen.  Pain is sharp, crampy, rated across abdomen, worsening with eating.  Rates pain as 8 out of 10.  Endorse normal bowel movement.  Does not have any fever chills no chest pain or shortness of breath, no dysuria hematuria.  She is currently on her menstruation.  She does have history of iron deficiency anemia.  She has required blood transfusion in the past.  She has not noticed any dark tarry stool.  She does not take anything for her anemia.  She still has an intact gallbladder.  Past Medical History:  Diagnosis Date  . Acid reflux   . Anemia   . Fibroids   . Heart murmur   . UTI (lower urinary tract infection)     Patient Active Problem List   Diagnosis Date Noted  . Epigastric pain 07/03/2013  . Anemia 07/03/2013  . Migraine 07/03/2013  . Mood disorder ([place]) 02/15/2011  . Iron deficiency anemia 02/15/2011  . Uterine fibroid 02/15/2011  . GERD (gastroesophageal reflux disease) 02/15/2011    Past Surgical History:  Procedure Laterality Date  . ceaserian    . CESAREAN SECTION    . CESAREAN SECTION    . KNEE SURGERY    . KNEE SURGERY       OB History   No obstetric history on file.      Home Medications     Prior to Admission medications   Medication Sig Start Date End Date Taking? Authorizing Provider  ibuprofen (ADVIL,MOTRIN) 800 MG tablet Take 1 tablet (800 mg total) by mouth every 8 (eight) hours as needed for mild pain. 07/27/17   Ward, Delice Bison, DO  methocarbamol (ROBAXIN) 500 MG tablet Take 1 tablet (500 mg total) by mouth every 8 (eight) hours as needed for muscle spasms. 07/27/17   Ward, Delice Bison, DO    Family History Family History  Problem Relation Age of Onset  . Hypertension Other   . Cancer Other   . Alcohol abuse Mother   . Heart disease Mother   . Alcohol abuse Father   . Heart disease Father   . Arthritis Maternal Grandmother   . Arthritis Maternal Grandfather   . Arthritis Paternal Grandmother   . Arthritis Paternal Grandfather     Social History Social History   Tobacco Use  . Smoking status: Never Smoker  . Smokeless tobacco: Never Used  Substance Use Topics  . Alcohol use: No  . Drug use: No     Allergies   Penicillins and Penicillin g   Review of Systems Review of Systems  Gastrointestinal: Positive for abdominal pain.  All other systems reviewed and are negative.    Physical Exam Updated Vital Signs BP 119/72 (BP Location: Right  Arm)   Pulse 85   Temp 98.3 F (36.8 C) (Oral)   Resp 16   SpO2 100%   Physical Exam Vitals signs and nursing note reviewed.  Constitutional:      General: She is not in acute distress.    Appearance: She is well-developed. She is obese.  HENT:     Head: Atraumatic.  Eyes:     Conjunctiva/sclera: Conjunctivae normal.  Neck:     Musculoskeletal: Neck supple.  Cardiovascular:     Rate and Rhythm: Normal rate and regular rhythm.  Pulmonary:     Effort: Pulmonary effort is normal.     Breath sounds: Normal breath sounds.  Abdominal:     General: Abdomen is flat. Bowel sounds are normal.     Tenderness: There is abdominal tenderness in the right upper quadrant and epigastric area. Negative signs  include Murphy's sign and McBurney's sign.  Skin:    Findings: No rash.  Neurological:     Mental Status: She is alert.      ED Treatments / Results  Labs (all labs ordered are listed, but only abnormal results are displayed) Labs Reviewed  COMPREHENSIVE METABOLIC PANEL - Abnormal; Notable for the following components:      Result Value   Calcium 8.7 (*)    Albumin 3.1 (*)    AST 13 (*)    Total Bilirubin 0.1 (*)    All other components within normal limits  CBC - Abnormal; Notable for the following components:   Hemoglobin 7.8 (*)    HCT 29.7 (*)    MCV 67.7 (*)    MCH 17.8 (*)    MCHC 26.3 (*)    RDW 18.1 (*)    All other components within normal limits  LIPASE, BLOOD  URINALYSIS, ROUTINE W REFLEX MICROSCOPIC  I-STAT BETA HCG BLOOD, ED ([facility], [facility], [facility] ONLY)    EKG None  Radiology US Abdomen Limited  Result Date: 08/26/2018 CLINICAL DATA:  Upper abdominal pain. EXAM: ULTRASOUND ABDOMEN LIMITED RIGHT UPPER QUADRANT COMPARISON:  09/04/2015 FINDINGS: Gallbladder: No gallstones or wall thickening visualized. No sonographic Murphy sign noted by sonographer. Common bile duct: Diameter: 2.7 mm Liver: No focal lesion identified. Increased heterogeneous parenchymal echogenicity. Portal vein is patent on color Doppler imaging with normal direction of blood flow towards the liver. IMPRESSION: Increased heterogeneous parenchymal echogenicity of the liver, usually associated with hepatic steatosis or fibrosis. Normal appearance of the gallbladder. Electronically Signed   By: Fidela Salisbury M.D.   On: 08/26/2018 18:13    Procedures Procedures (including critical care time)  Medications Ordered in ED Medications  sodium chloride flush (NS) 0.9 % injection 3 mL (3 mLs Intravenous Given 08/26/18 1651)  morphine 4 MG/ML injection 6 mg (6 mg Intravenous Given 08/26/18 1648)  ondansetron (ZOFRAN) injection 4 mg (4 mg Intravenous Given 08/26/18 1650)  sodium chloride 0.9 % bolus 1,000 mL  (1,000 mLs Intravenous New Bag/Given 08/26/18 1651)  alum & mag hydroxide-simeth (MAALOX/MYLANTA) 200-200-20 MG/5ML suspension 30 mL (30 mLs Oral Given 08/26/18 1651)     Initial Impression / Assessment and Plan / ED Course  I have reviewed the triage vital signs and the nursing notes.  Pertinent labs & imaging results that were available during my care of the patient were reviewed by me and considered in my medical decision making (see chart for details).     BP 119/72 (BP Location: Right Arm)   Pulse 85   Temp 98.3 F (36.8 C) (Oral)  Resp 16   SpO2 100%    Final Clinical Impressions(s) / ED Diagnoses   Final diagnoses:  None    ED Discharge Orders    None     4:40 PM Patient here with upper abdominal pain and associated nausea vomiting.  Normal bowel movement.  She would benefit from limitedabdominalultrasound showed normal appearance of the gallbladder to rule out gallbladder etiology.  Patient's labs remarkable for hemoglobin of 7.8.  She admits to history of iron deficiency anemia and states she is currently on her menstruation.  She does not complain of any generalized weakness or shortness of breath to suggest symptomatic anemia.  Her anemia is actually an improvement from her baseline.  6:50 PM UA without any signs of urinary tract infection, pregnancy test is negative, normal lipase, limited abdominal ultrasound demonstrate normal appearance of the gallbladder.  Some evidence of hepatic steatosis or fibrosis were noted.  Patient has normal liver function.  At this time, patient felt better after receiving treatment, she is able to tolerate p.o., stable for discharge.   Domenic Moras, PA-C 08/26/18 1942    Jola Schmidt, MD 08/26/18 2157

## 2018-11-04 ENCOUNTER — Ambulatory Visit (HOSPITAL_COMMUNITY)
Admission: EM | Admit: 2018-11-04 | Discharge: 2018-11-04 | Disposition: A | Discharge status: Home / Self Care | Payer: Self-pay | Attending: Family Medicine | Admitting: Family Medicine

## 2018-11-04 ENCOUNTER — Encounter (HOSPITAL_COMMUNITY): Payer: Self-pay | Admitting: Emergency Medicine

## 2018-11-04 DIAGNOSIS — S161XXA Strain of muscle, fascia and tendon at neck level, initial encounter: Secondary | ICD-10-CM

## 2018-11-04 MED ORDER — IBUPROFEN 800 MG PO TABS: 800.0000 mg | ORAL_TABLET | Freq: Three times a day (TID) | ORAL | 0 refills | Status: DC

## 2018-11-04 MED ORDER — KETOROLAC TROMETHAMINE 60 MG/2ML IM SOLN
INTRAMUSCULAR | Status: AC
  Filled 2018-11-04: qty 2

## 2018-11-04 MED ORDER — DEXAMETHASONE SODIUM PHOSPHATE 10 MG/ML IJ SOLN
INTRAMUSCULAR | Status: AC
  Filled 2018-11-04: qty 1

## 2018-11-04 MED ORDER — CYCLOBENZAPRINE HCL 5 MG PO TABS: 5.0000 mg | ORAL_TABLET | Freq: Two times a day (BID) | ORAL | 0 refills | Status: DC | PRN

## 2018-11-04 MED ORDER — DEXAMETHASONE SODIUM PHOSPHATE 10 MG/ML IJ SOLN
10.0000 mg | Freq: Once | INTRAMUSCULAR | Status: AC
  Administered 2018-11-04: 10 mg via INTRAMUSCULAR

## 2018-11-04 MED ORDER — KETOROLAC TROMETHAMINE 60 MG/2ML IM SOLN
60.0000 mg | Freq: Once | INTRAMUSCULAR | Status: AC
  Administered 2018-11-04: 60 mg via INTRAMUSCULAR

## 2018-11-04 NOTE — ED Triage Notes (Signed)
Pt c/o R sided neck pain x1 week, states she has tried multiple things at home without relief, states it hurts when she turns and looks to her Right. Pt also states it feelsl ike it goes up into her R ear.

## 2018-11-04 NOTE — Discharge Instructions (Signed)
We gave you an injection of Toradol and Decadron today- Please hold off on any further ibuprofen until tomorrow morning, may use flexeril tonight  Use anti-inflammatories for pain/swelling. You may take up to 800 mg Ibuprofen every 8 hours with food. You may supplement Ibuprofen with Tylenol [phone] mg every 8 hours.   You may use flexeril as needed to help with pain. This is a muscle relaxer and causes sedation- please use only at bedtime or when you will be home and not have to drive/work- begin with 1 tablet, may increase to 2  Gentle neck stretching  Follow up if symptoms worsening or not improving

## 2018-11-04 NOTE — ED Provider Notes (Signed)
[facility]    CSN: [id number] Arrival date & time: 11/04/18  1722     History   Chief Complaint Chief Complaint  Patient presents with  . Neck Pain    HPI Karen Lutz is a 48 y.o. female history of GERD presenting today for evaluation of right-sided neck pain.  Patient states that for the past 5 to 6 days she has had discomfort in the right side of her neck.  She feels her recently it is been radiating towards her right ear.  Denies changes in hearing.  Denies any injury, heavy lifting or accident.  States symptoms gradually worsened 1 day after waking up.  Denies any vision changes, dizziness or lightheadedness.  Denies chest pain or shortness of breath.  She has tried taking ibuprofen 200 mg, Tylenol, icy hot as well as Aspercreme without relief.  Denies recurrent issues with her neck.  Has also tried heating pad as well as gentle stretching.  HPI  Past Medical History:  Diagnosis Date  . Acid reflux   . Anemia   . Fibroids   . Heart murmur   . UTI (lower urinary tract infection)     Patient Active Problem List   Diagnosis Date Noted  . Epigastric pain 07/03/2013  . Anemia 07/03/2013  . Migraine 07/03/2013  . Mood disorder ([place]) 02/15/2011  . Iron deficiency anemia 02/15/2011  . Uterine fibroid 02/15/2011  . GERD (gastroesophageal reflux disease) 02/15/2011    Past Surgical History:  Procedure Laterality Date  . ceaserian    . CESAREAN SECTION    . CESAREAN SECTION    . KNEE SURGERY    . KNEE SURGERY      OB History   No obstetric history on file.      Home Medications    Prior to Admission medications   Medication Sig Start Date End Date Taking? Authorizing Provider  cyclobenzaprine (FLEXERIL) 5 MG tablet Take 1-2 tablets (5-10 mg total) by mouth 2 (two) times daily as needed for muscle spasms. 11/04/18   Kristena Wilhelmi C, PA-C  ibuprofen (ADVIL,MOTRIN) 800 MG tablet Take 1 tablet (800 mg total) by mouth 3 (three) times daily. 11/04/18    Trini Christiansen, Elesa Hacker, PA-C    Family History Family History  Problem Relation Age of Onset  . Hypertension Other   . Cancer Other   . Alcohol abuse Mother   . Heart disease Mother   . Alcohol abuse Father   . Heart disease Father   . Arthritis Maternal Grandmother   . Arthritis Maternal Grandfather   . Arthritis Paternal Grandmother   . Arthritis Paternal Grandfather     Social History Social History   Tobacco Use  . Smoking status: Never Smoker  . Smokeless tobacco: Never Used  Substance Use Topics  . Alcohol use: No  . Drug use: No     Allergies   Penicillins and Penicillin g   Review of Systems Review of Systems  Constitutional: Negative for fatigue and fever.  Eyes: Negative for visual disturbance.  Respiratory: Negative for shortness of breath.   Cardiovascular: Negative for chest pain.  Gastrointestinal: Negative for abdominal pain, nausea and vomiting.  Musculoskeletal: Positive for myalgias, neck pain and neck stiffness. Negative for arthralgias and joint swelling.  Skin: Negative for color change, rash and wound.  Neurological: Negative for dizziness, weakness, light-headedness and headaches.     Physical Exam Triage Vital Signs ED Triage Vitals [11/04/18 1736]  Enc Vitals Group  BP 126/82     Pulse Rate 95     Resp 16     Temp 98.8 F (37.1 C)     Temp src      SpO2 99 %     Weight      Height      Head Circumference      Peak Flow      Pain Score 9     Pain Loc      Pain Edu?      Excl. in [place]?    No data found.  Updated Vital Signs BP 126/82   Pulse 95   Temp 98.8 F (37.1 C)   Resp 16   LMP 10/04/2018   SpO2 99%   Visual Acuity Right Eye Distance:   Left Eye Distance:   Bilateral Distance:    Right Eye Near:   Left Eye Near:    Bilateral Near:     Physical Exam Vitals signs and nursing note reviewed.  Constitutional:      Appearance: She is well-developed.     Comments: No acute distress  HENT:     Head:  Normocephalic and atraumatic.     Ears:     Comments: Bilateral ears without tenderness to palpation of external auricle, tragus and mastoid, EAC's without erythema or swelling, TM's with good bony landmarks and cone of light. Non erythematous.    Nose: Nose normal.     Mouth/Throat:     Comments: Oral mucosa pink and moist, no tonsillar enlargement or exudate. Posterior pharynx patent and nonerythematous, no uvula deviation or swelling. Normal phonation. Eyes:     Conjunctiva/sclera: Conjunctivae normal.  Neck:     Musculoskeletal: Neck supple.  Cardiovascular:     Rate and Rhythm: Normal rate.  Pulmonary:     Effort: Pulmonary effort is normal. No respiratory distress.     Comments: Breathing comfortably at rest, CTABL, no wheezing, rales or other adventitious sounds auscultated Abdominal:     General: There is no distension.  Musculoskeletal: Normal range of motion.     Comments: Nontender to palpation of cervical and thoracic spine midline, increased tenderness to right trapezius and sternocleidomastoid musculature, does extend near ear Full active range of motion of neck although pain is elicited with rightward rotation   Skin:    General: Skin is warm and dry.  Neurological:     Mental Status: She is alert and oriented to person, place, and time.      UC Treatments / Results  Labs (all labs ordered are listed, but only abnormal results are displayed) Labs Reviewed - No data to display  EKG None  Radiology No results found.  Procedures Procedures (including critical care time)  Medications Ordered in UC Medications  ketorolac (TORADOL) injection 60 mg (60 mg Intramuscular Given 11/04/18 1800)  dexamethasone (DECADRON) injection 10 mg (10 mg Intramuscular Given 11/04/18 1800)    Initial Impression / Assessment and Plan / UC Course  I have reviewed the triage vital signs and the nursing notes.  Pertinent labs & imaging results that were available during my care of  the patient were reviewed by me and considered in my medical decision making (see chart for details).     Neck pain: Likely cervical strain, will recommend anti-inflammatories and muscle relaxers.  Will provide Toradol and Decadron in clinic prior to discharge.  Will discharge home on increasing ibuprofen use to 600/800 instead of 200 and paring with Tylenol.  Will also provide Flexeril.  Continue to monitor,Discussed strict return precautions. Patient verbalized understanding and is agreeable with plan.  Final Clinical Impressions(s) / UC Diagnoses   Final diagnoses:  Acute strain of neck muscle, initial encounter     Discharge Instructions     We gave you an injection of Toradol and Decadron today- Please hold off on any further ibuprofen until tomorrow morning, may use flexeril tonight  Use anti-inflammatories for pain/swelling. You may take up to 800 mg Ibuprofen every 8 hours with food. You may supplement Ibuprofen with Tylenol [phone] mg every 8 hours.   You may use flexeril as needed to help with pain. This is a muscle relaxer and causes sedation- please use only at bedtime or when you will be home and not have to drive/work- begin with 1 tablet, may increase to 2  Gentle neck stretching  Follow up if symptoms worsening or not improving   ED Prescriptions    Medication Sig Dispense Auth. Provider   cyclobenzaprine (FLEXERIL) 5 MG tablet Take 1-2 tablets (5-10 mg total) by mouth 2 (two) times daily as needed for muscle spasms. 24 tablet Lashonta Pilling C, PA-C   ibuprofen (ADVIL,MOTRIN) 800 MG tablet Take 1 tablet (800 mg total) by mouth 3 (three) times daily. 21 tablet Dory Verd[place], [place] C, PA-C     Controlled Substance Prescriptions  Controlled Substance Registry consulted? Not Applicable   Janith Lima, [place] 11/04/18 1930

## 2018-12-26 ENCOUNTER — Other Ambulatory Visit: Payer: Self-pay

## 2018-12-26 ENCOUNTER — Emergency Department (HOSPITAL_BASED_OUTPATIENT_CLINIC_OR_DEPARTMENT_OTHER)
Admission: EM | Admit: 2018-12-26 | Discharge: 2018-12-26 | Disposition: A | Discharge status: Home / Self Care | Payer: Self-pay | Attending: Emergency Medicine | Admitting: Emergency Medicine

## 2018-12-26 ENCOUNTER — Emergency Department (HOSPITAL_BASED_OUTPATIENT_CLINIC_OR_DEPARTMENT_OTHER): Payer: Self-pay

## 2018-12-26 ENCOUNTER — Encounter (HOSPITAL_BASED_OUTPATIENT_CLINIC_OR_DEPARTMENT_OTHER): Payer: Self-pay

## 2018-12-26 DIAGNOSIS — D649 Anemia, unspecified: Secondary | ICD-10-CM | POA: Insufficient documentation

## 2018-12-26 DIAGNOSIS — R1033 Periumbilical pain: Secondary | ICD-10-CM | POA: Insufficient documentation

## 2018-12-26 LAB — CBC WITH DIFFERENTIAL/PLATELET
Abs Immature Granulocytes: 0.03 10*3/uL (ref 0.00–0.07)
Basophils Absolute: 0 10*3/uL (ref 0.0–0.1)
Basophils Relative: 0 %
Eosinophils Absolute: 0.1 10*3/uL (ref 0.0–0.5)
Eosinophils Relative: 2 %
HCT: 28.7 % — ABNORMAL LOW (ref 36.0–46.0)
Hemoglobin: 7.4 g/dL — ABNORMAL LOW (ref 12.0–15.0)
Immature Granulocytes: 1 %
Lymphocytes Relative: 44 %
Lymphs Abs: 2 10*3/uL (ref 0.7–4.0)
MCH: 17 pg — ABNORMAL LOW (ref 26.0–34.0)
MCHC: 25.8 g/dL — ABNORMAL LOW (ref 30.0–36.0)
MCV: 66 fL — ABNORMAL LOW (ref 80.0–100.0)
Monocytes Absolute: 0.5 10*3/uL (ref 0.1–1.0)
Monocytes Relative: 10 %
Neutro Abs: 2 10*3/uL (ref 1.7–7.7)
Neutrophils Relative %: 43 %
Platelets: 278 10*3/uL (ref 150–400)
RBC: 4.35 MIL/uL (ref 3.87–5.11)
RDW: 18.7 % — ABNORMAL HIGH (ref 11.5–15.5)
Smear Review: NORMAL
WBC: 4.6 10*3/uL (ref 4.0–10.5)
nRBC: 0.4 % — ABNORMAL HIGH (ref 0.0–0.2)

## 2018-12-26 LAB — URINALYSIS, ROUTINE W REFLEX MICROSCOPIC
Bilirubin Urine: NEGATIVE
Glucose, UA: NEGATIVE mg/dL
Hgb urine dipstick: NEGATIVE
Ketones, ur: NEGATIVE mg/dL
Leukocytes,Ua: NEGATIVE
Nitrite: NEGATIVE
Protein, ur: NEGATIVE mg/dL
Specific Gravity, Urine: 1.02 (ref 1.005–1.030)
pH: 8 (ref 5.0–8.0)

## 2018-12-26 LAB — COMPREHENSIVE METABOLIC PANEL
ALT: 6 U/L (ref 0–44)
AST: 11 U/L — ABNORMAL LOW (ref 15–41)
Albumin: 3.6 g/dL (ref 3.5–5.0)
Alkaline Phosphatase: 85 U/L (ref 38–126)
Anion gap: 6 (ref 5–15)
BUN: 11 mg/dL (ref 6–20)
CO2: 26 mmol/L (ref 22–32)
Calcium: 9 mg/dL (ref 8.9–10.3)
Chloride: 106 mmol/L (ref 98–111)
Creatinine, Ser: 0.6 mg/dL (ref 0.44–1.00)
GFR calc Af Amer: >60 mL/min (ref >60)
GFR calc non Af Amer: >60 mL/min (ref >60)
Glucose, Bld: 97 mg/dL (ref 70–99)
Potassium: 4 mmol/L (ref 3.5–5.1)
Sodium: 138 mmol/L (ref 135–145)
Total Bilirubin: 0.3 mg/dL (ref 0.3–1.2)
Total Protein: 7.3 g/dL (ref 6.5–8.1)

## 2018-12-26 LAB — LIPASE, BLOOD: Lipase: 39 U/L (ref 11–51)

## 2018-12-26 LAB — PREGNANCY, URINE: Preg Test, Ur: NEGATIVE

## 2018-12-26 MED ORDER — ONDANSETRON HCL 4 MG/2ML IJ SOLN
4.0000 mg | Freq: Once | INTRAMUSCULAR | Status: AC
  Administered 2018-12-26: 4 mg via INTRAVENOUS
  Filled 2018-12-26: qty 2

## 2018-12-26 MED ORDER — SODIUM CHLORIDE 0.9 % IV BOLUS
1000.0000 mL | Freq: Once | INTRAVENOUS | Status: AC
  Administered 2018-12-26: 1000 mL via INTRAVENOUS

## 2018-12-26 MED ORDER — IOHEXOL 300 MG/ML  SOLN
100.0000 mL | Freq: Once | INTRAMUSCULAR | Status: AC | PRN
  Administered 2018-12-26: 13:00:00 100 mL via INTRAVENOUS

## 2018-12-26 MED ORDER — MORPHINE SULFATE (PF) 4 MG/ML IV SOLN
4.0000 mg | Freq: Once | INTRAVENOUS | Status: AC
  Administered 2018-12-26: 4 mg via INTRAVENOUS
  Filled 2018-12-26: qty 1

## 2018-12-26 MED ORDER — PANTOPRAZOLE SODIUM 20 MG PO TBEC: 20.0000 mg | DELAYED_RELEASE_TABLET | Freq: Every day | ORAL | 0 refills | Status: DC

## 2018-12-26 NOTE — ED Notes (Signed)
Patient transported to CT 

## 2018-12-26 NOTE — Discharge Instructions (Addendum)
Take Protonix daily as prescribed. Take an iron supplement daily, take Colace to help keep stools soft. Follow up with primary care provider for further care- possible referral to GI or GYN.

## 2018-12-26 NOTE — ED Provider Notes (Signed)
[facility] Provider Note   CSN: [id number] Arrival date & time: 12/26/18  1120    History   Chief Complaint Chief Complaint  Patient presents with   Abdominal Pain    HPI Karen Lutz is a 48 y.o. female.     48yo female with past medical history of GERD, anemia, presents with complaint of periumbilical abdominal pain, onset after eating a cheeseburger this morning. Pain is sharp and aching in nature, does not radiate, nothing makes pain better/worse. Associated with nausea, denies vomiting, changes in bowel/bladder habits. Reports similar episode the last time she came to the ER (08/2018 per record).      Past Medical History:  Diagnosis Date   Acid reflux    Anemia    Fibroids    Heart murmur    UTI (lower urinary tract infection)     Patient Active Problem List   Diagnosis Date Noted   Epigastric pain 07/03/2013   Anemia 07/03/2013   Migraine 07/03/2013   Mood disorder ([place]) 02/15/2011   Iron deficiency anemia 02/15/2011   Uterine fibroid 02/15/2011   GERD (gastroesophageal reflux disease) 02/15/2011    Past Surgical History:  Procedure Laterality Date   ceaserian     CESAREAN SECTION     CESAREAN SECTION     KNEE SURGERY     KNEE SURGERY       OB History   No obstetric history on file.      Home Medications    Prior to Admission medications   Medication Sig Start Date End Date Taking? Authorizing Provider  cyclobenzaprine (FLEXERIL) 5 MG tablet Take 1-2 tablets (5-10 mg total) by mouth 2 (two) times daily as needed for muscle spasms. 11/04/18   Wieters, Hallie C, PA-C  ibuprofen (ADVIL,MOTRIN) 800 MG tablet Take 1 tablet (800 mg total) by mouth 3 (three) times daily. 11/04/18   Wieters, Hallie C, PA-C  pantoprazole (PROTONIX) 20 MG tablet Take 1 tablet (20 mg total) by mouth daily for 30 days. 12/26/18 01/25/19  Tacy Learn, PA-C    Family History Family History  Problem Relation Age of  Onset   Hypertension Other    Cancer Other    Alcohol abuse Mother    Heart disease Mother    Alcohol abuse Father    Heart disease Father    Arthritis Maternal Grandmother    Arthritis Maternal Grandfather    Arthritis Paternal Grandmother    Arthritis Paternal Grandfather     Social History Social History   Tobacco Use   Smoking status: Never Smoker   Smokeless tobacco: Never Used  Substance Use Topics   Alcohol use: No   Drug use: No     Allergies   Penicillins and Penicillin g   Review of Systems Review of Systems  Constitutional: Negative for chills and fever.  Respiratory: Negative for shortness of breath.   Cardiovascular: Negative for chest pain.  Gastrointestinal: Positive for nausea. Negative for constipation, diarrhea and vomiting.  Genitourinary: Negative for dysuria, frequency and urgency.  Musculoskeletal: Negative for back pain.  Skin: Negative for rash and wound.  Allergic/Immunologic: Negative for immunocompromised state.  All other systems reviewed and are negative.    Physical Exam Updated Vital Signs BP (!) 134/97 (BP Location: Right Arm)    Pulse 87    Temp 97.8 F (36.6 C) (Oral)    Resp 14    LMP 12/09/2018    SpO2 100%   Physical Exam Vitals signs  and nursing note reviewed.  Constitutional:      General: She is not in acute distress.    Appearance: She is well-developed. She is not diaphoretic.  HENT:     Head: Normocephalic and atraumatic.  Cardiovascular:     Rate and Rhythm: Normal rate and regular rhythm.     Heart sounds: Normal heart sounds.  Pulmonary:     Effort: Pulmonary effort is normal.     Breath sounds: Normal breath sounds.  Abdominal:     Palpations: Abdomen is soft.     Tenderness: There is abdominal tenderness in the epigastric area and periumbilical area. There is no right CVA tenderness or left CVA tenderness. Negative signs include Kaelum Kissick's sign.  Skin:    General: Skin is warm and dry.    Neurological:     Mental Status: She is alert and oriented to person, place, and time.  Psychiatric:        Behavior: Behavior normal.      ED Treatments / Results  Labs (all labs ordered are listed, but only abnormal results are displayed) Labs Reviewed  CBC WITH DIFFERENTIAL/PLATELET - Abnormal; Notable for the following components:      Result Value   Hemoglobin 7.4 (*)    HCT 28.7 (*)    MCV 66.0 (*)    MCH 17.0 (*)    MCHC 25.8 (*)    RDW 18.7 (*)    nRBC 0.4 (*)    All other components within normal limits  COMPREHENSIVE METABOLIC PANEL - Abnormal; Notable for the following components:   AST 11 (*)    All other components within normal limits  LIPASE, BLOOD  PREGNANCY, URINE  URINALYSIS, ROUTINE W REFLEX MICROSCOPIC    EKG None  Radiology Ct Abdomen Pelvis W Contrast  Result Date: 12/26/2018 CLINICAL DATA:  Abdominal pain EXAM: CT ABDOMEN AND PELVIS WITH CONTRAST TECHNIQUE: Multidetector CT imaging of the abdomen and pelvis was performed using the standard protocol following bolus administration of intravenous contrast. CONTRAST:  134mL OMNIPAQUE IOHEXOL 300 MG/ML  SOLN COMPARISON:  09/03/2015 FINDINGS: Lower chest: No acute abnormality. Hepatobiliary: No focal liver abnormality is seen. No gallstones, gallbladder wall thickening, or biliary dilatation. Pancreas: Unremarkable. No pancreatic ductal dilatation or surrounding inflammatory changes. Spleen: Normal in size without focal abnormality. Adrenals/Urinary Tract: Adrenal glands are unremarkable. Kidneys are normal, without renal calculi, focal lesion, or hydronephrosis. Bladder is unremarkable. Stomach/Bowel: Stomach is within normal limits. Appendix appears normal. No evidence of bowel wall thickening, distention, or inflammatory changes. Vascular/Lymphatic: No significant vascular findings are present. No enlarged abdominal or pelvic lymph nodes. Reproductive: Uterus and bilateral adnexa are unremarkable. Other: No  abdominal wall hernia or abnormality. No abdominopelvic ascites. Musculoskeletal: No acute or significant osseous findings. IMPRESSION: 1. No acute abdominal or pelvic pathology. Electronically Signed   By: Kathreen Devoid   On: 12/26/2018 12:51    Procedures Procedures (including critical care time)  Medications Ordered in ED Medications  ondansetron (ZOFRAN) injection 4 mg (4 mg Intravenous Given 12/26/18 1157)  morphine 4 MG/ML injection 4 mg (4 mg Intravenous Given 12/26/18 1157)  sodium chloride 0.9 % bolus 1,000 mL (0 mLs Intravenous Stopped 12/26/18 1323)  iohexol (OMNIPAQUE) 300 MG/ML solution 100 mL (100 mLs Intravenous Contrast Given 12/26/18 1230)     Initial Impression / Assessment and Plan / ED Course  I have reviewed the triage vital signs and the nursing notes.  Pertinent labs & imaging results that were available during my care of the  patient were reviewed by me and considered in my medical decision making (see chart for details).  Clinical Course as of Dec 25 1499  Fri Dec 25, 6009  7978 48 year old female with report of periumbilical abdominal pain onset today after eating a cheeseburger.  Associated with nausea, denies fevers, vomiting, changes in bowel or bladder habits.  Patient reports similar incident last summer she was in the emergency room which was in January.  Review of those records shows normal ultrasound of the right upper quadrant.  On exam patient is tenderness in her epigastric and periumbilical areas, negative Blannie Shedlock sign.  Differential diagnosis includes gastritis, peptic ulcer disease, cholelithiasis, acute cholecystitis.  Patient was given morphine and Zofran for her symptoms with improvement.  Lab work shows normal CMP, lipase within normal limits, CBC with anemia, similar to previous labs on file.  CT abdomen pelvis negative for acute findings.  Discussed results and plan of care with patient, patient is a history of uterine fibroids and heavy vaginal bleeding,  has not consider follow-up with gynecology and does not have a PCP.  Patient given referral to area PCP, recommend that she follow-up with PCP to coordinate care between gynecology and possible gastroenterology referral if her abdominal pain persists.  Patient is given prescription for Protonix.  Patient return to ER for new or worsening symptoms.  Patient verbalizes understanding of discharge instructions and plan.   [LM]    Clinical Course User Index [LM] Tacy Learn, PA-C      Final Clinical Impressions(s) / ED Diagnoses   Final diagnoses:  Periumbilical abdominal pain  Anemia, unspecified type    ED Discharge Orders         Ordered    pantoprazole (PROTONIX) 20 MG tablet  Daily     12/26/18 1309           Roque Lias 12/26/18 1501    Little, Wenda Overland, MD 12/27/18 [phone]

## 2018-12-26 NOTE — ED Triage Notes (Signed)
C/o abd pain started after eating this am-hx of same-states she was seen 2 months ago for same-NAD-steady gait

## 2019-02-10 ENCOUNTER — Ambulatory Visit (HOSPITAL_COMMUNITY)
Admission: EM | Admit: 2019-02-10 | Discharge: 2019-02-10 | Disposition: A | Discharge status: Home / Self Care | Payer: Self-pay | Attending: Emergency Medicine | Admitting: Emergency Medicine

## 2019-02-10 ENCOUNTER — Encounter (HOSPITAL_COMMUNITY): Payer: Self-pay | Admitting: Emergency Medicine

## 2019-02-10 ENCOUNTER — Telehealth (HOSPITAL_COMMUNITY): Payer: Self-pay | Admitting: Emergency Medicine

## 2019-02-10 ENCOUNTER — Other Ambulatory Visit: Payer: Self-pay

## 2019-02-10 DIAGNOSIS — J020 Streptococcal pharyngitis: Secondary | ICD-10-CM

## 2019-02-10 DIAGNOSIS — H9202 Otalgia, left ear: Secondary | ICD-10-CM

## 2019-02-10 LAB — POCT RAPID STREP A: Streptococcus, Group A Screen (Direct): POSITIVE — AB

## 2019-02-10 MED ORDER — IBUPROFEN 600 MG PO TABS: 600.0000 mg | ORAL_TABLET | Freq: Four times a day (QID) | ORAL | 0 refills | Status: DC | PRN

## 2019-02-10 MED ORDER — CYCLOBENZAPRINE HCL 10 MG PO TABS: 10.0000 mg | ORAL_TABLET | Freq: Every day | ORAL | 0 refills | Status: DC

## 2019-02-10 MED ORDER — CEPHALEXIN 500 MG PO CAPS: 500.0000 mg | ORAL_CAPSULE | Freq: Two times a day (BID) | ORAL | 0 refills | Status: DC

## 2019-02-10 MED ORDER — CEPHALEXIN 500 MG PO CAPS: 500.0000 mg | ORAL_CAPSULE | Freq: Two times a day (BID) | ORAL | 0 refills | Status: AC

## 2019-02-10 NOTE — Discharge Instructions (Addendum)
I have decided to go for Keflex as I do not think that you have an inner ear infection.  I think your ear pain is most likely from your temporomandibular joint.  Flexeril at night to help you from grinding your teeth.  1 gram of Tylenol and 600 mg ibuprofen together 3-4 times a day as needed for pain.  Make sure you drink plenty of extra fluids.  Some people find salt water gargles and  Traditional Medicinal's "Throat Coat" tea helpful. Take 5 mL of liquid Benadryl and 5 mL of Maalox. Mix it together, and then hold it in your mouth for as long as you can and then swallow. You may do this 4 times a day.    I have ordered COVID testing for you.  You should be contacted tomorrow and to have it done.  If you do not hear from [place], call here so that we can arrange it for you.  Stay out of work until your cover test is resulted.  This may take up to 3-4 days.  Go to www.goodrx.com to look up your medications. This will give you a list of where you can find your prescriptions at the most affordable prices. Or ask the pharmacist what the cash price is, or if they have any other discount programs available to help make your medication more affordable. This can be less expensive than what you would pay with insurance.   Below is a list of primary care practices who are taking new patients for you to follow-up with.  [facility] at [facility] [address] [place], [place] [address] [phone]  [place], [place] [address] [phone]  [facility] [phone] [place] [address]  [facility]: [place]  [phone]  [place] [facility]: [place]   [phone]  [facility]: [address] [place]  [place]  [phone]  [facility] : [address] [place] [phone]  [facility]: [address] [place] [address] [phone]  [facility]: [place] [phone]  Dr. Blanchie Serve [place]  [phone]  Dr. Benito Mccreedy, [facility]. [place], [place] [address]  [phone]

## 2019-02-10 NOTE — ED Triage Notes (Signed)
Pt here for sore throat and left ear pain

## 2019-02-10 NOTE — ED Provider Notes (Signed)
HPI  SUBJECTIVE:  Patient reports sore throat starting 3 days ago.  Also reports left ear pain starting yesterday.  She reports a cough that is occasionally productive of whitish-greenish phlegm.  No fevers, body aches, headaches, loss of sense of smell or taste.  No vomiting, wheezing, chest pain, shortness of breath, abdominal pain, diarrhea, rash.  No nasal congestion, postnasal drip, GERD or allergy symptoms.  No contacts with strep, mono, COVID.  No voice changes, drooling, trismus.  No recent antibiotics.  No antipyretic in the past 4 to 6 hours.  No change in hearing, otorrhea.  No foreign body insertion into ears, no earbud use.  Ear pain is not associated with chewing, yawning. no recent swimming.  She does grind her teeth at night.  She is a Dietitian, works in a group home.  She has tried Mucinex for the cough with loosening of phlegm.  She states her sore throat is worse with swallowing, eating.  She has a past medical history of GERD, current strep, status post tonsillectomy.  No history of asthma, eczema, COPD, allergies, mono, diabetes, hypertension.  LMP: Last week.  Denies possibility being pregnant.  PMD: None.   Past Medical History:  Diagnosis Date  . Acid reflux   . Anemia   . Fibroids   . Heart murmur   . UTI (lower urinary tract infection)     Past Surgical History:  Procedure Laterality Date  . ceaserian    . CESAREAN SECTION    . CESAREAN SECTION    . KNEE SURGERY    . KNEE SURGERY      Family History  Problem Relation Age of Onset  . Hypertension Other   . Cancer Other   . Alcohol abuse Mother   . Heart disease Mother   . Alcohol abuse Father   . Heart disease Father   . Arthritis Maternal Grandmother   . Arthritis Maternal Grandfather   . Arthritis Paternal Grandmother   . Arthritis Paternal Grandfather     Social History   Tobacco Use  . Smoking status: Never Smoker  . Smokeless tobacco: Never Used  Substance Use Topics  . Alcohol  use: No  . Drug use: No    No current facility-administered medications for this encounter.   Current Outpatient Medications:  .  cephALEXin (KEFLEX) 500 MG capsule, Take 1 capsule (500 mg total) by mouth 2 (two) times daily for 10 days., Disp: 20 capsule, Rfl: 0 .  cyclobenzaprine (FLEXERIL) 10 MG tablet, Take 1 tablet (10 mg total) by mouth at bedtime., Disp: 20 tablet, Rfl: 0 .  ibuprofen (ADVIL) 600 MG tablet, Take 1 tablet (600 mg total) by mouth every 6 (six) hours as needed., Disp: 30 tablet, Rfl: 0 .  pantoprazole (PROTONIX) 20 MG tablet, Take 1 tablet (20 mg total) by mouth daily for 30 days., Disp: 30 tablet, Rfl: 0  Allergies  Allergen Reactions  . Penicillins Rash    Has patient had a PCN reaction causing immediate rash, facial/tongue/throat swelling, SOB or lightheadedness with hypotension: unknown Has patient had a PCN reaction causing severe rash involving mucus membranes or skin necrosis: unknown Has patient had a PCN reaction that required hospitalization : yes Has patient had a PCN reaction occurring within the last 10 years: no If all of the above answers are "NO", then may proceed with Cephalosporin use.   Marland Kitchen Penicillin G Other (See Comments)    Per patient, childhood allergy, reaction unknown.  ROS  As noted in HPI.   Physical Exam  BP 134/75 (BP Location: Right Arm)   Pulse 97   Temp (!) 97.1 F (36.2 C) (Temporal)   Resp 18   SpO2 100%   Constitutional: Well developed, well nourished, no acute distress Eyes:  EOMI, conjunctiva normal bilaterally HENT: Normocephalic, atraumatic,mucus membranes moist.  - nasal congestion +erythematous oropharynx.  Tonsils surgically absent.  No petechiae on palate.  Uvula midline.  Left external ear, external ear canal normal.  Positive tenderness with traction on pinna, palpation of tragus.  Positive tenderness at the left TMJ.  No crepitus.  No tenderness over the mastoid.  TMs normal bilaterally. Respiratory:  Normal inspiratory effort lungs clear bilaterally Cardiovascular: Normal rate, no murmurs, rubs, gallops GI: nondistended, nontender. No appreciable splenomegaly skin: No rash, skin intact Lymph: + Anterior cervical LN.  No posterior cervical lymphadenopathy Musculoskeletal: no deformities Neurologic: Alert & oriented x 3, no focal neuro deficits Psychiatric: Speech and behavior appropriate  ED Course   Medications - No data to display  Orders Placed This Encounter  Procedures  . POCT rapid strep A [facility])    Standing Status:   Standing    Number of Occurrences:   1    Results for orders placed or performed during the hospital encounter of 02/10/19 (from the past 24 hour(s))  POCT rapid strep A [facility])     Status: Abnormal   Collection Time: 02/10/19  7:50 PM  Result Value Ref Range   Streptococcus, Group A Screen (Direct) POSITIVE (A) NEGATIVE   No results found.  ED Clinical Impression  1. Strep pharyngitis   2. Left ear pain      ED Assessment/Plan  Rapid strep faintly positive. Sending home with Keflex for 10 days.  Reports allergy to penicillin but states that she is fine with cephalosporins.  otalgia appears to be from the TMJ.  There is no evidence of otitis externa, mastoiditis, otitis media.  Home with ibuprofen, Tylenol, Benadryl/Maalox mixture.  Also checking for COVID as she is a Dietitian.  Ordered COVID testing through the [facility] community pool.  Advised her that they will contact her in the next several days.  Advised her to stay at home and will write work note until her COVID test is resulted.  Patient to followup with PMD of choice when necessary, will refer to local primary care resources.   Discussed labs,  MDM, plan and followup with patient. Discussed sn/sx that should prompt return to the ED. patient agrees with plan.   Meds ordered this encounter  Medications  . DISCONTD: cyclobenzaprine (FLEXERIL) 10 MG tablet    Sig: Take  1 tablet (10 mg total) by mouth at bedtime.    Dispense:  20 tablet    Refill:  0  . DISCONTD: ibuprofen (ADVIL) 600 MG tablet    Sig: Take 1 tablet (600 mg total) by mouth every 6 (six) hours as needed.    Dispense:  30 tablet    Refill:  0  . DISCONTD: cephALEXin (KEFLEX) 500 MG capsule    Sig: Take 1 capsule (500 mg total) by mouth 2 (two) times daily for 10 days.    Dispense:  20 capsule    Refill:  0     *This clinic note was created using [employer]. Therefore, there may be occasional mistakes despite careful proofreading.     Melynda Ripple, MD 02/10/19 2109

## 2019-02-11 ENCOUNTER — Telehealth: Payer: Self-pay | Admitting: *Deleted

## 2019-02-11 DIAGNOSIS — Z20822 Contact with and (suspected) exposure to covid-19: Secondary | ICD-10-CM

## 2019-02-11 NOTE — Telephone Encounter (Signed)
LM for pt to return call @ [phone] M-F 7a-7p to schedule covid testing. Order placed  

## 2019-02-11 NOTE — Telephone Encounter (Signed)
-----   Message from Melynda Ripple, MD sent at 02/10/2019  7:41 PM EDT ----- Regarding: needs covid testing

## 2019-02-11 NOTE — Telephone Encounter (Signed)
Test scheduled 7.9.20 at 9:30 GV

## 2019-02-12 ENCOUNTER — Other Ambulatory Visit: Payer: Self-pay

## 2019-02-12 DIAGNOSIS — Z20822 Contact with and (suspected) exposure to covid-19: Secondary | ICD-10-CM

## 2019-02-17 LAB — NOVEL CORONAVIRUS, NAA: SARS-CoV-2, NAA: NOT DETECTED

## 2019-03-12 ENCOUNTER — Ambulatory Visit (HOSPITAL_COMMUNITY)
Admission: EM | Admit: 2019-03-12 | Discharge: 2019-03-12 | Disposition: A | Discharge status: Home / Self Care | Payer: Self-pay | Attending: Emergency Medicine | Admitting: Emergency Medicine

## 2019-03-12 ENCOUNTER — Other Ambulatory Visit: Payer: Self-pay

## 2019-03-12 ENCOUNTER — Encounter (HOSPITAL_COMMUNITY): Payer: Self-pay

## 2019-03-12 DIAGNOSIS — M62838 Other muscle spasm: Secondary | ICD-10-CM

## 2019-03-12 DIAGNOSIS — M542 Cervicalgia: Secondary | ICD-10-CM

## 2019-03-12 MED ORDER — PANTOPRAZOLE SODIUM 20 MG PO TBEC: 20.0000 mg | DELAYED_RELEASE_TABLET | Freq: Every day | ORAL | 0 refills | Status: DC

## 2019-03-12 MED ORDER — KETOROLAC TROMETHAMINE 60 MG/2ML IM SOLN
INTRAMUSCULAR | Status: AC
  Filled 2019-03-12: qty 2

## 2019-03-12 MED ORDER — KETOROLAC TROMETHAMINE 60 MG/2ML IM SOLN
60.0000 mg | Freq: Once | INTRAMUSCULAR | Status: AC
  Administered 2019-03-12: 12:00:00 60 mg via INTRAMUSCULAR

## 2019-03-12 MED ORDER — MELOXICAM 15 MG PO TBDP: 1.0000 | ORAL_TABLET | Freq: Every day | ORAL | 0 refills | Status: DC

## 2019-03-12 MED ORDER — ZIKS ARTHRITIS PAIN RELIEF 0.025-1-12 % EX CREA: 1.0000 "application " | TOPICAL_CREAM | Freq: Three times a day (TID) | CUTANEOUS | 0 refills | Status: DC | PRN

## 2019-03-12 NOTE — Discharge Instructions (Signed)
°  Meloxicam (Mobic) is an antiinflammatory to help with pain and inflammation.  Do not take ibuprofen, Advil, Aleve, or any other medications that contain NSAIDs while taking meloxicam as this may cause stomach upset or even ulcers if taken in large amounts for an extended period of time.    Please establish care with a primary care provider for ongoing healthcare needs and recheck of recurrent neck/upper back pain.

## 2019-03-12 NOTE — ED Triage Notes (Signed)
Pt states she has stiffness in her 3 days. Pt states she has tried muscle relaxer and they did not work.

## 2019-03-12 NOTE — ED Provider Notes (Signed)
[name]    CSN: [id number] Arrival date & time: 03/12/19  1050     History   Chief Complaint Chief Complaint  Patient presents with  . Neck Pain    HPI Karen Lutz is a 48 y.o. female.   HPI Karen Lutz is a 48 y.o. female presenting to UC with c/o 3 days of gradually worsening Left side neck pain and stiffness. She has tried a previously prescribed muscle relaxer, Flexeril, w/o relief.  Denies known injury. Pt woke up with the pain the other morning. Hx of neck soreness/stiffness in the past.  Denies radiation of pain or numbness in her arms or legs.    Past Medical History:  Diagnosis Date  . Acid reflux   . Anemia   . Fibroids   . Heart murmur   . UTI (lower urinary tract infection)     Patient Active Problem List   Diagnosis Date Noted  . Epigastric pain 07/03/2013  . Anemia 07/03/2013  . Migraine 07/03/2013  . Mood disorder ([place]) 02/15/2011  . Iron deficiency anemia 02/15/2011  . Uterine fibroid 02/15/2011  . GERD (gastroesophageal reflux disease) 02/15/2011    Past Surgical History:  Procedure Laterality Date  . ceaserian    . CESAREAN SECTION    . CESAREAN SECTION    . KNEE SURGERY    . KNEE SURGERY      OB History   No obstetric history on file.      Home Medications    Prior to Admission medications   Medication Sig Start Date End Date Taking? Authorizing Provider  Capsaicin-Menthol-Methyl Sal (CAPSAICIN-METHYL SAL-MENTHOL) 0.025-1-12 % CREA Apply 1 application topically 3 (three) times daily as needed (as needed for muscle pain). 03/12/19   Noe Gens, PA-C  cyclobenzaprine (FLEXERIL) 10 MG tablet Take 1 tablet (10 mg total) by mouth at bedtime. 02/10/19   Melynda Ripple, MD  ibuprofen (ADVIL) 600 MG tablet Take 1 tablet (600 mg total) by mouth every 6 (six) hours as needed. 02/10/19   Melynda Ripple, MD  Meloxicam 15 MG TBDP Take 1 tablet by mouth daily. As needed for pain 03/12/19 04/11/19  Noe Gens, PA-C   pantoprazole (PROTONIX) 20 MG tablet Take 1 tablet (20 mg total) by mouth daily. 03/12/19 04/11/19  Noe Gens, PA-C    Family History Family History  Problem Relation Age of Onset  . Hypertension Other   . Cancer Other   . Alcohol abuse Mother   . Heart disease Mother   . Alcohol abuse Father   . Heart disease Father   . Arthritis Maternal Grandmother   . Arthritis Maternal Grandfather   . Arthritis Paternal Grandmother   . Arthritis Paternal Grandfather     Social History Social History   Tobacco Use  . Smoking status: Never Smoker  . Smokeless tobacco: Never Used  Substance Use Topics  . Alcohol use: No  . Drug use: No     Allergies   Penicillins and Penicillin g   Review of Systems Review of Systems  Constitutional: Negative for chills and fever.  Musculoskeletal: Positive for myalgias, neck pain and neck stiffness. Negative for arthralgias.  Skin: Negative for color change and rash.  Neurological: Negative for weakness and numbness.     Physical Exam Triage Vital Signs ED Triage Vitals  Enc Vitals Group     BP 03/12/19 1157 129/71     Pulse Rate 03/12/19 1157 86     Resp 03/12/19  1157 20     Temp --      Temp src --      SpO2 03/12/19 1157 100 %     Weight 03/12/19 1155 285 lb (129.3 kg)     Height --      Head Circumference --      Peak Flow --      Pain Score 03/12/19 1154 10     Pain Loc --      Pain Edu? --      Excl. in [place]? --    No data found.  Updated Vital Signs BP 129/71 (BP Location: Right Arm)   Pulse 86   Resp 20   Wt 285 lb (129.3 kg)   LMP 03/04/2019   SpO2 100%   BMI 44.64 kg/m   Visual Acuity Right Eye Distance:   Left Eye Distance:   Bilateral Distance:    Right Eye Near:   Left Eye Near:    Bilateral Near:     Physical Exam Vitals signs and nursing note reviewed.  Constitutional:      Appearance: Normal appearance. She is well-developed.  HENT:     Head: Normocephalic and atraumatic.     Mouth/Throat:      Mouth: Mucous membranes are moist.  Neck:     Musculoskeletal: Normal range of motion and neck supple. Muscular tenderness present. No spinous process tenderness.      Comments: No spinal tenderness Tenderness to Left side cervical muscles Cardiovascular:     Rate and Rhythm: Normal rate.     Pulses:          Radial pulses are 2+ on the left side.     Heart sounds: Murmur present. Decrescendo  systolic murmur present.  Pulmonary:     Effort: Pulmonary effort is normal. No respiratory distress.  Musculoskeletal: Normal range of motion.        General: Tenderness present.     Comments: No spinal tenderness. Full ROM upper and lower extremities bilaterally with 5/5 grip strength  Skin:    General: Skin is warm and dry.  Neurological:     Mental Status: She is alert and oriented to person, place, and time.  Psychiatric:        Behavior: Behavior normal.      UC Treatments / Results  Labs (all labs ordered are listed, but only abnormal results are displayed) Labs Reviewed - No data to display  EKG   Radiology No results found.  Procedures Procedures (including critical care time)  Medications Ordered in UC Medications  ketorolac (TORADOL) injection 60 mg (60 mg Intramuscular Given 03/12/19 1220)  ketorolac (TORADOL) 60 MG/2ML injection (has no administration in time range)    Initial Impression / Assessment and Plan / UC Course  I have reviewed the triage vital signs and the nursing notes.  Pertinent labs & imaging results that were available during my care of the patient were reviewed by me and considered in my medical decision making (see chart for details).     Hx and exam c/w muscle strain and spasm Will tx conservatively Pt has been on protonix in the past, will refill due to pt being started on Meloxicam in hopes of preventing GI upset or ulcers F/u with PCP AVS provided.  Final Clinical Impressions(s) / UC Diagnoses   Final diagnoses:  Neck muscle spasm   Neck pain on left side     Discharge Instructions      Meloxicam (Mobic) is an  antiinflammatory to help with pain and inflammation.  Do not take ibuprofen, Advil, Aleve, or any other medications that contain NSAIDs while taking meloxicam as this may cause stomach upset or even ulcers if taken in large amounts for an extended period of time.    Please establish care with a primary care provider for ongoing healthcare needs and recheck of recurrent neck/upper back pain.    ED Prescriptions    Medication Sig Dispense Auth. Provider   pantoprazole (PROTONIX) 20 MG tablet Take 1 tablet (20 mg total) by mouth daily. 30 tablet Noe Gens, PA-C   Meloxicam 15 MG TBDP Take 1 tablet by mouth daily. As needed for pain 30 tablet Gerarda Fraction, Janiece Scovill O, PA-C   Capsaicin-Menthol-Methyl Sal (CAPSAICIN-METHYL SAL-MENTHOL) 0.025-1-12 % CREA Apply 1 application topically 3 (three) times daily as needed (as needed for muscle pain). 56.6 g Noe Gens, PA-C     Controlled Substance Prescriptions [place] Controlled Substance Registry consulted? Not Applicable   Tyrell Antonio 03/12/19 2005

## 2019-03-13 ENCOUNTER — Telehealth (HOSPITAL_COMMUNITY): Payer: Self-pay | Admitting: Emergency Medicine

## 2019-03-13 MED ORDER — MELOXICAM 15 MG PO TBDP: 1.0000 | ORAL_TABLET | Freq: Every day | ORAL | 0 refills | Status: AC

## 2019-03-13 NOTE — Telephone Encounter (Signed)
Pt called stating [facility] didn't have the medication she had been prescribed at her visit yesterday; pt informed could send to another pharmacy but could not change medication; pt verbalized understanding and meloxicam RX sent to  [facility]

## 2019-05-18 ENCOUNTER — Encounter (HOSPITAL_COMMUNITY): Payer: Self-pay | Admitting: Emergency Medicine

## 2019-05-18 ENCOUNTER — Other Ambulatory Visit: Payer: Self-pay

## 2019-05-18 ENCOUNTER — Ambulatory Visit (HOSPITAL_COMMUNITY)
Admission: EM | Admit: 2019-05-18 | Discharge: 2019-05-18 | Disposition: A | Discharge status: Home / Self Care | Payer: Self-pay | Attending: Family Medicine | Admitting: Family Medicine

## 2019-05-18 ENCOUNTER — Ambulatory Visit (INDEPENDENT_AMBULATORY_CARE_PROVIDER_SITE_OTHER): Payer: Self-pay

## 2019-05-18 DIAGNOSIS — W19XXXA Unspecified fall, initial encounter: Secondary | ICD-10-CM

## 2019-05-18 DIAGNOSIS — W108XXA Fall (on) (from) other stairs and steps, initial encounter: Secondary | ICD-10-CM

## 2019-05-18 DIAGNOSIS — M25561 Pain in right knee: Secondary | ICD-10-CM

## 2019-05-18 MED ORDER — IBUPROFEN 600 MG PO TABS: 600.0000 mg | ORAL_TABLET | Freq: Four times a day (QID) | ORAL | 0 refills | Status: DC | PRN

## 2019-05-18 MED ORDER — KETOROLAC TROMETHAMINE 30 MG/ML IJ SOLN
30.0000 mg | Freq: Once | INTRAMUSCULAR | Status: AC
  Administered 2019-05-18: 21:00:00 30 mg via INTRAMUSCULAR

## 2019-05-18 MED ORDER — KETOROLAC TROMETHAMINE 30 MG/ML IJ SOLN
INTRAMUSCULAR | Status: AC
  Filled 2019-05-18: qty 1

## 2019-05-18 NOTE — Discharge Instructions (Addendum)
Take ibuprofen 3 times a day with food Ice to area Limit walking while knee is painful Call [facility] to be seen if you do not improve.  They have a walk-in clinic evenings, that appointment

## 2019-05-18 NOTE — ED Provider Notes (Signed)
[place]    CSN: [id number] Arrival date & time: 05/18/19  1850      History   Chief Complaint Chief Complaint  Patient presents with  . Fall  . Knee Pain    HPI Karen Lutz is a 48 y.o. female.   HPI\30, Patient states she slipped on wet steps and fell forward and landed on her right knee.  Very painful.  She states that she went back into her apartment and tried to rest.  She states that every time she tried to move around the bed the knee feels like it is going to "slip out".  She has pain with weightbearing.  She is here in a wheelchair.  She states she is never had problems with his knee before.  She states when it first happened she thought there was something wrong with her knee, she states that her kneecap looked "off".  Now it looks normal but is very painful.  Not swelling.  No discoloration.  No other injury from the fall.  Past Medical History:  Diagnosis Date  . Acid reflux   . Anemia   . Fibroids   . Heart murmur   . UTI (lower urinary tract infection)     Patient Active Problem List   Diagnosis Date Noted  . Epigastric pain 07/03/2013  . Anemia 07/03/2013  . Migraine 07/03/2013  . Mood disorder ([place]) 02/15/2011  . Iron deficiency anemia 02/15/2011  . Uterine fibroid 02/15/2011  . GERD (gastroesophageal reflux disease) 02/15/2011    Past Surgical History:  Procedure Laterality Date  . ceaserian    . CESAREAN SECTION    . CESAREAN SECTION    . KNEE SURGERY    . KNEE SURGERY      OB History   No obstetric history on file.      Home Medications    Prior to Admission medications   Medication Sig Start Date End Date Taking? Authorizing Provider  Capsaicin-Menthol-Methyl Sal (CAPSAICIN-METHYL SAL-MENTHOL) 0.025-1-12 % CREA Apply 1 application topically 3 (three) times daily as needed (as needed for muscle pain). 03/12/19   Noe Gens, PA-C  cyclobenzaprine (FLEXERIL) 10 MG tablet Take 1 tablet (10 mg total) by mouth at  bedtime. 02/10/19   Melynda Ripple, MD  ibuprofen (ADVIL) 600 MG tablet Take 1 tablet (600 mg total) by mouth every 6 (six) hours as needed. 05/18/19   Raylene Everts, MD  pantoprazole (PROTONIX) 20 MG tablet Take 1 tablet (20 mg total) by mouth daily. 03/12/19 04/11/19  Noe Gens, PA-C    Family History Family History  Problem Relation Age of Onset  . Hypertension Other   . Cancer Other   . Alcohol abuse Mother   . Heart disease Mother   . Alcohol abuse Father   . Heart disease Father   . Arthritis Maternal Grandmother   . Arthritis Maternal Grandfather   . Arthritis Paternal Grandmother   . Arthritis Paternal Grandfather     Social History Social History   Tobacco Use  . Smoking status: Never Smoker  . Smokeless tobacco: Never Used  Substance Use Topics  . Alcohol use: No  . Drug use: No     Allergies   Penicillins and Penicillin g   Review of Systems Review of Systems  Constitutional: Negative for chills and fever.  HENT: Negative for ear pain and sore throat.   Eyes: Negative for pain and visual disturbance.  Respiratory: Negative for cough and shortness of breath.  Cardiovascular: Negative for chest pain and palpitations.  Gastrointestinal: Negative for abdominal pain and vomiting.  Genitourinary: Negative for dysuria and hematuria.  Musculoskeletal: Positive for arthralgias and gait problem. Negative for back pain.  Skin: Negative for color change and rash.  Neurological: Negative for seizures and syncope.  All other systems reviewed and are negative.    Physical Exam Triage Vital Signs ED Triage Vitals  Enc Vitals Group     BP 05/18/19 1934 134/82     Pulse Rate 05/18/19 1934 81     Resp 05/18/19 1934 18     Temp 05/18/19 1934 98.3 F (36.8 C)     Temp Source 05/18/19 1934 Oral     SpO2 05/18/19 1934 100 %     Weight --      Height --      Head Circumference --      Peak Flow --      Pain Score 05/18/19 1935 9     Pain Loc --       Pain Edu? --      Excl. in [place]? --    No data found.  Updated Vital Signs BP 134/82 (BP Location: Right Arm)   Pulse 81   Temp 98.3 F (36.8 C) (Oral)   Resp 18   LMP 05/11/2019   SpO2 100%      Physical Exam Constitutional:      General: She is in acute distress.     Appearance: She is well-developed.     Comments: Patient is obese.  Appears uncomfortable.  Sits in wheelchair with right leg extended.  Resists full flexion.  HENT:     Head: Normocephalic and atraumatic.  Eyes:     Conjunctiva/sclera: Conjunctivae normal.     Pupils: Pupils are equal, round, and reactive to light.  Neck:     Musculoskeletal: Normal range of motion.  Cardiovascular:     Rate and Rhythm: Normal rate.  Pulmonary:     Effort: Pulmonary effort is normal. No respiratory distress.  Abdominal:     General: There is no distension.     Palpations: Abdomen is soft.  Musculoskeletal: Normal range of motion.     Comments: Right knee has tenderness along the medial joint line and over the medial tibial plateau.  No tenderness over the patella.  Mild tenderness over the lateral joint line.  Mild tenderness over the posterior knee.  She allowed me to extend the knee passively to almost full.  Will flex only to 90 degrees.  Does not tolerate instability exam, but do not discern instability or drawer sign.  No discoloration.  Skin:    General: Skin is warm and dry.  Neurological:     Mental Status: She is alert.      UC Treatments / Results  Labs (all labs ordered are listed, but only abnormal results are displayed) Labs Reviewed - No data to display  EKG   Radiology Dg Knee Ap/lat W/sunrise Right  Result Date: 05/18/2019 CLINICAL DATA:  Fall and landed on the knee pain at the patella EXAM: RIGHT KNEE 3 VIEWS COMPARISON:  01/17/2015 FINDINGS: No fracture or malalignment. Mild patellofemoral, medial and lateral joint space degenerative change. Small moderate knee effusion. Generalized soft tissue  edema about the knee IMPRESSION: 1. No acute osseous abnormality 2. Arthritis of the knee with small moderate knee effusion Electronically Signed   By: Donavan Foil M.D.   On: 05/18/2019 21:14    Procedures Procedures (including critical care  time)  Medications Ordered in UC Medications  ketorolac (TORADOL) 30 MG/ML injection 30 mg (30 mg Intramuscular Given 05/18/19 2031)  ketorolac (TORADOL) 30 MG/ML injection (has no administration in time range)    Initial Impression / Assessment and Plan / UC Course  I have reviewed the triage vital signs and the nursing notes.  Pertinent labs & imaging results that were available during my care of the patient were reviewed by me and considered in my medical decision making (see chart for details).     No fracture.  Rest, ice, elevate. Brace applied for stability. Final Clinical Impressions(s) / UC Diagnoses   Final diagnoses:  Acute pain of right knee  Fall, initial encounter     Discharge Instructions     Take ibuprofen 3 times a day with food Ice to area Limit walking while knee is painful Call [facility] to be seen if you do not improve.  They have a walk-in clinic evenings, that appointment    ED Prescriptions    Medication Sig Dispense Auth. Provider   ibuprofen (ADVIL) 600 MG tablet Take 1 tablet (600 mg total) by mouth every 6 (six) hours as needed. 30 tablet Raylene Everts, MD     PDMP not reviewed this encounter.   Raylene Everts, MD 05/19/19 [phone]

## 2019-05-18 NOTE — ED Triage Notes (Signed)
Pt here for fall today with right knee pain

## 2019-06-25 ENCOUNTER — Ambulatory Visit (INDEPENDENT_AMBULATORY_CARE_PROVIDER_SITE_OTHER): Payer: Self-pay

## 2019-06-25 ENCOUNTER — Other Ambulatory Visit: Payer: Self-pay

## 2019-06-25 ENCOUNTER — Encounter (HOSPITAL_COMMUNITY): Payer: Self-pay

## 2019-06-25 ENCOUNTER — Ambulatory Visit (HOSPITAL_COMMUNITY)
Admission: EM | Admit: 2019-06-25 | Discharge: 2019-06-25 | Disposition: A | Discharge status: Home / Self Care | Payer: Self-pay | Attending: Emergency Medicine | Admitting: Emergency Medicine

## 2019-06-25 DIAGNOSIS — S60042A Contusion of left ring finger without damage to nail, initial encounter: Secondary | ICD-10-CM

## 2019-06-25 NOTE — Discharge Instructions (Signed)
Will begin ROM  ]take motrin to help with pain and swelling  Keep ring off finger for a few days to heal May use ice and heat to help with swelling every few hours.

## 2019-06-25 NOTE — ED Provider Notes (Addendum)
[place]    CSN: [id number] Arrival date & time: 06/25/19  1650      History   Chief Complaint Chief Complaint  Patient presents with  . Finger Injury    RING FINGER(LEFT HAND)    HPI Karen Lutz is a 48 y.o. female.   Pt states on tues she injured her lt finger by pushing something. States that it has been swelling some and not able to bend. Pt wants to make sure it is not broke. Has not taken anything pta.      Past Medical History:  Diagnosis Date  . Acid reflux   . Anemia   . Fibroids   . Heart murmur   . UTI (lower urinary tract infection)     Patient Active Problem List   Diagnosis Date Noted  . Epigastric pain 07/03/2013  . Anemia 07/03/2013  . Migraine 07/03/2013  . Mood disorder ([place]) 02/15/2011  . Iron deficiency anemia 02/15/2011  . Uterine fibroid 02/15/2011  . GERD (gastroesophageal reflux disease) 02/15/2011    Past Surgical History:  Procedure Laterality Date  . ceaserian    . CESAREAN SECTION    . CESAREAN SECTION    . KNEE SURGERY    . KNEE SURGERY      OB History   No obstetric history on file.      Home Medications    Prior to Admission medications   Medication Sig Start Date End Date Taking? Authorizing Provider  Capsaicin-Menthol-Methyl Sal (CAPSAICIN-METHYL SAL-MENTHOL) 0.025-1-12 % CREA Apply 1 application topically 3 (three) times daily as needed (as needed for muscle pain). 03/12/19   Noe Gens, PA-C  cyclobenzaprine (FLEXERIL) 10 MG tablet Take 1 tablet (10 mg total) by mouth at bedtime. 02/10/19   Melynda Ripple, MD  ibuprofen (ADVIL) 600 MG tablet Take 1 tablet (600 mg total) by mouth every 6 (six) hours as needed. 05/18/19   Raylene Everts, MD  pantoprazole (PROTONIX) 20 MG tablet Take 1 tablet (20 mg total) by mouth daily. 03/12/19 04/11/19  Noe Gens, PA-C    Family History Family History  Problem Relation Age of Onset  . Hypertension Other   . Cancer Other   . Alcohol abuse Mother    . Heart disease Mother   . Alcohol abuse Father   . Heart disease Father   . Arthritis Maternal Grandmother   . Arthritis Maternal Grandfather   . Arthritis Paternal Grandmother   . Arthritis Paternal Grandfather     Social History Social History   Tobacco Use  . Smoking status: Never Smoker  . Smokeless tobacco: Never Used  Substance Use Topics  . Alcohol use: No  . Drug use: No     Allergies   Penicillins and Penicillin g   Review of Systems Review of Systems  Respiratory: Negative.   Cardiovascular: Negative.   Gastrointestinal: Negative.   Musculoskeletal: Positive for joint swelling.       LRF   Skin:       Swelling to LRf small amount ,     Physical Exam Triage Vital Signs ED Triage Vitals  Enc Vitals Group     BP 06/25/19 1708 109/70     Pulse Rate 06/25/19 1708 86     Resp 06/25/19 1708 20     Temp 06/25/19 1708 98.5 F (36.9 C)     Temp Source 06/25/19 1708 Oral     SpO2 06/25/19 1708 99 %     Weight 06/25/19  1708 280 lb (127 kg)     Height --      Head Circumference --      Peak Flow --      Pain Score 06/25/19 1707 8     Pain Loc --      Pain Edu? --      Excl. in [place]? --    No data found.  Updated Vital Signs BP 109/70 (BP Location: Right Arm)   Pulse 86   Temp 98.5 F (36.9 C) (Oral)   Resp 20   Wt 280 lb (127 kg)   LMP 06/13/2019 (Exact Date)   SpO2 99%   BMI 43.85 kg/m   Visual Acuity     Physical Exam Cardiovascular:     Rate and Rhythm: Normal rate.  Pulmonary:     Effort: Pulmonary effort is normal.  Musculoskeletal:        General: Swelling, tenderness and signs of injury present.     Comments: Warm to touch, small amount of edema to pip joint, small movement, making a fist, pt took rings off   Skin:    Findings: Bruising present.  Neurological:     Mental Status: She is alert.      UC Treatments / Results  Labs (all labs ordered are listed, but only abnormal results are displayed) Labs Reviewed - No data  to display  EKG   Radiology Dg Finger Ring Left  Result Date: 06/25/2019 CLINICAL DATA:  Pain of the ring finger over the last 2 days. Swelling. No known injury. EXAM: LEFT RING FINGER 2+V COMPARISON:  None. FINDINGS: Nonspecific soft tissue swelling. No evidence of fracture, dislocation or arthropathy. IMPRESSION: Nonspecific soft tissue swelling. Electronically Signed   By: Nelson Chimes M.D.   On: 06/25/2019 18:57    Procedures Procedures (including critical care time)  Medications Ordered in UC Medications - No data to display  Initial Impression / Assessment and Plan / UC Course  I have reviewed the triage vital signs and the nursing notes.  Pertinent labs & imaging results that were available during my care of the patient were reviewed by me and considered in my medical decision making (see chart for details).     Will begin ROM  ]take motrin to help with pain and swelling  Keep ring off finger for a few days to heal May use ice and heat to help with swelling every few hours.  We discussed placing a spkint and pt felt that was not needed   Final Clinical Impressions(s) / UC Diagnoses   Final diagnoses:  Contusion of left ring finger without damage to nail, initial encounter     Discharge Instructions     Will begin ROM  ]take motrin to help with pain and swelling  Keep ring off finger for a few days to heal May use ice and heat to help with swelling every few hours.      ED Prescriptions    None     PDMP not reviewed this encounter.   Marney Setting, NP 06/25/19 1905    Marney Setting, NP 06/25/19 1909

## 2019-06-25 NOTE — ED Triage Notes (Addendum)
Pt. States her ring finger on her left hand is swollen & hurts to touch it. Pain is a 10 when bending it/activity/touch it. States there was an incident but immediately stopped talking about it.

## 2019-09-07 ENCOUNTER — Encounter (HOSPITAL_COMMUNITY): Payer: Self-pay

## 2019-09-07 ENCOUNTER — Other Ambulatory Visit: Payer: Self-pay

## 2019-09-07 ENCOUNTER — Ambulatory Visit (INDEPENDENT_AMBULATORY_CARE_PROVIDER_SITE_OTHER): Payer: Self-pay

## 2019-09-07 ENCOUNTER — Ambulatory Visit (HOSPITAL_COMMUNITY)
Admission: EM | Admit: 2019-09-07 | Discharge: 2019-09-07 | Disposition: A | Discharge status: Home / Self Care | Payer: Self-pay | Attending: Family Medicine | Admitting: Family Medicine

## 2019-09-07 DIAGNOSIS — N898 Other specified noninflammatory disorders of vagina: Secondary | ICD-10-CM | POA: Insufficient documentation

## 2019-09-07 DIAGNOSIS — M5441 Lumbago with sciatica, right side: Secondary | ICD-10-CM | POA: Insufficient documentation

## 2019-09-07 DIAGNOSIS — M549 Dorsalgia, unspecified: Secondary | ICD-10-CM

## 2019-09-07 DIAGNOSIS — M5442 Lumbago with sciatica, left side: Secondary | ICD-10-CM | POA: Insufficient documentation

## 2019-09-07 LAB — POCT URINALYSIS DIP (DEVICE)
Bilirubin Urine: NEGATIVE
Glucose, UA: NEGATIVE mg/dL
Hgb urine dipstick: NEGATIVE
Ketones, ur: NEGATIVE mg/dL
Nitrite: NEGATIVE
Protein, ur: NEGATIVE mg/dL
Specific Gravity, Urine: 1.02 (ref 1.005–1.030)
Urobilinogen, UA: 0.2 mg/dL (ref 0.0–1.0)
pH: 6 (ref 5.0–8.0)

## 2019-09-07 MED ORDER — CYCLOBENZAPRINE HCL 10 MG PO TABS: 10.0000 mg | ORAL_TABLET | Freq: Two times a day (BID) | ORAL | 0 refills | Status: DC | PRN

## 2019-09-07 MED ORDER — FLUCONAZOLE 150 MG PO TABS: 150.0000 mg | ORAL_TABLET | Freq: Every day | ORAL | 0 refills | Status: DC

## 2019-09-07 MED ORDER — PREDNISONE 10 MG (21) PO TBPK: ORAL_TABLET | ORAL | 0 refills | Status: DC

## 2019-09-07 NOTE — Discharge Instructions (Addendum)
Treating you for yeast infection with diflucan. Take one pill now and one in 3 days if still having symptoms.  Prednisone taper over the next 6 days for pain and inflammation Flexeril for muscle relaxant Toradol given here for pain.  Follow up as needed for continued or worsening symptoms

## 2019-09-07 NOTE — ED Triage Notes (Signed)
Patient presents to Urgent Care with complaints of lower back pain since 3 days ago. Patient reports she has not had urinary burning or frequency but did feel some itching this morning.

## 2019-09-08 LAB — URINE CULTURE: Culture: <10000 — AB

## 2019-09-08 NOTE — ED Provider Notes (Signed)
[place]    CSN: [id number] Arrival date & time: 09/07/19  1417      History   Chief Complaint Chief Complaint  Patient presents with  . Back Pain    HPI Karen Lutz is a 49 y.o. female.   Patient is a 49 year old female with past medical history of acid reflux, anemia, fibroids, heart murmur, UTI.  She presents today with approximate 3 days of lower back discomfort that is generalized.  Symptoms have been constant.  She has been taking ibuprofen and Tylenol without much relief of her pain.  Reporting the pain radiates to both upper anterior thighs.  No numbness or tingling.  No saddle paresthesias, loss of bowel or bladder function.  She does have some dysuria along with vaginal itching and irritation.  Feels as if she may have a yeast infection.  No fever, chills, body aches.  ROS per HPI      Past Medical History:  Diagnosis Date  . Acid reflux   . Anemia   . Fibroids   . Heart murmur   . UTI (lower urinary tract infection)     Patient Active Problem List   Diagnosis Date Noted  . Epigastric pain 07/03/2013  . Anemia 07/03/2013  . Migraine 07/03/2013  . Mood disorder ([place]) 02/15/2011  . Iron deficiency anemia 02/15/2011  . Uterine fibroid 02/15/2011  . GERD (gastroesophageal reflux disease) 02/15/2011    Past Surgical History:  Procedure Laterality Date  . ceaserian    . CESAREAN SECTION    . CESAREAN SECTION    . KNEE SURGERY    . KNEE SURGERY      OB History   No obstetric history on file.      Home Medications    Prior to Admission medications   Medication Sig Start Date End Date Taking? Authorizing Provider  Capsaicin-Menthol-Methyl Sal (CAPSAICIN-METHYL SAL-MENTHOL) 0.025-1-12 % CREA Apply 1 application topically 3 (three) times daily as needed (as needed for muscle pain). 03/12/19   Noe Gens, PA-C  cyclobenzaprine (FLEXERIL) 10 MG tablet Take 1 tablet (10 mg total) by mouth at bedtime. 02/10/19   Melynda Ripple, MD    cyclobenzaprine (FLEXERIL) 10 MG tablet Take 1 tablet (10 mg total) by mouth 2 (two) times daily as needed for muscle spasms. 09/07/19   Loura Halt A, NP  fluconazole (DIFLUCAN) 150 MG tablet Take 1 tablet (150 mg total) by mouth daily. 09/07/19   Loura Halt A, NP  ibuprofen (ADVIL) 600 MG tablet Take 1 tablet (600 mg total) by mouth every 6 (six) hours as needed. 05/18/19   Raylene Everts, MD  pantoprazole (PROTONIX) 20 MG tablet Take 1 tablet (20 mg total) by mouth daily. 03/12/19 04/11/19  Noe Gens, PA-C  predniSONE (STERAPRED UNI-PAK 21 TAB) 10 MG (21) TBPK tablet 6 tabs for 1 day, then 5 tabs for 1 das, then 4 tabs for 1 day, then 3 tabs for 1 day, 2 tabs for 1 day, then 1 tab for 1 day 09/07/19   Orvan July, NP    Family History Family History  Problem Relation Age of Onset  . Hypertension Other   . Cancer Other   . Alcohol abuse Mother   . Heart disease Mother   . Alcohol abuse Father   . Heart disease Father   . Arthritis Maternal Grandmother   . Arthritis Maternal Grandfather   . Arthritis Paternal Grandmother   . Arthritis Paternal Grandfather  Social History Social History   Tobacco Use  . Smoking status: Never Smoker  . Smokeless tobacco: Never Used  Substance Use Topics  . Alcohol use: No  . Drug use: No     Allergies   Penicillins and Penicillin g   Review of Systems Review of Systems   Physical Exam Triage Vital Signs ED Triage Vitals  Enc Vitals Group     BP 09/07/19 1540 (!) 121/57     Pulse Rate 09/07/19 1540 84     Resp 09/07/19 1540 20     Temp 09/07/19 1540 98.2 F (36.8 C)     Temp Source 09/07/19 1540 Oral     SpO2 09/07/19 1540 100 %     Weight --      Height --      Head Circumference --      Peak Flow --      Pain Score 09/07/19 1539 10     Pain Loc --      Pain Edu? --      Excl. in [place]? --    No data found.  Updated Vital Signs BP (!) 121/57 (BP Location: Right Arm)   Pulse 84   Temp 98.2 F (36.8 C) (Oral)    Resp 20   SpO2 100%   Visual Acuity Right Eye Distance:   Left Eye Distance:   Bilateral Distance:    Right Eye Near:   Left Eye Near:    Bilateral Near:     Physical Exam Vitals and nursing note reviewed.  Constitutional:      General: She is not in acute distress.    Appearance: She is obese. She is not ill-appearing, toxic-appearing or diaphoretic.  HENT:     Head: Normocephalic and atraumatic.     Nose: Nose normal.  Eyes:     Conjunctiva/sclera: Conjunctivae normal.  Pulmonary:     Effort: Pulmonary effort is normal.  Musculoskeletal:     Cervical back: Normal range of motion.     Lumbar back: Tenderness and bony tenderness present. No swelling, deformity, lacerations or spasms. Decreased range of motion.       Back:  Skin:    General: Skin is warm and dry.     Findings: No rash.  Neurological:     Mental Status: She is alert.  Psychiatric:        Mood and Affect: Mood normal.      UC Treatments / Results  Labs (all labs ordered are listed, but only abnormal results are displayed) Labs Reviewed  POCT URINALYSIS DIP (DEVICE) - Abnormal; Notable for the following components:      Result Value   Leukocytes,Ua TRACE (*)    All other components within normal limits  URINE CULTURE    EKG   Radiology DG Lumbar Spine Complete  Result Date: 09/07/2019 CLINICAL DATA:  Low back pain since 3 days ago.  No known injury. EXAM: LUMBAR SPINE - COMPLETE 4+ VIEW COMPARISON:  10/29/2003 FINDINGS: There is no evidence of lumbar spine fracture. Alignment is normal. Intervertebral disc spaces are maintained. Left facet arthropathy at L4-5 and L5-S1. IMPRESSION: 1. No acute osseous injury of the lumbar spine. 2. Left facet arthropathy at L4-5 and L5-S1. Electronically Signed   By: Kathreen Devoid   On: 09/07/2019 17:02    Procedures Procedures (including critical care time)  Medications Ordered in UC Medications - No data to display  Initial Impression / Assessment and  Plan / UC Course  I have reviewed the triage vital signs and the nursing notes.  Pertinent labs & imaging results that were available during my care of the patient were reviewed by me and considered in my medical decision making (see chart for details).     Acute midline low back pain with bilateral sciatica.-Lumbar spine x-ray with L4-L5 and L5-S1 arthropathy No acute abnormalities. We will treat patient with prednisone taper over the next 6 days for pain, inflammation and Flexeril for muscle relaxant Toradol given here for pain and inflammation Follow up with primary care as needed.   Vaginal itching irritation.-Treating for yeast infection with Diflucan  Final Clinical Impressions(s) / UC Diagnoses   Final diagnoses:  Vaginal itching  Acute midline low back pain with bilateral sciatica     Discharge Instructions     Treating you for yeast infection with diflucan. Take one pill now and one in 3 days if still having symptoms.  Prednisone taper over the next 6 days for pain and inflammation Flexeril for muscle relaxant Toradol given here for pain.  Follow up as needed for continued or worsening symptoms     ED Prescriptions    Medication Sig Dispense Auth. Provider   predniSONE (STERAPRED UNI-PAK 21 TAB) 10 MG (21) TBPK tablet 6 tabs for 1 day, then 5 tabs for 1 das, then 4 tabs for 1 day, then 3 tabs for 1 day, 2 tabs for 1 day, then 1 tab for 1 day 21 tablet Neomi Laidler A, NP   cyclobenzaprine (FLEXERIL) 10 MG tablet Take 1 tablet (10 mg total) by mouth 2 (two) times daily as needed for muscle spasms. 20 tablet Shawanda Sievert A, NP   fluconazole (DIFLUCAN) 150 MG tablet Take 1 tablet (150 mg total) by mouth daily. 2 tablet Loura Halt A, NP     PDMP not reviewed this encounter.   Orvan July, NP 09/08/19 1157

## 2020-03-05 ENCOUNTER — Other Ambulatory Visit: Payer: Self-pay

## 2020-03-05 ENCOUNTER — Emergency Department (HOSPITAL_COMMUNITY)
Admission: EM | Admit: 2020-03-05 | Discharge: 2020-03-05 | Disposition: A | Discharge status: Home / Self Care | Payer: BLUE CROSS/BLUE SHIELD | Attending: Emergency Medicine | Admitting: Emergency Medicine

## 2020-03-05 DIAGNOSIS — M7989 Other specified soft tissue disorders: Secondary | ICD-10-CM | POA: Insufficient documentation

## 2020-03-05 DIAGNOSIS — Z5321 Procedure and treatment not carried out due to patient leaving prior to being seen by health care provider: Secondary | ICD-10-CM | POA: Insufficient documentation

## 2020-03-05 NOTE — ED Triage Notes (Signed)
Patient reports bilateral leg swelling starting tuesday. Patient says pain is 6/10. Patient then states it has been happening for a while off and on and needs it to be checked out.

## 2020-04-11 ENCOUNTER — Emergency Department (HOSPITAL_BASED_OUTPATIENT_CLINIC_OR_DEPARTMENT_OTHER)
Admission: EM | Admit: 2020-04-11 | Discharge: 2020-04-11 | Disposition: A | Discharge status: Home / Self Care | Payer: BLUE CROSS/BLUE SHIELD | Attending: Emergency Medicine | Admitting: Emergency Medicine

## 2020-04-11 ENCOUNTER — Emergency Department (HOSPITAL_BASED_OUTPATIENT_CLINIC_OR_DEPARTMENT_OTHER): Payer: BLUE CROSS/BLUE SHIELD

## 2020-04-11 ENCOUNTER — Encounter (HOSPITAL_BASED_OUTPATIENT_CLINIC_OR_DEPARTMENT_OTHER): Payer: Self-pay | Admitting: *Deleted

## 2020-04-11 ENCOUNTER — Other Ambulatory Visit: Payer: Self-pay

## 2020-04-11 DIAGNOSIS — W010XXA Fall on same level from slipping, tripping and stumbling without subsequent striking against object, initial encounter: Secondary | ICD-10-CM | POA: Insufficient documentation

## 2020-04-11 DIAGNOSIS — S80911A Unspecified superficial injury of right knee, initial encounter: Secondary | ICD-10-CM | POA: Diagnosis not present

## 2020-04-11 DIAGNOSIS — Y929 Unspecified place or not applicable: Secondary | ICD-10-CM | POA: Diagnosis not present

## 2020-04-11 DIAGNOSIS — Y999 Unspecified external cause status: Secondary | ICD-10-CM | POA: Insufficient documentation

## 2020-04-11 DIAGNOSIS — Y939 Activity, unspecified: Secondary | ICD-10-CM | POA: Insufficient documentation

## 2020-04-11 DIAGNOSIS — S8991XA Unspecified injury of right lower leg, initial encounter: Secondary | ICD-10-CM

## 2020-04-11 MED ORDER — CELECOXIB 200 MG PO CAPS: 200.0000 mg | ORAL_CAPSULE | Freq: Two times a day (BID) | ORAL | 0 refills | Status: DC

## 2020-04-11 NOTE — ED Provider Notes (Addendum)
[facility] EMERGENCY DEPARTMENT Provider Note   CSN: [id number] Arrival date & time: 04/11/20  1512     History Chief Complaint  Patient presents with  . Leg Injury    Karen Lutz is a 49 y.o. female.  Who presents emergency department chief complaint of right knee pain.  Patient states that she fell directly onto her knee after she tripped.  She has had pain, bruising and swelling and difficulty ambulating on the right leg.  She denies mechanical symptoms.  She has a history of some mild bilateral knee pain.  She denies any leg swelling, numbness.  She did not have any other injuries  HPI     Past Medical History:  Diagnosis Date  . Acid reflux   . Anemia   . Fibroids   . Heart murmur   . UTI (lower urinary tract infection)     Patient Active Problem List   Diagnosis Date Noted  . Epigastric pain 07/03/2013  . Anemia 07/03/2013  . Migraine 07/03/2013  . Mood disorder ([place]) 02/15/2011  . Iron deficiency anemia 02/15/2011  . Uterine fibroid 02/15/2011  . GERD (gastroesophageal reflux disease) 02/15/2011    Past Surgical History:  Procedure Laterality Date  . ceaserian    . CESAREAN SECTION    . CESAREAN SECTION    . KNEE SURGERY    . KNEE SURGERY       OB History   No obstetric history on file.     Family History  Problem Relation Age of Onset  . Hypertension Other   . Cancer Other   . Alcohol abuse Mother   . Heart disease Mother   . Alcohol abuse Father   . Heart disease Father   . Arthritis Maternal Grandmother   . Arthritis Maternal Grandfather   . Arthritis Paternal Grandmother   . Arthritis Paternal Grandfather     Social History   Tobacco Use  . Smoking status: Never Smoker  . Smokeless tobacco: Never Used  Vaping Use  . Vaping Use: Never used  Substance Use Topics  . Alcohol use: No  . Drug use: No    Home Medications Prior to Admission medications   Medication Sig Start Date End Date Taking? Authorizing Provider    Capsaicin-Menthol-Methyl Sal (CAPSAICIN-METHYL SAL-MENTHOL) 0.025-1-12 % CREA Apply 1 application topically 3 (three) times daily as needed (as needed for muscle pain). 03/12/19   Noe Gens, PA-C  celecoxib (CELEBREX) 200 MG capsule Take 1 capsule (200 mg total) by mouth 2 (two) times daily. 04/11/20   Margarita Mail, PA-C  cyclobenzaprine (FLEXERIL) 10 MG tablet Take 1 tablet (10 mg total) by mouth at bedtime. 02/10/19   Melynda Ripple, MD  cyclobenzaprine (FLEXERIL) 10 MG tablet Take 1 tablet (10 mg total) by mouth 2 (two) times daily as needed for muscle spasms. 09/07/19   Loura Halt A, NP  fluconazole (DIFLUCAN) 150 MG tablet Take 1 tablet (150 mg total) by mouth daily. 09/07/19   Loura Halt A, NP  ibuprofen (ADVIL) 600 MG tablet Take 1 tablet (600 mg total) by mouth every 6 (six) hours as needed. 05/18/19   Raylene Everts, MD  pantoprazole (PROTONIX) 20 MG tablet Take 1 tablet (20 mg total) by mouth daily. 03/12/19 04/11/19  Noe Gens, PA-C  predniSONE (STERAPRED UNI-PAK 21 TAB) 10 MG (21) TBPK tablet 6 tabs for 1 day, then 5 tabs for 1 das, then 4 tabs for 1 day, then 3 tabs for 1 day,  2 tabs for 1 day, then 1 tab for 1 day 09/07/19   Loura Halt A, NP    Allergies    Penicillins and Penicillin g  Review of Systems   Review of Systems Positive for knee pain and swelling, negative for numbness, tingling feeling or weakness Physical Exam Updated Vital Signs BP 129/90   Pulse 89   Temp 98.6 F (37 C)   Resp 16   Ht 5\' 6"  (1.676 m)   Wt 132 kg   LMP 03/24/2020   SpO2 99%   BMI 46.97 kg/m   Physical Exam Vitals and nursing note reviewed.  Constitutional:      General: She is not in acute distress.    Appearance: She is well-developed. She is not diaphoretic.  HENT:     Head: Normocephalic and atraumatic.  Eyes:     General: No scleral icterus.    Conjunctiva/sclera: Conjunctivae normal.  Cardiovascular:     Rate and Rhythm: Normal rate and regular rhythm.     Heart  sounds: Normal heart sounds. No murmur heard.  No friction rub. No gallop.   Pulmonary:     Effort: Pulmonary effort is normal. No respiratory distress.     Breath sounds: Normal breath sounds.  Abdominal:     General: Bowel sounds are normal. There is no distension.     Palpations: Abdomen is soft. There is no mass.     Tenderness: There is no abdominal tenderness. There is no guarding.  Musculoskeletal:     Cervical back: Normal range of motion.     Comments: Right over the patella with tenderness.  Range of motion is decreased motion with deep flexion.  Ligaments are stable.  She is able to stand and has antalgic gait.  Skin:    General: Skin is warm and dry.  Neurological:     Mental Status: She is alert and oriented to person, place, and time.  Psychiatric:        Behavior: Behavior normal.     ED Results / Procedures / Treatments   Labs (all labs ordered are listed, but only abnormal results are displayed) Labs Reviewed - No data to display  EKG None  Radiology DG Knee Complete 4 Views Right  Result Date: 04/11/2020 CLINICAL DATA:  Fall.  Pain. EXAM: RIGHT KNEE - COMPLETE 4+ VIEW COMPARISON:  05/18/2019 FINDINGS: Mild 3 compartment osteoarthritis. Joint space narrowing and osteophyte formation. Possible small suprapatellar joint effusion. IMPRESSION: Degenerative change, without acute osseous finding. Electronically Signed   By: Sharnise Blough Miyamoto M.D.   On: 04/11/2020 15:43    Procedures Procedures (including critical care time)  Medications Ordered in ED Medications - No data to display  ED Course  I have reviewed the triage vital signs and the nursing notes.  Pertinent labs & imaging results that were available during my care of the patient were reviewed by me and considered in my medical decision making (see chart for details).    MDM Rules/Calculators/A&P                          Patient here with right knee injury.  I personally reviewed the plain films of the  patient's right knee which shows tricompartmental degenerative changes without evidence of acute injury, effusion, fracture.  We will treat the patient symptomatically with Ace wrap, crutches, advised icing the knee.  I have ordered Celebrex.  Outpatient follow-up with sports medicine if she needs reevaluation.  She appears otherwise appropriate for discharge at this time.   Karen Lutz was evaluated in Emergency Department on 04/11/2020 for the symptoms described in the history of present illness. She was evaluated in the context of the global COVID-19 pandemic, which necessitated consideration that the patient might be at risk for infection with the SARS-CoV-2 virus that causes COVID-19. Institutional protocols and algorithms that pertain to the evaluation of patients at risk for COVID-19 are in a state of rapid change based on information released by regulatory bodies including the CDC and federal and state organizations. These policies and algorithms were followed during the patient's care in the ED.  Final Clinical Impression(s) / ED Diagnoses Final diagnoses:  Knee injury, right, initial encounter    Rx / DC Orders ED Discharge Orders         Ordered    celecoxib (CELEBREX) 200 MG capsule  2 times daily,   Status:  Discontinued        04/11/20 1617    celecoxib (CELEBREX) 200 MG capsule  2 times daily        04/11/20 1619           Margarita Mail, PA-C 04/11/20 1624    Margarita Mail, PA-C 04/11/20 1637    Davonna Belling, MD 04/12/20 0006

## 2020-04-11 NOTE — Discharge Instructions (Addendum)
Get help right away if: You cannot use your injured joint to support any of your body weight (cannot bear weight). You cannot move the injured joint. You cannot walk more than a few steps without pain or without your knee buckling. You have significant pain, swelling, or numbness below the cast, brace, or splint.

## 2020-04-11 NOTE — ED Triage Notes (Signed)
C/o right knee injury x 6 hrs

## 2020-04-13 ENCOUNTER — Telehealth: Payer: Self-pay | Admitting: Family Medicine

## 2020-04-13 NOTE — Telephone Encounter (Signed)
Called patient to offer ED follow-up care for knee injury--Left msg to call office.  -glh

## 2020-05-09 ENCOUNTER — Encounter: Payer: Self-pay | Admitting: Family Medicine

## 2020-05-09 ENCOUNTER — Ambulatory Visit (INDEPENDENT_AMBULATORY_CARE_PROVIDER_SITE_OTHER): Payer: BLUE CROSS/BLUE SHIELD | Admitting: Family Medicine

## 2020-05-09 ENCOUNTER — Other Ambulatory Visit: Payer: Self-pay

## 2020-05-09 ENCOUNTER — Ambulatory Visit: Payer: Self-pay

## 2020-05-09 VITALS — BP 127/88 | HR 88 | Ht 66.0 in | Wt 295.0 lb

## 2020-05-09 DIAGNOSIS — M898X6 Other specified disorders of bone, lower leg: Secondary | ICD-10-CM | POA: Diagnosis not present

## 2020-05-09 DIAGNOSIS — M25561 Pain in right knee: Secondary | ICD-10-CM

## 2020-05-09 DIAGNOSIS — S82141A Displaced bicondylar fracture of right tibia, initial encounter for closed fracture: Secondary | ICD-10-CM | POA: Insufficient documentation

## 2020-05-09 DIAGNOSIS — M1710 Unilateral primary osteoarthritis, unspecified knee: Secondary | ICD-10-CM | POA: Insufficient documentation

## 2020-05-09 DIAGNOSIS — M179 Osteoarthritis of knee, unspecified: Secondary | ICD-10-CM | POA: Insufficient documentation

## 2020-05-09 MED ORDER — PENNSAID 2 % EX SOLN: 1.0000 "application " | Freq: Two times a day (BID) | CUTANEOUS | 2 refills | Status: DC

## 2020-05-09 NOTE — Assessment & Plan Note (Signed)
She had a fall a few weeks ago.  Pain is still ongoing and seems to be progressing.  Imaging was revealing for possible occult fracture of tibia from the trauma. -Counseled on supportive care. -Provided work note. -Rollator. -May consider MRI if no improvement with partial weightbearing.

## 2020-05-09 NOTE — Patient Instructions (Signed)
Nice to meet you Please try ice  Please try the rub on medicine  Please send me a message in MyChart with any questions or updates.  Please see me back in 2 weeks.   --Dr. Raeford Razor

## 2020-05-09 NOTE — Progress Notes (Signed)
Karen Lutz - 49 y.o. female MRN [id number]  Date of birth: [birth year]  SUBJECTIVE:  Including CC & ROS.  Chief Complaint  Patient presents with  . Leg Pain    bilateral    Karen Lutz is a 49 y.o. female that is presenting with acute tibial pain.  She had a fall a few weeks ago and has continued pain.  It is worse with prolonged standing.  Is on the anterior proximal portion of the tibia.  No improvement with modalities today..  Independent review the right knee x-ray from 9/6 shows no acute changes.   Review of Systems See HPI   HISTORY: Past Medical, Surgical, Social, and Family History Reviewed & Updated per EMR.   Pertinent Historical Findings include:  Past Medical History:  Diagnosis Date  . Acid reflux   . Anemia   . Fibroids   . Heart murmur   . UTI (lower urinary tract infection)     Past Surgical History:  Procedure Laterality Date  . ceaserian    . CESAREAN SECTION    . CESAREAN SECTION    . KNEE SURGERY    . KNEE SURGERY      Family History  Problem Relation Age of Onset  . Hypertension Other   . Cancer Other   . Alcohol abuse Mother   . Heart disease Mother   . Alcohol abuse Father   . Heart disease Father   . Arthritis Maternal Grandmother   . Arthritis Maternal Grandfather   . Arthritis Paternal Grandmother   . Arthritis Paternal Grandfather     Social History   Socioeconomic History  . Marital status: Married    Spouse name: Not on file  . Number of children: Not on file  . Years of education: Not on file  . Highest education level: Not on file  Occupational History  . Not on file  Tobacco Use  . Smoking status: Never Smoker  . Smokeless tobacco: Never Used  Vaping Use  . Vaping Use: Never used  Substance and Sexual Activity  . Alcohol use: No  . Drug use: No  . Sexual activity: Not on file  Other Topics Concern  . Not on file  Social History Narrative  . Not on file   Social Determinants of Health   Financial  Resource Strain:   . Difficulty of Paying Living Expenses: Not on file  Food Insecurity:   . Worried About Charity fundraiser in the Last Year: Not on file  . Ran Out of Food in the Last Year: Not on file  Transportation Needs:   . Lack of Transportation (Medical): Not on file  . Lack of Transportation (Non-Medical): Not on file  Physical Activity:   . Days of Exercise per Week: Not on file  . Minutes of Exercise per Session: Not on file  Stress:   . Feeling of Stress : Not on file  Social Connections:   . Frequency of Communication with Friends and Family: Not on file  . Frequency of Social Gatherings with Friends and Family: Not on file  . Attends Religious Services: Not on file  . Active Member of Clubs or Organizations: Not on file  . Attends Archivist Meetings: Not on file  . Marital Status: Not on file  Intimate Partner Violence:   . Fear of Current or Ex-Partner: Not on file  . Emotionally Abused: Not on file  . Physically Abused: Not on file  . Sexually  Abused: Not on file     PHYSICAL EXAM:  VS: BP 127/88   Pulse 88   Ht 5\' 6"  (1.676 m)   Wt 295 lb (133.8 kg)   BMI 47.61 kg/m  Physical Exam Gen: NAD, alert, cooperative with exam, well-appearing MSK:  Right knee: No obvious effusion. Normal range of motion. No tenderness to palpation of the medial joint space. Tenderness palpation over the anterior proximal tibia. Neurovascularly intact  Limited ultrasound: Right knee:  No obvious effusion suprapatellar pouch. Normal-appearing quadricep and patellar tendon. There is no overlying hematoma near the insertion of the patellar tendon. Increased hyperemia over the anterior proximal portion of the tibia.  This could represent occult fracture.  Summary: Findings concerning for an occult fracture of the tibia.  Ultrasound and interpretation by Clearance Coots, MD    ASSESSMENT & PLAN:   Tibial pain She had a fall a few weeks ago.  Pain is still  ongoing and seems to be progressing.  Imaging was revealing for possible occult fracture of tibia from the trauma. -Counseled on supportive care. -Provided work note. -Rollator. -May consider MRI if no improvement with partial weightbearing.

## 2020-05-23 ENCOUNTER — Other Ambulatory Visit: Payer: Self-pay

## 2020-05-23 ENCOUNTER — Encounter: Payer: Self-pay | Admitting: Family Medicine

## 2020-05-23 ENCOUNTER — Ambulatory Visit: Payer: BLUE CROSS/BLUE SHIELD | Admitting: Family Medicine

## 2020-05-23 DIAGNOSIS — S82141D Displaced bicondylar fracture of right tibia, subsequent encounter for closed fracture with routine healing: Secondary | ICD-10-CM | POA: Diagnosis not present

## 2020-05-23 NOTE — Patient Instructions (Signed)
Good to see you Please continue to limit being on the right leg.   Please send me a message in MyChart with any questions or updates.  We will setup a virtual visit once the MRI is resulted.   --Dr. Raeford Razor

## 2020-05-23 NOTE — Assessment & Plan Note (Signed)
Initial injury was on 9/6.  She fell square on the proximal tibia.  Concern of tibial plateau fracture with ongoing pain and little improvement thus far. -Counseled on partial weightbearing. -Counseled home exercise therapy and supportive care. -MRI of the right knee to evaluate for tibial plateau fracture.

## 2020-05-23 NOTE — Progress Notes (Signed)
Karen Lutz - 49 y.o. female MRN [id number]  Date of birth: 1971/06/19  SUBJECTIVE:  Including CC & ROS.  Chief Complaint  Patient presents with  . Follow-up    right leg    Karen Lutz is a 49 y.o. female that is presenting with ongoing right knee pain after trauma.  Has been trying to limit her weightbearing.   Review of Systems See HPI   HISTORY: Past Medical, Surgical, Social, and Family History Reviewed & Updated per EMR.   Pertinent Historical Findings include:  Past Medical History:  Diagnosis Date  . Acid reflux   . Anemia   . Fibroids   . Heart murmur   . UTI (lower urinary tract infection)     Past Surgical History:  Procedure Laterality Date  . ceaserian    . CESAREAN SECTION    . CESAREAN SECTION    . KNEE SURGERY    . KNEE SURGERY      Family History  Problem Relation Age of Onset  . Hypertension Other   . Cancer Other   . Alcohol abuse Mother   . Heart disease Mother   . Alcohol abuse Father   . Heart disease Father   . Arthritis Maternal Grandmother   . Arthritis Maternal Grandfather   . Arthritis Paternal Grandmother   . Arthritis Paternal Grandfather     Social History   Socioeconomic History  . Marital status: Married    Spouse name: Not on file  . Number of children: Not on file  . Years of education: Not on file  . Highest education level: Not on file  Occupational History  . Not on file  Tobacco Use  . Smoking status: Never Smoker  . Smokeless tobacco: Never Used  Vaping Use  . Vaping Use: Never used  Substance and Sexual Activity  . Alcohol use: No  . Drug use: No  . Sexual activity: Not on file  Other Topics Concern  . Not on file  Social History Narrative  . Not on file   Social Determinants of Health   Financial Resource Strain:   . Difficulty of Paying Living Expenses: Not on file  Food Insecurity:   . Worried About Charity fundraiser in the Last Year: Not on file  . Ran Out of Food in the Last Year:  Not on file  Transportation Needs:   . Lack of Transportation (Medical): Not on file  . Lack of Transportation (Non-Medical): Not on file  Physical Activity:   . Days of Exercise per Week: Not on file  . Minutes of Exercise per Session: Not on file  Stress:   . Feeling of Stress : Not on file  Social Connections:   . Frequency of Communication with Friends and Family: Not on file  . Frequency of Social Gatherings with Friends and Family: Not on file  . Attends Religious Services: Not on file  . Active Member of Clubs or Organizations: Not on file  . Attends Archivist Meetings: Not on file  . Marital Status: Not on file  Intimate Partner Violence:   . Fear of Current or Ex-Partner: Not on file  . Emotionally Abused: Not on file  . Physically Abused: Not on file  . Sexually Abused: Not on file     PHYSICAL EXAM:  VS: BP 126/83   Pulse 93   Ht 5\' 6"  (1.676 m)   Wt 290 lb (131.5 kg)   BMI 46.81 kg/m  Physical Exam Gen: NAD, alert, cooperative with exam, well-appearing MSK:  Right knee: Significant tenderness to palpation of the proximal tibia. No swelling or ecchymosis. Normal range of motion. Normal strength resistance. Neurovascularly intact     ASSESSMENT & PLAN:   Tibial plateau fracture, right Initial injury was on 9/6.  She fell square on the proximal tibia.  Concern of tibial plateau fracture with ongoing pain and little improvement thus far. -Counseled on partial weightbearing. -Counseled home exercise therapy and supportive care. -MRI of the right knee to evaluate for tibial plateau fracture.

## 2020-05-30 ENCOUNTER — Other Ambulatory Visit: Payer: BLUE CROSS/BLUE SHIELD

## 2020-06-01 ENCOUNTER — Emergency Department (HOSPITAL_BASED_OUTPATIENT_CLINIC_OR_DEPARTMENT_OTHER)
Admission: EM | Admit: 2020-06-01 | Discharge: 2020-06-01 | Disposition: A | Discharge status: Home / Self Care | Payer: BLUE CROSS/BLUE SHIELD | Attending: Emergency Medicine | Admitting: Emergency Medicine

## 2020-06-01 ENCOUNTER — Encounter (HOSPITAL_BASED_OUTPATIENT_CLINIC_OR_DEPARTMENT_OTHER): Payer: Self-pay

## 2020-06-01 ENCOUNTER — Other Ambulatory Visit: Payer: Self-pay

## 2020-06-01 DIAGNOSIS — Z880 Allergy status to penicillin: Secondary | ICD-10-CM | POA: Diagnosis not present

## 2020-06-01 DIAGNOSIS — G43909 Migraine, unspecified, not intractable, without status migrainosus: Secondary | ICD-10-CM | POA: Insufficient documentation

## 2020-06-01 DIAGNOSIS — R519 Headache, unspecified: Secondary | ICD-10-CM | POA: Diagnosis present

## 2020-06-01 DIAGNOSIS — G43009 Migraine without aura, not intractable, without status migrainosus: Secondary | ICD-10-CM

## 2020-06-01 MED ORDER — METOCLOPRAMIDE HCL 5 MG/ML IJ SOLN
10.0000 mg | Freq: Once | INTRAMUSCULAR | Status: AC
  Administered 2020-06-01: 10 mg via INTRAVENOUS
  Filled 2020-06-01: qty 2

## 2020-06-01 MED ORDER — MAGNESIUM SULFATE IN D5W 1-5 GM/100ML-% IV SOLN
1.0000 g | Freq: Once | INTRAVENOUS | Status: AC
  Administered 2020-06-01: 1 g via INTRAVENOUS
  Filled 2020-06-01: qty 100

## 2020-06-01 MED ORDER — SODIUM CHLORIDE 0.9 % IV BOLUS
500.0000 mL | Freq: Once | INTRAVENOUS | Status: AC
  Administered 2020-06-01: 500 mL via INTRAVENOUS

## 2020-06-01 MED ORDER — KETOROLAC TROMETHAMINE 30 MG/ML IJ SOLN
30.0000 mg | Freq: Once | INTRAMUSCULAR | Status: AC
  Administered 2020-06-01: 30 mg via INTRAVENOUS
  Filled 2020-06-01: qty 1

## 2020-06-01 MED ORDER — DIPHENHYDRAMINE HCL 50 MG/ML IJ SOLN
25.0000 mg | Freq: Once | INTRAMUSCULAR | Status: AC
  Administered 2020-06-01: 25 mg via INTRAVENOUS
  Filled 2020-06-01: qty 1

## 2020-06-01 MED ORDER — DEXAMETHASONE SODIUM PHOSPHATE 10 MG/ML IJ SOLN
10.0000 mg | Freq: Once | INTRAMUSCULAR | Status: AC
  Administered 2020-06-01: 10 mg via INTRAVENOUS
  Filled 2020-06-01: qty 1

## 2020-06-01 NOTE — Discharge Instructions (Signed)
You were seen in the emerge department today with migraine type headache. We are able to control your symptoms here. I have listed the name of a primary care doctor as well as neurology practice for you to call and schedule follow-up appointments with. Please return to the emergency department any sudden, severe headaches, fever, other sudden worsening symptoms.

## 2020-06-01 NOTE — ED Triage Notes (Signed)
Pt states she woke ~5am with migraine, n/v-NAD-to triage in w/c

## 2020-06-01 NOTE — ED Provider Notes (Signed)
Emergency Department Provider Note   I have reviewed the triage vital signs and the nursing notes.   HISTORY  Chief Complaint Migraine   HPI Karen Lutz is a 49 y.o. female with past medical history of migraine presents to the emergency department with migraine headache that began this morning. Symptoms began at 5 AM and have persisted through the morning. She describes a frontal, throbbing headache with light sensitivity and nausea which is typical of her prior migraine symptoms. She does not take daily migraine medication. She does not follow with a neurologist or other headache specialist. She denies any neck or back pain. No fevers or shaking chills. No respiratory symptoms. No unilateral weakness or numbness. No radiation of symptoms or other modifying factors.    Past Medical History:  Diagnosis Date  . Acid reflux   . Anemia   . Fibroids   . Heart murmur   . UTI (lower urinary tract infection)     Patient Active Problem List   Diagnosis Date Noted  . Tibial plateau fracture, right 05/09/2020  . Epigastric pain 07/03/2013  . Anemia 07/03/2013  . Migraine 07/03/2013  . Mood disorder ([place]) 02/15/2011  . Iron deficiency anemia 02/15/2011  . Uterine fibroid 02/15/2011  . GERD (gastroesophageal reflux disease) 02/15/2011    Past Surgical History:  Procedure Laterality Date  . ceaserian    . CESAREAN SECTION    . CESAREAN SECTION    . KNEE SURGERY    . KNEE SURGERY      Allergies Penicillins and Penicillin g  Family History  Problem Relation Age of Onset  . Hypertension Other   . Cancer Other   . Alcohol abuse Mother   . Heart disease Mother   . Alcohol abuse Father   . Heart disease Father   . Arthritis Maternal Grandmother   . Arthritis Maternal Grandfather   . Arthritis Paternal Grandmother   . Arthritis Paternal Grandfather     Social History Social History   Tobacco Use  . Smoking status: Never Smoker  . Smokeless tobacco: Never Used    Vaping Use  . Vaping Use: Never used  Substance Use Topics  . Alcohol use: No  . Drug use: No    Review of Systems  Constitutional: No fever/chills Eyes: No visual changes. Positive photophobia.  ENT: No sore throat. Cardiovascular: Denies chest pain. Respiratory: Denies shortness of breath. Gastrointestinal: No abdominal pain. Positive nausea, no vomiting.  No diarrhea.  No constipation. Genitourinary: Negative for dysuria. Musculoskeletal: Negative for back pain. Skin: Negative for rash. Neurological: Negative for focal weakness or numbness. Positive HA typical of migraine.   10-point ROS otherwise negative.  ____________________________________________   PHYSICAL EXAM:  VITAL SIGNS: ED Triage Vitals  Enc Vitals Group     BP 06/01/20 1144 127/85     Pulse Rate 06/01/20 1144 78     Resp 06/01/20 1144 18     Temp 06/01/20 1144 (!) 97.3 F (36.3 C)     Temp Source 06/01/20 1144 Oral     SpO2 06/01/20 1144 100 %     Weight 06/01/20 1144 (!) 301 lb (136.5 kg)     Height 06/01/20 1144 5\' 6"  (1.676 m)   Constitutional: Alert and oriented. Laying in dark room appearing uncomfortable but no acute distress. Able to provide a complete history.  Eyes: Conjunctivae are normal. Photophobia on exam.  Head: Atraumatic. Nose: No congestion/rhinnorhea. Mouth/Throat: Mucous membranes are moist.  Neck: No stridor.   Cardiovascular:  Normal rate, regular rhythm. Good peripheral circulation. Grossly normal heart sounds.   Respiratory: Normal respiratory effort.  No retractions. Lungs CTAB. Gastrointestinal: Soft and nontender. No distention.  Musculoskeletal: No gross deformities of extremities. Neurologic:  Normal speech and language.  Skin:  Skin is warm, dry and intact. No rash noted.  ____________________________________________   PROCEDURES  Procedure(s) performed:   Procedures  None  ____________________________________________   INITIAL IMPRESSION / ASSESSMENT  AND PLAN / ED COURSE  Pertinent labs & imaging results that were available during my care of the patient were reviewed by me and considered in my medical decision making (see chart for details).   Patient presents to the emergency department for evaluation of migraine headache starting this morning. No sudden onset, maximal intensity headache symptoms. The headache is typical of her migraines. Plan for symptom management here and reassess. Doubt SAH, meningitis, or other emergent cause for HA.   02:06 PM  On reassessment patient is feeling much better.  She has some drowsiness related to the medications but feels as if her symptoms have significantly improved.  Discussed PCP follow-up along with ED return precautions.  Will attach contact information for neurology and have the patient discuss with her PCP whether specialist follow-up is indicated.  ____________________________________________  FINAL CLINICAL IMPRESSION(S) / ED DIAGNOSES  Final diagnoses:  Migraine without aura and without status migrainosus, not intractable    MEDICATIONS GIVEN DURING THIS VISIT:  Medications  sodium chloride 0.9 % bolus 500 mL (500 mLs Intravenous New Bag/Given 06/01/20 1306)  ketorolac (TORADOL) 30 MG/ML injection 30 mg (30 mg Intravenous Given 06/01/20 1308)  diphenhydrAMINE (BENADRYL) injection 25 mg (25 mg Intravenous Given 06/01/20 1307)  metoCLOPramide (REGLAN) injection 10 mg (10 mg Intravenous Given 06/01/20 1310)  dexamethasone (DECADRON) injection 10 mg (10 mg Intravenous Given 06/01/20 1311)  magnesium sulfate IVPB 1 g 100 mL (0 g Intravenous Stopped 06/01/20 1331)    Note:  This document was prepared using Dragon voice recognition software and may include unintentional dictation errors.  Nanda Quinton, MD, [facility] Emergency Medicine    Roda Lauture, Wonda Olds, MD 06/01/20 1414

## 2020-06-06 ENCOUNTER — Ambulatory Visit (HOSPITAL_COMMUNITY)
Admission: EM | Admit: 2020-06-06 | Discharge: 2020-06-06 | Disposition: A | Discharge status: Home / Self Care | Payer: BLUE CROSS/BLUE SHIELD

## 2020-06-06 ENCOUNTER — Emergency Department (HOSPITAL_BASED_OUTPATIENT_CLINIC_OR_DEPARTMENT_OTHER): Payer: BLUE CROSS/BLUE SHIELD

## 2020-06-06 ENCOUNTER — Emergency Department (HOSPITAL_BASED_OUTPATIENT_CLINIC_OR_DEPARTMENT_OTHER)
Admission: EM | Admit: 2020-06-06 | Discharge: 2020-06-06 | Disposition: A | Discharge status: Home / Self Care | Payer: BLUE CROSS/BLUE SHIELD | Attending: Emergency Medicine | Admitting: Emergency Medicine

## 2020-06-06 ENCOUNTER — Encounter (HOSPITAL_BASED_OUTPATIENT_CLINIC_OR_DEPARTMENT_OTHER): Payer: Self-pay | Admitting: *Deleted

## 2020-06-06 ENCOUNTER — Other Ambulatory Visit: Payer: Self-pay

## 2020-06-06 ENCOUNTER — Other Ambulatory Visit (HOSPITAL_BASED_OUTPATIENT_CLINIC_OR_DEPARTMENT_OTHER): Payer: Self-pay | Admitting: Student

## 2020-06-06 DIAGNOSIS — Y92009 Unspecified place in unspecified non-institutional (private) residence as the place of occurrence of the external cause: Secondary | ICD-10-CM | POA: Diagnosis not present

## 2020-06-06 DIAGNOSIS — S2232XA Fracture of one rib, left side, initial encounter for closed fracture: Secondary | ICD-10-CM | POA: Diagnosis not present

## 2020-06-06 DIAGNOSIS — S2002XA Contusion of left breast, initial encounter: Secondary | ICD-10-CM | POA: Diagnosis not present

## 2020-06-06 DIAGNOSIS — S299XXA Unspecified injury of thorax, initial encounter: Secondary | ICD-10-CM | POA: Diagnosis present

## 2020-06-06 DIAGNOSIS — W010XXA Fall on same level from slipping, tripping and stumbling without subsequent striking against object, initial encounter: Secondary | ICD-10-CM | POA: Diagnosis not present

## 2020-06-06 MED ORDER — KETOROLAC TROMETHAMINE 30 MG/ML IJ SOLN
30.0000 mg | Freq: Once | INTRAMUSCULAR | Status: AC
  Administered 2020-06-06: 30 mg via INTRAMUSCULAR
  Filled 2020-06-06: qty 1

## 2020-06-06 MED ORDER — IBUPROFEN 800 MG PO TABS: 800.0000 mg | ORAL_TABLET | Freq: Three times a day (TID) | ORAL | 0 refills | Status: DC

## 2020-06-06 MED FILL — IBUPROFEN 800 MG TAB: 800 | 7 days supply | Qty: 21 | Fill #0

## 2020-06-06 NOTE — Discharge Instructions (Signed)
You were seen in the emergency department today for left rib pain.  Your chest x-ray revealed a fracture in the anterior aspect of your left sixth rib.  You were given anti-inflammatory medication while the emergency department.  You may continue to take ibuprofen or Tylenol at home for your pain.  It will be important, as we discussed, that you continue to breathe normally, the full depth of your lungs, to prevent collapse in the bases of your lungs or infection.  Please keep your primary care doctor appointment for 06/09/2020.  Please return immediately to the emergency department develop any new chest pain, difficulty breathing, palpitations, or other new severe symptoms.
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
## 2020-06-06 NOTE — ED Triage Notes (Signed)
She tripped and fell 2 days ago. Injury to her left ribs. Bruising noted. Pain when she takes a deep breath.
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
## 2020-06-13 ENCOUNTER — Other Ambulatory Visit: Payer: BLUE CROSS/BLUE SHIELD

## 2020-06-24 ENCOUNTER — Emergency Department (HOSPITAL_BASED_OUTPATIENT_CLINIC_OR_DEPARTMENT_OTHER)
Admission: EM | Admit: 2020-06-24 | Discharge: 2020-06-24 | Disposition: A | Discharge status: Home / Self Care | Payer: BLUE CROSS/BLUE SHIELD | Attending: Emergency Medicine | Admitting: Emergency Medicine

## 2020-06-24 ENCOUNTER — Emergency Department (HOSPITAL_BASED_OUTPATIENT_CLINIC_OR_DEPARTMENT_OTHER): Payer: BLUE CROSS/BLUE SHIELD

## 2020-06-24 ENCOUNTER — Encounter (HOSPITAL_BASED_OUTPATIENT_CLINIC_OR_DEPARTMENT_OTHER): Payer: Self-pay | Admitting: Emergency Medicine

## 2020-06-24 ENCOUNTER — Other Ambulatory Visit: Payer: Self-pay

## 2020-06-24 DIAGNOSIS — M542 Cervicalgia: Secondary | ICD-10-CM | POA: Insufficient documentation

## 2020-06-24 DIAGNOSIS — S0990XA Unspecified injury of head, initial encounter: Secondary | ICD-10-CM | POA: Diagnosis not present

## 2020-06-24 DIAGNOSIS — Z79899 Other long term (current) drug therapy: Secondary | ICD-10-CM | POA: Insufficient documentation

## 2020-06-24 DIAGNOSIS — S62355A Nondisplaced fracture of shaft of fourth metacarpal bone, left hand, initial encounter for closed fracture: Secondary | ICD-10-CM

## 2020-06-24 DIAGNOSIS — M79642 Pain in left hand: Secondary | ICD-10-CM | POA: Diagnosis present

## 2020-06-24 LAB — PREGNANCY, URINE: Preg Test, Ur: NEGATIVE

## 2020-06-24 MED ORDER — HYDROCODONE-ACETAMINOPHEN 5-325 MG PO TABS
1.0000 | ORAL_TABLET | Freq: Four times a day (QID) | ORAL | 0 refills | Status: DC | PRN
Start: 2020-06-24 — End: 2020-10-30

## 2020-06-24 MED ORDER — ACETAMINOPHEN 325 MG PO TABS
650.0000 mg | ORAL_TABLET | Freq: Once | ORAL | Status: AC
  Administered 2020-06-24: 650 mg via ORAL
  Filled 2020-06-24: qty 2
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
## 2020-06-24 NOTE — Discharge Instructions (Signed)
At this time there does not appear to be the presence of an emergent medical condition, however there is always the potential for conditions to change. Please read and follow the below instructions.  Please return to the Emergency Department immediately for any new or worsening symptoms. Please be sure to follow up with your Primary Care Provider within one week regarding your visit today; please call their office to schedule an appointment even if you are feeling better for a follow-up visit. Please call Dr. Levell July office today to schedule follow-up appointment for definitive care of your left hand injury.  Use rest ice elevation to help with pain and swelling.  You may use over-the-counter anti-inflammatory such as Tylenol as directed on the packaging to help with your pain. You may take the medication Norco (Hydrocodone/Acetaminophen) as prescribed to help with severe pain.  This medication will make you drowsy so do not drive, drink alcohol, take other sedating medications or perform any dangerous activities such as driving after taking Norco. Norco contains Tylenol (acetaminophen) so do not take any other Tylenol-containing products with Norco.   Go to the nearest Emergency Department immediately if: You have fever or chills You have: A very bad headache that is not helped by medicine. Trouble walking or weakness in your arms and legs. Clear or bloody fluid coming from your nose or ears. Changes in how you see (vision). Shaking movements that you cannot control. You lose your balance. You vomit. The black centers of your eyes (pupils) change in size. Your speech is slurred. Your dizziness gets worse. You pass out. You are sleepier than normal and have trouble staying awake. You have very bad pain under the cast or in your hand. You have trouble breathing. The following happen, even after you loosen your splint: Your hand or fingernails turn blue or gray. Your hand feels cold or  numb. You have any new/concerning or worsening of symptoms   Please read the additional information packets attached to your discharge summary.  Do not take your medicine if  develop an itchy rash, swelling in your mouth or lips, or difficulty breathing; call 911 and seek immediate emergency medical attention if this occurs.  You may review your lab tests and imaging results in their entirety on your MyChart account.  Please discuss all results of fully with your primary care provider and other specialist at your follow-up visit.  Note: Portions of this text may have been transcribed using voice recognition software. Every effort was made to ensure accuracy; however, inadvertent computerized transcription errors may still be present.

## 2020-09-08 ENCOUNTER — Other Ambulatory Visit: Payer: Self-pay

## 2020-09-08 ENCOUNTER — Encounter (HOSPITAL_COMMUNITY): Payer: Self-pay

## 2020-09-08 ENCOUNTER — Ambulatory Visit (HOSPITAL_COMMUNITY)
Admission: EM | Admit: 2020-09-08 | Discharge: 2020-09-08 | Disposition: A | Discharge status: Home / Self Care | Payer: BLUE CROSS/BLUE SHIELD

## 2020-09-08 ENCOUNTER — Ambulatory Visit (INDEPENDENT_AMBULATORY_CARE_PROVIDER_SITE_OTHER): Payer: BLUE CROSS/BLUE SHIELD

## 2020-09-08 DIAGNOSIS — J069 Acute upper respiratory infection, unspecified: Secondary | ICD-10-CM

## 2020-09-08 DIAGNOSIS — R059 Cough, unspecified: Secondary | ICD-10-CM

## 2020-09-08 DIAGNOSIS — R062 Wheezing: Secondary | ICD-10-CM

## 2020-09-08 DIAGNOSIS — R0989 Other specified symptoms and signs involving the circulatory and respiratory systems: Secondary | ICD-10-CM

## 2020-09-08 DIAGNOSIS — R0602 Shortness of breath: Secondary | ICD-10-CM

## 2020-09-08 DIAGNOSIS — R519 Headache, unspecified: Secondary | ICD-10-CM | POA: Diagnosis not present

## 2020-09-08 MED ORDER — PROMETHAZINE-DM 6.25-15 MG/5ML PO SYRP: 5.0000 mL | ORAL_SOLUTION | Freq: Every evening | ORAL | 0 refills | Status: DC | PRN

## 2020-09-08 MED ORDER — ALBUTEROL SULFATE HFA 108 (90 BASE) MCG/ACT IN AERS: 2.0000 | INHALATION_SPRAY | RESPIRATORY_TRACT | 0 refills | Status: DC | PRN

## 2020-09-08 MED ORDER — BENZONATATE 100 MG PO CAPS: 100.0000 mg | ORAL_CAPSULE | Freq: Three times a day (TID) | ORAL | 0 refills | Status: DC

## 2020-09-08 MED ORDER — PREDNISONE 10 MG PO TABS: 40.0000 mg | ORAL_TABLET | Freq: Every day | ORAL | 0 refills | Status: DC

## 2020-09-08 NOTE — Discharge Instructions (Addendum)
Follow up if symptoms worsen  Take pill cough medication as needed every 8 hours Take cough syrup at bedtime to assist with sleeping Take steroid medication once in the morning to help with breathing Use inhaler as needed every 4-6 hours to help with shortness of breath

## 2020-09-08 NOTE — ED Provider Notes (Signed)
[facility]    CSN: [id number] Arrival date & time: 09/08/20  1039      History   Chief Complaint Chief Complaint  Patient presents with  . Shortness of Breath  . Wheezing  . Cough  . Headache  . Nasal Congestion    HPI Karen Lutz is a 50 y.o. female.   Patient presents with runny nose, congestion, headaches, fever, productive cough with green sputum, dyspnea with exertion and at rest and wheezing beginning 3 days ago. Denies  Sneezing, sore throat, body aches and chills, No known sick contacts.Tested for covid on 2/2. Rapid test negative. Results from PCR pending. Taking otc cough medication with no relief.    Past Medical History:  Diagnosis Date  . Acid reflux   . Anemia   . Fibroids   . Heart murmur   . UTI (lower urinary tract infection)     Patient Active Problem List   Diagnosis Date Noted  . Tibial plateau fracture, right 05/09/2020  . Epigastric pain 07/03/2013  . Anemia 07/03/2013  . Migraine 07/03/2013  . Mood disorder ([place]) 02/15/2011  . Iron deficiency anemia 02/15/2011  . Uterine fibroid 02/15/2011  . GERD (gastroesophageal reflux disease) 02/15/2011    Past Surgical History:  Procedure Laterality Date  . ceaserian    . CESAREAN SECTION    . CESAREAN SECTION    . KNEE SURGERY    . KNEE SURGERY      OB History   No obstetric history on file.      Home Medications    Prior to Admission medications   Medication Sig Start Date End Date Taking? Authorizing Provider  Capsaicin-Menthol-Methyl Sal (CAPSAICIN-METHYL SAL-MENTHOL) 0.025-1-12 % CREA Apply 1 application topically 3 (three) times daily as needed (as needed for muscle pain). 03/12/19   Noe Gens, PA-C  celecoxib (CELEBREX) 200 MG capsule Take 1 capsule (200 mg total) by mouth 2 (two) times daily. 04/11/20   Margarita Mail, PA-C  cyclobenzaprine (FLEXERIL) 10 MG tablet Take 1 tablet (10 mg total) by mouth at bedtime. 02/10/19   Melynda Ripple, MD  cyclobenzaprine  (FLEXERIL) 10 MG tablet Take 1 tablet (10 mg total) by mouth 2 (two) times daily as needed for muscle spasms. 09/07/19   Loura Halt A, NP  Diclofenac Sodium (PENNSAID) 2 % SOLN Place 1 application onto the skin 2 (two) times daily. 05/09/20   Rosemarie Ax, MD  fluconazole (DIFLUCAN) 150 MG tablet Take 1 tablet (150 mg total) by mouth daily. 09/07/19   Loura Halt A, NP  HYDROcodone-acetaminophen (NORCO/VICODIN) 5-325 MG tablet Take 1 tablet by mouth every 6 (six) hours as needed for severe pain. 06/24/20   Nuala Alpha A, PA-C  ibuprofen (ADVIL) 800 MG tablet Take 1 tablet (800 mg total) by mouth 3 (three) times daily. 06/06/20   Sponseller, Eugene Garnet R, PA-C  pantoprazole (PROTONIX) 20 MG tablet Take 1 tablet (20 mg total) by mouth daily. 03/12/19 04/11/19  Noe Gens, PA-C  predniSONE (STERAPRED UNI-PAK 21 TAB) 10 MG (21) TBPK tablet 6 tabs for 1 day, then 5 tabs for 1 das, then 4 tabs for 1 day, then 3 tabs for 1 day, 2 tabs for 1 day, then 1 tab for 1 day 09/07/19   Orvan July, NP    Family History Family History  Problem Relation Age of Onset  . Hypertension Other   . Cancer Other   . Alcohol abuse Mother   . Heart disease Mother   .  Alcohol abuse Father   . Heart disease Father   . Arthritis Maternal Grandmother   . Arthritis Maternal Grandfather   . Arthritis Paternal Grandmother   . Arthritis Paternal Grandfather     Social History Social History   Tobacco Use  . Smoking status: Never Smoker  . Smokeless tobacco: Never Used  Vaping Use  . Vaping Use: Never used  Substance Use Topics  . Alcohol use: No  . Drug use: No     Allergies   Penicillins and Penicillin g   Review of Systems Review of Systems  Constitutional: Positive for fever. Negative for activity change, appetite change, chills, diaphoresis, fatigue and unexpected weight change.  HENT: Positive for congestion and rhinorrhea. Negative for dental problem, drooling, ear discharge, ear pain, facial  swelling, hearing loss, mouth sores, nosebleeds, postnasal drip, sinus pressure, sinus pain, sneezing, sore throat, tinnitus, trouble swallowing and voice change.   Eyes: Negative.   Respiratory: Positive for cough, chest tightness, shortness of breath and wheezing. Negative for apnea, choking and stridor.   Cardiovascular: Negative.   Gastrointestinal: Negative.   Endocrine: Negative.   Genitourinary: Negative.   Musculoskeletal: Negative.   Skin: Negative.   Neurological: Negative.   Psychiatric/Behavioral: Negative.      Physical Exam Triage Vital Signs ED Triage Vitals  Enc Vitals Group     BP 09/08/20 1139 105/63     Pulse Rate 09/08/20 1139 (!) 103     Resp 09/08/20 1139 12     Temp 09/08/20 1139 98.5 F (36.9 C)     Temp Source 09/08/20 1139 Oral     SpO2 09/08/20 1139 98 %     Weight --      Height --      Head Circumference --      Peak Flow --      Pain Score 09/08/20 1138 8     Pain Loc --      Pain Edu? --      Excl. in [place]? --    No data found.  Updated Vital Signs BP 105/63 (BP Location: Left Arm)   Pulse (!) 103   Temp 98.5 F (36.9 C) (Oral)   Resp 12   LMP  (LMP Unknown)   SpO2 98%   Visual Acuity Right Eye Distance:   Left Eye Distance:   Bilateral Distance:    Right Eye Near:   Left Eye Near:    Bilateral Near:     Physical Exam Constitutional:      Appearance: She is well-developed. She is obese.  HENT:     Head: Normocephalic.  Eyes:     Extraocular Movements: Extraocular movements intact.  Neck:     Thyroid: No thyromegaly.     Vascular: No hepatojugular reflux.     Trachea: Trachea normal.  Cardiovascular:     Rate and Rhythm: Regular rhythm. Tachycardia present.     Pulses: Normal pulses.  Pulmonary:     Effort: Pulmonary effort is normal.     Breath sounds: Examination of the right-middle field reveals wheezing. Examination of the left-middle field reveals wheezing. Examination of the right-lower field reveals wheezing.  Examination of the left-lower field reveals wheezing. Wheezing present.  Abdominal:     General: Bowel sounds are normal.     Palpations: Abdomen is soft.  Musculoskeletal:        General: Normal range of motion.     Cervical back: Normal range of motion.  Lymphadenopathy:  Cervical: Cervical adenopathy present.     Right cervical: Superficial cervical adenopathy present.     Left cervical: Superficial cervical adenopathy present.  Skin:    General: Skin is warm and dry.  Neurological:     General: No focal deficit present.     Mental Status: She is alert and oriented to person, place, and time.  Psychiatric:        Mood and Affect: Mood normal.        Behavior: Behavior normal.      UC Treatments / Results  Labs (all labs ordered are listed, but only abnormal results are displayed) Labs Reviewed - No data to display  EKG   Radiology No results found.  Procedures Procedures (including critical care time)  Medications Ordered in UC Medications - No data to display  Initial Impression / Assessment and Plan / UC Course  I have reviewed the triage vital signs and the nursing notes.  Pertinent labs & imaging results that were available during my care of the patient were reviewed by me and considered in my medical decision making (see chart for details).  Viral Upper Respiratory Infection with cough  1. Pending Covid results from different facility, did not retest.  2. Chest x-ray negative for acute disease 3. Albuterol Inhaler prescribed for use every 4-6 hours 4. Tessalon pearls tid and Promethazine DM at bedtime for cough 5. Prednisone 40mg  daily for 5 days to further assist with SOB and wheezing.   Final Clinical Impressions(s) / UC Diagnoses   Final diagnoses:  None   Discharge Instructions   None    ED Prescriptions    None     PDMP not reviewed this encounter.   Hans Eden, [place] 09/08/20 [phone]

## 2020-09-08 NOTE — ED Triage Notes (Signed)
C/o SOB, wheezing, coughing, runny nose, headaches X 3 days. Pt states she has been taking OTC medications with no relief. Pt states she has chest tightness when she coughs.

## 2020-09-26 ENCOUNTER — Emergency Department (HOSPITAL_COMMUNITY): Payer: BLUE CROSS/BLUE SHIELD

## 2020-09-26 ENCOUNTER — Other Ambulatory Visit: Payer: Self-pay

## 2020-09-26 ENCOUNTER — Encounter (HOSPITAL_COMMUNITY): Payer: Self-pay | Admitting: Emergency Medicine

## 2020-09-26 ENCOUNTER — Emergency Department (HOSPITAL_COMMUNITY)
Admission: EM | Admit: 2020-09-26 | Discharge: 2020-09-26 | Disposition: A | Discharge status: Home / Self Care | Payer: BLUE CROSS/BLUE SHIELD | Attending: Emergency Medicine | Admitting: Emergency Medicine

## 2020-09-26 DIAGNOSIS — W25XXXA Contact with sharp glass, initial encounter: Secondary | ICD-10-CM | POA: Diagnosis not present

## 2020-09-26 DIAGNOSIS — S61511A Laceration without foreign body of right wrist, initial encounter: Secondary | ICD-10-CM | POA: Insufficient documentation

## 2020-09-26 DIAGNOSIS — S6991XA Unspecified injury of right wrist, hand and finger(s), initial encounter: Secondary | ICD-10-CM | POA: Diagnosis present

## 2020-09-26 DIAGNOSIS — Y92838 Other recreation area as the place of occurrence of the external cause: Secondary | ICD-10-CM | POA: Diagnosis not present

## 2020-09-26 MED ORDER — LIDOCAINE HCL (PF) 1 % IJ SOLN
5.0000 mL | Freq: Once | INTRAMUSCULAR | Status: AC
  Administered 2020-09-26: 5 mL via INTRADERMAL
  Filled 2020-09-26: qty 5

## 2020-09-26 NOTE — Discharge Instructions (Addendum)
You can take 600 mg of ibuprofen every 6 hours, you can take 1000 mg of Tylenol every 6 hours, you can alternate these every 3 or you can take them together.  For wound care keep the wound clean and dry.  Keep the dressing on the pipeline for 24 hours.  After that you may remove the dressing and keep it clean with warm soapy water.  Blot dry.  You can then keep it covered to keep it clean with a Band-Aid.  Follow-up in 7 days for suture removal at urgent care or your primary care office or at the emergency department.  Look out for signs of infection to include significant redness beyond the edges of the wound redness streaking up the arm or pus draining from the wound.
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
## 2020-09-26 NOTE — ED Notes (Signed)
Pt returned from xray

## 2020-09-26 NOTE — ED Notes (Signed)
Pt transported to xray 
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
## 2020-10-03 ENCOUNTER — Ambulatory Visit (HOSPITAL_COMMUNITY)
Admission: EM | Admit: 2020-10-03 | Discharge: 2020-10-03 | Disposition: A | Discharge status: Home / Self Care | Payer: BLUE CROSS/BLUE SHIELD

## 2020-10-03 ENCOUNTER — Encounter (HOSPITAL_COMMUNITY): Payer: Self-pay | Admitting: Emergency Medicine

## 2020-10-03 ENCOUNTER — Other Ambulatory Visit: Payer: Self-pay

## 2020-10-03 DIAGNOSIS — Z5189 Encounter for other specified aftercare: Secondary | ICD-10-CM | POA: Diagnosis not present

## 2020-10-03 NOTE — ED Triage Notes (Signed)
Pt presents for suture removal on right wrist. States has mild pain, denies drainage, or warm to touch.

## 2020-10-03 NOTE — ED Provider Notes (Signed)
[place]    CSN: [id number] Arrival date & time: 10/03/20  1816      History   Chief Complaint Chief Complaint  Patient presents with  . Suture / Staple Removal    HPI Karen Lutz is a 50 y.o. female.   50 year old female patient presents to urgent care for evaluation of right wrist laceration wound, removal sutures.  Sutures were placed 6 days ago, patient states she has been using antibiotic bandage and it is still moist around the area.  The history is provided by the patient. No language interpreter was used.    Past Medical History:  Diagnosis Date  . Acid reflux   . Anemia   . Fibroids   . Heart murmur   . UTI (lower urinary tract infection)     Patient Active Problem List   Diagnosis Date Noted  . Visit for wound check 10/03/2020  . Tibial plateau fracture, right 05/09/2020  . Epigastric pain 07/03/2013  . Anemia 07/03/2013  . Migraine 07/03/2013  . Mood disorder ([place]) 02/15/2011  . Iron deficiency anemia 02/15/2011  . Uterine fibroid 02/15/2011  . GERD (gastroesophageal reflux disease) 02/15/2011    Past Surgical History:  Procedure Laterality Date  . ceaserian    . CESAREAN SECTION    . CESAREAN SECTION    . KNEE SURGERY    . KNEE SURGERY      OB History   No obstetric history on file.      Home Medications    Prior to Admission medications   Medication Sig Start Date End Date Taking? Authorizing Provider  albuterol (VENTOLIN HFA) 108 (90 Base) MCG/ACT inhaler Inhale 2 puffs into the lungs every 4 (four) hours as needed for wheezing or shortness of breath. 09/08/20   White, Leitha Schuller, NP  benzonatate (TESSALON) 100 MG capsule Take 1 capsule (100 mg total) by mouth every 8 (eight) hours. 09/08/20   Hans Eden, NP  Capsaicin-Menthol-Methyl Sal (CAPSAICIN-METHYL SAL-MENTHOL) 0.025-1-12 % CREA Apply 1 application topically 3 (three) times daily as needed (as needed for muscle pain). 03/12/19   Noe Gens, PA-C   celecoxib (CELEBREX) 200 MG capsule Take 1 capsule (200 mg total) by mouth 2 (two) times daily. 04/11/20   Margarita Mail, PA-C  cyclobenzaprine (FLEXERIL) 10 MG tablet Take 1 tablet (10 mg total) by mouth at bedtime. 02/10/19   Melynda Ripple, MD  cyclobenzaprine (FLEXERIL) 10 MG tablet Take 1 tablet (10 mg total) by mouth 2 (two) times daily as needed for muscle spasms. 09/07/19   Loura Halt A, NP  Diclofenac Sodium (PENNSAID) 2 % SOLN Place 1 application onto the skin 2 (two) times daily. 05/09/20   Rosemarie Ax, MD  fluconazole (DIFLUCAN) 150 MG tablet Take 1 tablet (150 mg total) by mouth daily. 09/07/19   Loura Halt A, NP  HYDROcodone-acetaminophen (NORCO/VICODIN) 5-325 MG tablet Take 1 tablet by mouth every 6 (six) hours as needed for severe pain. 06/24/20   Nuala Alpha A, PA-C  ibuprofen (ADVIL) 800 MG tablet Take 1 tablet (800 mg total) by mouth 3 (three) times daily. 06/06/20   Sponseller, Eugene Garnet R, PA-C  pantoprazole (PROTONIX) 20 MG tablet Take 1 tablet (20 mg total) by mouth daily. 03/12/19 04/11/19  Noe Gens, PA-C  predniSONE (DELTASONE) 10 MG tablet Take 4 tablets (40 mg total) by mouth daily. 09/08/20   White, Leitha Schuller, NP  predniSONE (STERAPRED UNI-PAK 21 TAB) 10 MG (21) TBPK tablet 6 tabs for 1  day, then 5 tabs for 1 das, then 4 tabs for 1 day, then 3 tabs for 1 day, 2 tabs for 1 day, then 1 tab for 1 day 09/07/19   Loura Halt A, NP  promethazine-dextromethorphan (PROMETHAZINE-DM) 6.25-15 MG/5ML syrup Take 5 mLs by mouth at bedtime as needed for cough. 09/08/20   Hans Eden, NP    Family History Family History  Problem Relation Age of Onset  . Hypertension Other   . Cancer Other   . Alcohol abuse Mother   . Heart disease Mother   . Alcohol abuse Father   . Heart disease Father   . Arthritis Maternal Grandmother   . Arthritis Maternal Grandfather   . Arthritis Paternal Grandmother   . Arthritis Paternal Grandfather     Social History Social History    Tobacco Use  . Smoking status: Never Smoker  . Smokeless tobacco: Never Used  Vaping Use  . Vaping Use: Never used  Substance Use Topics  . Alcohol use: No  . Drug use: No     Allergies   Penicillins and Penicillin g   Review of Systems Review of Systems  Skin: Positive for wound.  All other systems reviewed and are negative.    Physical Exam Triage Vital Signs ED Triage Vitals  Enc Vitals Group     BP 10/03/20 1909 (!) 153/91     Pulse Rate 10/03/20 1909 99     Resp 10/03/20 1909 19     Temp 10/03/20 1909 97.8 F (36.6 C)     Temp Source 10/03/20 1909 Oral     SpO2 10/03/20 1909 100 %     Weight --      Height --      Head Circumference --      Peak Flow --      Pain Score 10/03/20 1907 2     Pain Loc --      Pain Edu? --      Excl. in [place]? --    No data found.  Updated Vital Signs BP (!) 153/91 (BP Location: Left Wrist)   Pulse 99   Temp 97.8 F (36.6 C) (Oral)   Resp 19   LMP  (LMP Unknown)   SpO2 100%   Visual Acuity Right Eye Distance:   Left Eye Distance:   Bilateral Distance:    Right Eye Near:   Left Eye Near:    Bilateral Near:     Physical Exam Vitals and nursing note reviewed.  Constitutional:      General: She is not in acute distress.    Appearance: She is well-developed and well-nourished.  HENT:     Head: Normocephalic and atraumatic.  Eyes:     Conjunctiva/sclera: Conjunctivae normal.  Cardiovascular:     Rate and Rhythm: Normal rate and regular rhythm.     Heart sounds: No murmur heard.   Pulmonary:     Effort: Pulmonary effort is normal. No respiratory distress.     Breath sounds: Normal breath sounds.  Abdominal:     Palpations: Abdomen is soft.     Tenderness: There is no abdominal tenderness.  Musculoskeletal:        General: No edema.     Cervical back: Neck supple.  Skin:    General: Skin is warm and dry.     Capillary Refill: Capillary refill takes less than 2 seconds.     Findings: Laceration present.      Comments: Right inner wrist laceration  with 5 sutures noted, one area is open not approximated.  No purulent drainage noted, tenderness at site no erythema.  Neurological:     General: No focal deficit present.     Mental Status: She is alert.     GCS: GCS eye subscore is 4. GCS verbal subscore is 5. GCS motor subscore is 6.  Psychiatric:        Mood and Affect: Mood and affect and mood normal.        Behavior: Behavior normal.      UC Treatments / Results  Labs (all labs ordered are listed, but only abnormal results are displayed) Labs Reviewed - No data to display  EKG   Radiology No results found.  Procedures Procedures (including critical care time)  Medications Ordered in UC Medications - No data to display  Initial Impression / Assessment and Plan / UC Course  I have reviewed the triage vital signs and the nursing notes.  Pertinent labs & imaging results that were available during my care of the patient were reviewed by me and considered in my medical decision making (see chart for details).     Advised patient to wait 3 more days on the suture removal.  Patient verbalized understanding to this provider Final Clinical Impressions(s) / UC Diagnoses   Final diagnoses:  Visit for wound check     Discharge Instructions     Keep wound clean and dry. Return in 3 days for suture removal or follow up with your PCP for suture removal.    ED Prescriptions    None     PDMP not reviewed this encounter.   Tori Milks, NP 89/38/10 2024

## 2020-10-03 NOTE — Discharge Instructions (Addendum)
Keep wound clean and dry. Return in 3 days for suture removal or follow up with your PCP for suture removal.

## 2020-10-06 ENCOUNTER — Other Ambulatory Visit: Payer: Self-pay

## 2020-10-06 ENCOUNTER — Ambulatory Visit (HOSPITAL_COMMUNITY)
Admission: RE | Admit: 2020-10-06 | Discharge: 2020-10-06 | Disposition: A | Discharge status: Home / Self Care | Payer: BLUE CROSS/BLUE SHIELD | Source: Ambulatory Visit | Attending: Student | Admitting: Student

## 2020-10-06 ENCOUNTER — Encounter (HOSPITAL_COMMUNITY): Payer: Self-pay

## 2020-10-06 VITALS — BP 128/73 | HR 100 | Temp 97.8°F | Resp 20

## 2020-10-06 DIAGNOSIS — M25561 Pain in right knee: Secondary | ICD-10-CM | POA: Diagnosis not present

## 2020-10-06 DIAGNOSIS — Z8781 Personal history of (healed) traumatic fracture: Secondary | ICD-10-CM

## 2020-10-06 DIAGNOSIS — Z4802 Encounter for removal of sutures: Secondary | ICD-10-CM

## 2020-10-06 DIAGNOSIS — Z6841 Body Mass Index (BMI) 40.0 and over, adult: Secondary | ICD-10-CM

## 2020-10-06 DIAGNOSIS — J029 Acute pharyngitis, unspecified: Secondary | ICD-10-CM

## 2020-10-06 DIAGNOSIS — G43709 Chronic migraine without aura, not intractable, without status migrainosus: Secondary | ICD-10-CM

## 2020-10-06 DIAGNOSIS — Z5189 Encounter for other specified aftercare: Secondary | ICD-10-CM | POA: Diagnosis not present

## 2020-10-06 DIAGNOSIS — G8929 Other chronic pain: Secondary | ICD-10-CM

## 2020-10-06 MED ORDER — DM-GUAIFENESIN ER 30-600 MG PO TB12: 1.0000 | ORAL_TABLET | Freq: Two times a day (BID) | ORAL | 0 refills | Status: DC

## 2020-10-06 MED ORDER — DOXYCYCLINE HYCLATE 100 MG PO CAPS: 100.0000 mg | ORAL_CAPSULE | Freq: Two times a day (BID) | ORAL | 0 refills | Status: AC

## 2020-10-06 MED ORDER — PROMETHAZINE-DM 6.25-15 MG/5ML PO SYRP: 5.0000 mL | ORAL_SOLUTION | Freq: Four times a day (QID) | ORAL | 0 refills | Status: DC | PRN

## 2020-10-06 NOTE — Discharge Instructions (Signed)
-  Start the antibiotic-doxycycline, 2 pills taken daily for 7 days.  Try to avoid taking this within 1 hour of eating. -Leave the Steri-Strips on for the next 3 to 4 days.  You can remove these at home at this time. -To care for your wound, gently wash this with soap and water 1-2 times a day.  You do not have to put antibiotic ointment on this anymore. -I also sent medicines for your cold symptoms.  Start the Mucinex DM as needed for congestion during the day.  I also sent Promethazine DM for cough and congestion at night.  This can make you drowsy, so do take at night before bed. -Continue to use Tylenol and knee brace for your knee pain.  Schedule follow-up appoint with your orthopedist for further evaluation/ management. -If you develop new symptoms like fever/chills, discharge from your wound, worst headache of life, acute vision changes, weakness or sensation changes in your arms or legs, worsening shortness of breath, chest pain, abdominal pain, etc.-seek immediate medical attention.

## 2020-10-06 NOTE — ED Provider Notes (Signed)
[place]    CSN: [id number] Arrival date & time: 10/06/20  7824      History   Chief Complaint Chief Complaint  Patient presents with  . Suture / Staple Removal  . Sore Throat    HPI Karen Lutz is a 50 y.o. female presenting for suture removal, sore throat, knee pain, headaches, shortness of breath.  History GERD, anemia, fibroids, heart murmur, UTI, right tibial plateau fracture. -This patient had 5 sutures placed on 2/21 (10 days ago) for a 2 cm jagged laceration on her distal right forearm.  Today presenting for removal.  States the area is still feeling numb around her laceration.  No difficulty moving her wrist or fingers.  Denies discharge.  States she is using bacitracin ointment at home, most recently used last night. -She also states that she has had a sore throat, congestion, headache, shortness of breath for 1 day.  Long history of migraine headaches, states this feels just like them.  Denies history of asthma, but states that she has an albuterol inhaler that she is using with relief of shortness of breath. Denies worst headache of life, thunderclap headache, weakness/sensation changes in arms/legs, vision changes, shortness of breath, chest pain/pressure, photophobia, phonophobia, n/v/d. Fully vaccinated for covid19 and states she gets regularly tested for covid at work. -Patient with history of chronic right knee pain following tibial plateau fracture.  Has been fully evaluated by Ortho, but is stating she needs relief of this chronic pain today.  States that she was told to get an MRI done and then follow-up with her orthopedist, which she has not done.  She has been using Tylenol and knee brace at home without relief.  States she cannot take any NSAIDs due to stomach upset with these.  Denies new trauma, sensation changes.    HPI  Past Medical History:  Diagnosis Date  . Acid reflux   . Anemia   . Fibroids   . Heart murmur   . UTI (lower urinary  tract infection)     Patient Active Problem List   Diagnosis Date Noted  . Visit for wound check 10/03/2020  . Tibial plateau fracture, right 05/09/2020  . Epigastric pain 07/03/2013  . Anemia 07/03/2013  . Migraine 07/03/2013  . Mood disorder ([place]) 02/15/2011  . Iron deficiency anemia 02/15/2011  . Uterine fibroid 02/15/2011  . GERD (gastroesophageal reflux disease) 02/15/2011    Past Surgical History:  Procedure Laterality Date  . ceaserian    . CESAREAN SECTION    . CESAREAN SECTION    . KNEE SURGERY    . KNEE SURGERY      OB History   No obstetric history on file.      Home Medications    Prior to Admission medications   Medication Sig Start Date End Date Taking? Authorizing Provider  dextromethorphan-guaiFENesin (MUCINEX DM) 30-600 MG 12hr tablet Take 1 tablet by mouth 2 (two) times daily. 10/06/20  Yes Hazel Sams, PA-C  doxycycline (VIBRAMYCIN) 100 MG capsule Take 1 capsule (100 mg total) by mouth 2 (two) times daily for 7 days. 10/06/20 10/13/20 Yes Hazel Sams, PA-C  promethazine-dextromethorphan (PROMETHAZINE-DM) 6.25-15 MG/5ML syrup Take 5 mLs by mouth 4 (four) times daily as needed for cough. 10/06/20  Yes Hazel Sams, PA-C  albuterol (VENTOLIN HFA) 108 (90 Base) MCG/ACT inhaler Inhale 2 puffs into the lungs every 4 (four) hours as needed for wheezing or shortness of breath. 09/08/20   White, Leitha Schuller,  NP  benzonatate (TESSALON) 100 MG capsule Take 1 capsule (100 mg total) by mouth every 8 (eight) hours. 09/08/20   Hans Eden, NP  Capsaicin-Menthol-Methyl Sal (CAPSAICIN-METHYL SAL-MENTHOL) 0.025-1-12 % CREA Apply 1 application topically 3 (three) times daily as needed (as needed for muscle pain). 03/12/19   Noe Gens, PA-C  celecoxib (CELEBREX) 200 MG capsule Take 1 capsule (200 mg total) by mouth 2 (two) times daily. 04/11/20   Margarita Mail, PA-C  cyclobenzaprine (FLEXERIL) 10 MG tablet Take 1 tablet (10 mg total) by mouth at bedtime. 02/10/19    Melynda Ripple, MD  cyclobenzaprine (FLEXERIL) 10 MG tablet Take 1 tablet (10 mg total) by mouth 2 (two) times daily as needed for muscle spasms. 09/07/19   Loura Halt A, NP  Diclofenac Sodium (PENNSAID) 2 % SOLN Place 1 application onto the skin 2 (two) times daily. 05/09/20   Rosemarie Ax, MD  fluconazole (DIFLUCAN) 150 MG tablet Take 1 tablet (150 mg total) by mouth daily. 09/07/19   Loura Halt A, NP  HYDROcodone-acetaminophen (NORCO/VICODIN) 5-325 MG tablet Take 1 tablet by mouth every 6 (six) hours as needed for severe pain. 06/24/20   Nuala Alpha A, PA-C  ibuprofen (ADVIL) 800 MG tablet Take 1 tablet (800 mg total) by mouth 3 (three) times daily. 06/06/20   Sponseller, Eugene Garnet R, PA-C  pantoprazole (PROTONIX) 20 MG tablet Take 1 tablet (20 mg total) by mouth daily. 03/12/19 04/11/19  Noe Gens, PA-C  predniSONE (DELTASONE) 10 MG tablet Take 4 tablets (40 mg total) by mouth daily. 09/08/20   White, Leitha Schuller, NP  predniSONE (STERAPRED UNI-PAK 21 TAB) 10 MG (21) TBPK tablet 6 tabs for 1 day, then 5 tabs for 1 das, then 4 tabs for 1 day, then 3 tabs for 1 day, 2 tabs for 1 day, then 1 tab for 1 day 09/07/19   Orvan July, NP    Family History Family History  Problem Relation Age of Onset  . Hypertension Other   . Cancer Other   . Alcohol abuse Mother   . Heart disease Mother   . Alcohol abuse Father   . Heart disease Father   . Arthritis Maternal Grandmother   . Arthritis Maternal Grandfather   . Arthritis Paternal Grandmother   . Arthritis Paternal Grandfather     Social History Social History   Tobacco Use  . Smoking status: Never Smoker  . Smokeless tobacco: Never Used  Vaping Use  . Vaping Use: Never used  Substance Use Topics  . Alcohol use: No  . Drug use: No     Allergies   Penicillins and Penicillin g   Review of Systems Review of Systems  Constitutional: Negative for appetite change, chills and fever.  HENT: Positive for congestion and sore throat.  Negative for ear pain, rhinorrhea, sinus pressure and sinus pain.   Eyes: Negative for redness and visual disturbance.  Respiratory: Positive for cough and shortness of breath. Negative for chest tightness and wheezing.   Cardiovascular: Negative for chest pain and palpitations.  Gastrointestinal: Negative for abdominal pain, constipation, diarrhea, nausea and vomiting.  Genitourinary: Negative for dysuria, frequency and urgency.  Musculoskeletal: Negative for back pain and myalgias.       R knee pain   Skin: Positive for wound.  Neurological: Positive for headaches. Negative for dizziness, tremors, seizures, syncope, facial asymmetry, speech difficulty, weakness, light-headedness and numbness.  Psychiatric/Behavioral: Negative for confusion.  All other systems reviewed and are negative.  Physical Exam Triage Vital Signs ED Triage Vitals [10/06/20 0954]  Enc Vitals Group     BP      Pulse      Resp      Temp      Temp src      SpO2      Weight      Height      Head Circumference      Peak Flow      Pain Score 7     Pain Loc      Pain Edu?      Excl. in [place]?    No data found.  Updated Vital Signs BP 128/73 (BP Location: Left Arm)   Pulse 100   Temp 97.8 F (36.6 C) (Oral)   Resp 20   LMP  (LMP Unknown)   SpO2 98%   Visual Acuity Right Eye Distance:   Left Eye Distance:   Bilateral Distance:    Right Eye Near:   Left Eye Near:    Bilateral Near:     Physical Exam Vitals reviewed.  Constitutional:      General: She is not in acute distress.    Appearance: Normal appearance. She is obese. She is not ill-appearing.  HENT:     Head: Normocephalic and atraumatic.     Comments: Smooth erythema posterior pharynx    Right Ear: Hearing, tympanic membrane, ear canal and external ear normal. No swelling or tenderness. There is no impacted cerumen. No mastoid tenderness. Tympanic membrane is not perforated, erythematous, retracted or bulging.     Left Ear: Hearing,  tympanic membrane, ear canal and external ear normal. No swelling or tenderness. There is no impacted cerumen. No mastoid tenderness. Tympanic membrane is not perforated, erythematous, retracted or bulging.     Nose:     Right Sinus: No maxillary sinus tenderness or frontal sinus tenderness.     Left Sinus: No maxillary sinus tenderness or frontal sinus tenderness.     Mouth/Throat:     Mouth: Mucous membranes are moist.     Pharynx: Uvula midline. No oropharyngeal exudate or posterior oropharyngeal erythema.     Tonsils: No tonsillar exudate.  Cardiovascular:     Rate and Rhythm: Normal rate and regular rhythm.     Heart sounds: Normal heart sounds.  Pulmonary:     Breath sounds: Normal breath sounds and air entry. No wheezing, rhonchi or rales.  Chest:     Chest wall: No tenderness.  Abdominal:     General: Abdomen is protuberant. Bowel sounds are normal.     Tenderness: There is no abdominal tenderness. There is no guarding or rebound.  Musculoskeletal:     Comments: R knee with crepitus and pain with extension and flexion. Strength and sensation intact.  Lymphadenopathy:     Cervical: No cervical adenopathy.  Skin:    Comments: See image below. Distal right forearm with 2cm laceration with surrounding erythema and numbness (unchanged). No warmth or discharge. Sutures removed, but given poor wound healing, steri strips placed.   Neurological:     General: No focal deficit present.     Mental Status: She is alert and oriented to person, place, and time.     Comments: PERRLA, EOMI, CN 2-12 grossly intact  Psychiatric:        Attention and Perception: Attention and perception normal.        Mood and Affect: Mood and affect normal.        Behavior: Behavior  normal. Behavior is cooperative.        Thought Content: Thought content normal.        Judgment: Judgment normal.         UC Treatments / Results  Labs (all labs ordered are listed, but only abnormal results are  displayed) Labs Reviewed - No data to display  EKG   Radiology No results found.  Procedures Procedures (including critical care time)  Medications Ordered in UC Medications - No data to display  Initial Impression / Assessment and Plan / UC Course  I have reviewed the triage vital signs and the nursing notes.  Pertinent labs & imaging results that were available during my care of the patient were reviewed by me and considered in my medical decision making (see chart for details).      This patient is a 50 year old female presenting for multiple complaints: suture removal; chronic right knee pain; and URI symptoms/sore throat. Today she is  afebrile nontachycardic nontachypneic, oxygenating well on room air, no wheezes rhonchi or rales.  -For viral URI, promethazine and Mucinex DM sent as below.  She declines Covid test.  She is fully vaccinated for Covid.  Isolation as per CDC guidelines.  Continue albuterol inhaler. -For migraine headaches, continue tylenol prn. Red flag symptoms discussed. -For chronic right knee pain and history of tibial plateau fracture, she did not follow-up with her orthopedist as directed.  She is also unable to take NSAIDs.  Discussed that for relief of this chronic pain, she must follow-up with her orthopedist.  Suspect chronic pain is also related to BMI of 46.6. She verbalizes understanding. -5 sutures were removed today.  Wound is healing poorly, likely due to bacitracin use at home keeping the wound moist.  Steri-Strips applied today, which she can remove at home in 3 to 4 days.  Doxycycline sent for overlying infection. Wound care instructions discussed. -Return precautions discussed  I am coding this patient based on time today.  Spent over 50 minutes evaluating her multiple complaints, some acute and some chronic.  Time also spent evaluating the wound, applying Steri-Strips, discussing treatment plan with nurse and medical team.  This chart was  dictated using voice recognition software, Dragon. Despite the best efforts of this provider to proofread and correct errors, errors may still occur which can change documentation meaning.   Final Clinical Impressions(s) / UC Diagnoses   Final diagnoses:  Visit for wound check  Chronic pain of right knee  Encounter for removal of sutures  Viral pharyngitis  Chronic migraine without aura without status migrainosus, not intractable  History of tibial fracture  Class 3 severe obesity due to excess calories with body mass index (BMI) of 45.0 to 49.9 in adult, unspecified whether serious comorbidity present [facility])     Discharge Instructions     -Start the antibiotic-doxycycline, 2 pills taken daily for 7 days.  Try to avoid taking this within 1 hour of eating. -Leave the Steri-Strips on for the next 3 to 4 days.  You can remove these at home at this time. -To care for your wound, gently wash this with soap and water 1-2 times a day.  You do not have to put antibiotic ointment on this anymore. -I also sent medicines for your cold symptoms.  Start the Mucinex DM as needed for congestion during the day.  I also sent Promethazine DM for cough and congestion at night.  This can make you drowsy, so do take at night before bed. -Continue  to use Tylenol and knee brace for your knee pain.  Schedule follow-up appoint with your orthopedist for further evaluation/ management. -If you develop new symptoms like fever/chills, discharge from your wound, worst headache of life, acute vision changes, weakness or sensation changes in your arms or legs, worsening shortness of breath, chest pain, abdominal pain, etc.-seek immediate medical attention.    ED Prescriptions    Medication Sig Dispense Auth. Provider   doxycycline (VIBRAMYCIN) 100 MG capsule Take 1 capsule (100 mg total) by mouth 2 (two) times daily for 7 days. 14 capsule Hazel Sams, PA-C   dextromethorphan-guaiFENesin [facility] DM) 30-600 MG 12hr  tablet Take 1 tablet by mouth 2 (two) times daily. 14 tablet Hazel Sams, PA-C   promethazine-dextromethorphan (PROMETHAZINE-DM) 6.25-15 MG/5ML syrup Take 5 mLs by mouth 4 (four) times daily as needed for cough. 118 mL Hazel Sams, PA-C     PDMP not reviewed this encounter.   Hazel Sams, PA-C 10/06/20 1051

## 2020-10-06 NOTE — ED Triage Notes (Signed)
Pt presents with multiple complaints. Presents for suture removal. Pt also c/o of sore throat and SOB xs 1 days.   States has hx of right knee pain. States fell a year ago but pain comes and goes. Has seen sports med for pain but has not followed up with them recently.

## 2020-10-12 ENCOUNTER — Telehealth: Payer: Self-pay | Admitting: Family Medicine

## 2020-10-12 DIAGNOSIS — S82141D Displaced bicondylar fracture of right tibia, subsequent encounter for closed fracture with routine healing: Secondary | ICD-10-CM

## 2020-10-12 NOTE — Telephone Encounter (Signed)
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  Patient request a new MRI order be sent to [place], she is still experiencing discomfort/ Rt knee pain --LOV 05/23/20 w/ Dr.Schmitz   --Forwarding message to provider .  --glh

## 2020-10-12 NOTE — Telephone Encounter (Signed)
Placed MRI since previous order was placed at a location out of network for her.   Rosemarie Ax, MD [facility] 10/12/2020, 1:49 PM

## 2020-10-13 NOTE — Telephone Encounter (Signed)
Right knee MRI order re-faxed along with most recent OV note and knee xrays to [phone].

## 2020-10-14 ENCOUNTER — Telehealth: Payer: Self-pay | Admitting: *Deleted

## 2020-10-14 NOTE — Telephone Encounter (Signed)
I called [facility] for MRI right knee w/o contrast precert. CPT code [phone].   A peer to peer is needed for authorization.  Dr. Raeford Razor needs to call [phone] and provide: Member's ID # [id number]

## 2020-10-18 NOTE — Telephone Encounter (Signed)
Josem Kaufmann #[id number]  Rosemarie Ax, MD [facility] 10/18/2020, 8:11 AM

## 2020-10-25 ENCOUNTER — Emergency Department (HOSPITAL_COMMUNITY)
Admission: EM | Admit: 2020-10-25 | Discharge: 2020-10-25 | Disposition: A | Discharge status: Home / Self Care | Payer: BLUE CROSS/BLUE SHIELD | Attending: Emergency Medicine | Admitting: Emergency Medicine

## 2020-10-25 ENCOUNTER — Emergency Department (HOSPITAL_COMMUNITY): Payer: BLUE CROSS/BLUE SHIELD

## 2020-10-25 ENCOUNTER — Other Ambulatory Visit: Payer: Self-pay

## 2020-10-25 ENCOUNTER — Encounter (HOSPITAL_COMMUNITY): Payer: Self-pay

## 2020-10-25 DIAGNOSIS — M25561 Pain in right knee: Secondary | ICD-10-CM | POA: Diagnosis present

## 2020-10-25 DIAGNOSIS — M1711 Unilateral primary osteoarthritis, right knee: Secondary | ICD-10-CM | POA: Insufficient documentation

## 2020-10-25 DIAGNOSIS — M25461 Effusion, right knee: Secondary | ICD-10-CM | POA: Diagnosis not present

## 2020-10-25 MED ORDER — KETOROLAC TROMETHAMINE 30 MG/ML IJ SOLN
60.0000 mg | Freq: Once | INTRAMUSCULAR | Status: AC
  Administered 2020-10-25: 60 mg via INTRAMUSCULAR
  Filled 2020-10-25: qty 2

## 2020-10-25 MED ORDER — AEROCHAMBER Z-STAT PLUS/MEDIUM MISC
Status: AC
  Filled 2020-10-25: qty 1

## 2020-10-25 MED ORDER — LIDOCAINE HCL (PF) 1 % IJ SOLN
5.0000 mL | Freq: Once | INTRAMUSCULAR | Status: AC
  Administered 2020-10-25: 5 mL
  Filled 2020-10-25: qty 30

## 2020-10-25 MED ORDER — TRIAMCINOLONE ACETONIDE 40 MG/ML IJ SUSP
80.0000 mg | Freq: Once | INTRAMUSCULAR | Status: AC
  Administered 2020-10-25: 80 mg via INTRA_ARTICULAR
  Filled 2020-10-25: qty 2

## 2020-10-25 NOTE — ED Triage Notes (Signed)
Patient c/o right knee pain and swelling x 1 year. Patient states it is constant pain and swelling.

## 2020-10-25 NOTE — ED Notes (Signed)
Patient transported to X-ray 
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
## 2020-10-29 ENCOUNTER — Other Ambulatory Visit: Payer: BLUE CROSS/BLUE SHIELD

## 2020-10-30 ENCOUNTER — Emergency Department (HOSPITAL_COMMUNITY): Payer: BLUE CROSS/BLUE SHIELD

## 2020-10-30 ENCOUNTER — Emergency Department (HOSPITAL_COMMUNITY)
Admission: EM | Admit: 2020-10-30 | Discharge: 2020-10-30 | Disposition: A | Discharge status: Home / Self Care | Payer: BLUE CROSS/BLUE SHIELD | Attending: Emergency Medicine | Admitting: Emergency Medicine

## 2020-10-30 ENCOUNTER — Encounter (HOSPITAL_COMMUNITY): Payer: Self-pay | Admitting: Emergency Medicine

## 2020-10-30 DIAGNOSIS — R1084 Generalized abdominal pain: Secondary | ICD-10-CM | POA: Diagnosis present

## 2020-10-30 DIAGNOSIS — R102 Pelvic and perineal pain: Secondary | ICD-10-CM | POA: Diagnosis not present

## 2020-10-30 DIAGNOSIS — K219 Gastro-esophageal reflux disease without esophagitis: Secondary | ICD-10-CM | POA: Diagnosis not present

## 2020-10-30 LAB — CBC
HCT: 41 % (ref 36.0–46.0)
Hemoglobin: 12.4 g/dL (ref 12.0–15.0)
MCH: 24 pg — ABNORMAL LOW (ref 26.0–34.0)
MCHC: 30.2 g/dL (ref 30.0–36.0)
MCV: 79.5 fL — ABNORMAL LOW (ref 80.0–100.0)
Platelets: 332 10*3/uL (ref 150–400)
RBC: 5.16 MIL/uL — ABNORMAL HIGH (ref 3.87–5.11)
RDW: 14.4 % (ref 11.5–15.5)
WBC: 12.4 10*3/uL — ABNORMAL HIGH (ref 4.0–10.5)
nRBC: 0 % (ref 0.0–0.2)

## 2020-10-30 LAB — HCG, QUANTITATIVE, PREGNANCY: hCG, Beta Chain, Quant, S: <1 m[IU]/mL (ref <5)

## 2020-10-30 LAB — COMPREHENSIVE METABOLIC PANEL
ALT: 9 U/L (ref 0–44)
AST: 13 U/L — ABNORMAL LOW (ref 15–41)
Albumin: 3.3 g/dL — ABNORMAL LOW (ref 3.5–5.0)
Alkaline Phosphatase: 60 U/L (ref 38–126)
Anion gap: 7 (ref 5–15)
BUN: 13 mg/dL (ref 6–20)
CO2: 26 mmol/L (ref 22–32)
Calcium: 9.1 mg/dL (ref 8.9–10.3)
Chloride: 107 mmol/L (ref 98–111)
Creatinine, Ser: 0.47 mg/dL (ref 0.44–1.00)
GFR, Estimated: >60 mL/min (ref >60)
Glucose, Bld: 90 mg/dL (ref 70–99)
Potassium: 4.3 mmol/L (ref 3.5–5.1)
Sodium: 140 mmol/L (ref 135–145)
Total Bilirubin: 0.3 mg/dL (ref 0.3–1.2)
Total Protein: 6.7 g/dL (ref 6.5–8.1)

## 2020-10-30 LAB — URINALYSIS, ROUTINE W REFLEX MICROSCOPIC
Bilirubin Urine: NEGATIVE
Glucose, UA: NEGATIVE mg/dL
Ketones, ur: NEGATIVE mg/dL
Leukocytes,Ua: NEGATIVE
Nitrite: NEGATIVE
Protein, ur: 30 mg/dL — AB
Specific Gravity, Urine: 1.017 (ref 1.005–1.030)
pH: 6 (ref 5.0–8.0)

## 2020-10-30 LAB — LIPASE, BLOOD: Lipase: 44 U/L (ref 11–51)

## 2020-10-30 MED ORDER — HYDROCODONE-ACETAMINOPHEN 5-325 MG PO TABS: 1.0000 | ORAL_TABLET | Freq: Three times a day (TID) | ORAL | 0 refills | Status: DC | PRN

## 2020-10-30 MED ORDER — IOHEXOL 300 MG/ML  SOLN
100.0000 mL | Freq: Once | INTRAMUSCULAR | Status: AC | PRN
  Administered 2020-10-30: 100 mL via INTRAVENOUS

## 2020-10-30 MED ORDER — ONDANSETRON HCL 4 MG/2ML IJ SOLN
4.0000 mg | Freq: Once | INTRAMUSCULAR | Status: AC
  Administered 2020-10-30: 4 mg via INTRAVENOUS
  Filled 2020-10-30: qty 2

## 2020-10-30 MED ORDER — HYDROMORPHONE HCL 1 MG/ML IJ SOLN
1.0000 mg | Freq: Once | INTRAMUSCULAR | Status: AC
Start: 2020-10-30 — End: 2020-10-30
  Administered 2020-10-30: 1 mg via INTRAVENOUS
  Filled 2020-10-30: qty 1

## 2020-10-30 NOTE — Discharge Instructions (Signed)
Take your hydrocodone as needed for pain.  Follow-up with your family doctor within a week to get your blood pressure checked again.  Get in touch with your gynecologist the  pain does not improve

## 2020-10-30 NOTE — ED Notes (Signed)
Pt transported to CT ?

## 2020-10-30 NOTE — ED Triage Notes (Signed)
Patient here from home reporting lower abd pain down into pelvis that started 3-4 days ago. Denies n/v.

## 2020-10-30 NOTE — ED Provider Notes (Signed)
[facility] DEPT Provider Note   CSN: [id number] Arrival date & time: 10/30/20  1221     History Chief Complaint  Patient presents with  . Abdominal Pain  . Pelvic Pain    Karen Lutz is a 50 y.o. female.  Patient with suprapubic and right lower quadrant abdominal pain.  Patient has fibroids in her uterus and she is cannot have surgery done in April  The history is provided by the patient and medical records. No language interpreter was used.  Abdominal Pain Pain location:  Generalized Pain quality: aching   Pain radiates to:  Does not radiate Pain severity:  Moderate Onset quality:  Sudden Timing:  Constant Associated symptoms: no chest pain, no cough, no diarrhea, no fatigue and no hematuria   Pelvic Pain Associated symptoms include abdominal pain. Pertinent negatives include no chest pain and no headaches.       Past Medical History:  Diagnosis Date  . Acid reflux   . Anemia   . Fibroids   . Heart murmur   . UTI (lower urinary tract infection)     Patient Active Problem List   Diagnosis Date Noted  . Visit for wound check 10/03/2020  . Tibial plateau fracture, right 05/09/2020  . Epigastric pain 07/03/2013  . Anemia 07/03/2013  . Migraine 07/03/2013  . Mood disorder ([place]) 02/15/2011  . Iron deficiency anemia 02/15/2011  . Uterine fibroid 02/15/2011  . GERD (gastroesophageal reflux disease) 02/15/2011    Past Surgical History:  Procedure Laterality Date  . ceaserian    . CESAREAN SECTION    . CESAREAN SECTION    . KNEE SURGERY    . KNEE SURGERY       OB History   No obstetric history on file.     Family History  Problem Relation Age of Onset  . Hypertension Other   . Cancer Other   . Alcohol abuse Mother   . Heart disease Mother   . Alcohol abuse Father   . Heart disease Father   . Arthritis Maternal Grandmother   . Arthritis Maternal Grandfather   . Arthritis Paternal Grandmother   . Arthritis  Paternal Grandfather     Social History   Tobacco Use  . Smoking status: Never Smoker  . Smokeless tobacco: Never Used  Vaping Use  . Vaping Use: Never used  Substance Use Topics  . Alcohol use: No  . Drug use: No    Home Medications Prior to Admission medications   Medication Sig Start Date End Date Taking? Authorizing Provider  HYDROcodone-acetaminophen (NORCO/VICODIN) 5-325 MG tablet Take 1 tablet by mouth every 6 (six) hours as needed for severe pain. Patient taking differently: Take 0.5 tablets by mouth every 6 (six) hours as needed for severe pain. 06/24/20  Yes Nuala Alpha A, PA-C  ibuprofen (ADVIL) 200 MG tablet Take 200-400 mg by mouth every 6 (six) hours as needed for fever, headache or mild pain.   Yes [provider]  norethindrone (AYGESTIN) 5 MG tablet Take 5 mg by mouth 2 (two) times daily. 10/03/20  Yes [provider]  albuterol (VENTOLIN HFA) 108 (90 Base) MCG/ACT inhaler Inhale 2 puffs into the lungs every 4 (four) hours as needed for wheezing or shortness of breath. Patient not taking: Reported on 10/30/2020 09/08/20   Hans Eden, NP  benzonatate (TESSALON) 100 MG capsule Take 1 capsule (100 mg total) by mouth every 8 (eight) hours. Patient not taking: Reported on 10/30/2020 09/08/20  White, Adrienne R, NP  dextromethorphan-guaiFENesin (MUCINEX DM) 30-600 MG 12hr tablet Take 1 tablet by mouth 2 (two) times daily. Patient not taking: Reported on 10/30/2020 10/06/20   Hazel Sams, PA-C  Diclofenac Sodium (PENNSAID) 2 % SOLN Place 1 application onto the skin 2 (two) times daily. Patient not taking: Reported on 10/30/2020 05/09/20   Rosemarie Ax, MD  ibuprofen (ADVIL) 800 MG tablet Take 1 tablet (800 mg total) by mouth 3 (three) times daily. Patient not taking: Reported on 10/30/2020 06/06/20   Sponseller, Gypsy Balsam, PA-C  predniSONE (DELTASONE) 10 MG tablet Take 4 tablets (40 mg total) by mouth daily. Patient not taking: Reported on  10/30/2020 09/08/20   Hans Eden, NP  promethazine-dextromethorphan (PROMETHAZINE-DM) 6.25-15 MG/5ML syrup Take 5 mLs by mouth 4 (four) times daily as needed for cough. Patient not taking: Reported on 10/30/2020 10/06/20   Hazel Sams, PA-C    Allergies    Penicillins and Penicillin g  Review of Systems   Review of Systems  Constitutional: Negative for appetite change and fatigue.  HENT: Negative for congestion, ear discharge and sinus pressure.   Eyes: Negative for discharge.  Respiratory: Negative for cough.   Cardiovascular: Negative for chest pain.  Gastrointestinal: Positive for abdominal pain. Negative for diarrhea.  Genitourinary: Positive for pelvic pain. Negative for frequency and hematuria.  Musculoskeletal: Negative for back pain.  Skin: Negative for rash.  Neurological: Negative for seizures and headaches.  Psychiatric/Behavioral: Negative for hallucinations.    Physical Exam Updated Vital Signs BP (!) 147/97 (BP Location: Right Arm)   Pulse 84   Temp 97.8 F (36.6 C) (Oral)   Resp 16   SpO2 98%   Physical Exam Vitals reviewed.  Constitutional:      Appearance: She is well-developed.  HENT:     Head: Normocephalic.     Mouth/Throat:     Mouth: Mucous membranes are moist.  Eyes:     General: No scleral icterus.    Conjunctiva/sclera: Conjunctivae normal.  Neck:     Thyroid: No thyromegaly.  Cardiovascular:     Rate and Rhythm: Normal rate and regular rhythm.     Heart sounds: No murmur heard. No friction rub. No gallop.   Pulmonary:     Breath sounds: No stridor. No wheezing or rales.  Chest:     Chest wall: No tenderness.  Abdominal:     General: There is no distension.     Tenderness: There is abdominal tenderness. There is no rebound.  Musculoskeletal:        General: Normal range of motion.     Cervical back: Neck supple.  Lymphadenopathy:     Cervical: No cervical adenopathy.  Skin:    Findings: No erythema or rash.  Neurological:      Mental Status: She is alert and oriented to person, place, and time.     Motor: No abnormal muscle tone.     Coordination: Coordination normal.  Psychiatric:        Behavior: Behavior normal.     ED Results / Procedures / Treatments   Labs (all labs ordered are listed, but only abnormal results are displayed) Labs Reviewed  URINALYSIS, ROUTINE W REFLEX MICROSCOPIC - Abnormal; Notable for the following components:      Result Value   Hgb urine dipstick LARGE (*)    Protein, ur 30 (*)    Bacteria, UA RARE (*)    All other components within normal limits  CBC - Abnormal;  Notable for the following components:   WBC 12.4 (*)    RBC 5.16 (*)    MCV 79.5 (*)    MCH 24.0 (*)    All other components within normal limits  COMPREHENSIVE METABOLIC PANEL - Abnormal; Notable for the following components:   Albumin 3.3 (*)    AST 13 (*)    All other components within normal limits  HCG, QUANTITATIVE, PREGNANCY  LIPASE, BLOOD    EKG None  Radiology No results found.  Procedures Procedures   Medications Ordered in ED Medications  iohexol (OMNIPAQUE) 300 MG/ML solution 100 mL (has no administration in time range)  HYDROmorphone (DILAUDID) injection 1 mg (1 mg Intravenous Given 10/30/20 1320)  ondansetron (ZOFRAN) injection 4 mg (4 mg Intravenous Given 10/30/20 1319)    ED Course  I have reviewed the triage vital signs and the nursing notes.  Pertinent labs & imaging results that were available during my care of the patient were reviewed by me and considered in my medical decision making (see chart for details).    MDM Rules/Calculators/A&P                           Final Clinical Impression(s) / ED Diagnoses Final diagnoses:  None    Rx / DC Orders ED Discharge Orders    None       Milton Ferguson, MD 10/30/20 1513

## 2020-11-08 ENCOUNTER — Telehealth (INDEPENDENT_AMBULATORY_CARE_PROVIDER_SITE_OTHER): Payer: BLUE CROSS/BLUE SHIELD | Admitting: Family Medicine

## 2020-11-08 ENCOUNTER — Other Ambulatory Visit: Payer: Self-pay

## 2020-11-08 DIAGNOSIS — M1711 Unilateral primary osteoarthritis, right knee: Secondary | ICD-10-CM | POA: Diagnosis not present

## 2020-11-08 MED ORDER — MELOXICAM 7.5 MG PO TABS: 7.5000 mg | ORAL_TABLET | Freq: Two times a day (BID) | ORAL | 1 refills | Status: DC | PRN

## 2020-11-08 NOTE — Progress Notes (Signed)
Virtual Visit via Telephone Note  I connected with Karen Lutz on 11/08/20 at  8:10 AM EDT by telephone and verified that I am speaking with the correct person using two identifiers.  Location: Patient: home Provider: office   I discussed the limitations, risks, security and privacy concerns of performing an evaluation and management service by telephone and the availability of in person appointments. I also discussed with the patient that there may be a patient responsible charge related to this service. The patient expressed understanding and agreed to proceed.   History of Present Illness:  Ms. Karen Lutz is a 50 year old female is following up after the MRI of her right knee.  This was demonstrating edema of the posterior lateral intercondylar eminence with a complex tear of the lateral meniscus and horizontal tear of the medial meniscus.  There were areas of high-grade lateral patellofemoral compartment chondrosis with a moderate joint effusion and synovitis.  There was lateral patellar tracking demonstrated as well.  She continues to have pain and it has affected her job.  She had received a steroid injection the emergency department.  Observations/Objective:  Assessment and Plan:  OA of right knee: Has meniscus changes in the lateral and medial compartment as well as findings suggestive of bone contusion.  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  Has tried a steroid injection from the emergency department. -Counseled on home exercise therapy and supportive care. -Referral to physical therapy. -Pursue gel injection. - mobic   Follow Up Instructions:    I discussed the assessment and treatment plan with the patient. The patient was provided an opportunity to ask questions and all were answered. The patient agreed with the plan and demonstrated an understanding of the instructions.   The patient was advised to call back or seek an in-person  evaluation if the symptoms worsen or if the condition fails to improve as anticipated.  I provided 10 minutes of non-face-to-face time during this encounter.   Clearance Coots, MD

## 2020-11-08 NOTE — Assessment & Plan Note (Signed)
Has meniscus changes in the lateral and medial compartment as well as findings suggestive of bone contusion.  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  Has tried a steroid injection from the emergency department. -Counseled on home exercise therapy and supportive care. -Referral to physical therapy. -Pursue gel injection. - mobic

## 2020-11-09 ENCOUNTER — Other Ambulatory Visit: Payer: Self-pay | Admitting: Family Medicine

## 2020-11-09 DIAGNOSIS — M1711 Unilateral primary osteoarthritis, right knee: Secondary | ICD-10-CM

## 2020-11-11 ENCOUNTER — Telehealth: Payer: Self-pay | Admitting: *Deleted

## 2020-11-11 NOTE — Telephone Encounter (Signed)
Called pt to inform [employer] states she needs to pay one co pay at date of service for right knee Orthovisc injection and then the rest of OOP later.  No answer/unable to leave vm. Will try again later.

## 2020-11-12 ENCOUNTER — Encounter (HOSPITAL_COMMUNITY): Payer: Self-pay | Admitting: Emergency Medicine

## 2020-11-12 ENCOUNTER — Emergency Department (HOSPITAL_COMMUNITY): Payer: BLUE CROSS/BLUE SHIELD

## 2020-11-12 ENCOUNTER — Emergency Department (HOSPITAL_COMMUNITY)
Admission: EM | Admit: 2020-11-12 | Discharge: 2020-11-12 | Disposition: A | Discharge status: Home / Self Care | Payer: BLUE CROSS/BLUE SHIELD | Attending: Emergency Medicine | Admitting: Emergency Medicine

## 2020-11-12 DIAGNOSIS — R52 Pain, unspecified: Secondary | ICD-10-CM

## 2020-11-12 DIAGNOSIS — M25512 Pain in left shoulder: Secondary | ICD-10-CM | POA: Diagnosis not present

## 2020-11-12 DIAGNOSIS — M546 Pain in thoracic spine: Secondary | ICD-10-CM | POA: Insufficient documentation

## 2020-11-12 MED ORDER — KETOROLAC TROMETHAMINE 60 MG/2ML IM SOLN
60.0000 mg | Freq: Once | INTRAMUSCULAR | Status: AC
  Administered 2020-11-12: 60 mg via INTRAMUSCULAR
  Filled 2020-11-12: qty 2

## 2020-11-12 MED ORDER — METHOCARBAMOL 500 MG PO TABS
500.0000 mg | ORAL_TABLET | Freq: Once | ORAL | Status: AC
  Administered 2020-11-12: 500 mg via ORAL
  Filled 2020-11-12: qty 1

## 2020-11-12 MED ORDER — METHOCARBAMOL 500 MG PO TABS: 500.0000 mg | ORAL_TABLET | Freq: Three times a day (TID) | ORAL | 0 refills | Status: DC | PRN

## 2020-11-12 NOTE — ED Provider Notes (Signed)
[facility] DEPT Provider Note   CSN: [id number] Arrival date & time: 11/12/20  1657     History Chief Complaint  Patient presents with  . Shoulder Pain    Karen Lutz is a 50 y.o. female story of migraine, mood disorder, GERD.  Patient presents with complaint of left shoulder and scapula pain.  Patient reports pain started on 11/08/2020.  Pain has progressively worsened since then.  Rates pain 9/10 on the pain scale.  Pain is worse with movement or touch.  Patient denies minimal improvement with over-the-counter pain medications.  Patient denies any recent falls or injuries.  Patient denies any fevers, chills, numbness or tingling to extremities, weakness to extremities, bowel or bladder dysfunction, saddle anesthesia.    HPI     Past Medical History:  Diagnosis Date  . Acid reflux   . Anemia   . Fibroids   . Heart murmur   . UTI (lower urinary tract infection)     Patient Active Problem List   Diagnosis Date Noted  . Visit for wound check 10/03/2020  . OA (osteoarthritis) of knee 05/09/2020  . Epigastric pain 07/03/2013  . Anemia 07/03/2013  . Migraine 07/03/2013  . Mood disorder ([place]) 02/15/2011  . Iron deficiency anemia 02/15/2011  . Uterine fibroid 02/15/2011  . GERD (gastroesophageal reflux disease) 02/15/2011    Past Surgical History:  Procedure Laterality Date  . ceaserian    . CESAREAN SECTION    . CESAREAN SECTION    . KNEE SURGERY    . KNEE SURGERY       OB History   No obstetric history on file.     Family History  Problem Relation Age of Onset  . Hypertension Other   . Cancer Other   . Alcohol abuse Mother   . Heart disease Mother   . Alcohol abuse Father   . Heart disease Father   . Arthritis Maternal Grandmother   . Arthritis Maternal Grandfather   . Arthritis Paternal Grandmother   . Arthritis Paternal Grandfather     Social History   Tobacco Use  . Smoking status: Never Smoker  . Smokeless  tobacco: Never Used  Vaping Use  . Vaping Use: Never used  Substance Use Topics  . Alcohol use: No  . Drug use: No    Home Medications Prior to Admission medications   Medication Sig Start Date End Date Taking? Authorizing Provider  albuterol (VENTOLIN HFA) 108 (90 Base) MCG/ACT inhaler Inhale 2 puffs into the lungs every 4 (four) hours as needed for wheezing or shortness of breath. Patient not taking: Reported on 10/30/2020 09/08/20   Hans Eden, NP  benzonatate (TESSALON) 100 MG capsule Take 1 capsule (100 mg total) by mouth every 8 (eight) hours. Patient not taking: Reported on 10/30/2020 09/08/20   Hans Eden, NP  dextromethorphan-guaiFENesin [facility] DM) 30-600 MG 12hr tablet Take 1 tablet by mouth 2 (two) times daily. Patient not taking: Reported on 10/30/2020 10/06/20   Hazel Sams, PA-C  Diclofenac Sodium (PENNSAID) 2 % SOLN Place 1 application onto the skin 2 (two) times daily. Patient not taking: Reported on 10/30/2020 05/09/20   Rosemarie Ax, MD  HYDROcodone-acetaminophen (NORCO/VICODIN) 5-325 MG tablet Take 1 tablet by mouth every 8 (eight) hours as needed for severe pain. 10/30/20   Varney Biles, MD  ibuprofen (ADVIL) 800 MG tablet TAKE 1 TABLET (800 MG TOTAL) BY MOUTH 3 (THREE) TIMES DAILY. Patient not taking: Reported on 10/30/2020 06/06/20  06/06/21  Sponseller, Gypsy Balsam, PA-C  meloxicam (MOBIC) 7.5 MG tablet Take 1 tablet (7.5 mg total) by mouth 2 (two) times daily as needed for pain. 11/08/20   Rosemarie Ax, MD  norethindrone (AYGESTIN) 5 MG tablet Take 5 mg by mouth 2 (two) times daily. 10/03/20   [provider]  predniSONE (DELTASONE) 10 MG tablet Take 4 tablets (40 mg total) by mouth daily. Patient not taking: Reported on 10/30/2020 09/08/20   Hans Eden, NP  promethazine-dextromethorphan (PROMETHAZINE-DM) 6.25-15 MG/5ML syrup Take 5 mLs by mouth 4 (four) times daily as needed for cough. Patient not taking: Reported on 10/30/2020 10/06/20    Hazel Sams, PA-C    Allergies    Penicillins and Penicillin g  Review of Systems   Review of Systems  Constitutional: Negative for chills and fever.  Musculoskeletal: Positive for back pain, myalgias and neck pain. Negative for arthralgias, gait problem, joint swelling and neck stiffness.  Skin: Negative for color change and wound.  Neurological: Negative for weakness and numbness.    Physical Exam Updated Vital Signs BP (!) 152/92 (BP Location: Left Arm)   Pulse (!) 101   Temp 98.1 F (36.7 C) (Oral)   Resp 18   LMP  (LMP Unknown)   SpO2 100%   Physical Exam Vitals and nursing note reviewed.  Constitutional:      General: She is not in acute distress.    Appearance: She is not ill-appearing, toxic-appearing or diaphoretic.  HENT:     Head: Normocephalic.  Eyes:     General: No scleral icterus.       Right eye: No discharge.        Left eye: No discharge.  Cardiovascular:     Rate and Rhythm: Normal rate.  Pulmonary:     Effort: Pulmonary effort is normal.  Musculoskeletal:     Right shoulder: No swelling, deformity, effusion, laceration, tenderness, bony tenderness or crepitus. Normal range of motion.     Left shoulder: Tenderness present. No swelling, deformity, effusion, laceration, bony tenderness or crepitus. Decreased range of motion.     Left upper arm: Normal.     Left elbow: Normal.     Left forearm: Normal.     Left wrist: Normal.     Left hand: No swelling, deformity, lacerations, tenderness or bony tenderness. Normal range of motion. Normal sensation.     Cervical back: No swelling, edema, deformity, erythema, signs of trauma, lacerations, rigidity, spasms, torticollis, tenderness, bony tenderness or crepitus. No pain with movement. Normal range of motion.     Thoracic back: Tenderness present. No swelling, edema, deformity, signs of trauma, lacerations, spasms or bony tenderness. Normal range of motion.     Lumbar back: No swelling, edema, deformity,  signs of trauma, lacerations, spasms, tenderness or bony tenderness. Normal range of motion.     Comments: Decreased shoulder flexion and abduction due to complaints of pain  Tenderness to left trapezius muscle, tenderness to left thoracic back  No tenderness, step-off, or deformity to cervical, thoracic, or lumbar spine  Skin:    General: Skin is warm and dry.  Neurological:     General: No focal deficit present.     Mental Status: She is alert.  Psychiatric:        Behavior: Behavior is cooperative.     ED Results / Procedures / Treatments   Labs (all labs ordered are listed, but only abnormal results are displayed) Labs Reviewed - No data to display  EKG None  Radiology DG Scapula Left  Result Date: 11/12/2020 CLINICAL DATA:  Left shoulder pain. EXAM: LEFT SCAPULA - 2+ VIEWS COMPARISON:  None. FINDINGS: There is no evidence of fracture or other focal bone lesions. Soft tissues are unremarkable. IMPRESSION: Negative. Electronically Signed   By: Fidela Salisbury M.D.   On: 11/12/2020 18:13   DG Shoulder Left  Result Date: 11/12/2020 CLINICAL DATA:  Left shoulder pain and posterior scapular pain for 4 days. EXAM: LEFT SHOULDER - 2+ VIEW COMPARISON:  None. FINDINGS: There is no evidence of fracture or dislocation. There is no evidence of arthropathy or other focal bone abnormality. Soft tissues are unremarkable. IMPRESSION: Negative. Electronically Signed   By: Fidela Salisbury M.D.   On: 11/12/2020 18:13    Procedures Procedures   Medications Ordered in ED Medications  ketorolac (TORADOL) injection 60 mg (60 mg Intramuscular Given 11/12/20 1828)  methocarbamol (ROBAXIN) tablet 500 mg (500 mg Oral Given 11/12/20 1828)    ED Course  I have reviewed the triage vital signs and the nursing notes.  Pertinent labs & imaging results that were available during my care of the patient were reviewed by me and considered in my medical decision making (see chart for details).     MDM Rules/Calculators/A&P                          Alert 50 year old female no acute distress, nontoxic appearing.  Patient presents with chief complaint of right shoulder and thoracic back pain.  Patient denies any traumatic injury, fall, or injury.  Decreased shoulder flexion and abduction due to complaints of pain.  Tenderness to left trapezius muscle, tenderness to left thoracic back.   No tenderness, step-off, or deformity to cervical, thoracic, or lumbar spine.    We will obtain x-ray of left shoulder and left scapula.  X-ray imaging is unremarkable.  Patient's symptoms likely musculoskeletal in nature.  Will give patient Toradol injection and prescription for Robaxin.  Patient advised to follow-up with her primary care provider if her symptoms do not improve.  Discussed results, findings, treatment and follow up. Patient advised of return precautions. Patient verbalized understanding and agreed with plan.     Final Clinical Impression(s) / ED Diagnoses Final diagnoses:  Acute pain of left shoulder    Rx / DC Orders ED Discharge Orders         Ordered    methocarbamol (ROBAXIN) 500 MG tablet  Every 8 hours PRN        11/12/20 1837           Loni Beckwith, PA-C 11/13/20 0027    Lacretia Leigh, MD 11/17/20 [phone]

## 2020-11-12 NOTE — ED Triage Notes (Signed)
Patient c/o left shoulder pain x4 days without injury. States pain worsens with movement.

## 2020-11-12 NOTE — Discharge Instructions (Addendum)
You came to the emergency department today to be evaluated for your left shoulder pain.  Your physical exam was reassuring.  Your x-ray showed no broken bones or dislocations.  Your pain is likely musculoskeletal in nature and will improve over time.  I have given you prescription for Robaxin as a muscle relaxer.  If your symptoms do not improve in the next 7 to 10 days please follow-up with your care provider for reevaluation.  Today you were prescribed Methocarbamol (Robaxin).  Methocarbamol (Robaxin) is used to treat muscle spasms/pain.  It works by helping to relax the muscles.  Drowsiness, dizziness, lightheadedness, stomach upset, nausea/vomiting, or blurred vision may occur.  Do not drive, use machinery, or do anything that needs alertness or clear vision until you can do it safely.  Do not combine this medication with alcoholic beverages, marijuana, or other central nervous system depressants.     Please take Ibuprofen (Advil, motrin) and Tylenol (acetaminophen) to relieve your pain.    You may take up to 600 MG (3 pills) of normal strength ibuprofen every 8 hours as needed.   You make take tylenol, up to 1,000 mg (two extra strength pills) every 8 hours as needed.   It is safe to take ibuprofen and tylenol at the same time as they work differently.   Do not take more than 3,000 mg tylenol in a 24 hour period (not more than one dose every 8 hours.  Please check all medication labels as many medications such as pain and cold medications may contain tylenol.  Do not drink alcohol while taking these medications.  Do not take other NSAID'S while taking ibuprofen (such as aleve or naproxen).  Please take ibuprofen with food to decrease stomach upset.    Get help right away if: Your arm, hand, or fingers: Tingle. Become numb. Become swollen. Become painful. Turn white or blue.

## 2020-11-23 ENCOUNTER — Other Ambulatory Visit: Payer: Self-pay

## 2020-11-23 ENCOUNTER — Encounter (HOSPITAL_COMMUNITY): Payer: Self-pay

## 2020-11-23 ENCOUNTER — Emergency Department (HOSPITAL_COMMUNITY)
Admission: EM | Admit: 2020-11-23 | Discharge: 2020-11-24 | Disposition: A | Discharge status: Home / Self Care | Payer: BLUE CROSS/BLUE SHIELD | Attending: Emergency Medicine | Admitting: Emergency Medicine

## 2020-11-23 DIAGNOSIS — S46812D Strain of other muscles, fascia and tendons at shoulder and upper arm level, left arm, subsequent encounter: Secondary | ICD-10-CM | POA: Diagnosis not present

## 2020-11-23 DIAGNOSIS — X58XXXD Exposure to other specified factors, subsequent encounter: Secondary | ICD-10-CM | POA: Diagnosis not present

## 2020-11-23 DIAGNOSIS — S59912D Unspecified injury of left forearm, subsequent encounter: Secondary | ICD-10-CM | POA: Diagnosis present

## 2020-11-23 NOTE — ED Triage Notes (Signed)
Left arm pain radiating from shoulder all the way down with numbness and tingling x 2 days. Went to [facility] was prescribed with muscle relaxer with little relief.   More of pins and needles sensation. Denies any shortness of breath and chest pain.

## 2020-11-23 NOTE — ED Triage Notes (Signed)
Emergency Medicine Provider Triage Evaluation Note  Karen Lutz , a 50 y.o. female  was evaluated in triage.  Pt complains of pain extending from her left upper back behind her shoulder blade into her neck and down her left arm with some associated tingling.  Pain is worse with movement and she has to hold her arm across her body to help with pain.  She was seen for this recently at [facility] and was treated with some muscle relaxers with improvement but over the past few days pain has returned and worsened.  Review of Systems  Positive: Arm pain, tingling Negative: Chest pain, shortness of breath, numbness, weakness  Physical Exam  LMP  (LMP Unknown)  Gen:   Awake, no distress   HEENT:  Atraumatic  Resp:  Normal effort  Cardiac:  Normal rate  Abd:   Nondistended MSK:   Moves extremities without difficulty, tenderness and palpable spasm in the left trapezius muscle Neuro:  Speech clear   Medical Decision Making  Medically screening exam initiated at 7:51 PM.  Appropriate orders placed.  Sharen Heck Bero was informed that the remainder of the evaluation will be completed by another provider, this initial triage assessment does not replace that evaluation, and the importance of remaining in the ED until their evaluation is complete.  Clinical Impression  1.  Arm pain 2.  Muscle spasm   Jacqlyn Larsen, [place] 11/23/20 2010

## 2020-11-24 ENCOUNTER — Encounter (HOSPITAL_COMMUNITY): Payer: Self-pay | Admitting: Emergency Medicine

## 2020-11-24 ENCOUNTER — Ambulatory Visit (HOSPITAL_COMMUNITY)
Admission: EM | Admit: 2020-11-24 | Discharge: 2020-11-24 | Disposition: A | Discharge status: Home / Self Care | Payer: BLUE CROSS/BLUE SHIELD | Attending: Internal Medicine | Admitting: Internal Medicine

## 2020-11-24 ENCOUNTER — Other Ambulatory Visit: Payer: Self-pay

## 2020-11-24 DIAGNOSIS — M5432 Sciatica, left side: Secondary | ICD-10-CM

## 2020-11-24 MED ORDER — KETOROLAC TROMETHAMINE 30 MG/ML IJ SOLN
30.0000 mg | Freq: Once | INTRAMUSCULAR | Status: AC
  Administered 2020-11-24: 30 mg via INTRAMUSCULAR
  Filled 2020-11-24: qty 1

## 2020-11-24 MED ORDER — LIDOCAINE 5 % EX PTCH: 1.0000 | MEDICATED_PATCH | CUTANEOUS | 0 refills | Status: DC

## 2020-11-24 MED ORDER — HYDROCODONE-ACETAMINOPHEN 5-325 MG PO TABS: 1.0000 | ORAL_TABLET | Freq: Four times a day (QID) | ORAL | 0 refills | Status: DC | PRN

## 2020-11-24 MED ORDER — NAPROXEN 500 MG PO TABS
500.0000 mg | ORAL_TABLET | Freq: Two times a day (BID) | ORAL | 0 refills | Status: DC
Start: 2020-11-24 — End: 2020-11-24

## 2020-11-24 MED ORDER — PREDNISONE 20 MG PO TABS: 40.0000 mg | ORAL_TABLET | Freq: Every day | ORAL | 0 refills | Status: AC

## 2020-11-24 NOTE — Discharge Instructions (Signed)
Stop taking NSAIDs Take short course of steroids Follow-up with [facility] for further evaluation Use muscle relaxants at bedtime Heating pad use on a 10 minutes on-10 minutes of cycle; 4-6 cycles twice a day. If your symptoms worsen please see [facility] urgent care [facility].

## 2020-11-24 NOTE — ED Notes (Signed)
Pt complaining of back and shoulder pain. No obvious deformities observed. Tender to touch

## 2020-11-24 NOTE — ED Provider Notes (Signed)
[facility] Emergency Department Provider Note MRN:  [id number]  Arrival date & time: 11/24/20     Chief Complaint   Arm Pain and Tingling   History of Present Illness   Karen Lutz is a 50 y.o. year-old female with a history of acid reflux, obesity presenting to the ED with chief complaint of arm pain.  Pain located in the left trapezius, initially started hurting about 3 weeks ago, was seen in the emergency department and diagnosed with a muscle spasm.  Was getting better on muscle relaxers but over the past few days the pain has returned.  Has had occasional paresthesia to the left arm but no numbness or weakness to the arms or legs, no bowel or bladder dysfunction, no trauma to the neck or arm.  Symptoms are constant, worse with certain movements, mild to moderate.  Review of Systems  A complete 10 system review of systems was obtained and all systems are negative except as noted in the HPI and PMH.   Patient's Health History    Past Medical History:  Diagnosis Date  . Acid reflux   . Anemia   . Fibroids   . Heart murmur   . UTI (lower urinary tract infection)     Past Surgical History:  Procedure Laterality Date  . ceaserian    . CESAREAN SECTION    . CESAREAN SECTION    . KNEE SURGERY    . KNEE SURGERY      Family History  Problem Relation Age of Onset  . Hypertension Other   . Cancer Other   . Alcohol abuse Mother   . Heart disease Mother   . Alcohol abuse Father   . Heart disease Father   . Arthritis Maternal Grandmother   . Arthritis Maternal Grandfather   . Arthritis Paternal Grandmother   . Arthritis Paternal Grandfather     Social History   Socioeconomic History  . Marital status: Married    Spouse name: Not on file  . Number of children: Not on file  . Years of education: Not on file  . Highest education level: Not on file  Occupational History  . Not on file  Tobacco Use  . Smoking status: Never Smoker  .  Smokeless tobacco: Never Used  Vaping Use  . Vaping Use: Never used  Substance and Sexual Activity  . Alcohol use: No  . Drug use: No  . Sexual activity: Not on file  Other Topics Concern  . Not on file  Social History Narrative  . Not on file   Social Determinants of Health   Financial Resource Strain: Not on file  Food Insecurity: Not on file  Transportation Needs: Not on file  Physical Activity: Not on file  Stress: Not on file  Social Connections: Not on file  Intimate Partner Violence: Not on file     Physical Exam   Vitals:   11/23/20 2008 11/23/20 2345  BP: (!) 153/101 (!) 136/102  Pulse: (!) 111 (!) 108  Resp: 16 20  Temp: 98.6 F (37 C)   SpO2: 100% 99%    CONSTITUTIONAL: Well-appearing, NAD NEURO:  Alert and oriented x 3, normal and symmetric strength and sensation, normal coordination, normal speech EYES:  eyes equal and reactive ENT/NECK:  no LAD, no JVD CARDIO: Regular rate, well-perfused, normal S1 and S2 PULM:  CTAB no wheezing or rhonchi GI/GU:  normal bowel sounds, non-distended, non-tender MSK/SPINE:  No gross deformities, no edema; tenderness palpation  of the left trapezius, Spurling negative SKIN:  no rash, atraumatic PSYCH:  Appropriate speech and behavior  *Additional and/or pertinent findings included in MDM below  Diagnostic and Interventional Summary    EKG Interpretation  Date/Time:    Ventricular Rate:    PR Interval:    QRS Duration:   QT Interval:    QTC Calculation:   R Axis:     Text Interpretation:        Labs Reviewed - No data to display  No orders to display    Medications  ketorolac (TORADOL) 30 MG/ML injection 30 mg (has no administration in time range)     Procedures  /  Critical Care Procedures  ED Course and Medical Decision Making  I have reviewed the triage vital signs, the nursing notes, and pertinent available records from the EMR.  Listed above are laboratory and imaging tests that I personally  ordered, reviewed, and interpreted and then considered in my medical decision making (see below for details).  Consistent with trapezius strain, no neurodeficits, Spurling negative, doubt cervical radiculopathy.  No symptoms to suggest myelopathy.  Appropriate for discharge.       Barth Kirks. Sedonia Small, MD [place] [email or web address]  Final Clinical Impressions(s) / ED Diagnoses     ICD-10-CM   1. Trapezius strain, left, subsequent encounter  S46.812D     ED Discharge Orders         Ordered    naproxen (NAPROSYN) 500 MG tablet  2 times daily        11/24/20 0011    lidocaine (LIDODERM) 5 %  Every 24 hours        11/24/20 0011           Discharge Instructions Discussed with and Provided to Patient:     Discharge Instructions     You were evaluated in the Emergency Department and after careful evaluation, we did not find any emergent condition requiring admission or further testing in the hospital.  Your exam/testing today was overall reassuring.  Symptoms seem to be due to muscle strain or spasm.  Please do some light stretching of the muscles during the day.  We also recommend warm compresses.  Use the numbing patches and Naprosyn as directed.  Please return to the Emergency Department if you experience any worsening of your condition.  Thank you for allowing [place] to be a part of your care.       Maudie Flakes, MD 11/24/20 [phone]

## 2020-11-24 NOTE — ED Triage Notes (Signed)
Pt presents with left chest and shoulder pain xs 2 weeks. States went to ED last night but states unable to take naproxen and states lidocaine patch not helping.

## 2020-11-24 NOTE — ED Notes (Signed)
Patient verbalized understanding of discharge instructions. Opportunity for questions and answers.  

## 2020-11-24 NOTE — Discharge Instructions (Addendum)
You were evaluated in the Emergency Department and after careful evaluation, we did not find any emergent condition requiring admission or further testing in the hospital.  Your exam/testing today was overall reassuring.  Symptoms seem to be due to muscle strain or spasm.  Please do some light stretching of the muscles during the day.  We also recommend warm compresses.  Use the numbing patches and Naprosyn as directed.  Please return to the Emergency Department if you experience any worsening of your condition.  Thank you for allowing [place] to be a part of your care.

## 2020-11-28 NOTE — ED Provider Notes (Signed)
[place]    CSN: [id number] Arrival date & time: 11/24/20  1502      History   Chief Complaint Chief Complaint  Patient presents with  . Leg Pain  . Back Pain  . Chest Pain    HPI Karen Lutz is a 50 y.o. female comes to the urgent care with complaints of mid back and lower back pain of 2 weeks duration.  Pain acutely worsened over the past couple of days resulting in the patient going to the emergency department.  Patient describes the pain as severe, 10/10, sharp with no known relieving factors.  Patient was given a shot of Toradol with no improvement in symptoms.  She was prescribed naproxen and a lidocaine patch and these not helping her symptoms.  Pain is aggravated by sitting down.  It radiates to the left leg.  She denies any numbness or tingling or weakness in the left leg.  Patient also describes similar pain on the left side of the chest and shoulder radiating down into the left arm.  No neck pain.  Pain is sharp, throbbing and aggravated by movement of the left upper extremity.  Patient denies any trauma or falls.  She has had similar episodes in the past..    HPI  Past Medical History:  Diagnosis Date  . Acid reflux   . Anemia   . Fibroids   . Heart murmur   . UTI (lower urinary tract infection)     Patient Active Problem List   Diagnosis Date Noted  . Visit for wound check 10/03/2020  . OA (osteoarthritis) of knee 05/09/2020  . Epigastric pain 07/03/2013  . Anemia 07/03/2013  . Migraine 07/03/2013  . Mood disorder ([place]) 02/15/2011  . Iron deficiency anemia 02/15/2011  . Uterine fibroid 02/15/2011  . GERD (gastroesophageal reflux disease) 02/15/2011    Past Surgical History:  Procedure Laterality Date  . ceaserian    . CESAREAN SECTION    . CESAREAN SECTION    . KNEE SURGERY    . KNEE SURGERY      OB History   No obstetric history on file.      Home Medications    Prior to Admission medications   Medication Sig Start Date  End Date Taking? Authorizing Provider  HYDROcodone-acetaminophen (NORCO/VICODIN) 5-325 MG tablet Take 1 tablet by mouth every 6 (six) hours as needed for moderate pain or severe pain. 11/24/20  Yes Gopal Malter, Myrene Galas, MD  predniSONE (DELTASONE) 20 MG tablet Take 2 tablets (40 mg total) by mouth daily for 5 days. 11/24/20 11/29/20 Yes Tyah Acord, Myrene Galas, MD  lidocaine (LIDODERM) 5 % Place 1 patch onto the skin daily. Remove & Discard patch within 12 hours or as directed by MD 11/24/20   Maudie Flakes, MD  methocarbamol (ROBAXIN) 500 MG tablet Take 1 tablet (500 mg total) by mouth every 8 (eight) hours as needed for muscle spasms. 11/12/20   Loni Beckwith, PA-C  norethindrone (AYGESTIN) 5 MG tablet Take 5 mg by mouth 2 (two) times daily. 10/03/20   [provider]  albuterol (VENTOLIN HFA) 108 (90 Base) MCG/ACT inhaler Inhale 2 puffs into the lungs every 4 (four) hours as needed for wheezing or shortness of breath. Patient not taking: Reported on 10/30/2020 09/08/20 11/24/20  Hans Eden, NP    Family History Family History  Problem Relation Age of Onset  . Hypertension Other   . Cancer Other   . Alcohol abuse Mother   .  Heart disease Mother   . Alcohol abuse Father   . Heart disease Father   . Arthritis Maternal Grandmother   . Arthritis Maternal Grandfather   . Arthritis Paternal Grandmother   . Arthritis Paternal Grandfather     Social History Social History   Tobacco Use  . Smoking status: Never Smoker  . Smokeless tobacco: Never Used  Vaping Use  . Vaping Use: Never used  Substance Use Topics  . Alcohol use: No  . Drug use: No     Allergies   Penicillins and Penicillin g   Review of Systems Review of Systems  Respiratory: Negative.   Musculoskeletal: Positive for back pain. Negative for arthralgias, myalgias, neck pain and neck stiffness.  Skin: Negative.   Neurological: Negative.      Physical Exam Triage Vital Signs ED Triage Vitals  Enc Vitals  Group     BP 11/24/20 1534 92/60     Pulse Rate 11/24/20 1534 (!) 105     Resp 11/24/20 1534 (!) 22     Temp 11/24/20 1534 98.9 F (37.2 C)     Temp Source 11/24/20 1534 Oral     SpO2 11/24/20 1534 98 %     Weight --      Height --      Head Circumference --      Peak Flow --      Pain Score 11/24/20 1533 10     Pain Loc --      Pain Edu? --      Excl. in [place]? --    No data found.  Updated Vital Signs BP 92/60 (BP Location: Right Arm)   Pulse (!) 105   Temp 98.9 F (37.2 C) (Oral)   Resp (!) 22   LMP  (LMP Unknown)   SpO2 98%   Visual Acuity Right Eye Distance:   Left Eye Distance:   Bilateral Distance:    Right Eye Near:   Left Eye Near:    Bilateral Near:     Physical Exam Vitals and nursing note reviewed.  Constitutional:      General: She is in acute distress.     Appearance: She is ill-appearing.  Pulmonary:     Breath sounds: Normal breath sounds.  Abdominal:     General: Bowel sounds are normal.  Musculoskeletal:     Comments: Straight leg test is positive from the left lower extremity.  No tenderness at the sacroiliac joint.  Paraspinal muscle tenderness.  Deep tendon reflexes 1+ in both lower extremities.  No deficits in sensation.  Power is 5/5 in both lower extremities.   Neurological:     Mental Status: She is alert.      UC Treatments / Results  Labs (all labs ordered are listed, but only abnormal results are displayed) Labs Reviewed - No data to display  EKG   Radiology No results found.  Procedures Procedures (including critical care time)  Medications Ordered in UC Medications - No data to display  Initial Impression / Assessment and Plan / UC Course  I have reviewed the triage vital signs and the nursing notes.  Pertinent labs & imaging results that were available during my care of the patient were reviewed by me and considered in my medical decision making (see chart for details).     1.  Sciatica, left side: Failed  NSAIDs treatment Short course of steroids Heating pad use Hydrocodone-acetaminophen as needed for pain If symptoms does not improve patient may need  to see orthopedic surgery for further evaluation Return precautions given. Final Clinical Impressions(s) / UC Diagnoses   Final diagnoses:  Sciatica, left side     Discharge Instructions     Stop taking NSAIDs Take short course of steroids Follow-up with [facility] for further evaluation Use muscle relaxants at bedtime Heating pad use on a 10 minutes on-10 minutes of cycle; 4-6 cycles twice a day. If your symptoms worsen please see [facility] urgent care [facility].   ED Prescriptions    Medication Sig Dispense Auth. Provider   predniSONE (DELTASONE) 20 MG tablet Take 2 tablets (40 mg total) by mouth daily for 5 days. 10 tablet Jannat Rosemeyer, Myrene Galas, MD   HYDROcodone-acetaminophen (NORCO/VICODIN) 5-325 MG tablet Take 1 tablet by mouth every 6 (six) hours as needed for moderate pain or severe pain. 10 tablet Azura Tufaro, Myrene Galas, MD     I have reviewed the PDMP during this encounter.   Chase Picket, MD 11/28/20 [phone]

## 2020-12-18 ENCOUNTER — Other Ambulatory Visit: Payer: Self-pay

## 2020-12-18 ENCOUNTER — Emergency Department (HOSPITAL_COMMUNITY)
Admission: EM | Admit: 2020-12-18 | Discharge: 2020-12-18 | Disposition: A | Discharge status: Home / Self Care | Payer: BLUE CROSS/BLUE SHIELD | Attending: Emergency Medicine | Admitting: Emergency Medicine

## 2020-12-18 ENCOUNTER — Encounter (HOSPITAL_COMMUNITY): Payer: Self-pay | Admitting: Emergency Medicine

## 2020-12-18 DIAGNOSIS — T8189XA Other complications of procedures, not elsewhere classified, initial encounter: Secondary | ICD-10-CM | POA: Insufficient documentation

## 2020-12-18 DIAGNOSIS — G8918 Other acute postprocedural pain: Secondary | ICD-10-CM | POA: Diagnosis not present

## 2020-12-18 DIAGNOSIS — R1030 Lower abdominal pain, unspecified: Secondary | ICD-10-CM | POA: Diagnosis not present

## 2020-12-18 DIAGNOSIS — Z5189 Encounter for other specified aftercare: Secondary | ICD-10-CM

## 2020-12-18 DIAGNOSIS — Z8719 Personal history of other diseases of the digestive system: Secondary | ICD-10-CM | POA: Insufficient documentation

## 2020-12-18 LAB — URINALYSIS, ROUTINE W REFLEX MICROSCOPIC
Bacteria, UA: NONE SEEN
Bilirubin Urine: NEGATIVE
Glucose, UA: NEGATIVE mg/dL
Hgb urine dipstick: NEGATIVE
Ketones, ur: NEGATIVE mg/dL
Nitrite: NEGATIVE
Protein, ur: NEGATIVE mg/dL
Specific Gravity, Urine: 1.023 (ref 1.005–1.030)
pH: 5 (ref 5.0–8.0)

## 2020-12-18 LAB — CBC WITH DIFFERENTIAL/PLATELET
Abs Immature Granulocytes: 0.02 10*3/uL (ref 0.00–0.07)
Basophils Absolute: 0 10*3/uL (ref 0.0–0.1)
Basophils Relative: 1 %
Eosinophils Absolute: 0.1 10*3/uL (ref 0.0–0.5)
Eosinophils Relative: 3 %
HCT: 33.1 % — ABNORMAL LOW (ref 36.0–46.0)
Hemoglobin: 9.7 g/dL — ABNORMAL LOW (ref 12.0–15.0)
Immature Granulocytes: 1 %
Lymphocytes Relative: 43 %
Lymphs Abs: 2 10*3/uL (ref 0.7–4.0)
MCH: 23 pg — ABNORMAL LOW (ref 26.0–34.0)
MCHC: 29.3 g/dL — ABNORMAL LOW (ref 30.0–36.0)
MCV: 78.6 fL — ABNORMAL LOW (ref 80.0–100.0)
Monocytes Absolute: 0.5 10*3/uL (ref 0.1–1.0)
Monocytes Relative: 11 %
Neutro Abs: 1.8 10*3/uL (ref 1.7–7.7)
Neutrophils Relative %: 41 %
Platelets: 425 10*3/uL — ABNORMAL HIGH (ref 150–400)
RBC: 4.21 MIL/uL (ref 3.87–5.11)
RDW: 15.7 % — ABNORMAL HIGH (ref 11.5–15.5)
WBC: 4.3 10*3/uL (ref 4.0–10.5)
nRBC: 0 % (ref 0.0–0.2)

## 2020-12-18 LAB — COMPREHENSIVE METABOLIC PANEL
ALT: 9 U/L (ref 0–44)
AST: 12 U/L — ABNORMAL LOW (ref 15–41)
Albumin: 3 g/dL — ABNORMAL LOW (ref 3.5–5.0)
Alkaline Phosphatase: 67 U/L (ref 38–126)
Anion gap: 8 (ref 5–15)
BUN: 10 mg/dL (ref 6–20)
CO2: 27 mmol/L (ref 22–32)
Calcium: 9.6 mg/dL (ref 8.9–10.3)
Chloride: 104 mmol/L (ref 98–111)
Creatinine, Ser: 0.61 mg/dL (ref 0.44–1.00)
GFR, Estimated: >60 mL/min (ref >60)
Glucose, Bld: 86 mg/dL (ref 70–99)
Potassium: 4.3 mmol/L (ref 3.5–5.1)
Sodium: 139 mmol/L (ref 135–145)
Total Bilirubin: 0.2 mg/dL — ABNORMAL LOW (ref 0.3–1.2)
Total Protein: 6.8 g/dL (ref 6.5–8.1)

## 2020-12-18 LAB — WET PREP, GENITAL
Clue Cells Wet Prep HPF POC: NONE SEEN
Sperm: NONE SEEN
Trich, Wet Prep: NONE SEEN
Yeast Wet Prep HPF POC: NONE SEEN

## 2020-12-18 LAB — LIPASE, BLOOD: Lipase: 33 U/L (ref 11–51)

## 2020-12-18 MED ORDER — HYDROCODONE-ACETAMINOPHEN 5-325 MG PO TABS
1.0000 | ORAL_TABLET | Freq: Once | ORAL | Status: AC
  Administered 2020-12-18: 1 via ORAL
  Filled 2020-12-18: qty 1

## 2020-12-18 MED ORDER — ACETAMINOPHEN 325 MG PO TABS
650.0000 mg | ORAL_TABLET | Freq: Once | ORAL | Status: AC
  Administered 2020-12-18: 650 mg via ORAL
  Filled 2020-12-18: qty 2

## 2020-12-18 NOTE — ED Provider Notes (Signed)
[place] DEPT Provider Note   CSN: [id number] Arrival date & time: 12/18/20  1210     History Chief Complaint  Patient presents with  . Post-op Problem    Karen Lutz is a 50 y.o. female.  HPI Patient is a 50 year old female with past medical history detailed in HPI presented today with ongoing abdominal discomfort and discomfort at the incision site.  She states that her total abdominal hysterectomy with bilateral salpingotomy was conducted 4/27 by Dr. Grandville Silos. She states that she was diagnosed with a UTI and started antibiotics that she believes was Keflex.  She took her last antibiotics this past Wednesday and states that by Friday she felt like she was having some increased discharge from her incision site.  She also has noticed some bloody and foul-smelling vaginal discharge  Denies any nausea vomiting or diarrhea no lightheadedness or dizziness no chest pain or shortness of breath.  She states her abdominal pain is focally where the incision was made for her surgery.  She denies any aggravating mitigating factors.     Past Medical History:  Diagnosis Date  . Acid reflux   . Anemia   . Fibroids   . Heart murmur   . UTI (lower urinary tract infection)     Patient Active Problem List   Diagnosis Date Noted  . Visit for wound check 10/03/2020  . OA (osteoarthritis) of knee 05/09/2020  . Epigastric pain 07/03/2013  . Anemia 07/03/2013  . Migraine 07/03/2013  . Mood disorder ([place]) 02/15/2011  . Iron deficiency anemia 02/15/2011  . Uterine fibroid 02/15/2011  . GERD (gastroesophageal reflux disease) 02/15/2011    Past Surgical History:  Procedure Laterality Date  . ceaserian    . CESAREAN SECTION    . CESAREAN SECTION    . KNEE SURGERY    . KNEE SURGERY       OB History   No obstetric history on file.     Family History  Problem Relation Age of Onset  . Hypertension Other   . Cancer Other   . Alcohol abuse Mother    . Heart disease Mother   . Alcohol abuse Father   . Heart disease Father   . Arthritis Maternal Grandmother   . Arthritis Maternal Grandfather   . Arthritis Paternal Grandmother   . Arthritis Paternal Grandfather     Social History   Tobacco Use  . Smoking status: Never Smoker  . Smokeless tobacco: Never Used  Vaping Use  . Vaping Use: Never used  Substance Use Topics  . Alcohol use: No  . Drug use: No    Home Medications Prior to Admission medications   Medication Sig Start Date End Date Taking? Authorizing Provider  HYDROcodone-acetaminophen (NORCO/VICODIN) 5-325 MG tablet Take 1 tablet by mouth every 6 (six) hours as needed for moderate pain or severe pain. 11/24/20   Lamptey, Myrene Galas, MD  lidocaine (LIDODERM) 5 % Place 1 patch onto the skin daily. Remove & Discard patch within 12 hours or as directed by MD 11/24/20   Maudie Flakes, MD  methocarbamol (ROBAXIN) 500 MG tablet Take 1 tablet (500 mg total) by mouth every 8 (eight) hours as needed for muscle spasms. 11/12/20   Loni Beckwith, PA-C  norethindrone (AYGESTIN) 5 MG tablet Take 5 mg by mouth 2 (two) times daily. 10/03/20   [provider]  albuterol (VENTOLIN HFA) 108 (90 Base) MCG/ACT inhaler Inhale 2 puffs into the lungs every 4 (four) hours  as needed for wheezing or shortness of breath. Patient not taking: Reported on 10/30/2020 09/08/20 11/24/20  Hans Eden, NP    Allergies    Penicillins and Penicillin g  Review of Systems   Review of Systems  Constitutional: Negative for chills, fatigue and fever.  HENT: Negative for congestion.   Eyes: Negative for pain.  Respiratory: Negative for cough and shortness of breath.   Cardiovascular: Negative for chest pain and leg swelling.  Gastrointestinal: Positive for abdominal pain. Negative for diarrhea, nausea and vomiting.  Genitourinary: Positive for vaginal discharge. Negative for dysuria.  Musculoskeletal: Negative for myalgias.  Skin: Positive  for wound (Surgical wound with some fluid expressed). Negative for rash.  Neurological: Negative for dizziness and headaches.    Physical Exam Updated Vital Signs BP 125/81 (BP Location: Left Arm)   Pulse 87   Temp 97.6 F (36.4 C)   Resp 20   LMP  (LMP Unknown)   SpO2 100%   Physical Exam Vitals and nursing note reviewed.  Constitutional:      General: She is not in acute distress.    Appearance: She is obese.     Comments: Pleasant well-appearing 50 year old.  In no acute distress.  Sitting comfortably in bed.  Able answer questions appropriately follow commands. No increased work of breathing. Speaking in full sentences.  HENT:     Head: Normocephalic and atraumatic.     Nose: Nose normal.  Eyes:     General: No scleral icterus. Cardiovascular:     Rate and Rhythm: Normal rate and regular rhythm.     Pulses: Normal pulses.     Heart sounds: Normal heart sounds.  Pulmonary:     Effort: Pulmonary effort is normal. No respiratory distress.     Breath sounds: No wheezing.  Abdominal:     Palpations: Abdomen is soft.     Tenderness: There is no abdominal tenderness. There is no guarding or rebound.     Comments: Suprapubic surgical scar.  There is a small approximately 5 mm in diameter opening where it appears that it was probed and explored.  There is no significant discharge from this area.  Even with gentle probing it appears only have a small amount of some clearish pinkish fluid.  Pink granulation tissue surrounding wound  Genitourinary:    Comments: Vulva without lesions or abnormality Vaginal canal without any abnormal lesions.  There is some watery discharge present Cervix appears normal, is closed No adnexal tenderness or CMT  Musculoskeletal:     Cervical back: Normal range of motion.     Right lower leg: No edema.     Left lower leg: No edema.  Skin:    General: Skin is warm and dry.     Capillary Refill: Capillary refill takes less than 2 seconds.   Neurological:     Mental Status: She is alert. Mental status is at baseline.  Psychiatric:        Mood and Affect: Mood normal.        Behavior: Behavior normal.     ED Results / Procedures / Treatments   Labs (all labs ordered are listed, but only abnormal results are displayed) Labs Reviewed  WET PREP, GENITAL - Abnormal; Notable for the following components:      Result Value   WBC, Wet Prep HPF POC MODERATE (*)    All other components within normal limits  CBC WITH DIFFERENTIAL/PLATELET - Abnormal; Notable for the following components:   Hemoglobin 9.7 (*)  HCT 33.1 (*)    MCV 78.6 (*)    MCH 23.0 (*)    MCHC 29.3 (*)    RDW 15.7 (*)    Platelets 425 (*)    All other components within normal limits  COMPREHENSIVE METABOLIC PANEL - Abnormal; Notable for the following components:   Albumin 3.0 (*)    AST 12 (*)    Total Bilirubin 0.2 (*)    All other components within normal limits  URINALYSIS, ROUTINE W REFLEX MICROSCOPIC - Abnormal; Notable for the following components:   Leukocytes,Ua MODERATE (*)    All other components within normal limits  URINE CULTURE  LIPASE, BLOOD  GC/CHLAMYDIA PROBE AMP ([place]) NOT AT [facility]    EKG None  Radiology No results found.  Procedures Procedures   Medications Ordered in ED Medications  HYDROcodone-acetaminophen (NORCO/VICODIN) 5-325 MG per tablet 1 tablet (1 tablet Oral Given 12/18/20 1351)  acetaminophen (TYLENOL) tablet 650 mg (650 mg Oral Given 12/18/20 1352)    ED Course  I have reviewed the triage vital signs and the nursing notes.  Pertinent labs & imaging results that were available during my care of the patient were reviewed by me and considered in my medical decision making (see chart for details).  Patient is a 50 year old female presented today with concern for surgical wound.  Clinical Course as of 12/18/20 1503  Sun Dec 18, 2020  1257 Patient is a 50 year old female with past medical history  detailed in HPI presented today with ongoing abdominal discomfort and discomfort at the incision site.  She states that her total abdominal hysterectomy with bilateral salpingotomy was conducted 4/27 by Dr. Grandville Silos. She states that she was diagnosed with a UTI and started antibiotics that she believes was Keflex.  She took her last antibiotics this past Wednesday and states that by Friday she felt like she was having some increased discharge from her incision site.  She also has noticed some bloody and foul-smelling vaginal discharge [WF]    Clinical Course User Index [WF] Tedd Sias, [place]   Patient's work-up ultimately very benign.  CMP unremarkable.  CBC without leukocytosis chronic stable anemia actually improved from patient's baseline anemia.  Urinalysis unremarkable and there are no bacteria and only moderate leukocytes.  Wet prep unremarkable.  Lipase within normal limits on pancreatitis.  MDM Rules/Calculators/A&P                          Patient reassessed.  Abdomen remains soft nontender.  No guarding or rebound.  She feels improved after Norco.  She has follow-up appointment with her surgeon tomorrow.  Will discharge at this time.  Medication for antibiotics.  Final Clinical Impression(s) / ED Diagnoses Final diagnoses:  Post-operative pain  Visit for wound check  Lower abdominal pain    Rx / DC Orders ED Discharge Orders    None       Tedd Sias, [place] 12/18/20 [place], MD 12/23/20 [phone]

## 2020-12-18 NOTE — Discharge Instructions (Signed)
Please keep your appointment tomorrow.  Please discuss your symptoms of your OB/GYN.  Do not appear to have any signs of acute infection today.  Drink plenty of water  Please use Tylenol or ibuprofen for pain.  You may use 600 mg ibuprofen every 6 hours or 1000 mg of Tylenol every 6 hours.  You may choose to alternate between the 2.  This would be most effective.  Not to exceed 4 g of Tylenol within 24 hours.  Not to exceed 3200 mg ibuprofen 24 hours.   Continue to monitor your symptoms.  You may return to the ER for any new or concerning symptoms.

## 2020-12-18 NOTE — ED Triage Notes (Addendum)
Patient reports she had hysterectomy on 4/27. States since that time she has had post op infection drained and treated for UTI. States completed antibiotics x4 days ago and noticed odor and drainage returned from incision and vagina.

## 2020-12-19 LAB — URINE CULTURE: Culture: <10000 — AB

## 2020-12-19 LAB — GC/CHLAMYDIA PROBE AMP (~~LOC~~) NOT AT ARMC
Chlamydia: NEGATIVE
Comment: NEGATIVE
Comment: NORMAL
Neisseria Gonorrhea: NEGATIVE

## 2020-12-30 ENCOUNTER — Other Ambulatory Visit: Payer: Self-pay

## 2020-12-30 ENCOUNTER — Emergency Department (HOSPITAL_COMMUNITY)
Admission: EM | Admit: 2020-12-30 | Discharge: 2020-12-30 | Disposition: A | Discharge status: Home / Self Care | Payer: BLUE CROSS/BLUE SHIELD | Attending: Emergency Medicine | Admitting: Emergency Medicine

## 2020-12-30 ENCOUNTER — Encounter (HOSPITAL_COMMUNITY): Payer: Self-pay

## 2020-12-30 DIAGNOSIS — M545 Low back pain, unspecified: Secondary | ICD-10-CM | POA: Insufficient documentation

## 2020-12-30 MED ORDER — KETOROLAC TROMETHAMINE 30 MG/ML IJ SOLN
30.0000 mg | Freq: Once | INTRAMUSCULAR | Status: AC
  Administered 2020-12-30: 30 mg via INTRAMUSCULAR
  Filled 2020-12-30: qty 1

## 2020-12-30 MED ORDER — PREDNISONE 10 MG (21) PO TBPK: ORAL_TABLET | Freq: Every day | ORAL | 0 refills | Status: DC

## 2020-12-30 MED ORDER — METHOCARBAMOL 500 MG PO TABS: 500.0000 mg | ORAL_TABLET | Freq: Two times a day (BID) | ORAL | 0 refills | Status: DC

## 2020-12-30 NOTE — ED Provider Notes (Signed)
[facility] DEPT Provider Note   CSN: [id number] Arrival date & time: 12/30/20  1125     History Chief Complaint  Patient presents with  . Back Pain    Karen Lutz is a 50 y.o. female.  HPI   Pt is a 50 y/o female with a h/o gerd, anemia, fibroids, heart murmur, uti who presents to the ED today for eval of lower back pain. States pain started about 4 days ago. Pain is constant in nature and she rates pain 9/10. Reports pain radiates down the lle. Pain is worse when laying down. She has tried advil and tylenol without resolution of symptoms. Pt denies any numbness/tingling/weakness to the BLE. Denies saddle anesthesia. Denies loss of control of bowels or bladder. No urinary retention. No fevers. Denies a h/o IVDU. Denies a h/o CA or recent unintended weight loss. Denies urinary sxs. Denies chest pain, sob, abd pain. Denies recent trauma or heavy lifting.   Past Medical History:  Diagnosis Date  . Acid reflux   . Anemia   . Fibroids   . Heart murmur   . UTI (lower urinary tract infection)     Patient Active Problem List   Diagnosis Date Noted  . Visit for wound check 10/03/2020  . OA (osteoarthritis) of knee 05/09/2020  . Epigastric pain 07/03/2013  . Anemia 07/03/2013  . Migraine 07/03/2013  . Mood disorder ([place]) 02/15/2011  . Iron deficiency anemia 02/15/2011  . Uterine fibroid 02/15/2011  . GERD (gastroesophageal reflux disease) 02/15/2011    Past Surgical History:  Procedure Laterality Date  . ceaserian    . CESAREAN SECTION    . CESAREAN SECTION    . KNEE SURGERY    . KNEE SURGERY       OB History   No obstetric history on file.     Family History  Problem Relation Age of Onset  . Hypertension Other   . Cancer Other   . Alcohol abuse Mother   . Heart disease Mother   . Alcohol abuse Father   . Heart disease Father   . Arthritis Maternal Grandmother   . Arthritis Maternal Grandfather   . Arthritis Paternal  Grandmother   . Arthritis Paternal Grandfather     Social History   Tobacco Use  . Smoking status: Never Smoker  . Smokeless tobacco: Never Used  Vaping Use  . Vaping Use: Never used  Substance Use Topics  . Alcohol use: No  . Drug use: No    Home Medications Prior to Admission medications   Medication Sig Start Date End Date Taking? Authorizing Provider  methocarbamol (ROBAXIN) 500 MG tablet Take 1 tablet (500 mg total) by mouth 2 (two) times daily. 12/30/20  Yes Renny Gunnarson S, PA-C  predniSONE (STERAPRED UNI-PAK 21 TAB) 10 MG (21) TBPK tablet Take by mouth daily. Take 6 tabs by mouth daily  for 2 days, then 5 tabs for 2 days, then 4 tabs for 2 days, then 3 tabs for 2 days, 2 tabs for 2 days, then 1 tab by mouth daily for 2 days 12/30/20  Yes Maddilyn Campus S, PA-C  HYDROcodone-acetaminophen (NORCO/VICODIN) 5-325 MG tablet Take 1 tablet by mouth every 6 (six) hours as needed for moderate pain or severe pain. 11/24/20   Lamptey, Myrene Galas, MD  lidocaine (LIDODERM) 5 % Place 1 patch onto the skin daily. Remove & Discard patch within 12 hours or as directed by MD 11/24/20   Maudie Flakes, MD  norethindrone (AYGESTIN) 5 MG tablet Take 5 mg by mouth 2 (two) times daily. 10/03/20   [provider]  albuterol (VENTOLIN HFA) 108 (90 Base) MCG/ACT inhaler Inhale 2 puffs into the lungs every 4 (four) hours as needed for wheezing or shortness of breath. Patient not taking: Reported on 10/30/2020 09/08/20 11/24/20  Hans Eden, NP    Allergies    Penicillins and Penicillin g  Review of Systems   Review of Systems  Constitutional: Negative for fever.  Respiratory: Negative for shortness of breath.   Cardiovascular: Negative for chest pain.  Gastrointestinal: Negative for abdominal pain, blood in stool, constipation, diarrhea, nausea and vomiting.  Genitourinary: Negative for dysuria, flank pain, frequency, hematuria and urgency.  Musculoskeletal: Positive for back pain. Negative  for gait problem.  Skin: Negative for wound.  Neurological: Negative for weakness and numbness.    Physical Exam Updated Vital Signs BP (!) 118/96 (BP Location: Left Arm)   Pulse (!) 116   Temp 98.2 F (36.8 C) (Oral)   Resp 16   Ht 5\' 6"  (1.676 m)   Wt 129.3 kg   LMP  (LMP Unknown)   SpO2 99%   BMI 46.00 kg/m   Physical Exam Vitals and nursing note reviewed.  Constitutional:      General: She is not in acute distress.    Appearance: She is well-developed.  HENT:     Head: Normocephalic and atraumatic.  Eyes:     Conjunctiva/sclera: Conjunctivae normal.  Cardiovascular:     Rate and Rhythm: Normal rate.  Pulmonary:     Effort: Pulmonary effort is normal.  Musculoskeletal:        General: Normal range of motion.     Cervical back: Neck supple.     Comments: Mild midline lumbar ttp with ttp to the bilat lumbar paraspinous muscles. Pt has 5/5 strength to the ble with normal sensation throughout and steady gait.   Skin:    General: Skin is warm and dry.  Neurological:     Mental Status: She is alert.     ED Results / Procedures / Treatments   Labs (all labs ordered are listed, but only abnormal results are displayed) Labs Reviewed - No data to display  EKG None  Radiology No results found.  Procedures Procedures   Medications Ordered in ED Medications  ketorolac (TORADOL) 30 MG/ML injection 30 mg (has no administration in time range)    ED Course  I have reviewed the triage vital signs and the nursing notes.  Pertinent labs & imaging results that were available during my care of the patient were reviewed by me and considered in my medical decision making (see chart for details).    MDM Rules/Calculators/A&P                          Patient with back pain.  No neurological deficits and normal neuro exam.  Patient can walk but states is painful.  No loss of bowel or bladder control.  No concern for cauda equina.  No fever, night sweats, weight loss,  h/o cancer, IVDU.  RICE protocol and pain medicine indicated and discussed with patient.    Final Clinical Impression(s) / ED Diagnoses Final diagnoses:  Low back pain, unspecified back pain laterality, unspecified chronicity, unspecified whether sciatica present    Rx / DC Orders ED Discharge Orders         Ordered    predniSONE (STERAPRED UNI-PAK 21  TAB) 10 MG (21) TBPK tablet  Daily        12/30/20 1300    methocarbamol (ROBAXIN) 500 MG tablet  2 times daily        12/30/20 1300           Rodney Booze, PA-C 12/30/20 1301    Luna Fuse, MD 12/31/20 2001

## 2020-12-30 NOTE — Discharge Instructions (Addendum)
Take prednisone as directed.   You were given a prescription for Robaxin which is a muscle relaxer.  You should not drive, work, or operate machinery while taking this medication as it can make you very drowsy.    Please follow up with your primary care provider within 5-7 days for re-evaluation of your symptoms.   Return to the emergency department immediately if you experience any back pain associated with fevers, loss of control of your bowels/bladder, weakness/numbness to your legs, numbness to your groin area, inability to walk, or inability to urinate.

## 2020-12-30 NOTE — ED Triage Notes (Addendum)
Patient c/o bilateral lower back pain thst radiates into bilateral buttocks x 4 days. Patient denies any injury or heavy lifting. Patient states she has tingling I her left hand, Patient states she has a history of left shoulder pain

## 2021-01-01 ENCOUNTER — Other Ambulatory Visit: Payer: Self-pay

## 2021-01-01 ENCOUNTER — Emergency Department (HOSPITAL_COMMUNITY): Payer: BLUE CROSS/BLUE SHIELD

## 2021-01-01 ENCOUNTER — Encounter (HOSPITAL_COMMUNITY): Payer: Self-pay | Admitting: Emergency Medicine

## 2021-01-01 ENCOUNTER — Emergency Department (HOSPITAL_COMMUNITY)
Admission: EM | Admit: 2021-01-01 | Discharge: 2021-01-02 | Disposition: A | Discharge status: Home / Self Care | Payer: BLUE CROSS/BLUE SHIELD | Attending: Emergency Medicine | Admitting: Emergency Medicine

## 2021-01-01 DIAGNOSIS — K219 Gastro-esophageal reflux disease without esophagitis: Secondary | ICD-10-CM | POA: Diagnosis not present

## 2021-01-01 DIAGNOSIS — R110 Nausea: Secondary | ICD-10-CM | POA: Insufficient documentation

## 2021-01-01 DIAGNOSIS — R630 Anorexia: Secondary | ICD-10-CM | POA: Diagnosis not present

## 2021-01-01 DIAGNOSIS — R197 Diarrhea, unspecified: Secondary | ICD-10-CM | POA: Diagnosis not present

## 2021-01-01 DIAGNOSIS — R1013 Epigastric pain: Secondary | ICD-10-CM | POA: Diagnosis not present

## 2021-01-01 LAB — CBC WITH DIFFERENTIAL/PLATELET
Abs Immature Granulocytes: 0.04 10*3/uL (ref 0.00–0.07)
Basophils Absolute: 0 10*3/uL (ref 0.0–0.1)
Basophils Relative: 0 %
Eosinophils Absolute: 0 10*3/uL (ref 0.0–0.5)
Eosinophils Relative: 0 %
HCT: 31.6 % — ABNORMAL LOW (ref 36.0–46.0)
Hemoglobin: 9.4 g/dL — ABNORMAL LOW (ref 12.0–15.0)
Immature Granulocytes: 1 %
Lymphocytes Relative: 31 %
Lymphs Abs: 2 10*3/uL (ref 0.7–4.0)
MCH: 22.9 pg — ABNORMAL LOW (ref 26.0–34.0)
MCHC: 29.7 g/dL — ABNORMAL LOW (ref 30.0–36.0)
MCV: 77.1 fL — ABNORMAL LOW (ref 80.0–100.0)
Monocytes Absolute: 0.4 10*3/uL (ref 0.1–1.0)
Monocytes Relative: 6 %
Neutro Abs: 4 10*3/uL (ref 1.7–7.7)
Neutrophils Relative %: 62 %
Platelets: 285 10*3/uL (ref 150–400)
RBC: 4.1 MIL/uL (ref 3.87–5.11)
RDW: 16 % — ABNORMAL HIGH (ref 11.5–15.5)
WBC: 6.4 10*3/uL (ref 4.0–10.5)
nRBC: 0 % (ref 0.0–0.2)

## 2021-01-01 MED ORDER — LIDOCAINE VISCOUS HCL 2 % MT SOLN
15.0000 mL | Freq: Once | OROMUCOSAL | Status: AC
  Administered 2021-01-01: 15 mL via ORAL
  Filled 2021-01-01: qty 15

## 2021-01-01 MED ORDER — HYDROMORPHONE HCL 1 MG/ML IJ SOLN
1.0000 mg | Freq: Once | INTRAMUSCULAR | Status: AC
  Administered 2021-01-01: 1 mg via INTRAVENOUS
  Filled 2021-01-01: qty 1

## 2021-01-01 MED ORDER — SODIUM CHLORIDE 0.9 % IV BOLUS
1000.0000 mL | Freq: Once | INTRAVENOUS | Status: AC
  Administered 2021-01-01: 1000 mL via INTRAVENOUS

## 2021-01-01 MED ORDER — ONDANSETRON HCL 4 MG/2ML IJ SOLN
4.0000 mg | Freq: Once | INTRAMUSCULAR | Status: AC
  Administered 2021-01-01: 4 mg via INTRAVENOUS
  Filled 2021-01-01: qty 2

## 2021-01-01 MED ORDER — ALUM & MAG HYDROXIDE-SIMETH 200-200-20 MG/5ML PO SUSP
30.0000 mL | Freq: Once | ORAL | Status: AC
  Administered 2021-01-01: 30 mL via ORAL
  Filled 2021-01-01: qty 30

## 2021-01-01 NOTE — ED Provider Notes (Signed)
Emergency Medicine Provider Triage Evaluation Note  Karen Lutz , a 50 y.o. female  was evaluated in triage.  Pt complains of epigastric abdominal pain. Hx of same, called gastritis. Takes antacids. Some loose stools.  Review of Systems  Positive: abd pain Negative: Fever, N/V, urinary sx.  Physical Exam  BP (!) 176/104 (BP Location: Left Arm)   Pulse (!) 104   Temp 97.7 F (36.5 C) (Oral)   Resp (!) 22   Ht 5\' 6"  (1.676 m)   Wt 129.3 kg   LMP  (LMP Unknown)   SpO2 100%   BMI 46.00 kg/m  Gen:   Awake, no distress   Resp:  Normal effort  MSK:   Moves extremities without difficulty  Other:  Rubbing her abdomen, soft, TTP upper abdomen favoring epigastric and RUQ  Medical Decision Making  Medically screening exam initiated at 10:01 PM.  Appropriate orders placed.  Sharen Heck Almquist was informed that the remainder of the evaluation will be completed by another provider, this initial triage assessment does not replace that evaluation, and the importance of remaining in the ED until their evaluation is complete.  RUQ U/S, labs   Rhonda Vangieson, [place] N, PA-C 01/01/21 2203    Arnaldo Natal, MD 01/02/21 [phone]

## 2021-01-01 NOTE — ED Notes (Signed)
US at bedside

## 2021-01-01 NOTE — ED Provider Notes (Signed)
[facility] DEPT Provider Note   CSN: [id number] Arrival date & time: 01/01/21  2141     History Chief Complaint  Patient presents with  . Abdominal Pain    Karen Lutz is a 50 y.o. female.  The history is provided by the patient.  Abdominal Pain Pain location:  Epigastric Pain radiates to:  Does not radiate Pain severity:  Moderate Onset quality:  Sudden Duration:  1 day Timing:  Constant Progression:  Worsening Chronicity:  New Context comment:  Occurred after eating at [employer] by:  Nothing Worsened by:  Nothing Ineffective treatments: Mylanta. Associated symptoms: anorexia, diarrhea and nausea   Associated symptoms: no chest pain, no chills, no constipation, no cough, no dysuria, no fever, no hematuria, no shortness of breath, no sore throat and no vomiting        Past Medical History:  Diagnosis Date  . Acid reflux   . Anemia   . Fibroids   . Heart murmur   . UTI (lower urinary tract infection)     Patient Active Problem List   Diagnosis Date Noted  . Visit for wound check 10/03/2020  . OA (osteoarthritis) of knee 05/09/2020  . Epigastric pain 07/03/2013  . Anemia 07/03/2013  . Migraine 07/03/2013  . Mood disorder ([place]) 02/15/2011  . Iron deficiency anemia 02/15/2011  . Uterine fibroid 02/15/2011  . GERD (gastroesophageal reflux disease) 02/15/2011    Past Surgical History:  Procedure Laterality Date  . ceaserian    . CESAREAN SECTION    . CESAREAN SECTION    . KNEE SURGERY    . KNEE SURGERY       OB History   No obstetric history on file.     Family History  Problem Relation Age of Onset  . Hypertension Other   . Cancer Other   . Alcohol abuse Mother   . Heart disease Mother   . Alcohol abuse Father   . Heart disease Father   . Arthritis Maternal Grandmother   . Arthritis Maternal Grandfather   . Arthritis Paternal Grandmother   . Arthritis Paternal Grandfather     Social  History   Tobacco Use  . Smoking status: Never Smoker  . Smokeless tobacco: Never Used  Vaping Use  . Vaping Use: Never used  Substance Use Topics  . Alcohol use: No  . Drug use: No    Home Medications Prior to Admission medications   Medication Sig Start Date End Date Taking? Authorizing Provider  HYDROcodone-acetaminophen (NORCO/VICODIN) 5-325 MG tablet Take 1 tablet by mouth every 6 (six) hours as needed for moderate pain or severe pain. 11/24/20   Lamptey, Myrene Galas, MD  lidocaine (LIDODERM) 5 % Place 1 patch onto the skin daily. Remove & Discard patch within 12 hours or as directed by MD 11/24/20   Maudie Flakes, MD  methocarbamol (ROBAXIN) 500 MG tablet Take 1 tablet (500 mg total) by mouth 2 (two) times daily. 12/30/20   Couture, Cortni S, PA-C  norethindrone (AYGESTIN) 5 MG tablet Take 5 mg by mouth 2 (two) times daily. 10/03/20   [provider]  predniSONE (STERAPRED UNI-PAK 21 TAB) 10 MG (21) TBPK tablet Take by mouth daily. Take 6 tabs by mouth daily  for 2 days, then 5 tabs for 2 days, then 4 tabs for 2 days, then 3 tabs for 2 days, 2 tabs for 2 days, then 1 tab by mouth daily for 2 days 12/30/20   Tivis Ringer, Cortni  S, PA-C  albuterol (VENTOLIN HFA) 108 (90 Base) MCG/ACT inhaler Inhale 2 puffs into the lungs every 4 (four) hours as needed for wheezing or shortness of breath. Patient not taking: Reported on 10/30/2020 09/08/20 11/24/20  Hans Eden, NP    Allergies    Penicillins and Penicillin g  Review of Systems   Review of Systems  Constitutional: Negative for chills and fever.  HENT: Negative for ear pain and sore throat.   Eyes: Negative for pain and visual disturbance.  Respiratory: Negative for cough and shortness of breath.   Cardiovascular: Negative for chest pain and palpitations.  Gastrointestinal: Positive for abdominal pain, anorexia, diarrhea and nausea. Negative for constipation and vomiting.  Genitourinary: Negative for dysuria and hematuria.   Musculoskeletal: Negative for arthralgias and back pain.  Skin: Negative for color change and rash.  Neurological: Negative for seizures and syncope.  All other systems reviewed and are negative.   Physical Exam Updated Vital Signs BP (!) 176/104 (BP Location: Left Arm)   Pulse (!) 104   Temp 97.7 F (36.5 C) (Oral)   Resp (!) 22   Ht 5\' 6"  (1.676 m)   Wt 129.3 kg   LMP  (LMP Unknown)   SpO2 100%   BMI 46.00 kg/m   Physical Exam Vitals and nursing note reviewed.  Constitutional:      General: She is not in acute distress.    Appearance: She is well-developed.  HENT:     Head: Normocephalic and atraumatic.  Eyes:     Conjunctiva/sclera: Conjunctivae normal.  Cardiovascular:     Rate and Rhythm: Regular rhythm. Tachycardia present.     Heart sounds: No murmur heard.   Pulmonary:     Effort: Pulmonary effort is normal. No respiratory distress.     Breath sounds: Normal breath sounds.  Abdominal:     General: Bowel sounds are normal. There is no distension.     Palpations: Abdomen is soft.     Tenderness: There is abdominal tenderness in the right upper quadrant, epigastric area and left upper quadrant.  Musculoskeletal:     Cervical back: Neck supple.  Skin:    General: Skin is warm and dry.  Neurological:     General: No focal deficit present.     Mental Status: She is alert.  Psychiatric:        Mood and Affect: Mood normal.        Behavior: Behavior normal.     ED Results / Procedures / Treatments   Labs (all labs ordered are listed, but only abnormal results are displayed) Labs Reviewed  COMPREHENSIVE METABOLIC PANEL - Abnormal; Notable for the following components:      Result Value   Glucose, Bld 102 (*)    AST 13 (*)    All other components within normal limits  CBC WITH DIFFERENTIAL/PLATELET - Abnormal; Notable for the following components:   Hemoglobin 9.4 (*)    HCT 31.6 (*)    MCV 77.1 (*)    MCH 22.9 (*)    MCHC 29.7 (*)    RDW 16.0 (*)     All other components within normal limits  LIPASE, BLOOD    EKG None  Radiology CT Abdomen Pelvis Wo Contrast  Result Date: 01/02/2021 CLINICAL DATA:  Epigastric and umbilical pain EXAM: CT ABDOMEN AND PELVIS WITHOUT CONTRAST TECHNIQUE: Multidetector CT imaging of the abdomen and pelvis was performed following the standard protocol without IV contrast. COMPARISON:  Ultrasound 01/01/2021, CT 10/30/2020, 12/26/2018  FINDINGS: Lower chest: Lung bases demonstrate no acute consolidation or effusion. Hepatobiliary: No focal liver abnormality is seen. No gallstones, gallbladder wall thickening, or biliary dilatation. Pancreas: Unremarkable. No pancreatic ductal dilatation or surrounding inflammatory changes. Spleen: Normal in size without focal abnormality. Adrenals/Urinary Tract: Adrenal glands are unremarkable. Kidneys are normal, without renal calculi, focal lesion, or hydronephrosis. Bladder is unremarkable. Stomach/Bowel: Stomach is within normal limits. Appendix appears normal. No evidence of bowel wall thickening, distention, or inflammatory changes. Vascular/Lymphatic: No significant vascular findings are present. No enlarged abdominal or pelvic lymph nodes. Reproductive: Interval hysterectomy. Small amounts of gas near the vaginal cuff. Soft tissue stranding in the deep pelvis. Low anterior scar with 13 by 46 mm small gas and fluid collection. Other: Negative for free air.  No pelvic effusion. Musculoskeletal: No acute osseous abnormality. IMPRESSION: 1. Interval hysterectomy. 13 x 46 mm gas and fluid collection deep to the patient's low anterior scar, indeterminate for residual postoperative collection or infected fluid collection. Small amounts of gas near the vaginal cuff are also indeterminate for potential small gas and fluid collection related to prior hysterectomy. 2. Negative for bowel obstruction or other acute abnormality within the abdomen and pelvis Electronically Signed   By: Donavan Foil  M.D.   On: 01/02/2021 01:58   US Abdomen Limited RUQ (LIVER/GB)  Result Date: 01/01/2021 CLINICAL DATA:  Epigastric pain for 1 day EXAM: ULTRASOUND ABDOMEN LIMITED RIGHT UPPER QUADRANT COMPARISON:  10/30/2020 FINDINGS: Gallbladder: No gallstones or wall thickening visualized. No sonographic Murphy sign noted by sonographer. Common bile duct: Diameter: 3.7 mm. Liver: No focal lesion identified. Within normal limits in parenchymal echogenicity. Portal vein is patent on color Doppler imaging with normal direction of blood flow towards the liver. Other: None. IMPRESSION: No acute abnormality noted. Electronically Signed   By: Inez Catalina M.D.   On: 01/01/2021 22:51    Procedures Procedures   Medications Ordered in ED Medications  sodium chloride 0.9 % bolus 1,000 mL (0 mLs Intravenous Stopped 01/01/21 2350)  HYDROmorphone (DILAUDID) injection 1 mg (1 mg Intravenous Given 01/01/21 2251)  ondansetron (ZOFRAN) injection 4 mg (4 mg Intravenous Given 01/01/21 2250)  alum & mag hydroxide-simeth (MAALOX/MYLANTA) 200-200-20 MG/5ML suspension 30 mL (30 mLs Oral Given 01/01/21 2338)    And  lidocaine (XYLOCAINE) 2 % viscous mouth solution 15 mL (15 mLs Oral Given 01/01/21 2338)    ED Course  I have reviewed the triage vital signs and the nursing notes.  Pertinent labs & imaging results that were available during my care of the patient were reviewed by me and considered in my medical decision making (see chart for details).    MDM Rules/Calculators/A&P                          Sharen Heck Bruemmer scented with upper abdominal pain.  I was immediately suspicious for liver or gallbladder pathology, gastritis, pancreatitis, or other intra-abdominal condition.  Ultrasound was normal, and she will receive a CT scan.  She is awaiting labs. Final Clinical Impression(s) / ED Diagnoses Final diagnoses:  Epigastric abdominal pain  Epigastric pain    Rx / DC Orders ED Discharge Orders    None       Arnaldo Natal, MD 01/02/21 [id number]

## 2021-01-01 NOTE — ED Triage Notes (Signed)
Patient arrives complaining of epigastric and umbilical pain. Patient states the pain doesn't radiate anywhere. Denies N/V. Patient states she has had this before, but doesn't remember what the diagnosis was.

## 2021-01-02 ENCOUNTER — Emergency Department (HOSPITAL_COMMUNITY): Payer: BLUE CROSS/BLUE SHIELD

## 2021-01-02 LAB — COMPREHENSIVE METABOLIC PANEL
ALT: 9 U/L (ref 0–44)
AST: 13 U/L — ABNORMAL LOW (ref 15–41)
Albumin: 3.7 g/dL (ref 3.5–5.0)
Alkaline Phosphatase: 73 U/L (ref 38–126)
Anion gap: 8 (ref 5–15)
BUN: 15 mg/dL (ref 6–20)
CO2: 25 mmol/L (ref 22–32)
Calcium: 9.9 mg/dL (ref 8.9–10.3)
Chloride: 106 mmol/L (ref 98–111)
Creatinine, Ser: 0.57 mg/dL (ref 0.44–1.00)
GFR, Estimated: >60 mL/min (ref >60)
Glucose, Bld: 102 mg/dL — ABNORMAL HIGH (ref 70–99)
Potassium: 3.9 mmol/L (ref 3.5–5.1)
Sodium: 139 mmol/L (ref 135–145)
Total Bilirubin: 0.3 mg/dL (ref 0.3–1.2)
Total Protein: 7.2 g/dL (ref 6.5–8.1)

## 2021-01-02 LAB — LIPASE, BLOOD: Lipase: 33 U/L (ref 11–51)

## 2021-01-02 NOTE — ED Provider Notes (Signed)
I received the patient in signout from Dr. Joya Gaskins, briefly the patient is a 50 year old female with a chief complaints of epigastric abdominal pain after eating at an [employer].  There was some concern for gallbladder disease and so an ultrasound was performed and due to body habitus a CT scan was ordered afterwards.  Awaiting blood work and CT.  Lab work is resulted without LFT elevation lipase is normal.  CT scan without any findings to the upper portion of the abdomen there are signs of her hysterectomy that was done about a month ago.  We will have her take H2 blockers.  PCP follow-up.   Deno Etienne, DO 01/02/21 0215

## 2021-01-02 NOTE — Discharge Instructions (Signed)
Try pepcid or tagamet up to twice a day.  Try to avoid things that may make this worse, most commonly these are spicy foods tomato based products fatty foods chocolate and peppermint.  Alcohol and tobacco can also make this worse.  Return to the emergency department for sudden worsening pain fever or inability to eat or drink.  

## 2021-02-15 ENCOUNTER — Encounter (HOSPITAL_COMMUNITY): Payer: Self-pay

## 2021-02-15 ENCOUNTER — Other Ambulatory Visit: Payer: Self-pay

## 2021-02-15 ENCOUNTER — Emergency Department (HOSPITAL_COMMUNITY)
Admission: EM | Admit: 2021-02-15 | Discharge: 2021-02-15 | Disposition: A | Discharge status: Home / Self Care | Payer: BLUE CROSS/BLUE SHIELD | Attending: Emergency Medicine | Admitting: Emergency Medicine

## 2021-02-15 DIAGNOSIS — M5412 Radiculopathy, cervical region: Secondary | ICD-10-CM | POA: Insufficient documentation

## 2021-02-15 DIAGNOSIS — M542 Cervicalgia: Secondary | ICD-10-CM | POA: Diagnosis present

## 2021-02-15 DIAGNOSIS — M5417 Radiculopathy, lumbosacral region: Secondary | ICD-10-CM | POA: Insufficient documentation

## 2021-02-15 MED ORDER — METHYLPREDNISOLONE 4 MG PO TBPK: ORAL_TABLET | ORAL | 0 refills | Status: AC

## 2021-02-15 MED ORDER — DEXAMETHASONE SODIUM PHOSPHATE 10 MG/ML IJ SOLN
10.0000 mg | Freq: Once | INTRAMUSCULAR | Status: AC
  Administered 2021-02-15: 10 mg via INTRAMUSCULAR
  Filled 2021-02-15: qty 1

## 2021-02-15 NOTE — ED Provider Notes (Signed)
[facility] DEPT Provider Note   CSN: [id number] Arrival date & time: 02/15/21  1619     History Chief Complaint  Patient presents with   Shoulder Pain    Karen Lutz is a 50 y.o. female.  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  She states that she has had recurrent pain in the posterior aspect of her left shoulder.  This is worse with twisting and bending.  The patient also has pain in her left posterior hip.  She has achieved relief with steroids in the past, and she requested this treatment.  The history is provided by the patient.  Back Pain Location:  Lumbar spine (cervical spine) Quality:  Shooting Radiates to:  L shoulder and L thigh Pain severity:  Moderate Pain is:  Same all the time Onset quality:  Gradual Duration:  3 days Timing:  Constant Progression:  Worsening Chronicity:  Recurrent Context: not recent illness and not recent injury   Relieved by:  Nothing Worsened by:  Movement Ineffective treatments:  NSAIDs Associated symptoms: tingling   Associated symptoms: no abdominal pain, no bladder incontinence, no bowel incontinence, no chest pain, no dysuria, no fever, no weakness and no weight loss       Past Medical History:  Diagnosis Date   Acid reflux    Anemia    Fibroids    Heart murmur    UTI (lower urinary tract infection)     Patient Active Problem List   Diagnosis Date Noted   Visit for wound check 10/03/2020   OA (osteoarthritis) of knee 05/09/2020   Epigastric pain 07/03/2013   Anemia 07/03/2013   Migraine 07/03/2013   Mood disorder ([place]) 02/15/2011   Iron deficiency anemia 02/15/2011   Uterine fibroid 02/15/2011   GERD (gastroesophageal reflux disease) 02/15/2011    Past Surgical History:  Procedure Laterality Date   ceaserian     CESAREAN SECTION     CESAREAN SECTION     KNEE SURGERY     KNEE SURGERY       OB History   No obstetric history on file.     Family History  Problem  Relation Age of Onset   Hypertension Other    Cancer Other    Alcohol abuse Mother    Heart disease Mother    Alcohol abuse Father    Heart disease Father    Arthritis Maternal Grandmother    Arthritis Maternal Grandfather    Arthritis Paternal Grandmother    Arthritis Paternal Grandfather     Social History   Tobacco Use   Smoking status: Never   Smokeless tobacco: Never  Vaping Use   Vaping Use: Never used  Substance Use Topics   Alcohol use: No   Drug use: No    Home Medications Prior to Admission medications   Medication Sig Start Date End Date Taking? Authorizing Provider  methylPREDNISolone (MEDROL DOSEPAK) 4 MG TBPK tablet Take 6 tablets (24 mg total) by mouth daily for 1 day, THEN 5 tablets (20 mg total) daily for 1 day, THEN 4 tablets (16 mg total) daily for 1 day, THEN 3 tablets (12 mg total) daily for 1 day, THEN 2 tablets (8 mg total) daily for 1 day. 02/15/21 02/20/21 Yes Arnaldo Natal, MD  HYDROcodone-acetaminophen (NORCO/VICODIN) 5-325 MG tablet Take 1 tablet by mouth every 6 (six) hours as needed for moderate pain or severe pain. 11/24/20   Chase Picket, MD  lidocaine (LIDODERM) 5 % Place  1 patch onto the skin daily. Remove & Discard patch within 12 hours or as directed by MD 11/24/20   Maudie Flakes, MD  methocarbamol (ROBAXIN) 500 MG tablet Take 1 tablet (500 mg total) by mouth 2 (two) times daily. 12/30/20   Couture, Cortni S, PA-C  norethindrone (AYGESTIN) 5 MG tablet Take 5 mg by mouth 2 (two) times daily. 10/03/20   [provider]  albuterol (VENTOLIN HFA) 108 (90 Base) MCG/ACT inhaler Inhale 2 puffs into the lungs every 4 (four) hours as needed for wheezing or shortness of breath. Patient not taking: Reported on 10/30/2020 09/08/20 11/24/20  Hans Eden, NP    Allergies    Penicillins and Penicillin g  Review of Systems   Review of Systems  Constitutional:  Negative for chills, fever and weight loss.  HENT:  Negative for ear pain and  sore throat.   Eyes:  Negative for pain and visual disturbance.  Respiratory:  Negative for cough and shortness of breath.   Cardiovascular:  Negative for chest pain and palpitations.  Gastrointestinal:  Negative for abdominal pain, bowel incontinence and vomiting.  Genitourinary:  Negative for bladder incontinence, dysuria and hematuria.  Musculoskeletal:  Positive for back pain. Negative for arthralgias.  Skin:  Negative for color change and rash.  Neurological:  Positive for tingling. Negative for seizures, syncope and weakness.  All other systems reviewed and are negative.  Physical Exam Updated Vital Signs BP (!) 145/101 (BP Location: Right Arm) Comment: pt states HTN when she is in pain  Pulse (!) 103   Temp 98.7 F (37.1 C) (Oral)   Resp 16   Ht 5\' 6"  (1.676 m)   Wt 131.5 kg   LMP  (LMP Unknown)   SpO2 100%   BMI 46.81 kg/m   Physical Exam Neck:     Comments: No tenderness to palpation about the cervical spine.  Neck rotation is full but there is increased pain with extension and leftward rotation. Musculoskeletal:     Lumbar back: Positive left straight leg raise test.  Neurological:     Sensory: Sensation is intact.     Motor: No weakness.    ED Results / Procedures / Treatments   Labs (all labs ordered are listed, but only abnormal results are displayed) Labs Reviewed - No data to display  EKG None  Radiology No results found.  Procedures Procedures   Medications Ordered in ED Medications  dexamethasone (DECADRON) injection 10 mg (has no administration in time range)    ED Course  I have reviewed the triage vital signs and the nursing notes.  Pertinent labs & imaging results that were available during my care of the patient were reviewed by me and considered in my medical decision making (see chart for details).    MDM Rules/Calculators/A&P                          Karen Lutz has a history of cervical radiculopathy and presents with also  some mild symptoms of lumbar radiculopathy.  No red flag symptoms.  No neurologic compromise.  I did give her a prescription for steroids, but I also recommended that she consider physical therapy.  She has a primary care provider and health insurance.  She is willing to consider this route. Final Clinical Impression(s) / ED Diagnoses Final diagnoses:  Cervical radiculopathy  Lumbosacral radiculopathy    Rx / DC Orders ED Discharge Orders  Ordered    methylPREDNISolone (MEDROL DOSEPAK) 4 MG TBPK tablet        02/15/21 1713             Arnaldo Natal, MD 02/15/21 1718

## 2021-02-15 NOTE — ED Triage Notes (Signed)
Pt c/o left shoulder pain starting Monday. Pt states it feels like something is pinching her shoulder.

## 2021-02-18 ENCOUNTER — Other Ambulatory Visit: Payer: Self-pay | Admitting: Family Medicine

## 2021-02-22 ENCOUNTER — Other Ambulatory Visit: Payer: Self-pay | Admitting: *Deleted

## 2021-02-22 MED ORDER — MELOXICAM 7.5 MG PO TABS: 7.5000 mg | ORAL_TABLET | Freq: Two times a day (BID) | ORAL | 1 refills | Status: DC | PRN

## 2021-03-03 ENCOUNTER — Other Ambulatory Visit: Payer: Self-pay

## 2021-03-03 ENCOUNTER — Emergency Department (HOSPITAL_COMMUNITY)
Admission: EM | Admit: 2021-03-03 | Discharge: 2021-03-03 | Disposition: A | Discharge status: Home / Self Care | Payer: BLUE CROSS/BLUE SHIELD | Attending: Emergency Medicine | Admitting: Emergency Medicine

## 2021-03-03 DIAGNOSIS — N9489 Other specified conditions associated with female genital organs and menstrual cycle: Secondary | ICD-10-CM | POA: Insufficient documentation

## 2021-03-03 DIAGNOSIS — R110 Nausea: Secondary | ICD-10-CM | POA: Diagnosis not present

## 2021-03-03 DIAGNOSIS — R1013 Epigastric pain: Secondary | ICD-10-CM | POA: Insufficient documentation

## 2021-03-03 LAB — COMPREHENSIVE METABOLIC PANEL
ALT: 9 U/L (ref 0–44)
AST: 15 U/L (ref 15–41)
Albumin: 3.8 g/dL (ref 3.5–5.0)
Alkaline Phosphatase: 83 U/L (ref 38–126)
Anion gap: 7 (ref 5–15)
BUN: 13 mg/dL (ref 6–20)
CO2: 26 mmol/L (ref 22–32)
Calcium: 9.9 mg/dL (ref 8.9–10.3)
Chloride: 105 mmol/L (ref 98–111)
Creatinine, Ser: 0.67 mg/dL (ref 0.44–1.00)
GFR, Estimated: >60 mL/min (ref >60)
Glucose, Bld: 114 mg/dL — ABNORMAL HIGH (ref 70–99)
Potassium: 3.7 mmol/L (ref 3.5–5.1)
Sodium: 138 mmol/L (ref 135–145)
Total Bilirubin: 0.4 mg/dL (ref 0.3–1.2)
Total Protein: 7.4 g/dL (ref 6.5–8.1)

## 2021-03-03 LAB — CBC WITH DIFFERENTIAL/PLATELET
Abs Immature Granulocytes: 0.01 10*3/uL (ref 0.00–0.07)
Basophils Absolute: 0 10*3/uL (ref 0.0–0.1)
Basophils Relative: 0 %
Eosinophils Absolute: 0.1 10*3/uL (ref 0.0–0.5)
Eosinophils Relative: 2 %
HCT: 40.2 % (ref 36.0–46.0)
Hemoglobin: 11.9 g/dL — ABNORMAL LOW (ref 12.0–15.0)
Immature Granulocytes: 0 %
Lymphocytes Relative: 46 %
Lymphs Abs: 1.9 10*3/uL (ref 0.7–4.0)
MCH: 22.4 pg — ABNORMAL LOW (ref 26.0–34.0)
MCHC: 29.6 g/dL — ABNORMAL LOW (ref 30.0–36.0)
MCV: 75.6 fL — ABNORMAL LOW (ref 80.0–100.0)
Monocytes Absolute: 0.5 10*3/uL (ref 0.1–1.0)
Monocytes Relative: 11 %
Neutro Abs: 1.7 10*3/uL (ref 1.7–7.7)
Neutrophils Relative %: 41 %
Platelets: 266 10*3/uL (ref 150–400)
RBC: 5.32 MIL/uL — ABNORMAL HIGH (ref 3.87–5.11)
RDW: 15.8 % — ABNORMAL HIGH (ref 11.5–15.5)
WBC: 4.3 10*3/uL (ref 4.0–10.5)
nRBC: 0 % (ref 0.0–0.2)

## 2021-03-03 LAB — I-STAT BETA HCG BLOOD, ED (MC, WL, AP ONLY): I-stat hCG, quantitative: <5 m[IU]/mL (ref <5)

## 2021-03-03 LAB — URINALYSIS, ROUTINE W REFLEX MICROSCOPIC
Bilirubin Urine: NEGATIVE
Glucose, UA: NEGATIVE mg/dL
Hgb urine dipstick: NEGATIVE
Ketones, ur: NEGATIVE mg/dL
Nitrite: NEGATIVE
Protein, ur: 30 mg/dL — AB
Specific Gravity, Urine: 1.03 (ref 1.005–1.030)
pH: 5 (ref 5.0–8.0)

## 2021-03-03 LAB — LIPASE, BLOOD: Lipase: 39 U/L (ref 11–51)

## 2021-03-03 MED ORDER — ALUM & MAG HYDROXIDE-SIMETH 200-200-20 MG/5ML PO SUSP
30.0000 mL | Freq: Once | ORAL | Status: AC
  Administered 2021-03-03: 30 mL via ORAL
  Filled 2021-03-03: qty 30

## 2021-03-03 MED ORDER — LIDOCAINE VISCOUS HCL 2 % MT SOLN
15.0000 mL | Freq: Once | OROMUCOSAL | Status: AC
  Administered 2021-03-03: 15 mL via ORAL
  Filled 2021-03-03: qty 15

## 2021-03-03 MED ORDER — SODIUM CHLORIDE 0.9 % IV BOLUS
1000.0000 mL | Freq: Once | INTRAVENOUS | Status: AC
  Administered 2021-03-03: 1000 mL via INTRAVENOUS

## 2021-03-03 MED ORDER — ONDANSETRON HCL 4 MG/2ML IJ SOLN
4.0000 mg | Freq: Once | INTRAMUSCULAR | Status: AC
  Administered 2021-03-03: 4 mg via INTRAVENOUS
  Filled 2021-03-03: qty 2

## 2021-03-03 MED ORDER — FAMOTIDINE IN NACL 20-0.9 MG/50ML-% IV SOLN
20.0000 mg | INTRAVENOUS | Status: AC
  Administered 2021-03-03: 20 mg via INTRAVENOUS
  Filled 2021-03-03: qty 50

## 2021-03-03 MED ORDER — ONDANSETRON 4 MG PO TBDP: 4.0000 mg | ORAL_TABLET | Freq: Three times a day (TID) | ORAL | 0 refills | Status: DC | PRN

## 2021-03-03 MED ORDER — OMEPRAZOLE 20 MG PO CPDR: 20.0000 mg | DELAYED_RELEASE_CAPSULE | Freq: Every day | ORAL | 0 refills | Status: DC

## 2021-03-03 MED ORDER — FENTANYL CITRATE (PF) 100 MCG/2ML IJ SOLN
50.0000 ug | Freq: Once | INTRAMUSCULAR | Status: AC
Start: 2021-03-03 — End: 2021-03-03
  Administered 2021-03-03: 50 ug via INTRAVENOUS
  Filled 2021-03-03: qty 2

## 2021-03-03 NOTE — ED Provider Notes (Signed)
[facility] DEPT Provider Note   CSN: [id number] Arrival date & time: 03/03/21  0143     History Chief Complaint  Patient presents with   Abdominal Pain    Karen Lutz is a 51 y.o. female.  The history is provided by the patient and medical records.  Abdominal Pain Associated symptoms: nausea    50 y.o. F with hx of acid reflux, fibroids, anemia, presenting to the ED with epigastric abdominal pain.  States it started a few days ago, has been waxing and waning in severity since it began but has never fully gone away.  Denies radiation into the chest, no cough or SOB.  No fevers.  States she has been nauseated and has not been eating due to the pain.  She did have a normal BM yesterday.  History of acid reflux, states it just feels 10x worse than that.  Past Medical History:  Diagnosis Date   Acid reflux    Anemia    Fibroids    Heart murmur    UTI (lower urinary tract infection)     Patient Active Problem List   Diagnosis Date Noted   Visit for wound check 10/03/2020   OA (osteoarthritis) of knee 05/09/2020   Epigastric pain 07/03/2013   Anemia 07/03/2013   Migraine 07/03/2013   Mood disorder ([place]) 02/15/2011   Iron deficiency anemia 02/15/2011   Uterine fibroid 02/15/2011   GERD (gastroesophageal reflux disease) 02/15/2011    Past Surgical History:  Procedure Laterality Date   ceaserian     CESAREAN SECTION     CESAREAN SECTION     KNEE SURGERY     KNEE SURGERY       OB History   No obstetric history on file.     Family History  Problem Relation Age of Onset   Hypertension Other    Cancer Other    Alcohol abuse Mother    Heart disease Mother    Alcohol abuse Father    Heart disease Father    Arthritis Maternal Grandmother    Arthritis Maternal Grandfather    Arthritis Paternal Grandmother    Arthritis Paternal Grandfather     Social History   Tobacco Use   Smoking status: Never   Smokeless tobacco: Never   Vaping Use   Vaping Use: Never used  Substance Use Topics   Alcohol use: No   Drug use: No    Home Medications Prior to Admission medications   Medication Sig Start Date End Date Taking? Authorizing Provider  meloxicam (MOBIC) 7.5 MG tablet Take 1 tablet (7.5 mg total) by mouth 2 (two) times daily as needed for pain. 02/22/21  Yes Rosemarie Ax, MD  methocarbamol (ROBAXIN) 500 MG tablet Take 1 tablet (500 mg total) by mouth 2 (two) times daily. 12/30/20  Yes Couture, Cortni S, PA-C  omeprazole (PRILOSEC) 20 MG capsule Take 1 capsule (20 mg total) by mouth daily. 03/03/21  Yes Larene Pickett, PA-C  ondansetron (ZOFRAN ODT) 4 MG disintegrating tablet Take 1 tablet (4 mg total) by mouth every 8 (eight) hours as needed for nausea. 03/03/21  Yes Larene Pickett, PA-C  pantoprazole (PROTONIX) 20 MG tablet Take 40 mg by mouth daily. 12/02/20  Yes [provider]  albuterol (VENTOLIN HFA) 108 (90 Base) MCG/ACT inhaler Inhale 2 puffs into the lungs every 4 (four) hours as needed for wheezing or shortness of breath. Patient not taking: Reported on 10/30/2020 09/08/20 11/24/20  Hans Eden,  NP    Allergies    Penicillins and Penicillin g  Review of Systems   Review of Systems  Gastrointestinal:  Positive for abdominal pain and nausea.  All other systems reviewed and are negative.  Physical Exam Updated Vital Signs BP 121/71   Pulse 78   Temp 97.6 F (36.4 C) (Oral)   Resp 15   Ht '5\' 6"'$  (1.676 m)   Wt 131.5 kg   LMP  (LMP Unknown)   SpO2 98%   BMI 46.81 kg/m   Physical Exam Vitals and nursing note reviewed.  Constitutional:      Appearance: She is well-developed.  HENT:     Head: Normocephalic and atraumatic.  Eyes:     Conjunctiva/sclera: Conjunctivae normal.     Pupils: Pupils are equal, round, and reactive to light.  Cardiovascular:     Rate and Rhythm: Normal rate and regular rhythm.     Heart sounds: Normal heart sounds.  Pulmonary:     Effort: Pulmonary  effort is normal.     Breath sounds: Normal breath sounds.  Abdominal:     General: Bowel sounds are normal.     Palpations: Abdomen is soft.     Tenderness: There is no abdominal tenderness. There is no guarding or rebound.  Musculoskeletal:        General: Normal range of motion.     Cervical back: Normal range of motion.  Skin:    General: Skin is warm and dry.  Neurological:     Mental Status: She is alert and oriented to person, place, and time.    ED Results / Procedures / Treatments   Labs (all labs ordered are listed, but only abnormal results are displayed) Labs Reviewed  CBC WITH DIFFERENTIAL/PLATELET - Abnormal; Notable for the following components:      Result Value   RBC 5.32 (*)    Hemoglobin 11.9 (*)    MCV 75.6 (*)    MCH 22.4 (*)    MCHC 29.6 (*)    RDW 15.8 (*)    All other components within normal limits  COMPREHENSIVE METABOLIC PANEL - Abnormal; Notable for the following components:   Glucose, Bld 114 (*)    All other components within normal limits  URINALYSIS, ROUTINE W REFLEX MICROSCOPIC - Abnormal; Notable for the following components:   APPearance HAZY (*)    Protein, ur 30 (*)    Leukocytes,Ua MODERATE (*)    Bacteria, UA RARE (*)    All other components within normal limits  LIPASE, BLOOD  I-STAT BETA HCG BLOOD, ED ([facility], [facility], [facility] ONLY)    EKG None  Radiology No results found.  Procedures Procedures   Medications Ordered in ED Medications  famotidine (PEPCID) IVPB 20 mg premix (0 mg Intravenous Stopped 03/03/21 0332)  ondansetron (ZOFRAN) injection 4 mg (4 mg Intravenous Given 03/03/21 0233)  fentaNYL (SUBLIMAZE) injection 50 mcg (50 mcg Intravenous Given 03/03/21 0234)  sodium chloride 0.9 % bolus 1,000 mL (0 mLs Intravenous Stopped 03/03/21 0332)  alum & mag hydroxide-simeth (MAALOX/MYLANTA) 200-200-20 MG/5ML suspension 30 mL (30 mLs Oral Given 03/03/21 0359)    And  lidocaine (XYLOCAINE) 2 % viscous mouth solution 15 mL (15 mLs Oral Given  03/03/21 0359)    ED Course  I have reviewed the triage vital signs and the nursing notes.  Pertinent labs & imaging results that were available during my care of the patient were reviewed by me and considered in my medical decision making (see chart  for details).    MDM Rules/Calculators/A&P                           50 year old female presenting to the ED with epigastric pain.  This is been ongoing for the past few days.  States pain waxes and wanes in severity but never fully goes away.  She reports some nausea without vomiting.  Normal BM yesterday.  She is afebrile and nontoxic in appearance here.  Her abdomen is soft and nontender.  She denies any associated chest pain or shortness of breath.  Does have history of acid reflux, has not been on any medications for this in quite some time.  We will plan to obtain labs, given Pepcid, Zofran, gi cocktail.  6:27 AM Some improvement of symptoms here with meds and GI cocktail.  She is resting comfortably, no emesis here in the ED.  Work-up is reassuring.  I suspect this is likely GI related, possibly GERD.  It does appear she was recently prescribed monic on 02/22/21 which may be contributing.  Will start on daily PPI and have her follow-up with GI if not improving.  May return here for any new/acute changes.  Final Clinical Impression(s) / ED Diagnoses Final diagnoses:  Epigastric pain    Rx / DC Orders ED Discharge Orders          Ordered    omeprazole (PRILOSEC) 20 MG capsule  Daily        03/03/21 0530    ondansetron (ZOFRAN ODT) 4 MG disintegrating tablet  Every 8 hours PRN        03/03/21 0530             Larene Pickett, PA-C 03/03/21 [id number]    Fatima Blank, MD 03/03/21 [phone]

## 2021-03-03 NOTE — ED Triage Notes (Signed)
Patient is A&Ox4 ambulatory. Here for abdominal pain. Pain is in the center of the stomach. Pain has been for a couple of days.

## 2021-03-03 NOTE — ED Notes (Signed)
Lab contacted to process ordered bloodwork.

## 2021-03-03 NOTE — Discharge Instructions (Addendum)
All your blood work today was normal. The meloxicam (mobic) you are current taking can sometimes worsen acid reflux.  This may be causing some of your issues. Take the prescribed medication as directed. I would avoid alcohol, spicy/acidic foods, and extra NSAIDs (motrin, aleve, ibuprofen) Follow-up with your doctor.  I have also listed information for GI physician on call if not improving. Return to the ED for new or worsening symptoms.

## 2021-05-03 ENCOUNTER — Emergency Department (HOSPITAL_COMMUNITY)
Admission: EM | Admit: 2021-05-03 | Discharge: 2021-05-04 | Disposition: A | Discharge status: Home / Self Care | Payer: BLUE CROSS/BLUE SHIELD | Attending: Emergency Medicine | Admitting: Emergency Medicine

## 2021-05-03 ENCOUNTER — Encounter (HOSPITAL_COMMUNITY): Payer: Self-pay | Admitting: Emergency Medicine

## 2021-05-03 DIAGNOSIS — K921 Melena: Secondary | ICD-10-CM | POA: Insufficient documentation

## 2021-05-03 DIAGNOSIS — R1013 Epigastric pain: Secondary | ICD-10-CM | POA: Diagnosis present

## 2021-05-03 DIAGNOSIS — R195 Other fecal abnormalities: Secondary | ICD-10-CM

## 2021-05-03 DIAGNOSIS — D500 Iron deficiency anemia secondary to blood loss (chronic): Secondary | ICD-10-CM | POA: Insufficient documentation

## 2021-05-03 DIAGNOSIS — K219 Gastro-esophageal reflux disease without esophagitis: Secondary | ICD-10-CM | POA: Insufficient documentation

## 2021-05-03 LAB — CBC WITH DIFFERENTIAL/PLATELET
Abs Immature Granulocytes: 0.01 10*3/uL (ref 0.00–0.07)
Basophils Absolute: 0 10*3/uL (ref 0.0–0.1)
Basophils Relative: 1 %
Eosinophils Absolute: 0.1 10*3/uL (ref 0.0–0.5)
Eosinophils Relative: 2 %
HCT: 35.9 % — ABNORMAL LOW (ref 36.0–46.0)
Hemoglobin: 10.6 g/dL — ABNORMAL LOW (ref 12.0–15.0)
Immature Granulocytes: 0 %
Lymphocytes Relative: 45 %
Lymphs Abs: 2.7 10*3/uL (ref 0.7–4.0)
MCH: 22.3 pg — ABNORMAL LOW (ref 26.0–34.0)
MCHC: 29.5 g/dL — ABNORMAL LOW (ref 30.0–36.0)
MCV: 75.6 fL — ABNORMAL LOW (ref 80.0–100.0)
Monocytes Absolute: 0.5 10*3/uL (ref 0.1–1.0)
Monocytes Relative: 8 %
Neutro Abs: 2.7 10*3/uL (ref 1.7–7.7)
Neutrophils Relative %: 44 %
Platelets: 273 10*3/uL (ref 150–400)
RBC: 4.75 MIL/uL (ref 3.87–5.11)
RDW: 17.6 % — ABNORMAL HIGH (ref 11.5–15.5)
WBC: 6.1 10*3/uL (ref 4.0–10.5)
nRBC: 0 % (ref 0.0–0.2)

## 2021-05-03 MED ORDER — LIDOCAINE VISCOUS HCL 2 % MT SOLN
15.0000 mL | Freq: Once | OROMUCOSAL | Status: AC
  Administered 2021-05-03: 15 mL via ORAL
  Filled 2021-05-03: qty 15

## 2021-05-03 MED ORDER — ALUM & MAG HYDROXIDE-SIMETH 200-200-20 MG/5ML PO SUSP
30.0000 mL | Freq: Once | ORAL | Status: AC
  Administered 2021-05-03: 30 mL via ORAL
  Filled 2021-05-03: qty 30

## 2021-05-03 NOTE — ED Triage Notes (Signed)
Pt c/o epigastric pain x 3 days. Reports pain worsens with eating acidic foods, and improves with maalox.

## 2021-05-03 NOTE — ED Provider Notes (Signed)
[facility] DEPT Provider Note   CSN: [id number] Arrival date & time: 05/03/21  2223     History Chief Complaint  Patient presents with   Abdominal Pain    Karen Lutz is a 50 y.o. female with history of GERD, iron deficiency anemia, uterine fibroids s/p hysterectomy who presents the emergency department with a chief complaint of epigastric pain.  The patient reports a 2-3 history of epigastric pain.  The pain radiates to the umbilicus.  It does not radiate into the chest or to her back.  She characterizes the pain as sharp and stabbing.  Pain is not burning.  Pain has been constant, but has been waxing and waning in intensity since onset.  She has noticed that pain has been worse with eating and drinking certain substances, including soda.  No other known aggravating or alleviating factors including worsening with exertion. She does have an history of GERD and is not taking any preventative medications at home as she ran out of previous medications.  She states that symptoms feel similar to previous episodes of GERD, but significantly worse.   She also adds that her stools have been black.  Denies hematochezia.  She has not been taking Pepcid.  No history of GI bleed.  Reports social alcohol use on the weekends.  No recent increase in alcohol use.  Denies NSAID use.  She denies fever, chills, cough, shortness of breath, back pain, dysuria, hematuria, flank pain, nausea, vomiting, constipation, diarrhea, vaginal bleeding, vaginal discharge.  Patient had a total abdominal hysterectomy and April 2022.  She denies any complications with the procedure.  The history is provided by the patient and medical records. No language interpreter was used.      Past Medical History:  Diagnosis Date   Acid reflux    Anemia    Fibroids    Heart murmur    UTI (lower urinary tract infection)     Patient Active Problem List   Diagnosis Date Noted   Visit for  wound check 10/03/2020   OA (osteoarthritis) of knee 05/09/2020   Epigastric pain 07/03/2013   Anemia 07/03/2013   Migraine 07/03/2013   Mood disorder ([place]) 02/15/2011   Iron deficiency anemia 02/15/2011   Uterine fibroid 02/15/2011   GERD (gastroesophageal reflux disease) 02/15/2011    Past Surgical History:  Procedure Laterality Date   ceaserian     CESAREAN SECTION     CESAREAN SECTION     KNEE SURGERY     KNEE SURGERY       OB History   No obstetric history on file.     Family History  Problem Relation Age of Onset   Hypertension Other    Cancer Other    Alcohol abuse Mother    Heart disease Mother    Alcohol abuse Father    Heart disease Father    Arthritis Maternal Grandmother    Arthritis Maternal Grandfather    Arthritis Paternal Grandmother    Arthritis Paternal Grandfather     Social History   Tobacco Use   Smoking status: Never   Smokeless tobacco: Never  Vaping Use   Vaping Use: Never used  Substance Use Topics   Alcohol use: No   Drug use: No    Home Medications Prior to Admission medications   Medication Sig Start Date End Date Taking? Authorizing Provider  lidocaine (XYLOCAINE) 2 % solution Use as directed 15 mLs in the mouth or throat every 3 (three) hours  as needed for mouth pain. 05/04/21  Yes Laria Grimmett A, PA-C  meloxicam (MOBIC) 7.5 MG tablet Take 1 tablet (7.5 mg total) by mouth 2 (two) times daily as needed for pain. 02/22/21  Yes Rosemarie Ax, MD  methocarbamol (ROBAXIN) 500 MG tablet Take 1 tablet (500 mg total) by mouth 2 (two) times daily. 12/30/20  Yes Couture, Cortni S, PA-C  ondansetron (ZOFRAN ODT) 4 MG disintegrating tablet Take 1 tablet (4 mg total) by mouth every 8 (eight) hours as needed for nausea. 03/03/21  Yes Larene Pickett, PA-C  pantoprazole (PROTONIX) 40 MG tablet Take 1 tablet (40 mg total) by mouth daily. 05/04/21 06/03/21 Yes Dexter Sauser A, PA-C  sucralfate (CARAFATE) 1 g tablet Take 1 tablet (1 g total) by  mouth 4 (four) times daily. 05/04/21 06/03/21 Yes Donta Mcinroy A, PA-C  albuterol (VENTOLIN HFA) 108 (90 Base) MCG/ACT inhaler Inhale 2 puffs into the lungs every 4 (four) hours as needed for wheezing or shortness of breath. Patient not taking: Reported on 10/30/2020 09/08/20 11/24/20  Hans Eden, NP    Allergies    Penicillins and Penicillin g  Review of Systems   Review of Systems  Constitutional:  Negative for activity change, chills, diaphoresis and fever.  HENT:  Negative for congestion and sore throat.   Eyes:  Negative for visual disturbance.  Respiratory:  Negative for cough, choking, shortness of breath and wheezing.   Cardiovascular:  Negative for chest pain and palpitations.  Gastrointestinal:  Positive for abdominal pain. Negative for constipation, diarrhea, nausea and vomiting.       +belching +flatus  Genitourinary:  Negative for dysuria.  Musculoskeletal:  Negative for back pain and myalgias.  Skin:  Negative for rash.  Allergic/Immunologic: Negative for immunocompromised state.  Neurological:  Negative for seizures, syncope, weakness, numbness and headaches.  Psychiatric/Behavioral:  Negative for confusion.    Physical Exam Updated Vital Signs BP 130/63   Pulse 95   Temp 98.2 F (36.8 C) (Oral)   Resp 18   Ht 5\' 7"  (1.702 m)   Wt 133.8 kg   LMP  (LMP Unknown)   SpO2 97%   BMI 46.20 kg/m   Physical Exam Vitals and nursing note reviewed.  Constitutional:      General: She is not in acute distress.    Appearance: She is not ill-appearing, toxic-appearing or diaphoretic.  HENT:     Head: Normocephalic.  Eyes:     Conjunctiva/sclera: Conjunctivae normal.  Cardiovascular:     Rate and Rhythm: Normal rate and regular rhythm.     Pulses: Normal pulses.     Heart sounds: Normal heart sounds. No murmur heard.   No friction rub. No gallop.  Pulmonary:     Effort: Pulmonary effort is normal. No respiratory distress.     Breath sounds: No stridor. No  wheezing, rhonchi or rales.  Chest:     Chest wall: No tenderness.  Abdominal:     General: There is no distension.     Palpations: Abdomen is soft. There is no mass.     Tenderness: There is abdominal tenderness. There is no right CVA tenderness, left CVA tenderness, guarding or rebound.     Hernia: No hernia is present.     Comments: Tender palpation in the epigastric region right upper quadrant.  Negative Murphy sign.  No peritoneal signs.  Normoactive bowel sounds.  No CVA tenderness bilaterally.  Musculoskeletal:        General: No tenderness.  Cervical back: Neck supple.     Right lower leg: No edema.     Left lower leg: No edema.  Skin:    General: Skin is warm.     Findings: No rash.  Neurological:     Mental Status: She is alert.  Psychiatric:        Behavior: Behavior normal.    ED Results / Procedures / Treatments   Labs (all labs ordered are listed, but only abnormal results are displayed) Labs Reviewed  CBC WITH DIFFERENTIAL/PLATELET - Abnormal; Notable for the following components:      Result Value   Hemoglobin 10.6 (*)    HCT 35.9 (*)    MCV 75.6 (*)    MCH 22.3 (*)    MCHC 29.5 (*)    RDW 17.6 (*)    All other components within normal limits  COMPREHENSIVE METABOLIC PANEL - Abnormal; Notable for the following components:   Creatinine, Ser 0.43 (*)    AST 14 (*)    All other components within normal limits  POC OCCULT BLOOD, ED - Abnormal; Notable for the following components:   Fecal Occult Bld POSITIVE (*)    All other components within normal limits  LIPASE, BLOOD    EKG None  Radiology No results found.  Procedures Procedures   Medications Ordered in ED Medications  sucralfate (CARAFATE) tablet 1 g (has no administration in time range)  pantoprazole (PROTONIX) EC tablet 40 mg (has no administration in time range)  alum & mag hydroxide-simeth (MAALOX/MYLANTA) 200-200-20 MG/5ML suspension 30 mL (30 mLs Oral Given 05/03/21 2331)    And   lidocaine (XYLOCAINE) 2 % viscous mouth solution 15 mL (15 mLs Oral Given 05/03/21 2331)  fentaNYL (SUBLIMAZE) injection 100 mcg (100 mcg Intravenous Given 05/04/21 0025)  ondansetron (ZOFRAN) injection 4 mg (4 mg Intravenous Given 05/04/21 0024)  sodium chloride 0.9 % bolus 1,000 mL (1,000 mLs Intravenous New Bag/Given 05/04/21 0027)  famotidine (PEPCID) IVPB 20 mg premix (0 mg Intravenous Stopped 05/04/21 0103)    ED Course  I have reviewed the triage vital signs and the nursing notes.  Pertinent labs & imaging results that were available during my care of the patient were reviewed by me and considered in my medical decision making (see chart for details).  Clinical Course as of 05/04/21 0236  Thu May 04, 2021  0144 Patient rechecked.  She reports that abdominal pain has resolved.  She reports that she has been having black stool for the last few weeks.  She has also noticed a small amount of bright red blood in her stool.  No clots.  She adamantly denies dizziness or lightheadedness.  She has an initial consult with gastroenterology on October 10.  No family history of colorectal cancer. [MM]  0145 Discussed rectal exam at bedside.  Exam chaperoned by staff.  Normal rectal tone.  Patient is agreeable to exam. Minimal sample was able to be obtained on exam. [MM]    Clinical Course User Index [MM] Sonika Levins, Laymond Purser, PA-C   MDM Rules/Calculators/A&P                           50 year old female with history of GERD, iron deficiency anemia, uterine fibroids s/p hysterectomy who presents emergency department with epigastric abdominal pain.  Patient has a history of GERD, states that symptoms feel similar, but significantly worse than previous episodes.  She reports associated belching, flatus, and also notes that her  stools been black over the last few weeks.  She later adds that she has noticed some bright red blood in her stool, but no clots.  She has an initial consult with GI on October  10.  Minimally tachycardic on arrival, but this resolved with fluids.  Vital signs are otherwise unremarkable.  On exam, she has tenderness palpation in the right upper quadrant and epigastric region without peritoneal signs.  Differential diagnosis includes GERD, peptic or gastric ulcer disease, pancreatitis, cholecystitis, ACS, GI bleed, bowel obstruction, or gastritis.  Patient does report a family history of cardiovascular disease, but symptoms do not sound consistent with ACS.  She is having no exertional symptoms reports that symptoms feel similar to previous episodes of GERD.  Symptoms also significantly improved with GI cocktail, famotidine.  Labs and imaging of been reviewed and independently interpreted by me.  She has a microcytic anemia, consistent with iron deficiency anemia.  Stool is Hemoccult positive.  I suspect that she may have a peptic or gastric ulcer, but her hemoglobin actually appears improved from July after her hysterectomy.  Sounds as if she may have mild, chronic bleeding.  I do not think that she has a acutely perforated ulcer.  I also reviewed her previous work-up from her last ER visit and that she had a normal right upper quadrant ultrasound without any evidence of Coley lithiasis.  CT scan was also reassuring at that time.  Initially, patient stated that she was not using any NSAIDs, but upon further review it sounds as if she has been intermittently using meloxicam for right knee pain, which she was advised to stop.  She is also advised to stop using alcohol intermittently.  On reevaluation, she reports significant improvement in symptoms from arrival.  I think given her stable symptoms today, that it is reasonable to discharge the patient to home with outpatient follow-up for her gastroenterology appointment in 11 days.  She will be discharged with pantoprazole, Carafate, and viscous lidocaine.  Home supportive care has also been discussed.  I have also advised the  patient to start on iron supplementation for iron deficiency anemia.  She was given strict ER return precautions to return for worsening symptoms.  Safer discharge home with outpatient follow-up as discussed.  Final Clinical Impression(s) / ED Diagnoses Final diagnoses:  Epigastric pain  Heme positive stool  Iron deficiency anemia due to chronic blood loss    Rx / DC Orders ED Discharge Orders          Ordered    pantoprazole (PROTONIX) 40 MG tablet  Daily        05/04/21 0222    sucralfate (CARAFATE) 1 g tablet  4 times daily        05/04/21 0222    lidocaine (XYLOCAINE) 2 % solution  Every  3 hours PRN        05/04/21 0222             Jarren Para A, PA-C 05/04/21 0236    Mesner, Corene Cornea, MD 05/04/21 [phone]

## 2021-05-04 LAB — COMPREHENSIVE METABOLIC PANEL
ALT: 7 U/L (ref 0–44)
AST: 14 U/L — ABNORMAL LOW (ref 15–41)
Albumin: 3.6 g/dL (ref 3.5–5.0)
Alkaline Phosphatase: 85 U/L (ref 38–126)
Anion gap: 8 (ref 5–15)
BUN: 12 mg/dL (ref 6–20)
CO2: 27 mmol/L (ref 22–32)
Calcium: 9.8 mg/dL (ref 8.9–10.3)
Chloride: 104 mmol/L (ref 98–111)
Creatinine, Ser: 0.43 mg/dL — ABNORMAL LOW (ref 0.44–1.00)
GFR, Estimated: >60 mL/min (ref >60)
Glucose, Bld: 89 mg/dL (ref 70–99)
Potassium: 4.1 mmol/L (ref 3.5–5.1)
Sodium: 139 mmol/L (ref 135–145)
Total Bilirubin: 0.4 mg/dL (ref 0.3–1.2)
Total Protein: 7.3 g/dL (ref 6.5–8.1)

## 2021-05-04 LAB — LIPASE, BLOOD: Lipase: 49 U/L (ref 11–51)

## 2021-05-04 LAB — POC OCCULT BLOOD, ED: Fecal Occult Bld: POSITIVE — AB

## 2021-05-04 MED ORDER — PANTOPRAZOLE SODIUM 40 MG PO TBEC: 40.0000 mg | DELAYED_RELEASE_TABLET | Freq: Every day | ORAL | 0 refills | Status: DC

## 2021-05-04 MED ORDER — SODIUM CHLORIDE 0.9 % IV BOLUS
1000.0000 mL | Freq: Once | INTRAVENOUS | Status: AC
  Administered 2021-05-04: 1000 mL via INTRAVENOUS

## 2021-05-04 MED ORDER — PANTOPRAZOLE SODIUM 40 MG PO TBEC
40.0000 mg | DELAYED_RELEASE_TABLET | Freq: Once | ORAL | Status: AC
  Administered 2021-05-04: 40 mg via ORAL
  Filled 2021-05-04: qty 1

## 2021-05-04 MED ORDER — LIDOCAINE VISCOUS HCL 2 % MT SOLN: 15.0000 mL | OROMUCOSAL | 0 refills | Status: DC | PRN

## 2021-05-04 MED ORDER — SUCRALFATE 1 G PO TABS
1.0000 g | ORAL_TABLET | Freq: Once | ORAL | Status: AC
  Administered 2021-05-04: 1 g via ORAL
  Filled 2021-05-04: qty 1

## 2021-05-04 MED ORDER — FAMOTIDINE IN NACL 20-0.9 MG/50ML-% IV SOLN
20.0000 mg | Freq: Once | INTRAVENOUS | Status: AC
  Administered 2021-05-04: 20 mg via INTRAVENOUS
  Filled 2021-05-04: qty 50

## 2021-05-04 MED ORDER — FENTANYL CITRATE PF 50 MCG/ML IJ SOSY
100.0000 ug | PREFILLED_SYRINGE | Freq: Once | INTRAMUSCULAR | Status: AC
  Administered 2021-05-04: 100 ug via INTRAVENOUS
  Filled 2021-05-04: qty 2

## 2021-05-04 MED ORDER — ONDANSETRON HCL 4 MG/2ML IJ SOLN
4.0000 mg | Freq: Once | INTRAMUSCULAR | Status: AC
  Administered 2021-05-04: 4 mg via INTRAVENOUS
  Filled 2021-05-04: qty 2

## 2021-05-04 MED ORDER — SUCRALFATE 1 G PO TABS: 1.0000 g | ORAL_TABLET | Freq: Four times a day (QID) | ORAL | 0 refills | Status: DC

## 2021-05-04 NOTE — Discharge Instructions (Addendum)
Thank you for allowing me to care for you today in the Emergency Department.   I am suspicious that you might have an ulcer that is contributing to your symptoms.  Your stool today was positive for blood, but since your other labs are otherwise stable, I think it is reasonable for you to follow-up with your gastroenterologist appointment on October 10 as long as you are symptoms do not worsen.  To manage your symptoms at home:  -Take 1 tablet of pantoprazole daily for the next 4 weeks.  Your first dose was given tonight in the emergency department. -Take 1 tablet of Carafate every 6 hours for the next month.  Your first dose was given tonight in the emergency department. -You can swallow 15 mL of viscous lidocaine at no more than once every 3 hours for pain.  I would start taking a ferrous sulfate supplement (iron).  These are available over-the-counter and can be picked up at the pharmacy.  Take 1 tablet every other day with or without food (directions regarding food should be on the label.)  You should avoid all NSAID medications, which includes meloxicam, ibuprofen, Aleve, naproxen, Motrin.  I have included an eating plan at that may help to improve your pain by avoiding certain foods.  I would also avoid alcohol as this may make your symptoms worse.  Return to the emergency department if you have black or bloody vomiting, if you start passing large clots of blood in your bowel movements, if you pass out, if you develop respiratory distress, if you start having worsening pain with a fever, temperature of 100.4 F or higher, or other new, concerning symptoms.

## 2021-06-15 ENCOUNTER — Ambulatory Visit (INDEPENDENT_AMBULATORY_CARE_PROVIDER_SITE_OTHER): Payer: BLUE CROSS/BLUE SHIELD | Admitting: Orthopedic Surgery

## 2021-06-15 ENCOUNTER — Encounter (HOSPITAL_COMMUNITY): Payer: Self-pay | Admitting: Oncology

## 2021-06-15 ENCOUNTER — Emergency Department (HOSPITAL_COMMUNITY): Payer: BLUE CROSS/BLUE SHIELD

## 2021-06-15 ENCOUNTER — Encounter: Payer: Self-pay | Admitting: Orthopedic Surgery

## 2021-06-15 ENCOUNTER — Other Ambulatory Visit: Payer: Self-pay

## 2021-06-15 ENCOUNTER — Emergency Department (HOSPITAL_COMMUNITY)
Admission: EM | Admit: 2021-06-15 | Discharge: 2021-06-15 | Disposition: A | Discharge status: Home / Self Care | Payer: BLUE CROSS/BLUE SHIELD | Attending: Emergency Medicine | Admitting: Emergency Medicine

## 2021-06-15 DIAGNOSIS — T1490XA Injury, unspecified, initial encounter: Secondary | ICD-10-CM

## 2021-06-15 DIAGNOSIS — W06XXXA Fall from bed, initial encounter: Secondary | ICD-10-CM | POA: Diagnosis not present

## 2021-06-15 DIAGNOSIS — S93324A Dislocation of tarsometatarsal joint of right foot, initial encounter: Secondary | ICD-10-CM

## 2021-06-15 DIAGNOSIS — S92324A Nondisplaced fracture of second metatarsal bone, right foot, initial encounter for closed fracture: Secondary | ICD-10-CM | POA: Insufficient documentation

## 2021-06-15 DIAGNOSIS — M79671 Pain in right foot: Secondary | ICD-10-CM | POA: Diagnosis not present

## 2021-06-15 DIAGNOSIS — S92334A Nondisplaced fracture of third metatarsal bone, right foot, initial encounter for closed fracture: Secondary | ICD-10-CM | POA: Insufficient documentation

## 2021-06-15 DIAGNOSIS — S99921A Unspecified injury of right foot, initial encounter: Secondary | ICD-10-CM | POA: Diagnosis present

## 2021-06-15 DIAGNOSIS — S92301A Fracture of unspecified metatarsal bone(s), right foot, initial encounter for closed fracture: Secondary | ICD-10-CM

## 2021-06-15 MED ORDER — OXYCODONE-ACETAMINOPHEN 5-325 MG PO TABS: 1.0000 | ORAL_TABLET | Freq: Four times a day (QID) | ORAL | 0 refills | Status: AC | PRN

## 2021-06-15 NOTE — ED Notes (Signed)
Pt states understanding of dc instructions, follow up and prescription. Pt assisted to ed lobby w/ ed tech via wheelchair. Pt states understanding of foot fracture care and use of crutches and post op shoe. Pt denies questions or concerns upon dc.

## 2021-06-15 NOTE — ED Notes (Signed)
Pt returned to room via stretcher w/ xray tech.

## 2021-06-15 NOTE — ED Notes (Addendum)
Patient transported to X-ray by xray tech via Biomedical scientist

## 2021-06-15 NOTE — Discharge Instructions (Addendum)
We discussed the results of your x-ray findings on today's visit.  You will need to continue wearing the postop shoe until you follow-up with orthopedics.  You were given a prescription for pain medication, please take this only for severe pain.  In addition you may take some anti-inflammatory such as Aleve to help with swelling along with apply ice and elevate your foot.

## 2021-06-15 NOTE — ED Provider Notes (Signed)
[facility] DEPT Provider Note   CSN: [id number] Arrival date & time: 06/15/21  0754     History Chief Complaint  Patient presents with   Foot Pain    JENI DULING is a 50 y.o. female.  50 y.o female with a PMH of Migraine presents to the ED status post mechanical fall.  Patient reports waking up this morning, sitting at the edge of her bed, when she suddenly fell backwards, heard a popping sensation to her right foot.  She reports pain along the dorsum aspect of it, exacerbated with plantarflexion along with weightbearing.  She has not taken any medication for improvement in symptoms.  There is no alleviating factors.  Does report ongoing history of chronic right knee pain however reports the pain is more severe in her right foot today.  Denies any headache, denies any loss of consciousness.  No other complaints.  The history is provided by the patient and medical records.  Foot Pain      Past Medical History:  Diagnosis Date   Acid reflux    Anemia    Fibroids    Heart murmur    UTI (lower urinary tract infection)     Patient Active Problem List   Diagnosis Date Noted   Visit for wound check 10/03/2020   OA (osteoarthritis) of knee 05/09/2020   Epigastric pain 07/03/2013   Anemia 07/03/2013   Migraine 07/03/2013   Mood disorder ([place]) 02/15/2011   Iron deficiency anemia 02/15/2011   Uterine fibroid 02/15/2011   GERD (gastroesophageal reflux disease) 02/15/2011    Past Surgical History:  Procedure Laterality Date   ceaserian     CESAREAN SECTION     CESAREAN SECTION     KNEE SURGERY     KNEE SURGERY       OB History   No obstetric history on file.     Family History  Problem Relation Age of Onset   Hypertension Other    Cancer Other    Alcohol abuse Mother    Heart disease Mother    Alcohol abuse Father    Heart disease Father    Arthritis Maternal Grandmother    Arthritis Maternal Grandfather    Arthritis  Paternal Grandmother    Arthritis Paternal Grandfather     Social History   Tobacco Use   Smoking status: Never   Smokeless tobacco: Never  Vaping Use   Vaping Use: Never used  Substance Use Topics   Alcohol use: No   Drug use: No    Home Medications Prior to Admission medications   Medication Sig Start Date End Date Taking? Authorizing Provider  oxyCODONE-acetaminophen (PERCOCET/ROXICET) 5-325 MG tablet Take 1 tablet by mouth every 6 (six) hours as needed for up to 3 days for severe pain. 06/15/21 06/18/21 Yes Bryant Lipps, PA-C  lidocaine (XYLOCAINE) 2 % solution Use as directed 15 mLs in the mouth or throat every 3 (three) hours as needed for mouth pain. 05/04/21   McDonald, Mia A, PA-C  meloxicam (MOBIC) 7.5 MG tablet Take 1 tablet (7.5 mg total) by mouth 2 (two) times daily as needed for pain. 02/22/21   Rosemarie Ax, MD  methocarbamol (ROBAXIN) 500 MG tablet Take 1 tablet (500 mg total) by mouth 2 (two) times daily. 12/30/20   Couture, Cortni S, PA-C  ondansetron (ZOFRAN ODT) 4 MG disintegrating tablet Take 1 tablet (4 mg total) by mouth every 8 (eight) hours as needed for nausea. 03/03/21   Baird Cancer,  Vilinda Blanks, PA-C  pantoprazole (PROTONIX) 40 MG tablet Take 1 tablet (40 mg total) by mouth daily. 05/04/21 06/03/21  McDonald, Mia A, PA-C  sucralfate (CARAFATE) 1 g tablet Take 1 tablet (1 g total) by mouth 4 (four) times daily. 05/04/21 06/03/21  McDonald, Mia A, PA-C  albuterol (VENTOLIN HFA) 108 (90 Base) MCG/ACT inhaler Inhale 2 puffs into the lungs every 4 (four) hours as needed for wheezing or shortness of breath. Patient not taking: Reported on 10/30/2020 09/08/20 11/24/20  Hans Eden, NP    Allergies    Penicillins and Penicillin g  Review of Systems   Review of Systems  Constitutional:  Negative for fever.  Musculoskeletal:  Positive for arthralgias.   Physical Exam Updated Vital Signs BP 134/83   Pulse 96   Temp 98.4 F (36.9 C) (Oral)   Resp 18   Ht 5\' 6"   (1.676 m)   Wt 133.8 kg   LMP  (LMP Unknown)   SpO2 100%   BMI 47.61 kg/m   Physical Exam Vitals and nursing note reviewed.  Constitutional:      Appearance: Normal appearance.  HENT:     Head: Normocephalic and atraumatic.     Mouth/Throat:     Mouth: Mucous membranes are moist.  Eyes:     Pupils: Pupils are equal, round, and reactive to light.  Cardiovascular:     Rate and Rhythm: Normal rate.     Pulses:          Dorsalis pedis pulses are 2+ on the right side and 2+ on the left side.       Posterior tibial pulses are 2+ on the right side and 2+ on the left side.  Pulmonary:     Effort: Pulmonary effort is normal.  Abdominal:     General: Abdomen is flat.  Musculoskeletal:        General: Swelling, tenderness and signs of injury present.  Feet:     Right foot:     Toenail Condition: Right toenails are abnormally thick.     Left foot:     Toenail Condition: Left toenails are abnormally thick.     Comments: Swelling noted to the dorsum aspect of the right foot, with likely a small fluid collection.  No signs of infection.  No signs of open fracture or injury.  No pain along the lateral and medial malleolus.  Worsened pain with plantarflexion. Skin:    General: Skin is warm and dry.  Neurological:     Mental Status: She is alert and oriented to person, place, and time.    ED Results / Procedures / Treatments   Labs (all labs ordered are listed, but only abnormal results are displayed) Labs Reviewed - No data to display  EKG None  Radiology DG Foot Complete Right  Result Date: 06/15/2021 CLINICAL DATA:  [place] at home with dorsal pain EXAM: RIGHT FOOT COMPLETE - 3+ VIEW COMPARISON:  None. FINDINGS: There are fractures of the proximal second and third metatarsals. Uncertain injury of the proximal fourth metatarsal as well. No evidence of displacement or angulation on the lateral view. Other structures of the foot appear normal except for mild hallux valgus deformity of  the great toe. IMPRESSION: Fractures of the proximal metatarsals of the second and third toes and possibly of the fourth toe. Electronically Signed   By: Nelson Chimes M.D.   On: 06/15/2021 08:52    Procedures Procedures   Medications Ordered in ED Medications - No  data to display  ED Course  I have reviewed the triage vital signs and the nursing notes.  Pertinent labs & imaging results that were available during my care of the patient were reviewed by me and considered in my medical decision making (see chart for details).    MDM Rules/Calculators/A&P   Patient presents to the ED status post mechanical fall several hours ago, reporting a 10 out of 10 pain to the right dorsum aspect of her foot after hearing a popping sensation.  Has not taken any medication for improvement in symptoms.  Evaluation there is swelling noted to the dorsum aspect, along the second and third proximal aspect of the foot.  Pulses are present, capillary refill is intact along with sensation throughout.  There is a visible noted effusion.  X-ray obtained on today's visit showed fractures of the proximal second and third metatarsals.  These results were discussed with patient week.  She is currently not weightbearing, I did discuss with her postop shoe, along with crutches and follow-up with outpatient orthopedics.  Patient is aware she will need to follow-up with Dr. Sharol Given for likely repair of her Lisfranc injury.  Given Norco for pain control, also recommended NSAIDs at home.  Along with RICE therapy.  She is agreeable of plan and management.  We will also discharge with crutches.  Patient is agreeable of plan and treatment and stable for discharge.   Portions of this note were generated with [employer]. Dictation errors may occur despite best attempts at proofreading.  Final Clinical Impression(s) / ED Diagnoses Final diagnoses:  Injury  Right foot pain  Closed nondisplaced fracture of metatarsal  bone of right foot, unspecified metatarsal, initial encounter    Rx / DC Orders ED Discharge Orders          Ordered    oxyCODONE-acetaminophen (PERCOCET/ROXICET) 5-325 MG tablet  Every 6 hours PRN        06/15/21 0913             Janeece Fitting, PA-C 06/15/21 2094    Sherwood Gambler, MD 06/15/21 [phone]

## 2021-06-15 NOTE — Progress Notes (Signed)
Office Visit Note   Patient: Karen Lutz           Date of Birth: [birth year]           MRN: [id number] Visit Date: 06/15/2021              Requested by: Jola Baptist, PA-C [id number],  [place] [id number] PCP: Jola Baptist, PA-C  Chief Complaint  Patient presents with   Right Foot - Fracture    Went to ED 06/15/21 due to fall      HPI: Patient is a 50 year old woman who is seen for initial evaluation for Lisfranc fracture right foot.  Patient states she injured her foot this morning getting into or out of bed.  She went to the emergency room radiographs were obtained and patient is seen today for initial evaluation.  Patient denies history of diabetes or sleep apnea.  Assessment & Plan: Visit Diagnoses:  1. Lisfranc dislocation, right, initial encounter     Plan: Was originally planning on performing a open reduction and fusion across the Lisfranc complex tomorrow.  In review of her insurance she has [place] through [facility] and will need to be seen and treated at [facility].  We are out of network.  I am available if she has any questions or problems or anything I can help her with.  Follow-Up Instructions: Return if symptoms worsen or fail to improve.   Ortho Exam  Patient is alert, oriented, no adenopathy, well-dressed, normal affect, normal respiratory effort. Examination patient has a good dorsalis pedis pulse she has swelling across the midfoot.  The Achilles tendon is intact nontender.  She has pain across the Lisfranc complex there is minimal pain across the base of the fifth metatarsal.  Review of the radiographs shows a Lisfranc fracture through the base of the second and third and fourth metatarsals with widening of the Lisfranc complex.  The skin is intact no bruising.  There is swelling but no compartment syndrome.  Imaging: DG Foot Complete Right  Result Date: 06/15/2021 CLINICAL DATA:  [place] at home with dorsal pain EXAM:  RIGHT FOOT COMPLETE - 3+ VIEW COMPARISON:  None. FINDINGS: There are fractures of the proximal second and third metatarsals. Uncertain injury of the proximal fourth metatarsal as well. No evidence of displacement or angulation on the lateral view. Other structures of the foot appear normal except for mild hallux valgus deformity of the great toe. IMPRESSION: Fractures of the proximal metatarsals of the second and third toes and possibly of the fourth toe. Electronically Signed   By: Nelson Chimes M.D.   On: 06/15/2021 08:52   No images are attached to the encounter.  Labs: Lab Results  Component Value Date   HGBA1C 5.6 02/06/2011   REPTSTATUS 12/19/2020 FINAL 12/18/2020   CULT (A) 12/18/2020    <10,000 COLONIES/mL INSIGNIFICANT GROWTH Performed at [facility], [place] [address] [place], [place] [address]      Lab Results  Component Value Date   ALBUMIN 3.6 05/03/2021   ALBUMIN 3.8 03/03/2021   ALBUMIN 3.7 01/01/2021    Lab Results  Component Value Date   MG 2.0 02/06/2011   No results found for: VD25OH  No results found for: PREALBUMIN CBC EXTENDED Latest Ref Rng & Units 05/03/2021 03/03/2021 01/01/2021  WBC 4.0 - 10.5 K/uL 6.1 4.3 6.4  RBC 3.87 - 5.11 MIL/uL 4.75 5.32(H) 4.10  HGB 12.0 - 15.0 g/dL 10.6(L) 11.9(L)  9.4(L)  HCT 36.0 - 46.0 % 35.9(L) 40.2 31.6(L)  PLT 150 - 400 K/uL 273 266 285  NEUTROABS 1.7 - 7.7 K/uL 2.7 1.7 4.0  LYMPHSABS 0.7 - 4.0 K/uL 2.7 1.9 2.0     There is no height or weight on file to calculate BMI.  Orders:  No orders of the defined types were placed in this encounter.  No orders of the defined types were placed in this encounter.    Procedures: No procedures performed  Clinical Data: No additional findings.  ROS:  All other systems negative, except as noted in the HPI. Review of Systems  Objective: Vital Signs: LMP  (LMP Unknown)   Specialty Comments:  No specialty comments available.  PMFS History: Patient Active Problem  List   Diagnosis Date Noted   Visit for wound check 10/03/2020   OA (osteoarthritis) of knee 05/09/2020   Epigastric pain 07/03/2013   Anemia 07/03/2013   Migraine 07/03/2013   Mood disorder ([place]) 02/15/2011   Iron deficiency anemia 02/15/2011   Uterine fibroid 02/15/2011   GERD (gastroesophageal reflux disease) 02/15/2011   Past Medical History:  Diagnosis Date   Acid reflux    Anemia    Fibroids    Heart murmur    UTI (lower urinary tract infection)     Family History  Problem Relation Age of Onset   Hypertension Other    Cancer Other    Alcohol abuse Mother    Heart disease Mother    Alcohol abuse Father    Heart disease Father    Arthritis Maternal Grandmother    Arthritis Maternal Grandfather    Arthritis Paternal Grandmother    Arthritis Paternal Grandfather     Past Surgical History:  Procedure Laterality Date   ceaserian     CESAREAN SECTION     CESAREAN SECTION     KNEE SURGERY     KNEE SURGERY     Social History   Occupational History   Not on file  Tobacco Use   Smoking status: Never   Smokeless tobacco: Never  Vaping Use   Vaping Use: Never used  Substance and Sexual Activity   Alcohol use: No   Drug use: No   Sexual activity: Not on file

## 2021-06-15 NOTE — ED Triage Notes (Signed)
Pt reports mechanical fall resulting in injury to right foot.  Pt states she heard a, "Pop."  Pt rates pain 10/10.  States cannot put weight on it.

## 2021-10-02 IMAGING — CT CT ABD-PELV W/O CM
2 of 4 series · 16 of 46 positions shown, 18 images · non-contrast
Comparison: Ultrasound 01/01/2021, CT 10/30/2020, 12/26/2018

CLINICAL DATA: Epigastric and umbilical pain

EXAM:
CT ABDOMEN AND PELVIS WITHOUT CONTRAST
TECHNIQUE: Multidetector CT imaging of the abdomen and pelvis was performed
following the standard protocol without IV contrast.

[Series 2: axial st · axial · 0.98mm/px · z∈[-440,-65]mm · 13 of 85 slices shown, 15 images]
[im 5/85  soft-tissue]
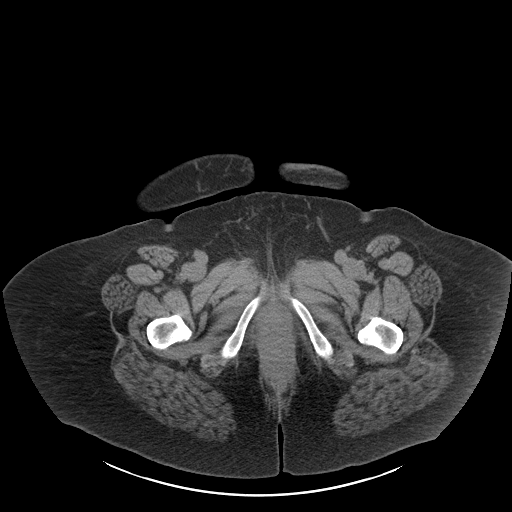
[im 5/85  bone]
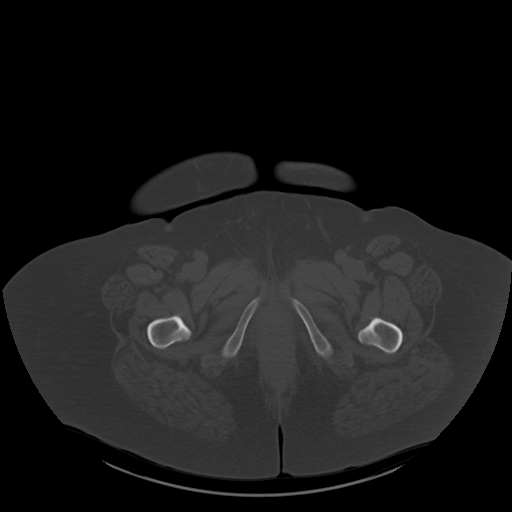
[im 14/85  soft-tissue]
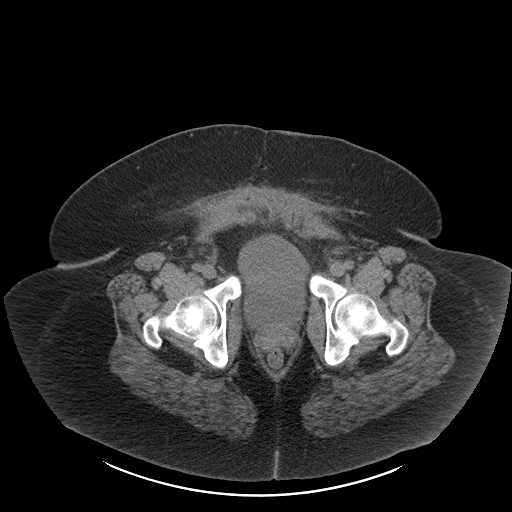
[im 18/85  soft-tissue]
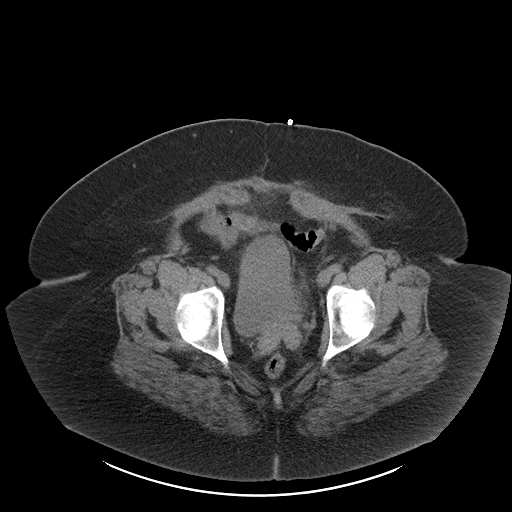
[im 23/85  soft-tissue]
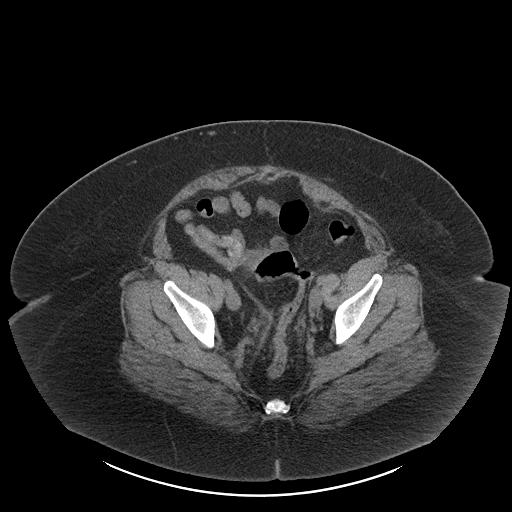
[im 31/85  soft-tissue]
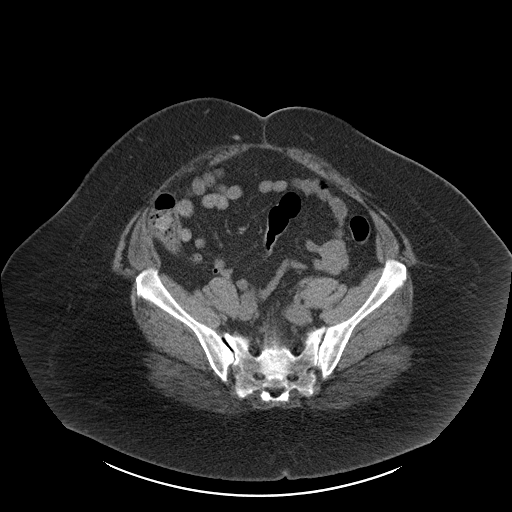
[im 36/85  soft-tissue]
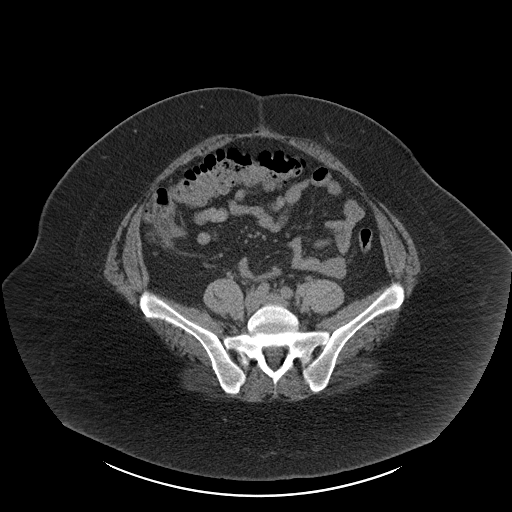
[im 45/85  soft-tissue]
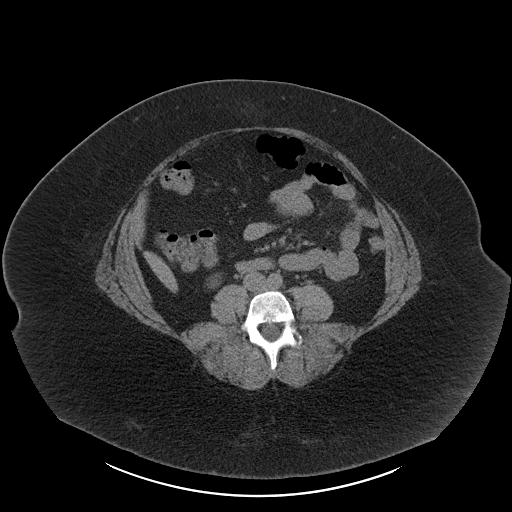
[im 49/85  soft-tissue]
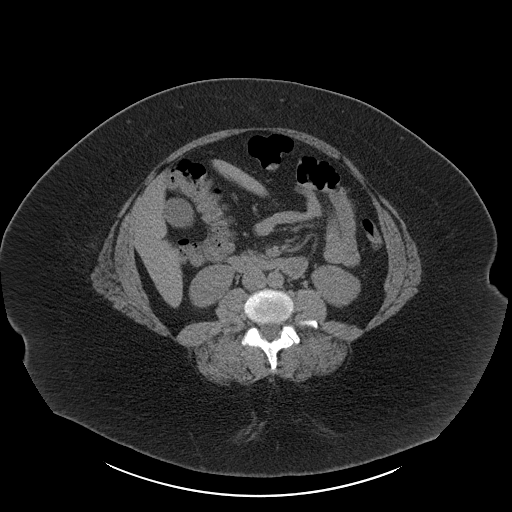
[im 54/85  soft-tissue]
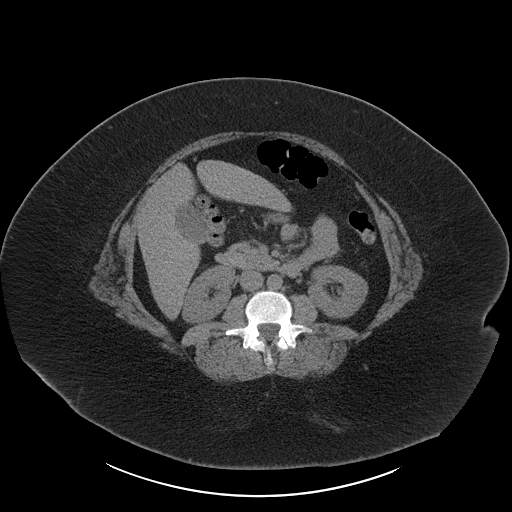
[im 54/85  bone]
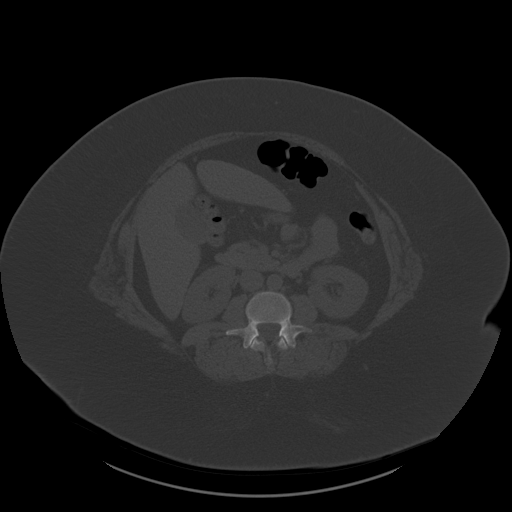
[im 62/85  soft-tissue]
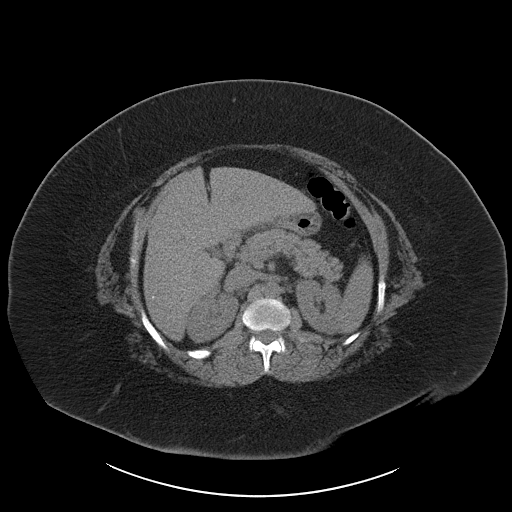
[im 67/85  soft-tissue]
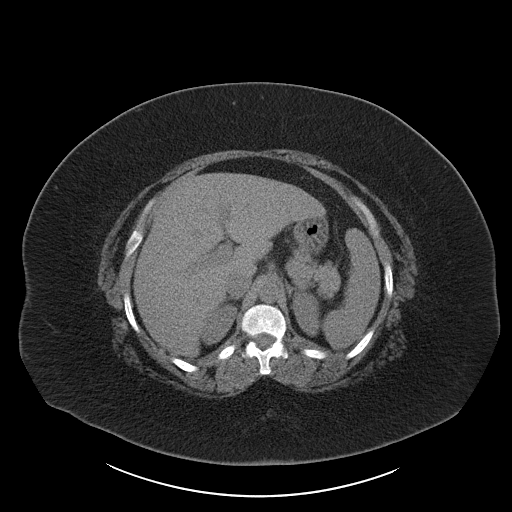
[im 71/85  soft-tissue]
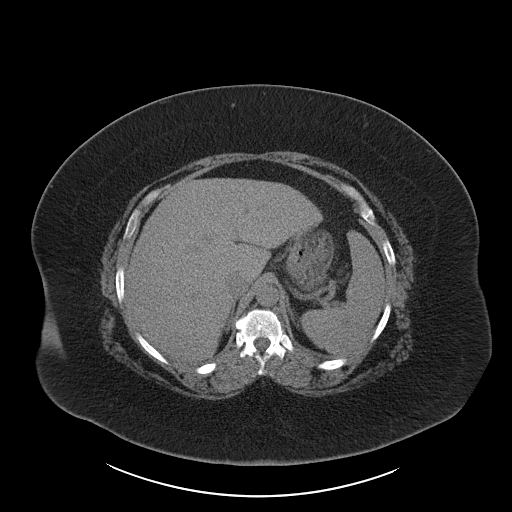
[im 80/85  soft-tissue]
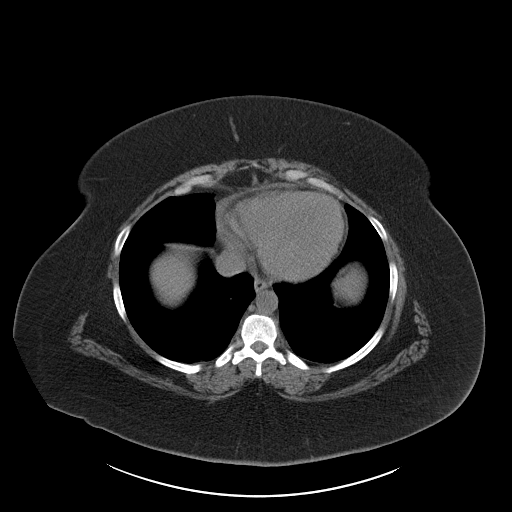

[Series 5: coronal st · coronal · 0.96mm/px · 3 of 211 slices shown]
[im 71/211  soft-tissue]
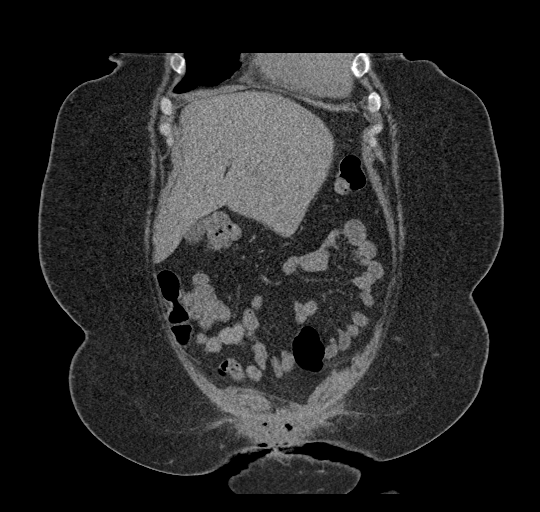
[im 94/211  soft-tissue]
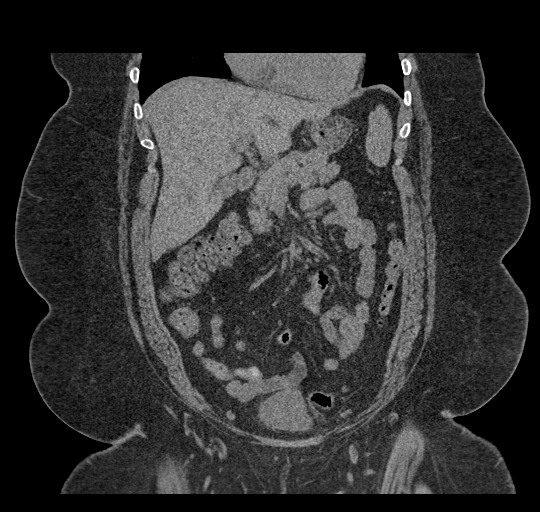
[im 117/211  soft-tissue]
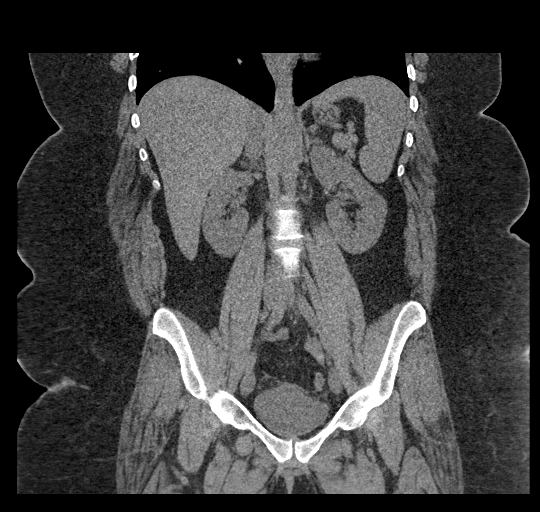

[16 of 46 positions shown; findings below may reference images not displayed]

FINDINGS: Lower chest: Lung bases demonstrate no acute consolidation or
effusion.

Hepatobiliary: No focal liver abnormality is seen. No gallstones,
gallbladder wall thickening, or biliary dilatation.

Pancreas: Unremarkable. No pancreatic ductal dilatation or
surrounding inflammatory changes.

Spleen: Normal in size without focal abnormality.

Adrenals/Urinary Tract: Adrenal glands are unremarkable. Kidneys are
normal, without renal calculi, focal lesion, or hydronephrosis.
Bladder is unremarkable.

Stomach/Bowel: Stomach is within normal limits. Appendix appears
normal. No evidence of bowel wall thickening, distention, or
inflammatory changes.

Vascular/Lymphatic: No significant vascular findings are present. No
enlarged abdominal or pelvic lymph nodes.

Reproductive: Interval hysterectomy. Small amounts of gas near the
vaginal cuff. Soft tissue stranding in the deep pelvis. Low anterior
scar with 13 by 46 mm small gas and fluid collection.

Other: Negative for free air.  No pelvic effusion.

Musculoskeletal: No acute osseous abnormality.
IMPRESSION: 1. Interval hysterectomy. 13 x 46 mm gas and fluid collection deep
to the patient's low anterior scar, indeterminate for residual
postoperative collection or infected fluid collection. Small amounts
of gas near the vaginal cuff are also indeterminate for potential
small gas and fluid collection related to prior hysterectomy.
2. Negative for bowel obstruction or other acute abnormality within
the abdomen and pelvis

## 2021-11-03 LAB — HM COLONOSCOPY

## 2021-11-08 DIAGNOSIS — M25562 Pain in left knee: Secondary | ICD-10-CM | POA: Diagnosis present

## 2021-11-09 ENCOUNTER — Other Ambulatory Visit: Payer: Self-pay

## 2021-11-09 ENCOUNTER — Encounter (HOSPITAL_COMMUNITY): Payer: Self-pay

## 2021-11-09 ENCOUNTER — Emergency Department (HOSPITAL_COMMUNITY)
Admission: EM | Admit: 2021-11-09 | Discharge: 2021-11-09 | Disposition: A | Discharge status: Home / Self Care | Payer: BLUE CROSS/BLUE SHIELD | Attending: Emergency Medicine | Admitting: Emergency Medicine

## 2021-11-09 ENCOUNTER — Emergency Department (HOSPITAL_COMMUNITY): Payer: BLUE CROSS/BLUE SHIELD

## 2021-11-09 DIAGNOSIS — M25562 Pain in left knee: Secondary | ICD-10-CM

## 2021-11-09 MED ORDER — KETOROLAC TROMETHAMINE 10 MG PO TABS: 10.0000 mg | ORAL_TABLET | Freq: Four times a day (QID) | ORAL | 0 refills | Status: DC | PRN

## 2021-11-09 MED ORDER — MELOXICAM 15 MG PO TABS
15.0000 mg | ORAL_TABLET | Freq: Once | ORAL | Status: AC
  Administered 2021-11-09: 15 mg via ORAL
  Filled 2021-11-09: qty 1

## 2021-11-09 NOTE — ED Provider Notes (Signed)
?[facility] DEPT ?Provider Note ? ? ?CSN: [id number] ?Arrival date & time: 11/08/21  2356 ? ?  ? ?History ? ?Chief Complaint  ?Patient presents with  ? Knee Pain  ? Joint Swelling  ? ? ?Karen Lutz is a 51 y.o. female. ? ?The history is provided by the patient.  ?Knee Pain ? ?51 year old female presenting to the ED with left knee pain.  States some issues over the past 2 weeks, knee is intermittently swelling but having diffuse pain.  States she broke her right foot last year and still limps somewhat on right side so bears most of her weight on her left.  She states she has tried wraps, and OTC meds without relief. ? ?Home Medications ?Prior to Admission medications   ?Medication Sig Start Date End Date Taking? Authorizing Provider  ?ketorolac (TORADOL) 10 MG tablet Take 1 tablet (10 mg total) by mouth every 6 (six) hours as needed. 11/09/21  Yes Larene Pickett, PA-C  ?lidocaine (XYLOCAINE) 2 % solution Use as directed 15 mLs in the mouth or throat every 3 (three) hours as needed for mouth pain. 05/04/21   McDonald, Mia A, PA-C  ?meloxicam (MOBIC) 7.5 MG tablet Take 1 tablet (7.5 mg total) by mouth 2 (two) times daily as needed for pain. 02/22/21   Rosemarie Ax, MD  ?methocarbamol (ROBAXIN) 500 MG tablet Take 1 tablet (500 mg total) by mouth 2 (two) times daily. 12/30/20   Couture, Cortni S, PA-C  ?ondansetron (ZOFRAN ODT) 4 MG disintegrating tablet Take 1 tablet (4 mg total) by mouth every 8 (eight) hours as needed for nausea. 03/03/21   Larene Pickett, PA-C  ?pantoprazole (PROTONIX) 40 MG tablet Take 1 tablet (40 mg total) by mouth daily. 05/04/21 06/03/21  McDonald, Mia A, PA-C  ?sucralfate (CARAFATE) 1 g tablet Take 1 tablet (1 g total) by mouth 4 (four) times daily. 05/04/21 06/03/21  McDonald, Mia A, PA-C  ?albuterol (VENTOLIN HFA) 108 (90 Base) MCG/ACT inhaler Inhale 2 puffs into the lungs every 4 (four) hours as needed for wheezing or shortness of breath. ?Patient not taking:  Reported on 10/30/2020 09/08/20 11/24/20  Hans Eden, NP  ?   ? ?Allergies    ?Penicillins and Penicillin g   ? ?Review of Systems   ?Review of Systems  ?Musculoskeletal:  Positive for arthralgias.  ?All other systems reviewed and are negative. ? ?Physical Exam ?Updated Vital Signs ?BP 117/85 (BP Location: Left Arm)   Pulse 100   Temp 98.2 ?F (36.8 ?C) (Oral)   Resp 20   LMP  (LMP Unknown)   SpO2 100%  ? ?Physical Exam ?Vitals and nursing note reviewed.  ?Constitutional:   ?   Appearance: She is well-developed. She is obese.  ?HENT:  ?   Head: Normocephalic and atraumatic.  ?Eyes:  ?   Conjunctiva/sclera: Conjunctivae normal.  ?   Pupils: Pupils are equal, round, and reactive to light.  ?Cardiovascular:  ?   Rate and Rhythm: Normal rate and regular rhythm.  ?   Heart sounds: Normal heart sounds.  ?Pulmonary:  ?   Effort: Pulmonary effort is normal. No respiratory distress.  ?   Breath sounds: Normal breath sounds. No rhonchi.  ?Abdominal:  ?   General: Bowel sounds are normal.  ?   Palpations: Abdomen is soft.  ?Musculoskeletal:     ?   General: Normal range of motion.  ?   Cervical back: Normal range of motion.  ?  Comments: Left knee without acute deformity, no overlying skin changes or warmth to touch, no appreciable effusion, DP pulse intact BLE  ?Skin: ?   General: Skin is warm and dry.  ?Neurological:  ?   Mental Status: She is alert and oriented to person, place, and time.  ? ? ?ED Results / Procedures / Treatments   ?Labs ?(all labs ordered are listed, but only abnormal results are displayed) ?Labs Reviewed - No data to display ? ?EKG ?None ? ?Radiology ?DG Knee Complete 4 Views Left ? ?Result Date: 11/09/2021 ?CLINICAL DATA:  Left knee pain. EXAM: LEFT KNEE - COMPLETE 4+ VIEW COMPARISON:  None. FINDINGS: Superficial soft tissues are unremarkable. No joint effusion is seen. There is mild osteopenia without evidence of fractures. There is mild narrowing and spurring of the medial femorotibial and  patellofemoral joints, with unremarkable lateral compartment and no visible loose bodies or erosive arthropathy. IMPRESSION: Osteopenia and mild degenerative change without evidence of fractures. Electronically Signed   By: Telford Nab M.D.   On: 11/09/2021 00:53   ? ?Procedures ?Procedures  ? ? ?Medications Ordered in ED ?Medications  ?meloxicam (MOBIC) tablet 15 mg (15 mg Oral Given 11/09/21 0207)  ? ? ?ED Course/ Medical Decision Making/ A&P ?  ?                        ?Medical Decision Making ?Amount and/or Complexity of Data Reviewed ?Radiology: ordered and independent interpretation performed. ?ECG/medicine tests: ordered and independent interpretation performed. ? ?Risk ?Prescription drug management. ? ? ?51 y.o. F here with left knee pain x2 weeks.  Has had prior injury to right foot so has been bearing more weight on left leg.   Exam without any noted deformity of the left knee.  No overlying skin changes or warmth to touch.  No significant effusion noted.  Leg is neurovascular intact.  X-ray with some degenerative changes but no acute fracture.  She has been bearing more weight on her left side due to right foot injury, suspect this is etiology of her pain.  Given meloxicam here in the ED, however states she has that at home and it is not helping.  We will plan to discharge home with Toradol.  States she has knee wraps/braces that she has used at home without much change and does not feel like a new one would help.  She does have orthopedist in [facility] that she is established with, encouraged to call and schedule follow-up.  Can return here for new concerns. ? ?Final Clinical Impression(s) / ED Diagnoses ?Final diagnoses:  ?Acute pain of left knee  ? ? ?Rx / DC Orders ?ED Discharge Orders   ? ?      Ordered  ?  ketorolac (TORADOL) 10 MG tablet  Every 6 hours PRN       ? 11/09/21 0214  ? ?  ?  ? ?  ? ? ?  ?Larene Pickett, PA-C ?11/09/21 [phone] ? ?  ?Quintella Reichert, MD ?11/09/21 [phone] ? ?

## 2021-11-09 NOTE — Discharge Instructions (Addendum)
Take the prescribed medication as directed.  Recommend to take tylenol with this for added relief.   ?Can ice and elevate knee to help with pain as well. ?Follow-up with your orthopedist.   ?Return to the ED for new or worsening symptoms. ?

## 2021-11-09 NOTE — ED Triage Notes (Signed)
Pt reports with left knee pain and swelling x 2 weeks.  ?

## 2022-01-09 ENCOUNTER — Ambulatory Visit (HOSPITAL_COMMUNITY)
Admission: EM | Admit: 2022-01-09 | Discharge: 2022-01-09 | Disposition: A | Discharge status: Home / Self Care | Payer: BLUE CROSS/BLUE SHIELD | Attending: Family Medicine | Admitting: Family Medicine

## 2022-01-09 ENCOUNTER — Encounter (HOSPITAL_COMMUNITY): Payer: Self-pay | Admitting: *Deleted

## 2022-01-09 DIAGNOSIS — H5712 Ocular pain, left eye: Secondary | ICD-10-CM

## 2022-01-09 HISTORY — DX: Pain in unspecified knee: M25.569

## 2022-01-09 MED ORDER — TETRACAINE HCL 0.5 % OP SOLN
OPHTHALMIC | Status: AC
  Filled 2022-01-09: qty 4

## 2022-01-09 MED ORDER — CEPHALEXIN 250 MG PO CAPS: 250.0000 mg | ORAL_CAPSULE | Freq: Three times a day (TID) | ORAL | 0 refills | Status: AC

## 2022-01-09 MED ORDER — GENTAMICIN SULFATE 0.3 % OP SOLN: 2.0000 [drp] | Freq: Three times a day (TID) | OPHTHALMIC | 0 refills | Status: AC

## 2022-01-09 NOTE — Discharge Instructions (Addendum)
Take cephalexin 250 mg--1 capsule 3 times daily for 5 days  Put gentamicin eyedrops in your left eye 3 times daily for 5 days  Please use the website at the back of the visit summary to set up a new patient appointment with a primary care office soon, so that she can get referred appropriately to ophthalmology

## 2022-01-09 NOTE — ED Triage Notes (Signed)
C/O left eye pain, redness x 5 days with upper lid soreness.  Reports vision has "a funny look; like blurred vision" in left eye since onset. Does not wear contact lenses. States was unable to get in with eye dr due to need for referral.

## 2022-01-09 NOTE — ED Provider Notes (Signed)
[facility]    CSN: [id number] Arrival date & time: 01/09/22  0909      History   Chief Complaint Chief Complaint  Patient presents with   Eye Problem    HPI Karen Lutz is a 51 y.o. female.    Eye Problem Her for left eye pain and soreness that began a few days ago.  First her left eyelid was a little puffy and tender.  Then her left eye has been injected and red a little bit.  She states she has seen something like pus in the corner of her left eye.  Vision has been blurry for some time, but it is worse in the left eye now  She states she called an eye doctor to be seen but they needed a referral due to insurance purposes.  Does not have a primary care provider  Past Medical History:  Diagnosis Date   Acid reflux    Anemia    Fibroids    Heart murmur    Knee pain    UTI (lower urinary tract infection)     Patient Active Problem List   Diagnosis Date Noted   Visit for wound check 10/03/2020   OA (osteoarthritis) of knee 05/09/2020   Epigastric pain 07/03/2013   Anemia 07/03/2013   Migraine 07/03/2013   Mood disorder ([place]) 02/15/2011   Iron deficiency anemia 02/15/2011   Uterine fibroid 02/15/2011   GERD (gastroesophageal reflux disease) 02/15/2011    Past Surgical History:  Procedure Laterality Date   ABDOMINAL HYSTERECTOMY     ceaserian     CESAREAN SECTION     CESAREAN SECTION     KNEE SURGERY     KNEE SURGERY      OB History   No obstetric history on file.      Home Medications    Prior to Admission medications   Medication Sig Start Date End Date Taking? Authorizing Provider  celecoxib (CELEBREX) 200 MG capsule Take 200 mg by mouth 2 (two) times daily.   Yes [provider]  pantoprazole (PROTONIX) 40 MG tablet Take 1 tablet (40 mg total) by mouth daily. 05/04/21 06/03/21  McDonald, Mia A, PA-C  sucralfate (CARAFATE) 1 g tablet Take 1 tablet (1 g total) by mouth 4 (four) times daily. 05/04/21 06/03/21  McDonald, Mia A,  PA-C  albuterol (VENTOLIN HFA) 108 (90 Base) MCG/ACT inhaler Inhale 2 puffs into the lungs every 4 (four) hours as needed for wheezing or shortness of breath. Patient not taking: Reported on 10/30/2020 09/08/20 11/24/20  Hans Eden, NP    Family History Family History  Problem Relation Age of Onset   Hypertension Other    Cancer Other    Alcohol abuse Mother    Heart disease Mother    Alcohol abuse Father    Heart disease Father    Arthritis Maternal Grandmother    Arthritis Maternal Grandfather    Arthritis Paternal Grandmother    Arthritis Paternal Grandfather     Social History Social History   Tobacco Use   Smoking status: Never   Smokeless tobacco: Never  Vaping Use   Vaping Use: Never used  Substance Use Topics   Alcohol use: No   Drug use: Never     Allergies   Penicillins and Penicillin g   Review of Systems Review of Systems   Physical Exam Triage Vital Signs ED Triage Vitals  Enc Vitals Group     BP 01/09/22 1024 114/81  Pulse Rate 01/09/22 1024 97     Resp 01/09/22 1024 18     Temp 01/09/22 1024 97.8 F (36.6 C)     Temp Source 01/09/22 1024 Oral     SpO2 01/09/22 1024 97 %     Weight --      Height --      Head Circumference --      Peak Flow --      Pain Score 01/09/22 1025 8     Pain Loc --      Pain Edu? --      Excl. in [place]? --    No data found.  Updated Vital Signs BP 114/81   Pulse 97   Temp 97.8 F (36.6 C) (Oral)   Resp 18   LMP  (LMP Unknown)   SpO2 97%   Visual Acuity Right Eye Distance: 20/70 Left Eye Distance: 20/40 Bilateral Distance: 20/40 -1  Right Eye Near:   Left Eye Near:    Bilateral Near:     Physical Exam Vitals reviewed.  Constitutional:      General: She is not in acute distress.    Appearance: She is not toxic-appearing.  HENT:     Nose: Nose normal.  Eyes:     Extraocular Movements: Extraocular movements intact.     Pupils: Pupils are equal, round, and reactive to light.     Comments:  The left eye is mildly injected.  I do not see any mass of the eye.  The left upper eyelid is a little tender.  I do not see any erythema at this time.  Cardiovascular:     Rate and Rhythm: Normal rate and regular rhythm.  Pulmonary:     Effort: Pulmonary effort is normal.     Breath sounds: Normal breath sounds.  Skin:    Coloration: Skin is not jaundiced or pale.  Neurological:     Mental Status: She is alert.  Psychiatric:        Behavior: Behavior normal.     UC Treatments / Results  Labs (all labs ordered are listed, but only abnormal results are displayed) Labs Reviewed - No data to display  EKG   Radiology No results found.  Procedures Procedures (including critical care time)  Medications Ordered in UC Medications - No data to display  Initial Impression / Assessment and Plan / UC Course  I have reviewed the triage vital signs and the nursing notes.  Pertinent labs & imaging results that were available during my care of the patient were reviewed by me and considered in my medical decision making (see chart for details).     I am going to treat with antibiotic eyedrops and a few days of oral antibiotics.  I discussed with the patient that we do not do administrative or medical referrals, due to the fact that we do not have anybody administrative work in the queue, faxing needed documents, or contacting insurance to get approval numbers.  She was resistant to this idea being true, as the eye doctor she contacted told her to come to urgent care for referral.  I discussed with her that not all practices understand what urgent care does not does not do.  We will be shown the QR code and [place] appointments website information on the back of the AVS, so that she can set up her own PCP appointment and hopefully get referred in a timely manner to an ophthalmologist.  I did offer giving her  contact information, but the problem is she needs insurance approval for her eye  visit.   She has had keflex in 2020 in our system without reaction Final diagnoses:  None   Discharge Instructions   None    ED Prescriptions   None    PDMP not reviewed this encounter.   Barrett Henle, MD 01/09/22 1050

## 2022-03-02 ENCOUNTER — Other Ambulatory Visit: Payer: Self-pay

## 2022-03-02 ENCOUNTER — Emergency Department (HOSPITAL_COMMUNITY)
Admission: EM | Admit: 2022-03-02 | Discharge: 2022-03-02 | Disposition: A | Discharge status: Home / Self Care | Payer: BLUE CROSS/BLUE SHIELD | Attending: Emergency Medicine | Admitting: Emergency Medicine

## 2022-03-02 ENCOUNTER — Encounter (HOSPITAL_COMMUNITY): Payer: Self-pay | Admitting: Emergency Medicine

## 2022-03-02 DIAGNOSIS — H5712 Ocular pain, left eye: Secondary | ICD-10-CM | POA: Diagnosis present

## 2022-03-02 MED ORDER — FLUORESCEIN SODIUM 1 MG OP STRP
1.0000 | ORAL_STRIP | Freq: Once | OPHTHALMIC | Status: AC
  Administered 2022-03-02: 1 via OPHTHALMIC
  Filled 2022-03-02: qty 1

## 2022-03-02 MED ORDER — TETRACAINE HCL 0.5 % OP SOLN
2.0000 [drp] | Freq: Once | OPHTHALMIC | Status: AC
  Administered 2022-03-02: 2 [drp] via OPHTHALMIC
  Filled 2022-03-02: qty 4

## 2022-03-02 NOTE — ED Triage Notes (Signed)
Pt reports pain in left eye. Pt reports pressure as well. Pt has been seen by PCP and opthalmologic.

## 2022-03-02 NOTE — ED Provider Notes (Signed)
[facility] DEPT Provider Note   CSN: [id number] Arrival date & time: 03/02/22  1430     History  Chief Complaint  Patient presents with   Eye Pain    Karen Lutz is a 51 y.o. female.   Eye Pain  51 year old female presents emergency department with complaints of eye redness and pain.  She states that she has had symptoms for approximately 1 month.  She was seen by her primary care as well as an [facility]mologist [facility]  The ophthalmologist recommended over-the-counter rewetting drops which she has done since the visit with minimal to no relief of symptoms.  Patient presents to the emergency department because of continuation of her symptoms feel like she has been looked past by her ophthalmologist.  She reports sensitivity to light.  Notes minor cloudiness of left-sided eye.  Denies trauma to eye.  Notes some foreign body sensation of left eye.  Denies contact use.  Home Medications Prior to Admission medications   Medication Sig Start Date End Date Taking? Authorizing Provider  celecoxib (CELEBREX) 200 MG capsule Take 200 mg by mouth 2 (two) times daily.    [provider]  pantoprazole (PROTONIX) 40 MG tablet Take 1 tablet (40 mg total) by mouth daily. 05/04/21 06/03/21  McDonald, Mia A, PA-C  sucralfate (CARAFATE) 1 g tablet Take 1 tablet (1 g total) by mouth 4 (four) times daily. 05/04/21 06/03/21  McDonald, Mia A, PA-C  albuterol (VENTOLIN HFA) 108 (90 Base) MCG/ACT inhaler Inhale 2 puffs into the lungs every 4 (four) hours as needed for wheezing or shortness of breath. Patient not taking: Reported on 10/30/2020 09/08/20 11/24/20  Hans Eden, NP      Allergies    Penicillins and Penicillin g    Review of Systems   Review of Systems  Eyes:  Positive for pain.  All other systems reviewed and are negative.   Physical Exam Updated Vital Signs BP 132/74 (BP Location: Right Arm)   Pulse 98   Temp 98.1 F (36.7 C) (Oral)    Resp 18   Ht '5\' 6"'$  (1.676 m)   Wt (!) 146.1 kg   LMP  (LMP Unknown)   SpO2 99%   BMI 51.97 kg/m  Physical Exam Vitals and nursing note reviewed.  Constitutional:      General: She is not in acute distress.    Appearance: She is well-developed.  HENT:     Head: Normocephalic and atraumatic.  Eyes:     General: Lids are normal. Lids are everted, no foreign bodies appreciated. Gaze aligned appropriately. No visual field deficit.       Right eye: No foreign body, discharge or hordeolum.        Left eye: No foreign body, discharge or hordeolum.     Intraocular pressure: Right eye pressure is 17 mmHg. Left eye pressure is 18 mmHg. Measurements were taken using a handheld tonometer.    Extraocular Movements: Extraocular movements intact.     Right eye: Normal extraocular motion and no nystagmus.     Left eye: Normal extraocular motion and no nystagmus.     Conjunctiva/sclera:     Right eye: Right conjunctiva is not injected. No chemosis or exudate.    Left eye: Left conjunctiva is injected. No chemosis or exudate.    Comments: Left-sided injection with no sparing of the limbus.  No uptake on fluorescein stain.  No sign of globe perforation, ulceration, corneal abrasion.  Eversion of eyelid showed  no foreign body retainment.  Intraocular pressure is within normal range with no evidence of acute angle glaucoma.  Left eye 20/40, right eye 20/50, both eyes together 20/40.  Patient states she has baseline visual deficits which were not corrected.  Cardiovascular:     Rate and Rhythm: Normal rate and regular rhythm.     Heart sounds: No murmur heard. Pulmonary:     Effort: Pulmonary effort is normal. No respiratory distress.     Breath sounds: Normal breath sounds.  Abdominal:     Palpations: Abdomen is soft.     Tenderness: There is no abdominal tenderness.  Musculoskeletal:        General: No swelling.     Cervical back: Neck supple.  Skin:    General: Skin is warm and dry.      Capillary Refill: Capillary refill takes less than 2 seconds.  Neurological:     Mental Status: She is alert.  Psychiatric:        Mood and Affect: Mood normal.     ED Results / Procedures / Treatments   Labs (all labs ordered are listed, but only abnormal results are displayed) Labs Reviewed - No data to display  EKG None  Radiology No results found.  Procedures Procedures    Medications Ordered in ED Medications  fluorescein ophthalmic strip 1 strip (1 strip Left Eye Given by Other 03/02/22 1642)  tetracaine (PONTOCAINE) 0.5 % ophthalmic solution 2 drop (2 drops Left Eye Given by Other 03/02/22 1642)    ED Course/ Medical Decision Making/ A&P                           Medical Decision Making  This patient presents to the ED for concern of eye pain, this involves an extensive number of treatment options, and is a complaint that carries with it a high risk of complications and morbidity.  The differential diagnosis includes The emergent differential diagnosis for acute eye pain includes, but is not limited to ocular ischemia, optic neuritis, temporal arteritis, Sinusitis, neuralgia, Migraine, Acute angle closure glaucoma, eye trauma, uveitis, iritis, corneal abrasion/ulceration, and photokeratitis.    Co morbidities that complicate the patient evaluation  Acid reflux, anemia   Additional history obtained:  Additional history obtained from EMR External records from outside source obtained and reviewed including ophthalmologist note including treatment   Lab Tests:  N/a   Imaging Studies ordered:  N/a   Cardiac Monitoring: / EKG:  The patient was maintained on a cardiac monitor.  I personally viewed and interpreted the cardiac monitored which showed an underlying rhythm of: Sinus rhythm   Consultations Obtained:  N/a   Problem List / ED Course / Critical interventions / Medication management  Eye pain I ordered medication including tetracaine for  topical anesthetic   Reevaluation of the patient after these medicines showed that the patient improved I have reviewed the patients home medicines and have made adjustments as needed   Social Determinants of Health:  Denies tobacco, illicit drug use   Test / Admission - Considered:  Eye pain Vitals signs  within normal range and stable throughout visit. Eye exam negative for acute angle glaucoma, globe perforation, foreign body retainment, corneal abrasion/ulceration.  Doubt bacterial conjunctivitis or viral conjunctivitis. Patient's symptoms could be secondary to uveitis.  Reevaluation with ophthalmology recommended.  Patient will be provided with outpatient resources with other ophthalmologist in the area that accept her insurance. Worrisome signs and symptoms  were discussed with the patient, and the patient acknowledged understanding to return to the ED if noticed. Patient was stable upon discharge.         Final Clinical Impression(s) / ED Diagnoses Final diagnoses:  Pain of left eye    Rx / DC Orders ED Discharge Orders          Ordered    Ambulatory referral to Ophthalmology       Comments: [place], [place]   03/02/22 [place], [place], PA 03/02/22 1723    Deno Etienne, DO 03/02/22 1808

## 2022-03-02 NOTE — ED Provider Triage Note (Signed)
Emergency Medicine Provider Triage Evaluation Note  Karen Lutz , a 51 y.o. female  was evaluated in triage.  Pt complains of eye redness and pain.  States that she has had pain and swelling to her left eye for about 1 month.  She was seen by primary care and referred to ophthalmologist.  States that over the past 3 days she has had worsening redness, pain to the left eye.  Review of Systems  Positive:  Negative:   Physical Exam  BP 140/90 (BP Location: Right Arm)   Pulse (!) 105   Temp 98.1 F (36.7 C) (Oral)   Resp 18   Ht '5\' 6"'$  (1.676 m)   Wt (!) 146.1 kg   LMP  (LMP Unknown)   SpO2 99%   BMI 51.97 kg/m  Gen:   Awake, no distress   Resp:  Normal effort  MSK:   Moves extremities without difficulty  Other:  Left eye injected sclera, mild puffiness to the eye  Medical Decision Making  Medically screening exam initiated at 2:39 PM.  Appropriate orders placed.  Karen Lutz was informed that the remainder of the evaluation will be completed by another provider, this initial triage assessment does not replace that evaluation, and the importance of remaining in the ED until their evaluation is complete.     Mickie Hillier, PA-C 03/02/22 1442

## 2022-03-02 NOTE — Discharge Instructions (Addendum)
Please follow-up with ophthalmology as soon as you are able.  Your work-up today was overall negative for acute eye threatening pathologies.  A second opinion may be worthwhile given that your next appointment with your current ophthalmologist is in until September.  Please do not hesitate to return to the emergency department if worrisome signs and symptoms we discussed become apparent

## 2022-03-12 ENCOUNTER — Encounter (HOSPITAL_COMMUNITY): Payer: Self-pay | Admitting: Emergency Medicine

## 2022-03-12 ENCOUNTER — Emergency Department (HOSPITAL_COMMUNITY): Payer: BLUE CROSS/BLUE SHIELD

## 2022-03-12 ENCOUNTER — Emergency Department (HOSPITAL_COMMUNITY)
Admission: EM | Admit: 2022-03-12 | Discharge: 2022-03-12 | Disposition: A | Discharge status: Home / Self Care | Payer: BLUE CROSS/BLUE SHIELD | Attending: Emergency Medicine | Admitting: Emergency Medicine

## 2022-03-12 DIAGNOSIS — R519 Headache, unspecified: Secondary | ICD-10-CM | POA: Insufficient documentation

## 2022-03-12 MED ORDER — SODIUM CHLORIDE 0.9 % IV BOLUS
1000.0000 mL | Freq: Once | INTRAVENOUS | Status: AC
  Administered 2022-03-12: 1000 mL via INTRAVENOUS

## 2022-03-12 MED ORDER — DIPHENHYDRAMINE HCL 50 MG/ML IJ SOLN
50.0000 mg | Freq: Once | INTRAMUSCULAR | Status: AC
Start: 2022-03-12 — End: 2022-03-12
  Administered 2022-03-12: 50 mg via INTRAVENOUS
  Filled 2022-03-12: qty 1

## 2022-03-12 MED ORDER — METOCLOPRAMIDE HCL 5 MG/ML IJ SOLN
10.0000 mg | Freq: Once | INTRAMUSCULAR | Status: AC
  Administered 2022-03-12: 10 mg via INTRAVENOUS
  Filled 2022-03-12: qty 2

## 2022-03-12 MED ORDER — ONDANSETRON 4 MG PO TBDP
4.0000 mg | ORAL_TABLET | Freq: Once | ORAL | Status: AC
  Administered 2022-03-12: 4 mg via ORAL
  Filled 2022-03-12: qty 1

## 2022-03-12 NOTE — ED Notes (Signed)
I provided reinforced discharge education based off of after visit summary/care provided. Pt acknowledged and understood my education. Pt had no further questions/concerns for provider/myself. After visit summary provided to pt. 

## 2022-03-12 NOTE — ED Provider Notes (Signed)
[facility] DEPT Provider Note   CSN: [id number] Arrival date & time: 03/12/22  1546     History  Chief Complaint  Patient presents with   Headache    Karen Lutz is a 51 y.o. female with a history of anemia, uterine fibroids, and reported history of migraines.  Presents to the emergency department chief complaint of headache.  Patient states that she woke from a nap at 1 AM with a headache.  Headache is located to her frontal temporal aspect and feels like pressure behind her right eye.  Patient reports that pain onset was progressive and has gotten progressively worse over time.  Patient endorses photophobia and nausea.  Patient reports that this headache feels like previous migraine she has had in the past however she states that she has not had a migraine in many years.  Patient does not follow with a neurologist or take any regular medications for migraine headaches.  Patient denies any recent falls or traumatic head injuries  Patient denies any visual disturbance, numbness, weakness, facial asymmetry, dysarthria, neck pain, neck stiffness, lightheadedness, syncope, seizures.   Headache Associated symptoms: no abdominal pain, no back pain, no fever, no nausea, no neck pain, no numbness, no seizures, no vomiting and no weakness        Home Medications Prior to Admission medications   Medication Sig Start Date End Date Taking? Authorizing Provider  celecoxib (CELEBREX) 200 MG capsule Take 200 mg by mouth 2 (two) times daily.    [provider]  pantoprazole (PROTONIX) 40 MG tablet Take 1 tablet (40 mg total) by mouth daily. 05/04/21 06/03/21  McDonald, Mia A, PA-C  sucralfate (CARAFATE) 1 g tablet Take 1 tablet (1 g total) by mouth 4 (four) times daily. 05/04/21 06/03/21  McDonald, Mia A, PA-C  albuterol (VENTOLIN HFA) 108 (90 Base) MCG/ACT inhaler Inhale 2 puffs into the lungs every 4 (four) hours as needed for wheezing or shortness of  breath. Patient not taking: Reported on 10/30/2020 09/08/20 11/24/20  Hans Eden, NP      Allergies    Penicillins and Penicillin g    Review of Systems   Review of Systems  Constitutional:  Negative for chills and fever.  Eyes:  Negative for visual disturbance.  Respiratory:  Negative for shortness of breath.   Cardiovascular:  Negative for chest pain.  Gastrointestinal:  Negative for abdominal pain, nausea and vomiting.  Genitourinary:  Negative for difficulty urinating and dysuria.  Musculoskeletal:  Negative for back pain and neck pain.  Skin:  Negative for color change and rash.  Neurological:  Positive for headaches. Negative for tremors, seizures, syncope, facial asymmetry, speech difficulty, weakness, light-headedness and numbness.  Psychiatric/Behavioral:  Negative for confusion.     Physical Exam Updated Vital Signs BP 116/84 (BP Location: Right Arm)   Pulse 95   Temp 97.6 F (36.4 C) (Oral)   Resp 17   Ht '5\' 6"'$  (1.676 m)   Wt (!) 146 kg   LMP  (LMP Unknown)   SpO2 95%   BMI 51.95 kg/m  Physical Exam Vitals and nursing note reviewed.  Constitutional:      General: She is not in acute distress.    Appearance: She is not ill-appearing, toxic-appearing or diaphoretic.  Eyes:     General:        Right eye: No discharge.        Left eye: No discharge.     Extraocular Movements: Extraocular movements intact.  Conjunctiva/sclera: Conjunctivae normal.     Pupils: Pupils are equal, round, and reactive to light.  Cardiovascular:     Rate and Rhythm: Normal rate.  Pulmonary:     Effort: Pulmonary effort is normal.  Musculoskeletal:     Cervical back: Normal range of motion and neck supple. No rigidity.  Skin:    General: Skin is warm and dry.  Neurological:     General: No focal deficit present.     Mental Status: She is alert and oriented to person, place, and time.     GCS: GCS eye subscore is 4. GCS verbal subscore is 5. GCS motor subscore is 6.      Cranial Nerves: No cranial nerve deficit or facial asymmetry.     Sensory: Sensation is intact.     Motor: No weakness, tremor, seizure activity or pronator drift.     Coordination: Finger-Nose-Finger Test normal.     Gait: Gait is intact. Gait normal.     Comments: CN II-XII intact, equal grip strength, +5 strength to bilateral upper and lower extremities    Psychiatric:        Behavior: Behavior is cooperative.     ED Results / Procedures / Treatments   Labs (all labs ordered are listed, but only abnormal results are displayed) Labs Reviewed - No data to display  EKG None  Radiology CT HEAD WO CONTRAST (5MM)  Result Date: 03/12/2022 CLINICAL DATA:  Migraine with nausea EXAM: CT HEAD WITHOUT CONTRAST TECHNIQUE: Contiguous axial images were obtained from the base of the skull through the vertex without intravenous contrast. RADIATION DOSE REDUCTION: This exam was performed according to the departmental dose-optimization program which includes automated exposure control, adjustment of the mA and/or kV according to patient size and/or use of iterative reconstruction technique. COMPARISON:  06/24/2020 FINDINGS: Brain: No evidence of acute infarction, hemorrhage, mass, mass effect, or midline shift. No hydrocephalus or extra-axial fluid collection. Vascular: No hyperdense vessel. 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 No acute osseous abnormality. Sinuses/Orbits: Mucosal thickening in the ethmoid air cells and right frontal sinus. Air-fluid level with bubbly fluid in the left sphenoid sinus. The orbits are unremarkable. Other: The mastoid air cells are well aerated. IMPRESSION: 1. Air-fluid level with bubbly fluid in the left sphenoid sinus, as can be seen with acute sinusitis. Correlate with symptoms. 2. No additional acute intracranial process. Electronically Signed   By: Merilyn Baba M.D.   On: 03/12/2022 17:28     Procedures Procedures    Medications Ordered in ED Medications  ondansetron (ZOFRAN-ODT) disintegrating tablet 4 mg (4 mg Oral Given 03/12/22 1627)  metoCLOPramide (REGLAN) injection 10 mg (10 mg Intravenous Given 03/12/22 1929)  diphenhydrAMINE (BENADRYL) injection 50 mg (50 mg Intravenous Given 03/12/22 1926)  sodium chloride 0.9 % bolus 1,000 mL (1,000 mLs Intravenous New Bag/Given 03/12/22 1926)    ED Course/ Medical Decision Making/ A&P                           Medical Decision Making Amount and/or Complexity of Data Reviewed Radiology: ordered.  Risk Prescription drug management.   Alert 51 year old female in no acute distress, nontoxic-appearing.  Presents to the ED with a chief complaint of headache.  Information was obtained from patient.  I reviewed patient's past medical records including previous prior notes, labs, and imaging.  Patient has medical history as outlined in HPI which complicates her care.  While patient has reported history of migraine she has not had any migraine in multiple years.  We will treat her headache as a new or worsening headache and a individual greater than 15 obtain CT imaging.  CT imaging will be obtained within 6 hours of headache onset and therefore also be helpful to evaluate for subarachnoid hemorrhage.  We will give patient migraine cocktail.  I personally viewed interpret patient CT imaging.  Agree with radiology interpretation of no acute intracranial process.  Patient reports resolution of her headache and nausea after receiving migraine cocktail.  Patient is able to stand and ambulate without difficulty.  Will discharge patient to follow-up with PCP.  Patient is given information to follow-up with neurology if she has frequent headaches.  Based on patient's chief complaint, I considered admission might be necessary, however after reassuring ED workup feel patient is reasonable for discharge.  Discussed results, findings, treatment and  follow up. Patient advised of return precautions. Patient verbalized understanding and agreed with plan.  Portions of this note were generated with [employer]. Dictation errors may occur despite best attempts at proofreading.         Final Clinical Impression(s) / ED Diagnoses Final diagnoses:  None    Rx / DC Orders ED Discharge Orders     None         Dyann Ruddle 03/12/22 Rex Kras, MD 03/12/22 2355

## 2022-03-12 NOTE — Discharge Instructions (Signed)
You came to the emergency department today to be evaluated for your headache.  The CT scan of your head did not show any acute intracranial abnormalities.  Your headache improved after receiving medication.  Please follow-up with your primary care doctor.  Additionally I have given you information to follow-up with neurology if you have frequent headaches.  Get help right away if: Your headache: Becomes severe quickly. Gets worse after moderate to intense physical activity. You have any of these symptoms: Repeated vomiting. Pain or stiffness in your neck. Changes to your vision. Pain in an eye or ear. Problems with speech. Muscular weakness or loss of muscle control. Loss of balance or coordination. You feel faint or pass out. You have confusion. You have a seizure.

## 2022-03-12 NOTE — ED Provider Triage Note (Signed)
Emergency Medicine Provider Triage Evaluation Note  Karen Lutz , a 51 y.o. female  was evaluated in triage.  Pt complains of headache.  Patient reports that she woke from nap at 1 AM with a headache.  Headache is located to her frontotemporal aspect and feeling of pressure behind her right eye.  Pain is gone progressively worse over time.  Patient Dors is photophobia and nausea.  Patient reports that she has a history of migraines however does not follow with neurologist and take any medications for her migraines.  Patient reports that she has not had a migraine for many years.  Review of Systems  Positive: Headache, nausea, photophobia Negative: Numbness, weakness, facial asymmetry, dysarthria, visual disturbance  Physical Exam  BP (!) 118/102 (BP Location: Left Arm)   Pulse 92   Temp 98 F (36.7 C) (Oral)   Resp 15   Ht '5\' 6"'$  (1.676 m)   Wt (!) 146 kg   LMP  (LMP Unknown)   SpO2 98%   BMI 51.95 kg/m  Gen:   Awake, no distress   Resp:  Normal effort  MSK:   Moves extremities without difficulty  Other:  No facial asymmetry or dysarthria.  Patient moves all limbs equally without difficult  Medical Decision Making  Medically screening exam initiated at 4:27 PM.  Appropriate orders placed.  Sharen Heck Dempsey was informed that the remainder of the evaluation will be completed by another provider, this initial triage assessment does not replace that evaluation, and the importance of remaining in the ED until their evaluation is complete.     Loni Beckwith, [place] 03/12/22 1628

## 2022-03-12 NOTE — ED Triage Notes (Signed)
Pt arrives via EMS from home with migraine. Hx of migraine. Endorses nausea.

## 2022-03-12 NOTE — ED Notes (Signed)
Patient is upset to be placed in triage room 5 for treatment   it was explained that she would be seen here by the doctor and then other decisions would be made if she needed to be in the main ED

## 2022-06-16 ENCOUNTER — Encounter (HOSPITAL_COMMUNITY): Payer: Self-pay

## 2022-06-16 ENCOUNTER — Other Ambulatory Visit: Payer: Self-pay

## 2022-06-16 ENCOUNTER — Emergency Department (HOSPITAL_COMMUNITY): Payer: BLUE CROSS/BLUE SHIELD

## 2022-06-16 ENCOUNTER — Emergency Department (HOSPITAL_COMMUNITY)
Admission: EM | Admit: 2022-06-16 | Discharge: 2022-06-16 | Disposition: A | Discharge status: Home / Self Care | Payer: BLUE CROSS/BLUE SHIELD | Attending: Emergency Medicine | Admitting: Emergency Medicine

## 2022-06-16 DIAGNOSIS — E049 Nontoxic goiter, unspecified: Secondary | ICD-10-CM

## 2022-06-16 DIAGNOSIS — R7401 Elevation of levels of liver transaminase levels: Secondary | ICD-10-CM | POA: Diagnosis not present

## 2022-06-16 DIAGNOSIS — M546 Pain in thoracic spine: Secondary | ICD-10-CM | POA: Diagnosis not present

## 2022-06-16 DIAGNOSIS — M545 Low back pain, unspecified: Secondary | ICD-10-CM | POA: Diagnosis present

## 2022-06-16 DIAGNOSIS — R748 Abnormal levels of other serum enzymes: Secondary | ICD-10-CM

## 2022-06-16 LAB — COMPREHENSIVE METABOLIC PANEL
ALT: 32 U/L (ref 0–44)
AST: 48 U/L — ABNORMAL HIGH (ref 15–41)
Albumin: 3.6 g/dL (ref 3.5–5.0)
Alkaline Phosphatase: 152 U/L — ABNORMAL HIGH (ref 38–126)
Anion gap: 8 (ref 5–15)
BUN: 10 mg/dL (ref 6–20)
CO2: 28 mmol/L (ref 22–32)
Calcium: 9.8 mg/dL (ref 8.9–10.3)
Chloride: 104 mmol/L (ref 98–111)
Creatinine, Ser: 0.54 mg/dL (ref 0.44–1.00)
GFR, Estimated: >60 mL/min (ref >60)
Glucose, Bld: 96 mg/dL (ref 70–99)
Potassium: 3.7 mmol/L (ref 3.5–5.1)
Sodium: 140 mmol/L (ref 135–145)
Total Bilirubin: 1.3 mg/dL — ABNORMAL HIGH (ref 0.3–1.2)
Total Protein: 7.4 g/dL (ref 6.5–8.1)

## 2022-06-16 LAB — CBC WITH DIFFERENTIAL/PLATELET
Abs Immature Granulocytes: 0.01 10*3/uL (ref 0.00–0.07)
Basophils Absolute: 0 10*3/uL (ref 0.0–0.1)
Basophils Relative: 1 %
Eosinophils Absolute: 0.1 10*3/uL (ref 0.0–0.5)
Eosinophils Relative: 2 %
HCT: 40.4 % (ref 36.0–46.0)
Hemoglobin: 12.6 g/dL (ref 12.0–15.0)
Immature Granulocytes: 0 %
Lymphocytes Relative: 41 %
Lymphs Abs: 1.7 10*3/uL (ref 0.7–4.0)
MCH: 24.3 pg — ABNORMAL LOW (ref 26.0–34.0)
MCHC: 31.2 g/dL (ref 30.0–36.0)
MCV: 78 fL — ABNORMAL LOW (ref 80.0–100.0)
Monocytes Absolute: 0.5 10*3/uL (ref 0.1–1.0)
Monocytes Relative: 13 %
Neutro Abs: 1.8 10*3/uL (ref 1.7–7.7)
Neutrophils Relative %: 43 %
Platelets: 225 10*3/uL (ref 150–400)
RBC: 5.18 MIL/uL — ABNORMAL HIGH (ref 3.87–5.11)
RDW: 16 % — ABNORMAL HIGH (ref 11.5–15.5)
WBC: 4.2 10*3/uL (ref 4.0–10.5)
nRBC: 0 % (ref 0.0–0.2)

## 2022-06-16 LAB — URINALYSIS, ROUTINE W REFLEX MICROSCOPIC
Bilirubin Urine: NEGATIVE
Glucose, UA: NEGATIVE mg/dL
Hgb urine dipstick: NEGATIVE
Ketones, ur: 80 mg/dL — AB
Leukocytes,Ua: NEGATIVE
Nitrite: NEGATIVE
Protein, ur: 30 mg/dL — AB
Specific Gravity, Urine: 1.023 (ref 1.005–1.030)
pH: 5 (ref 5.0–8.0)

## 2022-06-16 LAB — LIPASE, BLOOD: Lipase: 70 U/L — ABNORMAL HIGH (ref 11–51)

## 2022-06-16 LAB — D-DIMER, QUANTITATIVE: D-Dimer, Quant: 4.11 ug/mL-FEU — ABNORMAL HIGH (ref 0.00–0.50)

## 2022-06-16 MED ORDER — IOHEXOL 350 MG/ML SOLN
75.0000 mL | Freq: Once | INTRAVENOUS | Status: AC | PRN
  Administered 2022-06-16: 75 mL via INTRAVENOUS

## 2022-06-16 MED ORDER — HYDROCODONE-ACETAMINOPHEN 5-325 MG PO TABS
1.0000 | ORAL_TABLET | Freq: Once | ORAL | Status: AC
  Administered 2022-06-16: 1 via ORAL
  Filled 2022-06-16: qty 1

## 2022-06-16 MED ORDER — OXYCODONE HCL 5 MG PO TABS: 5.0000 mg | ORAL_TABLET | ORAL | 0 refills | Status: DC | PRN

## 2022-06-16 NOTE — Discharge Instructions (Signed)
I have prescribed a few more of the opioid medication for your back pain.  There was not any sign of a blood clot.  It likely is a muscle strain.  Your liver enzymes were mildly elevated.  Make sure you contact your surgeon on Monday about this so they can keep an eye on these values.  Your thyroid gland was noted to be enlarged on the CT scan.  It is recommended that you have a follow-up ultrasound of your thyroid gland.  This can be arranged by your primary care doctor.  Contact them for further guidance.  Return to the emergency room if you have any worsening symptoms.

## 2022-06-16 NOTE — ED Triage Notes (Addendum)
Pt c/o left upper back pain 2 days ago. Pt states she had bariatric surgery 1 week ago at [facility], and she did have a lot of gas pain but it was in the chest area. She denies sob or urinary symptoms. Pt states it is a constant pain, but worse when she is sitting straight up.

## 2022-06-16 NOTE — ED Provider Notes (Signed)
[facility] DEPT Provider Note   CSN: [id number] Arrival date & time: 06/16/22  0247     History  Chief Complaint  Patient presents with   Back Pain    Left Upper Area    Karen Lutz is a 51 y.o. female.  Patient is a 51 year old female who presents with left upper back pain.  She recently had bariatric surgery on November 1 at [facility].  She said her abdomen has been healing well.  She does not have any significant abdominal pain.  She was having a lot of gas but that has subsided.  Her wounds have been healing well.  Over the last 2 days she has had some pain in her left upper back.  It is worse when she moves or tries to sit up.  She has a little bit of pain when she takes a deep breath but mostly with movement.  No shortness of breath.  No leg pain or swelling.  No fevers.  No nausea or vomiting.       Home Medications Prior to Admission medications   Medication Sig Start Date End Date Taking? Authorizing Provider  oxyCODONE (ROXICODONE) 5 MG immediate release tablet Take 1 tablet (5 mg total) by mouth every 4 (four) hours as needed for severe pain. 06/16/22  Yes Malvin Johns, MD  celecoxib (CELEBREX) 200 MG capsule Take 200 mg by mouth 2 (two) times daily.    [provider]  pantoprazole (PROTONIX) 40 MG tablet Take 1 tablet (40 mg total) by mouth daily. 05/04/21 06/03/21  McDonald, Mia A, PA-C  sucralfate (CARAFATE) 1 g tablet Take 1 tablet (1 g total) by mouth 4 (four) times daily. 05/04/21 06/03/21  McDonald, Mia A, PA-C  albuterol (VENTOLIN HFA) 108 (90 Base) MCG/ACT inhaler Inhale 2 puffs into the lungs every 4 (four) hours as needed for wheezing or shortness of breath. Patient not taking: Reported on 10/30/2020 09/08/20 11/24/20  Hans Eden, NP      Allergies    Penicillins and Penicillin g    Review of Systems   Review of Systems  Constitutional:  Negative for chills, diaphoresis, fatigue and  fever.  HENT:  Negative for congestion, rhinorrhea and sneezing.   Eyes: Negative.   Respiratory:  Negative for cough, chest tightness and shortness of breath.   Cardiovascular:  Negative for chest pain and leg swelling.  Gastrointestinal:  Negative for abdominal pain, blood in stool, diarrhea, nausea and vomiting.  Genitourinary:  Negative for difficulty urinating, flank pain, frequency and hematuria.  Musculoskeletal:  Positive for back pain. Negative for arthralgias.  Skin:  Negative for rash.  Neurological:  Negative for dizziness, speech difficulty, weakness, numbness and headaches.    Physical Exam Updated Vital Signs BP 120/66   Pulse 77   Temp 97.8 F (36.6 C) (Oral)   Resp 16   Ht '5\' 6"'$  (1.676 m)   Wt (!) 145.2 kg   LMP  (LMP Unknown)   SpO2 100%   BMI 51.65 kg/m  Physical Exam Constitutional:      Appearance: She is well-developed. She is obese.  HENT:     Head: Normocephalic and atraumatic.  Eyes:     Pupils: Pupils are equal, round, and reactive to light.  Cardiovascular:     Rate and Rhythm: Normal rate and regular rhythm.     Heart sounds: Normal heart sounds.  Pulmonary:     Effort: Pulmonary effort is normal. No respiratory distress.  Breath sounds: Normal breath sounds. No wheezing or rales.  Chest:     Chest wall: No tenderness.  Abdominal:     General: Bowel sounds are normal.     Palpations: Abdomen is soft.     Tenderness: There is no abdominal tenderness. There is no guarding or rebound.     Comments: Multiple small healing surgical incisions about the abdomen.  They appear to be healing well.  No drainage or signs of infection.  No significant abdominal pain on exam.  Musculoskeletal:        General: Normal range of motion.     Cervical back: Normal range of motion and neck supple.     Comments: Patient had reproducible tenderness to the musculature of the left upper back.  No spinal tenderness.  No rashes.  Lymphadenopathy:     Cervical: No  cervical adenopathy.  Skin:    General: Skin is warm and dry.     Findings: No rash.  Neurological:     Mental Status: She is alert and oriented to person, place, and time.     ED Results / Procedures / Treatments   Labs (all labs ordered are listed, but only abnormal results are displayed) Labs Reviewed  URINALYSIS, ROUTINE W REFLEX MICROSCOPIC - Abnormal; Notable for the following components:      Result Value   Color, Urine AMBER (*)    APPearance HAZY (*)    Ketones, ur 80 (*)    Protein, ur 30 (*)    Bacteria, UA RARE (*)    All other components within normal limits  CBC WITH DIFFERENTIAL/PLATELET - Abnormal; Notable for the following components:   RBC 5.18 (*)    MCV 78.0 (*)    MCH 24.3 (*)    RDW 16.0 (*)    All other components within normal limits  COMPREHENSIVE METABOLIC PANEL - Abnormal; Notable for the following components:   AST 48 (*)    Alkaline Phosphatase 152 (*)    Total Bilirubin 1.3 (*)    All other components within normal limits  LIPASE, BLOOD - Abnormal; Notable for the following components:   Lipase 70 (*)    All other components within normal limits  D-DIMER, QUANTITATIVE - Abnormal; Notable for the following components:   D-Dimer, Quant 4.11 (*)    All other components within normal limits    EKG None  Radiology CT Angio Chest PE W/Cm &/Or Wo Cm  Result Date: 06/16/2022 CLINICAL DATA:  Positive D-dimer, recent gastric surgery, chest pain EXAM: CT ANGIOGRAPHY CHEST WITH CONTRAST TECHNIQUE: Multidetector CT imaging of the chest was performed using the standard protocol during bolus administration of intravenous contrast. Multiplanar CT image reconstructions and MIPs were obtained to evaluate the vascular anatomy. RADIATION DOSE REDUCTION: This exam was performed according to the departmental dose-optimization program which includes automated exposure control, adjustment of the mA and/or kV according to patient size and/or use of iterative  reconstruction technique. CONTRAST:  18m OMNIPAQUE IOHEXOL 350 MG/ML SOLN COMPARISON:  Chest radiograph done earlier today FINDINGS: Cardiovascular: There are no intraluminal filling defects in pulmonary artery branches. There is homogeneous enhancement in thoracic aorta. Right subclavian artery is arising from the left side of aortic arch traversing the posterior mediastinum behind the esophagus. Mediastinum/Nodes: No significant lymphadenopathy is seen. There is asymmetric enlargement of right lobe of thyroid with possible 2.2 cm nodule. Lungs/Pleura: There is no focal pulmonary consolidation. There is no pleural effusion or pneumothorax. Upper Abdomen: There is evidence of  gastric bypass surgery. Musculoskeletal: Unremarkable. Review of the MIP images confirms the above findings. IMPRESSION: There is no evidence of pulmonary artery embolism. There is no evidence of thoracic aortic dissection. There is no focal pulmonary consolidation. Right lobe of thyroid is markedly enlarged in comparison to the left lobe. Follow-up thyroid sonogram in nonemergent outpatient setting should be considered. Electronically Signed   By: Elmer Picker M.D.   On: 06/16/2022 14:58   DG Chest 2 View  Result Date: 06/16/2022 CLINICAL DATA:  Upper back pain EXAM: CHEST - 2 VIEW COMPARISON:  09/08/2020 FINDINGS: Transverse diameter of heart is slightly increased. There are no signs of pulmonary edema or focal pulmonary consolidation. There is no pleural effusion or pneumothorax. IMPRESSION: No active cardiopulmonary disease. Electronically Signed   By: Elmer Picker M.D.   On: 06/16/2022 13:29    Procedures Procedures    Medications Ordered in ED Medications  HYDROcodone-acetaminophen (NORCO/VICODIN) 5-325 MG per tablet 1 tablet (1 tablet Oral Given 06/16/22 1328)  iohexol (OMNIPAQUE) 350 MG/ML injection 75 mL (75 mLs Intravenous Contrast Given 06/16/22 1441)    ED Course/ Medical Decision Making/ A&P                            Medical Decision Making Problems Addressed: Acute left-sided thoracic back pain: acute illness or injury Elevated liver enzymes: undiagnosed new problem with uncertain prognosis Enlarged thyroid: undiagnosed new problem with uncertain prognosis  Amount and/or Complexity of Data Reviewed External Data Reviewed: labs and notes. Labs: ordered. Decision-making details documented in ED Course. Radiology: ordered and independent interpretation performed. Decision-making details documented in ED Course.  Risk Prescription drug management. Decision regarding hospitalization.   Patient is a 51 year old who presents with left upper back pain.  She had recent bariatric surgery.  It seems to be musculoskeletal in nature although she did have some pleuritic component.  Her D-dimer was elevated.  Chest x-ray two-view was interpreted by me and confirmed by the radiologist that show no evidence of pneumothorax.  No pneumonia.  No pulmonary edema.  She does not have symptoms that sound more concerning for infection.  CT scan was performed which shows no evidence of PE.  No pneumonia.  There was some inflammation of her right thyroid gland.  I discussed this with the patient and advised her that she will need an outpatient ultrasound which can be arranged by her primary care doctor.  Her other labs are reviewed.  Her LFTs and lipase are mildly elevated.  She does not have any abdominal pain which would be more concerning for pancreatitis or cholecystitis.  I question whether this is related to the recent surgery she had.  I discussed these findings with her and advised her to contact her surgeon on Monday and they can follow it.  I suspect her back pain is musculoskeletal.  She does not have any urinary symptoms.  No evidence of PE.  She was given a dose of hydrocodone and feels better.  She previously was taking oxycodone after the surgery.  We will give her short course of ongoing oxycodone.  She  is taking a stool softener along with it.  She was encouraged to have follow-up with her doctor.  Return precautions were given.  Final Clinical Impression(s) / ED Diagnoses Final diagnoses:  Acute left-sided thoracic back pain  Enlarged thyroid  Elevated liver enzymes    Rx / DC Orders ED Discharge Orders  Ordered    oxyCODONE (ROXICODONE) 5 MG immediate release tablet  Every 4 hours PRN        06/16/22 1508              Malvin Johns, MD 06/16/22 1512

## 2022-06-17 ENCOUNTER — Emergency Department (HOSPITAL_COMMUNITY)
Admission: EM | Admit: 2022-06-17 | Discharge: 2022-06-18 | Discharge status: Against Medical Advice | Payer: BLUE CROSS/BLUE SHIELD | Attending: Emergency Medicine | Admitting: Emergency Medicine

## 2022-06-17 ENCOUNTER — Other Ambulatory Visit: Payer: Self-pay

## 2022-06-17 ENCOUNTER — Encounter (HOSPITAL_COMMUNITY): Payer: Self-pay

## 2022-06-17 DIAGNOSIS — Z5321 Procedure and treatment not carried out due to patient leaving prior to being seen by health care provider: Secondary | ICD-10-CM | POA: Diagnosis not present

## 2022-06-17 DIAGNOSIS — Z20822 Contact with and (suspected) exposure to covid-19: Secondary | ICD-10-CM | POA: Diagnosis not present

## 2022-06-17 DIAGNOSIS — R079 Chest pain, unspecified: Secondary | ICD-10-CM | POA: Insufficient documentation

## 2022-06-17 DIAGNOSIS — R0602 Shortness of breath: Secondary | ICD-10-CM | POA: Diagnosis not present

## 2022-06-17 DIAGNOSIS — M546 Pain in thoracic spine: Secondary | ICD-10-CM | POA: Insufficient documentation

## 2022-06-17 LAB — TROPONIN I (HIGH SENSITIVITY)
Troponin I (High Sensitivity): 11 ng/L (ref <18)
Troponin I (High Sensitivity): 10 ng/L (ref <18)

## 2022-06-17 LAB — RESP PANEL BY RT-PCR (FLU A&B, COVID) ARPGX2
Influenza A by PCR: NEGATIVE
Influenza B by PCR: NEGATIVE
SARS Coronavirus 2 by RT PCR: NEGATIVE

## 2022-06-17 NOTE — ED Triage Notes (Signed)
Pt to er, pt states that she was here yesterday and was told that she tested positive for a blood clot.  States that she is here because the pain is worse and it feels hard to breath.

## 2022-06-17 NOTE — ED Provider Triage Note (Signed)
Emergency Medicine Provider Triage Evaluation Note  Karen Lutz , a 51 y.o. female  was evaluated in triage.  Pt complains of chest pain and shortness of breath. She states that same has been ongoing for the past 8 days. Pain is in her left upper back and radiating into her chest. Recent bariatric surgery on November 1. Had work-up yesterday including labs and PE study that were reassuring. Denies fevers, chills, abdominal pain, nausea, vomiting, or diarrhea. Does endorse some cough and congestion.  Review of Systems  Positive:  Negative:   Physical Exam  BP 114/84 (BP Location: Left Arm)   Pulse 99   Temp 98.1 F (36.7 C) (Oral)   Resp 20   Ht 5' 6.5" (1.689 m)   Wt 135.2 kg   LMP  (LMP Unknown)   SpO2 100%   BMI 47.38 kg/m  Gen:   Awake, no distress   Resp:  Normal effort  MSK:   Moves extremities without difficulty  Other:  Well healing surgical scars and non-tender abdomen  Medical Decision Making  Medically screening exam initiated at 5:47 PM.  Appropriate orders placed.  Sharen Heck Liggett was informed that the remainder of the evaluation will be completed by another provider, this initial triage assessment does not replace that evaluation, and the importance of remaining in the ED until their evaluation is complete.  Add on trop and COVID from labs yesterday   Nestor Lewandowsky 06/17/22 1752

## 2022-07-23 ENCOUNTER — Ambulatory Visit
Admission: EM | Admit: 2022-07-23 | Discharge: 2022-07-23 | Disposition: A | Discharge status: Home / Self Care | Payer: BLUE CROSS/BLUE SHIELD | Attending: Internal Medicine | Admitting: Internal Medicine

## 2022-07-23 DIAGNOSIS — Z1152 Encounter for screening for COVID-19: Secondary | ICD-10-CM | POA: Insufficient documentation

## 2022-07-23 DIAGNOSIS — B349 Viral infection, unspecified: Secondary | ICD-10-CM | POA: Diagnosis present

## 2022-07-23 DIAGNOSIS — J45998 Other asthma: Secondary | ICD-10-CM | POA: Insufficient documentation

## 2022-07-23 LAB — POCT INFLUENZA A/B
Influenza A, POC: NEGATIVE
Influenza B, POC: NEGATIVE

## 2022-07-23 MED ORDER — OSELTAMIVIR PHOSPHATE 75 MG PO CAPS: 75.0000 mg | ORAL_CAPSULE | Freq: Two times a day (BID) | ORAL | 0 refills | Status: DC

## 2022-07-23 MED ORDER — FLUCONAZOLE 150 MG PO TABS: 150.0000 mg | ORAL_TABLET | ORAL | 0 refills | Status: DC

## 2022-07-23 MED ORDER — PREDNISONE 50 MG PO TABS: 50.0000 mg | ORAL_TABLET | Freq: Every day | ORAL | 0 refills | Status: DC

## 2022-07-23 MED ORDER — PROMETHAZINE-DM 6.25-15 MG/5ML PO SYRP: 5.0000 mL | ORAL_SOLUTION | Freq: Four times a day (QID) | ORAL | 0 refills | Status: DC | PRN

## 2022-07-23 MED ORDER — ACETAMINOPHEN 325 MG PO TABS
650.0000 mg | ORAL_TABLET | Freq: Once | ORAL | Status: AC
  Administered 2022-07-23: 650 mg via ORAL

## 2022-07-23 NOTE — Discharge Instructions (Addendum)
We will notify you of your test results as they arrive and may take between about 24 hours.  I encourage you to sign up for MyChart if you have not already done so as this can be the easiest way for [place] to communicate results to you online or through a phone app.  Generally, we only contact you if it is a positive test result.  In the meantime, if you develop worsening symptoms including fever, chest pain, shortness of breath despite our current treatment plan then please report to the emergency room as this may be a sign of worsening status from possible viral infection.  Otherwise, we will manage this as a viral syndrome. For sore throat or cough try using a honey-based tea. Use 3 teaspoons of honey with juice squeezed from half lemon. Place shaved pieces of ginger into 1/2-1 cup of water and warm over stove top. Then mix the ingredients and repeat every 4 hours as needed. Please take Tylenol '500mg'$ -'650mg'$  every 6 hours for aches and pains, fevers. Hydrate very well with at least 2 liters of water. Eat light meals such as soups to replenish electrolytes and soft fruits, veggies. Start an antihistamine like Zyrtec for postnasal drainage, sinus congestion.  You can take this together with prednisone.  Use the cough medications as needed.

## 2022-07-23 NOTE — ED Triage Notes (Signed)
Pt c/o cough, chills, body aches started yesterday-NAD-steady gait

## 2022-07-23 NOTE — ED Provider Notes (Signed)
[facility] - [facility]  Note:  This document was prepared using Systems analyst and may include unintentional dictation errors.  MRN: [id number] DOB: 12-02-1970  Subjective:   Karen Lutz is a 51 y.o. female presenting for 1 day history of acute onset persistent body aches, chills, coughing, chest tightness and discomfort from her coughing.  Reports that she has a history of seasonal asthma and has been using her albuterol inhaler.  No current facility-administered medications for this encounter.  Current Outpatient Medications:    celecoxib (CELEBREX) 200 MG capsule, Take 200 mg by mouth 2 (two) times daily., Disp: , Rfl:    oxyCODONE (ROXICODONE) 5 MG immediate release tablet, Take 1 tablet (5 mg total) by mouth every 4 (four) hours as needed for severe pain., Disp: 10 tablet, Rfl: 0   pantoprazole (PROTONIX) 40 MG tablet, Take 1 tablet (40 mg total) by mouth daily., Disp: 30 tablet, Rfl: 0   sucralfate (CARAFATE) 1 g tablet, Take 1 tablet (1 g total) by mouth 4 (four) times daily., Disp: 120 tablet, Rfl: 0   Allergies  Allergen Reactions   Penicillins Rash    Has patient had a PCN reaction causing immediate rash, facial/tongue/throat swelling, SOB or lightheadedness with hypotension: unknown Has patient had a PCN reaction causing severe rash involving mucus membranes or skin necrosis: unknown Has patient had a PCN reaction that required hospitalization : yes Has patient had a PCN reaction occurring within the last 10 years: no If all of the above answers are "NO", then may proceed with Cephalosporin use.    Penicillin G Other (See Comments)    Per patient, childhood allergy, reaction unknown.      Past Medical History:  Diagnosis Date   Acid reflux    Anemia    Fibroids    Heart murmur    Knee pain    UTI (lower urinary tract infection)      Past Surgical History:  Procedure Laterality Date   ABDOMINAL HYSTERECTOMY     BARIATRIC  SURGERY     [name] Single x1 week ago   ceaserian     CESAREAN SECTION     CESAREAN SECTION     KNEE SURGERY Left    KNEE SURGERY      Family History  Problem Relation Age of Onset   Hypertension Other    Cancer Other    Alcohol abuse Mother    Heart disease Mother    Alcohol abuse Father    Heart disease Father    Arthritis Maternal Grandmother    Arthritis Maternal Grandfather    Arthritis Paternal Grandmother    Arthritis Paternal Grandfather     Social History   Tobacco Use   Smoking status: Never   Smokeless tobacco: Never  Vaping Use   Vaping Use: Never used  Substance Use Topics   Alcohol use: No   Drug use: Never    ROS   Objective:   Vitals: BP 130/69 (BP Location: Left Arm)   Pulse (!) 113   Temp (!) 100.7 F (38.2 C)   Resp (!) 24   LMP  (LMP Unknown)   SpO2 98%   Physical Exam Constitutional:      General: She is not in acute distress.    Appearance: Normal appearance. She is well-developed. She is not ill-appearing, toxic-appearing or diaphoretic.  HENT:     Head: Normocephalic and atraumatic.     Nose: Nose normal.     Mouth/Throat:  Mouth: Mucous membranes are moist.     Pharynx: No pharyngeal swelling, oropharyngeal exudate, posterior oropharyngeal erythema or uvula swelling.     Tonsils: No tonsillar exudate or tonsillar abscesses. 0 on the right. 0 on the left.  Eyes:     General: No scleral icterus.       Right eye: No discharge.        Left eye: No discharge.     Extraocular Movements: Extraocular movements intact.  Cardiovascular:     Rate and Rhythm: Normal rate and regular rhythm.     Heart sounds: Normal heart sounds. No murmur heard.    No friction rub. No gallop.  Pulmonary:     Effort: Pulmonary effort is normal. No respiratory distress.     Breath sounds: No stridor. No wheezing, rhonchi or rales.  Chest:     Chest wall: No tenderness.  Skin:    General: Skin is warm and dry.  Neurological:     General: No  focal deficit present.     Mental Status: She is alert and oriented to person, place, and time.  Psychiatric:        Mood and Affect: Mood normal.        Behavior: Behavior normal.     Results for orders placed or performed during the hospital encounter of 07/23/22 (from the past 24 hour(s))  POCT Influenza A/B     Status: None   Collection Time: 07/23/22  7:28 PM  Result Value Ref Range   Influenza A, POC Negative Negative   Influenza B, POC Negative Negative    Assessment and Plan :   PDMP not reviewed this encounter.  1. Acute viral syndrome   2. Seasonal asthma     Given the severity of her symptoms, patient would like to go ahead and start empiric treatment with Tamiflu to address suspected influenza despite negative results in clinic.  COVID testing pending.  If she test positive for COVID, recommend stopping Tamiflu and starting Paxlovid.  In the context of her asthma, recommended an oral prednisone course. Deferred imaging given clear cardiopulmonary exam, hemodynamically stable vital signs.  I did offer patient a chest x-ray but she declined for now. Counseled patient on potential for adverse effects with medications prescribed/recommended today, ER and return-to-clinic precautions discussed, patient verbalized understanding.    Jaynee Eagles, PA-C 07/24/22 0725

## 2022-07-24 LAB — RESP PANEL BY RT-PCR (FLU A&B, COVID) ARPGX2
Influenza A by PCR: POSITIVE — AB
Influenza B by PCR: NEGATIVE
SARS Coronavirus 2 by RT PCR: NEGATIVE

## 2022-07-26 ENCOUNTER — Ambulatory Visit
Admission: EM | Admit: 2022-07-26 | Discharge: 2022-07-26 | Disposition: A | Discharge status: Home / Self Care | Payer: BLUE CROSS/BLUE SHIELD | Attending: Emergency Medicine | Admitting: Emergency Medicine

## 2022-07-26 ENCOUNTER — Ambulatory Visit (INDEPENDENT_AMBULATORY_CARE_PROVIDER_SITE_OTHER): Payer: BLUE CROSS/BLUE SHIELD

## 2022-07-26 DIAGNOSIS — S134XXA Sprain of ligaments of cervical spine, initial encounter: Secondary | ICD-10-CM

## 2022-07-26 DIAGNOSIS — M62838 Other muscle spasm: Secondary | ICD-10-CM

## 2022-07-26 MED ORDER — BACLOFEN 10 MG PO TABS: 10.0000 mg | ORAL_TABLET | Freq: Three times a day (TID) | ORAL | 0 refills | Status: AC

## 2022-07-26 MED ORDER — KETOROLAC TROMETHAMINE 30 MG/ML IJ SOLN
30.0000 mg | Freq: Once | INTRAMUSCULAR | Status: AC
  Administered 2022-07-26: 30 mg via INTRAMUSCULAR
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
## 2022-07-26 NOTE — ED Notes (Signed)
Patient is being discharged from the Urgent Care and sent to the Emergency Department via POV . Per L. Morgan-Scales PA-C , patient is in need of higher level of care due to need for further evaluation . Patient is aware and verbalizes understanding of plan of care.  Vitals:   07/26/22 1334  BP: 112/78  Pulse: 88  Resp: 17  Temp: 98 F (36.7 C)  SpO2: 97%

## 2022-07-26 NOTE — ED Notes (Signed)
Pt returned from restroom.
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
## 2022-07-26 NOTE — ED Notes (Addendum)
Rounding on patient No needs at this time. Patient made aware that we are currently waiting on her xray results.

## 2022-11-01 ENCOUNTER — Other Ambulatory Visit (HOSPITAL_COMMUNITY): Payer: Self-pay

## 2022-11-01 ENCOUNTER — Inpatient Hospital Stay (HOSPITAL_COMMUNITY): Payer: BLUE CROSS/BLUE SHIELD

## 2022-11-01 ENCOUNTER — Inpatient Hospital Stay: Payer: Self-pay

## 2022-11-01 ENCOUNTER — Encounter (HOSPITAL_COMMUNITY): Payer: Self-pay

## 2022-11-01 ENCOUNTER — Inpatient Hospital Stay (HOSPITAL_COMMUNITY)
Admission: EM | Admit: 2022-11-01 | Discharge: 2022-11-10 | DRG: 275 | Disposition: A | Discharge status: Home / Self Care | Payer: BLUE CROSS/BLUE SHIELD | Attending: Internal Medicine | Admitting: Internal Medicine

## 2022-11-01 ENCOUNTER — Emergency Department (HOSPITAL_COMMUNITY): Payer: BLUE CROSS/BLUE SHIELD

## 2022-11-01 DIAGNOSIS — E876 Hypokalemia: Secondary | ICD-10-CM | POA: Diagnosis present

## 2022-11-01 DIAGNOSIS — Z9884 Bariatric surgery status: Secondary | ICD-10-CM | POA: Diagnosis not present

## 2022-11-01 DIAGNOSIS — Z811 Family history of alcohol abuse and dependence: Secondary | ICD-10-CM

## 2022-11-01 DIAGNOSIS — J690 Pneumonitis due to inhalation of food and vomit: Secondary | ICD-10-CM | POA: Diagnosis present

## 2022-11-01 DIAGNOSIS — I5043 Acute on chronic combined systolic (congestive) and diastolic (congestive) heart failure: Secondary | ICD-10-CM | POA: Diagnosis not present

## 2022-11-01 DIAGNOSIS — I447 Left bundle-branch block, unspecified: Secondary | ICD-10-CM | POA: Diagnosis present

## 2022-11-01 DIAGNOSIS — K219 Gastro-esophageal reflux disease without esophagitis: Secondary | ICD-10-CM | POA: Diagnosis present

## 2022-11-01 DIAGNOSIS — G931 Anoxic brain damage, not elsewhere classified: Secondary | ICD-10-CM | POA: Diagnosis present

## 2022-11-01 DIAGNOSIS — G9341 Metabolic encephalopathy: Secondary | ICD-10-CM | POA: Diagnosis not present

## 2022-11-01 DIAGNOSIS — I472 Ventricular tachycardia, unspecified: Secondary | ICD-10-CM | POA: Diagnosis present

## 2022-11-01 DIAGNOSIS — R9431 Abnormal electrocardiogram [ECG] [EKG]: Secondary | ICD-10-CM | POA: Diagnosis not present

## 2022-11-01 DIAGNOSIS — Z8249 Family history of ischemic heart disease and other diseases of the circulatory system: Secondary | ICD-10-CM

## 2022-11-01 DIAGNOSIS — Z8261 Family history of arthritis: Secondary | ICD-10-CM

## 2022-11-01 DIAGNOSIS — I462 Cardiac arrest due to underlying cardiac condition: Secondary | ICD-10-CM | POA: Diagnosis present

## 2022-11-01 DIAGNOSIS — I469 Cardiac arrest, cause unspecified: Secondary | ICD-10-CM

## 2022-11-01 DIAGNOSIS — I428 Other cardiomyopathies: Secondary | ICD-10-CM | POA: Diagnosis present

## 2022-11-01 DIAGNOSIS — N179 Acute kidney failure, unspecified: Secondary | ICD-10-CM | POA: Diagnosis present

## 2022-11-01 DIAGNOSIS — R7303 Prediabetes: Secondary | ICD-10-CM

## 2022-11-01 DIAGNOSIS — E669 Obesity, unspecified: Secondary | ICD-10-CM

## 2022-11-01 DIAGNOSIS — M546 Pain in thoracic spine: Secondary | ICD-10-CM | POA: Diagnosis not present

## 2022-11-01 DIAGNOSIS — I5021 Acute systolic (congestive) heart failure: Secondary | ICD-10-CM | POA: Diagnosis not present

## 2022-11-01 DIAGNOSIS — R739 Hyperglycemia, unspecified: Secondary | ICD-10-CM

## 2022-11-01 DIAGNOSIS — D6489 Other specified anemias: Secondary | ICD-10-CM | POA: Diagnosis not present

## 2022-11-01 DIAGNOSIS — I5023 Acute on chronic systolic (congestive) heart failure: Secondary | ICD-10-CM | POA: Diagnosis present

## 2022-11-01 DIAGNOSIS — Z87440 Personal history of urinary (tract) infections: Secondary | ICD-10-CM

## 2022-11-01 DIAGNOSIS — K7200 Acute and subacute hepatic failure without coma: Secondary | ICD-10-CM | POA: Diagnosis present

## 2022-11-01 DIAGNOSIS — J9601 Acute respiratory failure with hypoxia: Secondary | ICD-10-CM | POA: Diagnosis present

## 2022-11-01 DIAGNOSIS — Z6841 Body Mass Index (BMI) 40.0 and over, adult: Secondary | ICD-10-CM | POA: Diagnosis not present

## 2022-11-01 DIAGNOSIS — R4182 Altered mental status, unspecified: Secondary | ICD-10-CM

## 2022-11-01 DIAGNOSIS — I2729 Other secondary pulmonary hypertension: Secondary | ICD-10-CM | POA: Diagnosis not present

## 2022-11-01 DIAGNOSIS — D6959 Other secondary thrombocytopenia: Secondary | ICD-10-CM | POA: Diagnosis present

## 2022-11-01 DIAGNOSIS — Z880 Allergy status to penicillin: Secondary | ICD-10-CM

## 2022-11-01 DIAGNOSIS — R570 Cardiogenic shock: Secondary | ICD-10-CM

## 2022-11-01 DIAGNOSIS — I4901 Ventricular fibrillation: Secondary | ICD-10-CM

## 2022-11-01 DIAGNOSIS — Z7952 Long term (current) use of systemic steroids: Secondary | ICD-10-CM

## 2022-11-01 DIAGNOSIS — Z79899 Other long term (current) drug therapy: Secondary | ICD-10-CM

## 2022-11-01 DIAGNOSIS — I429 Cardiomyopathy, unspecified: Secondary | ICD-10-CM | POA: Diagnosis not present

## 2022-11-01 LAB — MRSA NEXT GEN BY PCR, NASAL: MRSA by PCR Next Gen: NOT DETECTED

## 2022-11-01 LAB — COMPREHENSIVE METABOLIC PANEL
ALT: 60 U/L — ABNORMAL HIGH (ref 0–44)
AST: 209 U/L — ABNORMAL HIGH (ref 15–41)
Albumin: 2.7 g/dL — ABNORMAL LOW (ref 3.5–5.0)
Alkaline Phosphatase: 83 U/L (ref 38–126)
Anion gap: 10 (ref 5–15)
BUN: 9 mg/dL (ref 6–20)
CO2: 24 mmol/L (ref 22–32)
Calcium: 8.4 mg/dL — ABNORMAL LOW (ref 8.9–10.3)
Chloride: 106 mmol/L (ref 98–111)
Creatinine, Ser: 0.94 mg/dL (ref 0.44–1.00)
GFR, Estimated: >60 mL/min (ref >60)
Glucose, Bld: 175 mg/dL — ABNORMAL HIGH (ref 70–99)
Potassium: 2.9 mmol/L — ABNORMAL LOW (ref 3.5–5.1)
Sodium: 140 mmol/L (ref 135–145)
Total Bilirubin: 0.5 mg/dL (ref 0.3–1.2)
Total Protein: 5.2 g/dL — ABNORMAL LOW (ref 6.5–8.1)

## 2022-11-01 LAB — POCT I-STAT 7, (LYTES, BLD GAS, ICA,H+H)
Acid-base deficit: 6 mmol/L — ABNORMAL HIGH (ref 0.0–2.0)
Acid-base deficit: 7 mmol/L — ABNORMAL HIGH (ref 0.0–2.0)
Bicarbonate: 19.8 mmol/L — ABNORMAL LOW (ref 20.0–28.0)
Bicarbonate: 20.2 mmol/L (ref 20.0–28.0)
Calcium, Ion: 1.25 mmol/L (ref 1.15–1.40)
Calcium, Ion: 1.26 mmol/L (ref 1.15–1.40)
HCT: 40 % (ref 36.0–46.0)
HCT: 40 % (ref 36.0–46.0)
Hemoglobin: 13.6 g/dL (ref 12.0–15.0)
Hemoglobin: 13.6 g/dL (ref 12.0–15.0)
O2 Saturation: 97 %
O2 Saturation: 98 %
Patient temperature: 101.1
Patient temperature: 98.4
Potassium: 3.5 mmol/L (ref 3.5–5.1)
Potassium: 4 mmol/L (ref 3.5–5.1)
Sodium: 138 mmol/L (ref 135–145)
Sodium: 140 mmol/L (ref 135–145)
TCO2: 21 mmol/L — ABNORMAL LOW (ref 22–32)
TCO2: 21 mmol/L — ABNORMAL LOW (ref 22–32)
pCO2 arterial: 41.3 mmHg (ref 32–48)
pCO2 arterial: 43.4 mmHg (ref 32–48)
pH, Arterial: 7.296 — ABNORMAL LOW (ref 7.35–7.45)
pH, Arterial: 7.275 — ABNORMAL LOW (ref 7.35–7.45)
pO2, Arterial: 106 mmHg (ref 83–108)
pO2, Arterial: 126 mmHg — ABNORMAL HIGH (ref 83–108)

## 2022-11-01 LAB — I-STAT VENOUS BLOOD GAS, ED
Acid-base deficit: 9 mmol/L — ABNORMAL HIGH (ref 0.0–2.0)
Bicarbonate: 20 mmol/L (ref 20.0–28.0)
Calcium, Ion: 1.18 mmol/L (ref 1.15–1.40)
HCT: 38 % (ref 36.0–46.0)
Hemoglobin: 12.9 g/dL (ref 12.0–15.0)
O2 Saturation: 60 %
Potassium: 3 mmol/L — ABNORMAL LOW (ref 3.5–5.1)
Sodium: 143 mmol/L (ref 135–145)
TCO2: 22 mmol/L (ref 22–32)
pCO2, Ven: 54.4 mmHg (ref 44–60)
pH, Ven: 7.172 — CL (ref 7.25–7.43)
pO2, Ven: 40 mmHg (ref 32–45)

## 2022-11-01 LAB — CBC WITH DIFFERENTIAL/PLATELET
Abs Immature Granulocytes: 0 10*3/uL (ref 0.00–0.07)
Basophils Absolute: 0.1 10*3/uL (ref 0.0–0.1)
Basophils Relative: 1 %
Eosinophils Absolute: 0 10*3/uL (ref 0.0–0.5)
Eosinophils Relative: 0 %
HCT: 38.9 % (ref 36.0–46.0)
Hemoglobin: 12.1 g/dL (ref 12.0–15.0)
Lymphocytes Relative: 69 %
Lymphs Abs: 9.7 10*3/uL — ABNORMAL HIGH (ref 0.7–4.0)
MCH: 27.6 pg (ref 26.0–34.0)
MCHC: 31.1 g/dL (ref 30.0–36.0)
MCV: 88.8 fL (ref 80.0–100.0)
Monocytes Absolute: 0.4 10*3/uL (ref 0.1–1.0)
Monocytes Relative: 3 %
Neutro Abs: 3.8 10*3/uL (ref 1.7–7.7)
Neutrophils Relative %: 27 %
Platelets: 232 10*3/uL (ref 150–400)
RBC: 4.38 MIL/uL (ref 3.87–5.11)
RDW: 14.9 % (ref 11.5–15.5)
WBC: 14 10*3/uL — ABNORMAL HIGH (ref 4.0–10.5)
nRBC: 0 /100 WBC
nRBC: 0.1 % (ref 0.0–0.2)

## 2022-11-01 LAB — TROPONIN I (HIGH SENSITIVITY)
Troponin I (High Sensitivity): 27 ng/L — ABNORMAL HIGH (ref <18)
Troponin I (High Sensitivity): 99 ng/L — ABNORMAL HIGH (ref <18)
Troponin I (High Sensitivity): 178 ng/L (ref <18)

## 2022-11-01 LAB — POCT I-STAT EG7
Acid-base deficit: 7 mmol/L — ABNORMAL HIGH (ref 0.0–2.0)
Bicarbonate: 21.8 mmol/L (ref 20.0–28.0)
Calcium, Ion: 1.29 mmol/L (ref 1.15–1.40)
HCT: 41 % (ref 36.0–46.0)
Hemoglobin: 13.9 g/dL (ref 12.0–15.0)
O2 Saturation: 56 %
Patient temperature: 36.6
Potassium: 4.4 mmol/L (ref 3.5–5.1)
Sodium: 140 mmol/L (ref 135–145)
TCO2: 23 mmol/L (ref 22–32)
pCO2, Ven: 54.1 mmHg (ref 44–60)
pH, Ven: 7.21 — ABNORMAL LOW (ref 7.25–7.43)
pO2, Ven: 35 mmHg (ref 32–45)

## 2022-11-01 LAB — GLUCOSE, CAPILLARY
Glucose-Capillary: 118 mg/dL — ABNORMAL HIGH (ref 70–99)
Glucose-Capillary: 98 mg/dL (ref 70–99)
Glucose-Capillary: 80 mg/dL (ref 70–99)
Glucose-Capillary: 84 mg/dL (ref 70–99)
Glucose-Capillary: 173 mg/dL — ABNORMAL HIGH (ref 70–99)

## 2022-11-01 LAB — I-STAT ARTERIAL BLOOD GAS, ED
Acid-base deficit: 8 mmol/L — ABNORMAL HIGH (ref 0.0–2.0)
Bicarbonate: 18.7 mmol/L — ABNORMAL LOW (ref 20.0–28.0)
Calcium, Ion: 1.22 mmol/L (ref 1.15–1.40)
HCT: 37 % (ref 36.0–46.0)
Hemoglobin: 12.6 g/dL (ref 12.0–15.0)
O2 Saturation: 96 %
Patient temperature: 97.9
Potassium: 2.6 mmol/L — CL (ref 3.5–5.1)
Sodium: 142 mmol/L (ref 135–145)
TCO2: 20 mmol/L — ABNORMAL LOW (ref 22–32)
pCO2 arterial: 41.8 mmHg (ref 32–48)
pH, Arterial: 7.256 — ABNORMAL LOW (ref 7.35–7.45)
pO2, Arterial: 90 mmHg (ref 83–108)

## 2022-11-01 LAB — ECHOCARDIOGRAM COMPLETE
Area-P 1/2: 6.12 cm2
Est EF: 25
Height: 66 in
MV M vel: 3.57 m/s
MV Peak grad: 51 mmHg
Radius: 0.2 cm
S' Lateral: 5.7 cm
Single Plane A4C EF: 20.8 %
Weight: 4197.56 oz

## 2022-11-01 LAB — PHOSPHORUS: Phosphorus: 3.3 mg/dL (ref 2.5–4.6)

## 2022-11-01 LAB — BASIC METABOLIC PANEL
Anion gap: 11 (ref 5–15)
BUN: 11 mg/dL (ref 6–20)
CO2: 21 mmol/L — ABNORMAL LOW (ref 22–32)
Calcium: 8.3 mg/dL — ABNORMAL LOW (ref 8.9–10.3)
Chloride: 107 mmol/L (ref 98–111)
Creatinine, Ser: 1.21 mg/dL — ABNORMAL HIGH (ref 0.44–1.00)
GFR, Estimated: 55 mL/min — ABNORMAL LOW (ref >60)
Glucose, Bld: 151 mg/dL — ABNORMAL HIGH (ref 70–99)
Potassium: 4.8 mmol/L (ref 3.5–5.1)
Sodium: 139 mmol/L (ref 135–145)

## 2022-11-01 LAB — COOXEMETRY PANEL
Carboxyhemoglobin: <0.3 % — ABNORMAL LOW (ref 0.5–1.5)
Methemoglobin: <0.7 % (ref 0.0–1.5)
O2 Saturation: 69.4 %
Total hemoglobin: 13.2 g/dL (ref 12.0–16.0)

## 2022-11-01 LAB — RAPID URINE DRUG SCREEN, HOSP PERFORMED
Amphetamines: NOT DETECTED
Barbiturates: NOT DETECTED
Benzodiazepines: POSITIVE — AB
Cocaine: NOT DETECTED
Opiates: NOT DETECTED
Tetrahydrocannabinol: NOT DETECTED

## 2022-11-01 LAB — I-STAT CHEM 8, ED
BUN: 8 mg/dL (ref 6–20)
Calcium, Ion: 1.16 mmol/L (ref 1.15–1.40)
Chloride: 106 mmol/L (ref 98–111)
Creatinine, Ser: 1 mg/dL (ref 0.44–1.00)
Glucose, Bld: 166 mg/dL — ABNORMAL HIGH (ref 70–99)
HCT: 37 % (ref 36.0–46.0)
Hemoglobin: 12.6 g/dL (ref 12.0–15.0)
Potassium: 2.9 mmol/L — ABNORMAL LOW (ref 3.5–5.1)
Sodium: 143 mmol/L (ref 135–145)
TCO2: 22 mmol/L (ref 22–32)

## 2022-11-01 LAB — URINALYSIS, ROUTINE W REFLEX MICROSCOPIC
Bilirubin Urine: NEGATIVE
Glucose, UA: 50 mg/dL — AB
Ketones, ur: NEGATIVE mg/dL
Leukocytes,Ua: NEGATIVE
Nitrite: NEGATIVE
Protein, ur: >=300 mg/dL — AB
RBC / HPF: >50 RBC/hpf (ref 0–5)
Specific Gravity, Urine: 1.007 (ref 1.005–1.030)
pH: 6 (ref 5.0–8.0)

## 2022-11-01 LAB — MAGNESIUM: Magnesium: 2.1 mg/dL (ref 1.7–2.4)

## 2022-11-01 MED ORDER — DOCUSATE SODIUM 50 MG/5ML PO LIQD
100.0000 mg | Freq: Two times a day (BID) | ORAL | Status: DC
  Administered 2022-11-01 – 2022-11-03 (×6): 100 mg
  Filled 2022-11-01 (×17): qty 10

## 2022-11-01 MED ORDER — HEPARIN SODIUM (PORCINE) 5000 UNIT/ML IJ SOLN
5000.0000 [IU] | Freq: Three times a day (TID) | INTRAMUSCULAR | Status: DC
  Administered 2022-11-01 – 2022-11-06 (×16): 5000 [IU] via SUBCUTANEOUS
  Filled 2022-11-01 (×16): qty 1

## 2022-11-01 MED ORDER — FENTANYL CITRATE PF 50 MCG/ML IJ SOSY: 50.0000 ug | PREFILLED_SYRINGE | Freq: Once | INTRAMUSCULAR | Status: DC

## 2022-11-01 MED ORDER — MAGNESIUM SULFATE 2 GM/50ML IV SOLN
2.0000 g | Freq: Once | INTRAVENOUS | Status: AC
  Administered 2022-11-01: 2 g via INTRAVENOUS
  Filled 2022-11-01: qty 50

## 2022-11-01 MED ORDER — MIDAZOLAM-SODIUM CHLORIDE 100-0.9 MG/100ML-% IV SOLN
0.5000 mg/h | INTRAVENOUS | Status: DC
  Administered 2022-11-01 (×2): 2 mg/h via INTRAVENOUS
  Filled 2022-11-01 (×2): qty 100

## 2022-11-01 MED ORDER — SODIUM CHLORIDE 0.9 % IV SOLN: INTRAVENOUS | Status: DC | PRN

## 2022-11-01 MED ORDER — POLYETHYLENE GLYCOL 3350 17 G PO PACK
17.0000 g | PACK | Freq: Every day | ORAL | Status: DC
  Administered 2022-11-01 – 2022-11-03 (×3): 17 g
  Filled 2022-11-01 (×7): qty 1

## 2022-11-01 MED ORDER — ETOMIDATE 2 MG/ML IV SOLN
INTRAVENOUS | Status: AC | PRN
  Administered 2022-11-01: 20 mg via INTRAVENOUS

## 2022-11-01 MED ORDER — MIDAZOLAM HCL 2 MG/2ML IJ SOLN
2.0000 mg | Freq: Once | INTRAMUSCULAR | Status: AC
  Administered 2022-11-01: 2 mg via INTRAVENOUS

## 2022-11-01 MED ORDER — AMIODARONE HCL IN DEXTROSE 360-4.14 MG/200ML-% IV SOLN
60.0000 mg/h | INTRAVENOUS | Status: AC
  Administered 2022-11-01 (×2): 60 mg/h via INTRAVENOUS

## 2022-11-01 MED ORDER — CHLORHEXIDINE GLUCONATE CLOTH 2 % EX PADS
6.0000 | MEDICATED_PAD | Freq: Every day | CUTANEOUS | Status: DC
  Administered 2022-11-01 – 2022-11-09 (×9): 6 via TOPICAL

## 2022-11-01 MED ORDER — ACETAMINOPHEN 160 MG/5ML PO SOLN
650.0000 mg | ORAL | Status: DC | PRN
  Administered 2022-11-04 – 2022-11-05 (×3): 650 mg
  Filled 2022-11-01 (×3): qty 20.3

## 2022-11-01 MED ORDER — ACETAMINOPHEN 650 MG RE SUPP: 650.0000 mg | RECTAL | Status: DC | PRN

## 2022-11-01 MED ORDER — SODIUM CHLORIDE 0.9 % IV SOLN
INTRAVENOUS | Status: AC | PRN
  Administered 2022-11-01: 999 mL/h via INTRAVENOUS

## 2022-11-01 MED ORDER — THIAMINE HCL 100 MG/ML IJ SOLN
100.0000 mg | Freq: Every day | INTRAMUSCULAR | Status: DC
  Administered 2022-11-01 – 2022-11-04 (×4): 100 mg via INTRAVENOUS
  Filled 2022-11-01 (×4): qty 2

## 2022-11-01 MED ORDER — POTASSIUM CHLORIDE 10 MEQ/50ML IV SOLN: 10.0000 meq | Freq: Once | INTRAVENOUS | Status: AC

## 2022-11-01 MED ORDER — AMIODARONE HCL IN DEXTROSE 360-4.14 MG/200ML-% IV SOLN
30.0000 mg/h | INTRAVENOUS | Status: DC
  Administered 2022-11-01 – 2022-11-02 (×4): 30 mg/h via INTRAVENOUS
  Filled 2022-11-01 (×4): qty 200

## 2022-11-01 MED ORDER — VITAL AF 1.2 CAL PO LIQD
1000.0000 mL | ORAL | Status: DC
  Administered 2022-11-01: 1000 mL

## 2022-11-01 MED ORDER — FENTANYL CITRATE PF 50 MCG/ML IJ SOSY
PREFILLED_SYRINGE | INTRAMUSCULAR | Status: AC
  Administered 2022-11-01: 100 ug
  Filled 2022-11-01: qty 2

## 2022-11-01 MED ORDER — ACETAMINOPHEN 325 MG PO TABS
650.0000 mg | ORAL_TABLET | ORAL | Status: DC | PRN
  Administered 2022-11-01 – 2022-11-05 (×3): 650 mg via ORAL
  Filled 2022-11-01 (×6): qty 2

## 2022-11-01 MED ORDER — POTASSIUM CHLORIDE 20 MEQ PO PACK
40.0000 meq | PACK | Freq: Once | ORAL | Status: AC
  Administered 2022-11-01: 40 meq
  Filled 2022-11-01: qty 2

## 2022-11-01 MED ORDER — INSULIN ASPART 100 UNIT/ML IJ SOLN
0.0000 [IU] | INTRAMUSCULAR | Status: DC
  Administered 2022-11-01: 3 [IU] via SUBCUTANEOUS
  Administered 2022-11-02 (×3): 2 [IU] via SUBCUTANEOUS
  Administered 2022-11-02: 3 [IU] via SUBCUTANEOUS
  Administered 2022-11-02: 2 [IU] via SUBCUTANEOUS

## 2022-11-01 MED ORDER — SODIUM CHLORIDE 0.9% FLUSH
10.0000 mL | INTRAVENOUS | Status: DC | PRN
  Administered 2022-11-06: 10 mL

## 2022-11-01 MED ORDER — POTASSIUM CHLORIDE 10 MEQ/100ML IV SOLN
10.0000 meq | INTRAVENOUS | Status: AC
  Administered 2022-11-01 (×3): 10 meq via INTRAVENOUS
  Filled 2022-11-01 (×4): qty 100

## 2022-11-01 MED ORDER — ORAL CARE MOUTH RINSE
15.0000 mL | OROMUCOSAL | Status: DC
  Administered 2022-11-02 – 2022-11-03 (×22): 15 mL via OROMUCOSAL

## 2022-11-01 MED ORDER — POTASSIUM CHLORIDE 10 MEQ/50ML IV SOLN
INTRAVENOUS | Status: AC
  Filled 2022-11-01: qty 50

## 2022-11-01 MED ORDER — SODIUM CHLORIDE 0.9 % IV SOLN
2.0000 g | INTRAVENOUS | Status: DC
  Administered 2022-11-01 – 2022-11-04 (×4): 2 g via INTRAVENOUS
  Filled 2022-11-01 (×4): qty 20

## 2022-11-01 MED ORDER — NOREPINEPHRINE 4 MG/250ML-% IV SOLN
0.0000 ug/min | INTRAVENOUS | Status: DC
  Administered 2022-11-01: 4 ug/min via INTRAVENOUS
  Administered 2022-11-01: 2 ug/min via INTRAVENOUS
  Administered 2022-11-02: 5 ug/min via INTRAVENOUS
  Administered 2022-11-04: 1 ug/min via INTRAVENOUS
  Filled 2022-11-01 (×4): qty 250

## 2022-11-01 MED ORDER — FENTANYL 2500MCG IN NS 250ML (10MCG/ML) PREMIX INFUSION
50.0000 ug/h | INTRAVENOUS | Status: DC
  Administered 2022-11-01: 100 ug/h via INTRAVENOUS
  Filled 2022-11-01: qty 250

## 2022-11-01 MED ORDER — FAMOTIDINE 20 MG PO TABS
20.0000 mg | ORAL_TABLET | Freq: Two times a day (BID) | ORAL | Status: DC
  Administered 2022-11-01 – 2022-11-05 (×9): 20 mg
  Filled 2022-11-01 (×9): qty 1

## 2022-11-01 MED ORDER — FENTANYL 2500MCG IN NS 250ML (10MCG/ML) PREMIX INFUSION
0.0000 ug/h | INTRAVENOUS | Status: DC
  Administered 2022-11-01: 25 ug/h via INTRAVENOUS
  Filled 2022-11-01: qty 250

## 2022-11-01 MED ORDER — MIDAZOLAM HCL 2 MG/2ML IJ SOLN
INTRAMUSCULAR | Status: AC
  Filled 2022-11-01: qty 2

## 2022-11-01 MED ORDER — SUCCINYLCHOLINE CHLORIDE 20 MG/ML IJ SOLN
INTRAMUSCULAR | Status: AC | PRN
  Administered 2022-11-01: 100 mg via INTRAVENOUS

## 2022-11-01 MED ORDER — SODIUM CHLORIDE 0.9% FLUSH
10.0000 mL | Freq: Two times a day (BID) | INTRAVENOUS | Status: DC
  Administered 2022-11-01 – 2022-11-04 (×7): 10 mL
  Administered 2022-11-05: 30 mL
  Administered 2022-11-05 – 2022-11-08 (×5): 10 mL

## 2022-11-01 MED ORDER — FENTANYL BOLUS VIA INFUSION
50.0000 ug | INTRAVENOUS | Status: DC | PRN
  Administered 2022-11-02: 50 ug via INTRAVENOUS

## 2022-11-01 MED ORDER — METRONIDAZOLE 500 MG/100ML IV SOLN
500.0000 mg | Freq: Two times a day (BID) | INTRAVENOUS | Status: DC
  Administered 2022-11-01 – 2022-11-04 (×6): 500 mg via INTRAVENOUS
  Filled 2022-11-01 (×6): qty 100

## 2022-11-01 MED ORDER — POTASSIUM CHLORIDE 10 MEQ/50ML IV SOLN
INTRAVENOUS | Status: AC
  Administered 2022-11-01: 10 meq via INTRAVENOUS
  Filled 2022-11-01: qty 50

## 2022-11-01 MED ORDER — ORAL CARE MOUTH RINSE: 15.0000 mL | OROMUCOSAL | Status: DC | PRN

## 2022-11-01 MED ORDER — AMIODARONE LOAD VIA INFUSION
150.0000 mg | Freq: Once | INTRAVENOUS | Status: AC
  Administered 2022-11-01: 150 mg via INTRAVENOUS
  Filled 2022-11-01: qty 83.34

## 2022-11-01 MED ORDER — EPINEPHRINE HCL 5 MG/250ML IV SOLN IN NS
0.5000 ug/min | INTRAVENOUS | Status: DC
  Administered 2022-11-01: 4 ug/min via INTRAVENOUS

## 2022-11-01 MED ORDER — ONDANSETRON HCL 4 MG/2ML IJ SOLN: 4.0000 mg | Freq: Four times a day (QID) | INTRAMUSCULAR | Status: DC | PRN

## 2022-11-01 NOTE — Progress Notes (Signed)
EEG complete - results pending 

## 2022-11-01 NOTE — Progress Notes (Signed)
  Echocardiogram 2D Echocardiogram has been performed.  Karen Lutz 11/01/2022, 2:50 PM

## 2022-11-01 NOTE — Progress Notes (Signed)
RT NOTE:  Pt transported to CT and back to ER without event 

## 2022-11-01 NOTE — Progress Notes (Signed)
CSW received consult to identify POC for patient. CSW called patients sister Odette Horns who informed CSW that she is main point of contact for patient. CSW informed MD.

## 2022-11-01 NOTE — ED Notes (Signed)
Pt transported to CT on monitor with RT and RN ?
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
## 2022-11-01 NOTE — ED Provider Notes (Signed)
[place] Provider Note   CSN: [id number] Arrival date & time: 11/01/22  0242     History  Chief Complaint  Patient presents with   Cardiac Arrest    Karen Lutz is a 52 y.o. female.  Patient brought to the emergency department by ambulance from home.  Patient's boyfriend called 911.  He reports that they were watching TV and fell asleep.  He tried to wake her but she would not wake up.  He called EMS who found the patient in V-fib.  Patient was shocked at which time she went briefly into PEA then V. tach, received a second shock which converted her to sinus rhythm.  An Igel airway was placed and she was assisted during transport.  She is reportedly breathing on her own and agitated at arrival.  Patient was hypotensive once ROSC was achieved, started on epi drip.  She was given Versed for her agitation.  Patient also received 3 rounds of epinephrine and 2 mg of Narcan prehospital.       Home Medications Prior to Admission medications   Medication Sig Start Date End Date Taking? Authorizing Provider  fluconazole (DIFLUCAN) 150 MG tablet Take 1 tablet (150 mg total) by mouth once a week. 07/23/22   Jaynee Eagles, PA-C  oseltamivir (TAMIFLU) 75 MG capsule Take 1 capsule (75 mg total) by mouth 2 (two) times daily. 07/23/22   Jaynee Eagles, PA-C  pantoprazole (PROTONIX) 40 MG tablet Take 1 tablet (40 mg total) by mouth daily. 05/04/21 06/03/21  McDonald, Mia A, PA-C  predniSONE (DELTASONE) 50 MG tablet Take 1 tablet (50 mg total) by mouth daily with breakfast. 07/23/22   Jaynee Eagles, PA-C  promethazine-dextromethorphan (PROMETHAZINE-DM) 6.25-15 MG/5ML syrup Take 5 mLs by mouth 4 (four) times daily as needed for cough. 07/23/22   Jaynee Eagles, PA-C  sucralfate (CARAFATE) 1 g tablet Take 1 tablet (1 g total) by mouth 4 (four) times daily. 05/04/21 06/03/21  McDonald, Mia A, PA-C  albuterol (VENTOLIN HFA) 108 (90 Base) MCG/ACT inhaler Inhale 2  puffs into the lungs every 4 (four) hours as needed for wheezing or shortness of breath. Patient not taking: Reported on 10/30/2020 09/08/20 11/24/20  Hans Eden, NP      Allergies    Penicillins and Penicillin g    Review of Systems   Review of Systems  Physical Exam Updated Vital Signs BP 105/74   Pulse 90   Temp 97.9 F (36.6 C)   Resp (!) 26   Ht 5\' 6"  (1.676 m)   LMP  (LMP Unknown)   SpO2 100%   BMI 48.10 kg/m  Physical Exam Vitals and nursing note reviewed.  Constitutional:      General: She is not in acute distress.    Appearance: She is well-developed.  HENT:     Head: Normocephalic and atraumatic.     Mouth/Throat:     Mouth: Mucous membranes are moist.  Eyes:     Comments: Disconjugate gaze  Cardiovascular:     Rate and Rhythm: Regular rhythm. Tachycardia present.     Pulses: Normal pulses.     Heart sounds: Normal heart sounds, S1 normal and S2 normal. No murmur heard.    No friction rub. No gallop.  Pulmonary:     Effort: Pulmonary effort is normal. No respiratory distress.     Breath sounds: Normal breath sounds.  Abdominal:     General: Bowel sounds are normal.  Palpations: Abdomen is soft.     Tenderness: There is no abdominal tenderness. There is no guarding or rebound.     Hernia: No hernia is present.  Musculoskeletal:        General: No swelling.     Cervical back: Full passive range of motion without pain, normal range of motion and neck supple. No spinous process tenderness or muscular tenderness. Normal range of motion.     Right lower leg: No edema.     Left lower leg: No edema.  Skin:    General: Skin is warm and dry.     Capillary Refill: Capillary refill takes less than 2 seconds.     Findings: No ecchymosis, erythema, rash or wound.  Neurological:     General: No focal deficit present.     Mental Status: She is unresponsive.     Comments: Slight gag present, no response to additional stimuli     ED Results / Procedures /  Treatments   Labs (all labs ordered are listed, but only abnormal results are displayed) Labs Reviewed  CBC WITH DIFFERENTIAL/PLATELET - Abnormal; Notable for the following components:      Result Value   WBC 14.0 (*)    Lymphs Abs 9.7 (*)    All other components within normal limits  COMPREHENSIVE METABOLIC PANEL - Abnormal; Notable for the following components:   Potassium 2.9 (*)    Glucose, Bld 175 (*)    Calcium 8.4 (*)    Total Protein 5.2 (*)    Albumin 2.7 (*)    AST 209 (*)    ALT 60 (*)    All other components within normal limits  URINALYSIS, ROUTINE W REFLEX MICROSCOPIC - Abnormal; Notable for the following components:   APPearance HAZY (*)    Glucose, UA 50 (*)    Hgb urine dipstick MODERATE (*)    Protein, ur >=300 (*)    Bacteria, UA FEW (*)    All other components within normal limits  I-STAT CHEM 8, ED - Abnormal; Notable for the following components:   Potassium 2.9 (*)    Glucose, Bld 166 (*)    All other components within normal limits  I-STAT VENOUS BLOOD GAS, ED - Abnormal; Notable for the following components:   pH, Ven 7.172 (*)    Acid-base deficit 9.0 (*)    Potassium 3.0 (*)    All other components within normal limits  I-STAT ARTERIAL BLOOD GAS, ED - Abnormal; Notable for the following components:   pH, Arterial 7.256 (*)    Bicarbonate 18.7 (*)    TCO2 20 (*)    Acid-base deficit 8.0 (*)    Potassium 2.6 (*)    All other components within normal limits  TROPONIN I (HIGH SENSITIVITY) - Abnormal; Notable for the following components:   Troponin I (High Sensitivity) 27 (*)    All other components within normal limits  RAPID URINE DRUG SCREEN, HOSP PERFORMED  TROPONIN I (HIGH SENSITIVITY)    EKG EKG Interpretation  Date/Time:  Thursday November 01 2022 02:49:40 EDT Ventricular Rate:  98 PR Interval:  188 QRS Duration: 110 QT Interval:  400 QTC Calculation: 511 R Axis:   -12 Text Interpretation: Sinus rhythm Incomplete left bundle branch  block Anterior Q waves, possibly due to ILBBB Prolonged QT interval Confirmed by Orpah Greek [phone]) on 11/01/2022 2:55:50 AM  Radiology CT HEAD WO CONTRAST (5MM)  Result Date: 11/01/2022 CLINICAL DATA:  Cardiac arrest. EXAM: CT HEAD WITHOUT CONTRAST  TECHNIQUE: Contiguous axial images were obtained from the base of the skull through the vertex without intravenous contrast. RADIATION DOSE REDUCTION: This exam was performed according to the departmental dose-optimization program which includes automated exposure control, adjustment of the mA and/or kV according to patient size and/or use of iterative reconstruction technique. COMPARISON:  03/12/2022 FINDINGS: Brain: No evidence of acute infarction, hemorrhage, hydrocephalus, extra-axial collection or mass lesion/mass effect. Vascular: No hyperdense vessel or unexpected calcification. Skull: Enlarged biparietal foramina, incidental and developmental. Sinuses/Orbits: No acute finding. Other: Multiple levels of streak artifact due to metal hardware. IMPRESSION: Negative head CT. Electronically Signed   By: Jorje Guild M.D.   On: 11/01/2022 04:23   DG Abd Portable 1 View  Result Date: 11/01/2022 CLINICAL DATA:  Check nasogastric catheter placement EXAM: PORTABLE ABDOMEN - 1 VIEW COMPARISON:  None Available. FINDINGS: Scattered large and small bowel gas is noted. Gastric catheter is noted in the distal stomach. No obstructive changes are seen. IMPRESSION: Gastric catheter within the stomach. Electronically Signed   By: Inez Catalina M.D.   On: 11/01/2022 03:07   DG Chest Port 1 View  Result Date: 11/01/2022 CLINICAL DATA:  Status post intubation EXAM: PORTABLE CHEST 1 VIEW COMPARISON:  06/16/2022 FINDINGS: Cardiac shadow is mildly prominent but accentuated by the portable technique. Endotracheal tube is noted at the level of the carina. This could be withdrawn 1-2 cm for improved positioning. Gastric catheter extends into the stomach. Lungs are  well aerated bilaterally. Increased central airspace opacity is noted particularly on the right likely related to pulmonary edema. IMPRESSION: Tubes and lines as described above. Endotracheal tube could be withdrawn slightly as described. Diffuse increased opacity is noted within the right lung likely representing edema. Electronically Signed   By: Inez Catalina M.D.   On: 11/01/2022 03:07    Procedures Procedure Name: Intubation Date/Time: 11/01/2022 3:04 AM  Performed by: Orpah Greek, MDPre-anesthesia Checklist: Patient identified, Patient being monitored, Emergency Drugs available, Timeout performed and Suction available Oxygen Delivery Method: Non-rebreather mask Preoxygenation: Pre-oxygenation with 100% oxygen Induction Type: Rapid sequence Ventilation: Mask ventilation without difficulty Laryngoscope Size: Glidescope and 4 Grade View: Grade I Tube size: 7.5 mm Number of attempts: 1 Placement Confirmation: ETT inserted through vocal cords under direct vision, CO2 detector and Breath sounds checked- equal and bilateral Secured at: 23 cm Tube secured with: ETT holder        Medications Ordered in ED Medications  fentaNYL 2562mcg in NS 252mL (44mcg/ml) infusion-PREMIX (250 mcg/hr Intravenous Rate/Dose Change 11/01/22 0403)  amiodarone (NEXTERONE) 1.8 mg/mL load via infusion 150 mg (150 mg Intravenous Bolus from Bag 11/01/22 0306)    Followed by  amiodarone (NEXTERONE PREMIX) 360-4.14 MG/200ML-% (1.8 mg/mL) IV infusion (60 mg/hr Intravenous New Bag/Given 11/01/22 0305)    Followed by  amiodarone (NEXTERONE PREMIX) 360-4.14 MG/200ML-% (1.8 mg/mL) IV infusion (has no administration in time range)  etomidate (AMIDATE) injection (20 mg Intravenous Given 11/01/22 0244)  succinylcholine (ANECTINE) injection (100 mg Intravenous Given 11/01/22 0245)  0.9 %  sodium chloride infusion (0 mLs Intravenous Stopped 11/01/22 0430)  EPINEPHrine (ADRENALIN) 5 mg in NS 250 mL (0.02 mg/mL) premix  infusion (2 mcg/min Intravenous Rate/Dose Change 11/01/22 0430)  midazolam (VERSED) 2 MG/2ML injection (  Not Given 11/01/22 0344)  midazolam (VERSED) 100 mg/100 mL (1 mg/mL) premix infusion (6 mg/hr Intravenous Rate/Dose Change 11/01/22 0428)  fentaNYL (SUBLIMAZE) 50 MCG/ML injection (100 mcg  Given 11/01/22 0258)  midazolam (VERSED) injection 2 mg (2 mg Intravenous Not Given  11/01/22 0329)  midazolam (VERSED) injection 2 mg (2 mg Intravenous Given 11/01/22 0341)    ED Course/ Medical Decision Making/ A&P                             Medical Decision Making Amount and/or Complexity of Data Reviewed Labs: ordered. Decision-making details documented in ED Course. Radiology: ordered and independent interpretation performed. Decision-making details documented in ED Course. ECG/medicine tests: ordered and independent interpretation performed. Decision-making details documented in ED Course.  Risk Prescription drug management.   Patient brought to the emergency department by EMS after cardiac arrest.  Differential diagnosis considered includes, but not limited to: Acute MI; primary cardiac arrhythmia; seizure; stroke; overdose  Patient was noted to be unresponsive by her boyfriend who called 911.  EMS reported the patient was in V-fib upon their arrival.  She was shocked and rhythm changed to V. tach, shocked again and she went into sinus rhythm with spontaneous circulation.  Patient hypotensive, epinephrine was initiated by EMS.  Patient arrives being bagged via Igel oral airway device.  This was switched to endotracheal tube after a brief trial to see if she would breathe on her own.  The patient was performed without difficulty.  She continues to be hypotensive at arrival, was continued on the epinephrine drip that was started by EMS.  Patient agitated, fighting the vent.  She required fentanyl and Versed for sedation.  Head CT without abnormality.  EKG at arrival does not show evidence of  MI.  She was placed on amiodarone drip to prevent further arrhythmia.  Blood work unremarkable.  Troponin not significantly elevated.  Critical care consulted to admit patient to ICU.  CRITICAL CARE Performed by: Orpah Greek   Total critical care time: 35 minutes  Critical care time was exclusive of separately billable procedures and treating other patients.  Critical care was necessary to treat or prevent imminent or life-threatening deterioration.  Critical care was time spent personally by me on the following activities: development of treatment plan with patient and/or surrogate as well as nursing, discussions with consultants, evaluation of patient's response to treatment, examination of patient, obtaining history from patient or surrogate, ordering and performing treatments and interventions, ordering and review of laboratory studies, ordering and review of radiographic studies, pulse oximetry and re-evaluation of patient's condition.         Final Clinical Impression(s) / ED Diagnoses Final diagnoses:  Cardiopulmonary arrest [facility])    Rx / DC Orders ED Discharge Orders     None         Chayden Garrelts, Gwenyth Allegra, MD 11/01/22 [phone]

## 2022-11-01 NOTE — Progress Notes (Signed)
Consult for USGPIV for vasopressor. No veins noted to either arm suitable for this purpose. PICC order in place. Primary RN notified.

## 2022-11-01 NOTE — Progress Notes (Signed)
RT NOTE:  Pt transported to [facility] [place] without event. Report given to Tim, RRT.

## 2022-11-01 NOTE — Progress Notes (Signed)
eLink Physician-Brief Progress Note Patient Name: Karen Lutz DOB: 1971-01-26 MRN: [id number]   Date of Service  11/01/2022  HPI/Events of Note  Patient admitted through through the ED with out of hospital cardiac  arrest, acute respiratory failure s/p intubation and mechanical ventilation, and altered mental status.  She is morbidly obese.   eICU Interventions  New Patient Evaluation.        Frederik Pear 11/01/2022, 5:45 AM

## 2022-11-01 NOTE — ED Triage Notes (Signed)
Pt to ED via EMS from home. Pt's boyfriend reports that they were watching TV and fell asleep and when he tried to wake her up she was unresponsive. When EMS arrived pt was in v-fib, EMS delivered one de fib, pt went into PEA then to V tach, EMS delivered 2nd de fib and pt went into SVT with HR of 150s to then ST. EMS did a total of 23 minutes of CPR. EMS started pt on epi gtt en route. Pupils constricted prior to narcan and sluggish post narcan.   EMS Meds: 3 rounds of epi 300 amiodarone  2 of narcan 50 of fentanyl 2.5 versed  20 L hand I.O. right tib

## 2022-11-01 NOTE — Progress Notes (Signed)
Peripherally Inserted Central Catheter Placement  The IV Nurse has discussed with the patient and/or persons authorized to consent for the patient, the purpose of this procedure and the potential benefits and risks involved with this procedure.  The benefits include less needle sticks, lab draws from the catheter, and the patient may be discharged home with the catheter. Risks include, but not limited to, infection, bleeding, blood clot (thrombus formation), and puncture of an artery; nerve damage and irregular heartbeat and possibility to perform a PICC exchange if needed/ordered by physician.  Alternatives to this procedure were also discussed.  B[employer] Power PICC patient education guide, fact sheet on infection prevention and patient information card has been provided to patient /or left at bedside.    PICC Placement Documentation  PICC Triple Lumen [id number] Right Cephalic 38 cm 1 cm (Active)  Indication for Insertion or Continuance of Line Vasoactive infusions 11/01/22 0917  Exposed Catheter (cm) 1 cm 11/01/22 0917  Site Assessment Clean, Dry, Intact 11/01/22 0917  Lumen #1 Status Flushed;Saline locked;Blood return noted 11/01/22 0917  Lumen #2 Status Flushed;Saline locked;Blood return noted 11/01/22 0917  Lumen #3 Status Flushed;Saline locked;Blood return noted 11/01/22 0917  Dressing Type Transparent;Securing device 11/01/22 0917  Dressing Status Antimicrobial disc in place;Clean, Dry, Intact 11/01/22 0917  Safety Lock Intact 11/01/22 0917  Line Adjustment (NICU/IV Team Only) No 11/01/22 0917  Dressing Intervention New dressing;Other (Comment) 11/01/22 0917  Dressing Change Due 11/08/22 11/01/22 0917   Daughter at bedside signed consent    Synthia Innocent 11/01/2022, 9:34 AM

## 2022-11-01 NOTE — Progress Notes (Signed)
ETT retracted by 2cm at the lip, bilateral breath sounds noted. RN made aware. Repeat CXR ordered.

## 2022-11-01 NOTE — Procedures (Signed)
Patient Name: Karen Lutz  MRN: [id number]  Epilepsy Attending: Lora Havens  Referring Physician/Provider: Corey Harold, NP  Date: 11/01/2022 Duration: 24.25 mins  Patient history: 52yo F s/p cardiac arrest. EEG to evaluate for seizure  Level of alertness: comatose  AEDs during EEG study: Versed  Technical aspects: This EEG study was done with scalp electrodes positioned according to the 10-20 International system of electrode placement. Electrical activity was reviewed with band pass filter of 1-70Hz , sensitivity of 7 uV/mm, display speed of 57mm/sec with a 60Hz  notched filter applied as appropriate. EEG data were recorded continuously and digitally stored.  Video monitoring was available and reviewed as appropriate.  Description: EEG showed continuous generalized background attenuation. Hyperventilation and photic stimulation were not performed.     ABNORMALITY - Background attenuation, generalized  IMPRESSION: This study is suggestive of profound diffuse encephalopathy, nonspecific etiology. No seizures or epileptiform discharges were seen throughout the recording.  Latifah Padin Barbra Sarks

## 2022-11-01 NOTE — ED Notes (Signed)
Lab results was reported to Nurse. 

## 2022-11-01 NOTE — H&P (Addendum)
NAME:  Karen Lutz, MRN:  [id number], DOB:  1970/10/21, LOS: 0 ADMISSION DATE:  11/01/2022, CONSULTATION DATE:  11/01/2022 REFERRING MD:  Dr. Betsey Holiday, ER, CHIEF COMPLAINT:  Cardiac arrest   History of Present Illness:  52 yo female with recent history of bariatric surgery. She was found by boyfriend unresponsive after falling asleep while watching TV.  EMS called and she was in VF.  Had defibrillation and then went into PEA and VT.  Had defibrillation again and went into SVT.  Given amiodarone and started on Epi gtt.  Given narcan.  About 23 minutes of CPR before ROSC.  Intubated in ER.  Potassium 2.9.  Dyssynchronous with the ventilator in the ED requiring sedation with fentanyl and versed. No wakefulness or purposeful behavior seen post arrest. Requiring epinephrine infusion. PCCM asked to admit to ICU.  Pertinent  Medical History  GERD, Obesity s/p bariatric surgery  Significant Hospital Events: Including procedures, antibiotic start and stop dates in addition to other pertinent events   3/28 Admit  Interim History / Subjective:    Objective   Blood pressure 102/70, pulse 95, temperature 97.9 F (36.6 C), temperature source Axillary, resp. rate (!) 26, height 5\' 6"  (1.676 m), SpO2 100 %.    Vent Mode: PRVC FiO2 (%):  [100 %] 100 % Set Rate:  [18 bmp] 18 bmp Vt Set:  [470 mL] 470 mL PEEP:  [5 cmH20] 5 cmH20 Plateau Pressure:  [29 cmH20] 29 cmH20  No intake or output data in the 24 hours ending 11/01/22 0410 There were no vitals filed for this visit.  Examination: General: Morbidly obese adult female on vent HENT: /AT, pupils pinpoint, no JVD Lungs: Coarse throughout Cardiovascular: RRR, no MRG Abdomen: Soft, Non-distended Extremities: No acute deformity, no edema.  Neuro: Unresponsive RASS -4 GU: Foley  Resolved Hospital Problem list     Assessment & Plan:   Cardiac arrest: Unknown downtime prior to EMS arrival. 23 mins CPR. VF initial rhythm. EKG not markedly  different from prior. CT head negative. - Admit to ICU - TTM normothermia protocol - EEG - Echocardiogram - Amiodarone infusion - Can involve cardiology depending on course  Shock: likely cardiogenic. S/p arrest. No evidence of bleeding or anaphylaxis. Mild leukocytosis, no fever.  - Echo - Epinephrine infusion weaning down quickly - Can consider CVL vs PICC if unable to wean off pressors.  - MAP goal 3mmHg  Acute respiratory failure with hypoxia: CXR showing R>L central opacification concerning for edema vs ALI. Cannot rule out PNA - Full vent support - Changed to pressure control for vent compliance.  - Target tidal volume 8cc/kg ideal body weight - VAP bundle - Diurese when we are able from a BP standpoint.   Hypokalemia - supp K and trend  QTC prolongation: mild.  - mag  Morbid obesity s/p bariatric surgery: Laparoscopic SADI-S with duodenal switch [id number]. - Uncomplicated post op course - Consider CT abdomen - Consult dietician for enteral feedings  Sister updated via phone  Best Practice (right click and "Reselect all SmartList Selections" daily)   Diet/type: NPO DVT prophylaxis: prophylactic heparin  GI prophylaxis: H2B Lines: N/A Foley:  Yes, and it is still needed Code Status:  full code Last date of multidisciplinary goals of care discussion [ ]   Labs   CBC: Recent Labs  Lab 11/01/22 0250 11/01/22 0254 11/01/22 0341  WBC 14.0*  --   --   NEUTROABS 3.8  --   --   HGB 12.1 12.6 12.9  HCT 38.9 37.0 38.0  MCV 88.8  --   --   PLT 232  --   --     Basic Metabolic Panel: Recent Labs  Lab 11/01/22 0250 11/01/22 0254 11/01/22 0341  NA 140 143 143  K 2.9* 2.9* 3.0*  CL 106 106  --   CO2 24  --   --   GLUCOSE 175* 166*  --   BUN 9 8  --   CREATININE 0.94 1.00  --   CALCIUM 8.4*  --   --    GFR: CrCl cannot be calculated (Unknown ideal weight.). Recent Labs  Lab 11/01/22 0250  WBC 14.0*    Liver Function Tests: Recent Labs  Lab  11/01/22 0250  AST 209*  ALT 60*  ALKPHOS 83  BILITOT 0.5  PROT 5.2*  ALBUMIN 2.7*   No results for input(s): "LIPASE", "AMYLASE" in the last 168 hours. No results for input(s): "AMMONIA" in the last 168 hours.  ABG    Component Value Date/Time   HCO3 20.0 11/01/2022 0341   TCO2 22 11/01/2022 0341   ACIDBASEDEF 9.0 (H) 11/01/2022 0341   O2SAT 60 11/01/2022 0341     Coagulation Profile: No results for input(s): "INR", "PROTIME" in the last 168 hours.  Cardiac Enzymes: No results for input(s): "CKTOTAL", "CKMB", "CKMBINDEX", "TROPONINI" in the last 168 hours.  HbA1C: Hgb A1c MFr Bld  Date/Time Value Ref Range Status  02/06/2011 03:38 PM 5.6 <5.7 % Final    Comment:    (NOTE)                                                                       According to the ADA Clinical Practice Recommendations for 2011, when HbA1c is used as a screening test:  >=6.5%   Diagnostic of Diabetes Mellitus           (if abnormal result is confirmed) 5.7-6.4%   Increased risk of developing Diabetes Mellitus References:Diagnosis and Classification of Diabetes Mellitus,Diabetes [id number] 1):S62-S69 and Standards of Medical Care in         Diabetes - 2011,Diabetes [id number] (Suppl 1):S11-S61.    CBG: No results for input(s): "GLUCAP" in the last 168 hours.  Review of Systems:   Patient is encephalopathic and/or intubated; therefore, history has been obtained from chart review.    Past Medical History:  She,  has a past medical history of Acid reflux, Anemia, Fibroids, Heart murmur, Knee pain, and UTI (lower urinary tract infection).   Surgical History:   Past Surgical History:  Procedure Laterality Date   ABDOMINAL HYSTERECTOMY     BARIATRIC SURGERY     [name] Single x1 week ago   ceaserian     CESAREAN SECTION     CESAREAN SECTION     KNEE SURGERY Left    KNEE SURGERY       Social History:   reports that she has never smoked. She has never used smokeless  tobacco. She reports that she does not drink alcohol and does not use drugs.   Family History:  Her family history includes Alcohol abuse in her father and mother; Arthritis in her maternal grandfather, maternal grandmother, paternal grandfather, and paternal grandmother; Cancer in an other family member; Heart  disease in her father and mother; Hypertension in an other family member.   Allergies Allergies  Allergen Reactions   Penicillins Rash    Has patient had a PCN reaction causing immediate rash, facial/tongue/throat swelling, SOB or lightheadedness with hypotension: unknown Has patient had a PCN reaction causing severe rash involving mucus membranes or skin necrosis: unknown Has patient had a PCN reaction that required hospitalization : yes Has patient had a PCN reaction occurring within the last 10 years: no If all of the above answers are "NO", then may proceed with Cephalosporin use.    Penicillin G Other (See Comments)    Per patient, childhood allergy, reaction unknown.       Home Medications  Prior to Admission medications   Medication Sig Start Date End Date Taking? Authorizing Provider  fluconazole (DIFLUCAN) 150 MG tablet Take 1 tablet (150 mg total) by mouth once a week. 07/23/22   Jaynee Eagles, PA-C  oseltamivir (TAMIFLU) 75 MG capsule Take 1 capsule (75 mg total) by mouth 2 (two) times daily. 07/23/22   Jaynee Eagles, PA-C  pantoprazole (PROTONIX) 40 MG tablet Take 1 tablet (40 mg total) by mouth daily. 05/04/21 06/03/21  McDonald, Mia A, PA-C  predniSONE (DELTASONE) 50 MG tablet Take 1 tablet (50 mg total) by mouth daily with breakfast. 07/23/22   Jaynee Eagles, PA-C  promethazine-dextromethorphan (PROMETHAZINE-DM) 6.25-15 MG/5ML syrup Take 5 mLs by mouth 4 (four) times daily as needed for cough. 07/23/22   Jaynee Eagles, PA-C  sucralfate (CARAFATE) 1 g tablet Take 1 tablet (1 g total) by mouth 4 (four) times daily. 05/04/21 06/03/21  McDonald, Mia A, PA-C  albuterol (VENTOLIN  HFA) 108 (90 Base) MCG/ACT inhaler Inhale 2 puffs into the lungs every 4 (four) hours as needed for wheezing or shortness of breath. Patient not taking: Reported on 10/30/2020 09/08/20 11/24/20  Hans Eden, NP     Critical care time: 42 minutes     Georgann Housekeeper, AGACNP-BC [facility]  See Amion for personal pager PCCM on call pager [phone] until 7pm. Please call Elink 7p-7a. [phone]  11/01/2022 5:14 AM

## 2022-11-01 NOTE — Progress Notes (Addendum)
NAME:  Karen Lutz, MRN:  [id number], DOB:  1970/09/24, LOS: 0 ADMISSION DATE:  11/01/2022, CONSULTATION DATE:  11/01/2022 REFERRING MD:  Dr. Betsey Holiday, ER, CHIEF COMPLAINT:  Cardiac arrest   History of Present Illness:  52 yo female with recent history of bariatric surgery. She was found by boyfriend unresponsive after falling asleep while watching TV.  EMS called and she was in VF.  Had defibrillation and then went into PEA and VT.  Had defibrillation again and went into SVT.  Given amiodarone and started on Epi gtt.  Given narcan.  About 23 minutes of CPR before ROSC.  Intubated in ER.  Potassium 2.9.  Dyssynchronous with the ventilator in the ED requiring sedation with fentanyl and versed. No wakefulness or purposeful behavior seen post arrest. Requiring epinephrine infusion. PCCM asked to admit to ICU.  Pertinent  Medical History  GERD, Obesity s/p bariatric surgery  Significant Hospital Events: Including procedures, antibiotic start and stop dates in addition to other pertinent events   3/28 Admit  Interim History / Subjective:  Daughter and pt's sister at bedside.  They have concerns for foul play with events leading up and after her arrest, with her boyfriend and have contacted police.  Also relay significant paternal cardiac hx.  Pt's father with 3 MI"s in this late 21s.  Do report patient has prior hx of cocaine abuse and possible ETOH abuse, but do not think daily use.   Febrile Off Epi but borderline MAP S/p PICC placement  Objective   Blood pressure 91/63, pulse 95, temperature (!) 100.9 F (38.3 C), resp. rate (!) 26, height 5\' 6"  (1.676 m), weight 119 kg, SpO2 100 %.    Vent Mode: PCV FiO2 (%):  [50 %-100 %] 50 % Set Rate:  [18 bmp-26 bmp] 26 bmp Vt Set:  [470 mL] 470 mL PEEP:  [5 cmH20-8 cmH20] 8 cmH20 Plateau Pressure:  [24 cmH20-29 cmH20] 25 cmH20   Intake/Output Summary (Last 24 hours) at 11/01/2022 1013 Last data filed at 11/01/2022 0700 Gross per 24 hour   Intake 1492.98 ml  Output 130 ml  Net 1362.98 ml   Filed Weights   11/01/22 0525  Weight: 119 kg   Examination: Fentanyl 200/ versed 6 General:  critically ill appearing adult female sedated in NAD on MV HEENT: MM pink/moist, pinpiont pupils, absent corneals, anicteric  Neuro: sedated CV: rr, NSR, no murmur PULM:  MV supported, flipped to PCV to PRVC with low Mve, scattered rhonchi, no wheeze GI: obese, soft, bs+, foley  Extremities: warm/dry, no LE edema  Skin: no rashes   Labs reviewed  Resolved Hospital Problem list     Assessment & Plan:   Shock, undifferentiated  - suspect myocardial stunning vs cardiogenic shock, no known infectious symptoms leading up to events VF cardiac arrest: Unknown downtime prior to EMS arrival. 23 mins CPR. VF initial rhythm. EKG not markedly different from prior. CT head negative. - suspect may be related to severe hypokalemia but given significant family hx, will need to be further cardiac workup - tele/ ICU monitoring - NE if vasopressor support needed, goal MAP > 65 - cont amio gtt - pending echo - check coox and trend CVP - cont TTM protocol> starting cooling measures  - place aline - cardiology consult pending neuro recovery - keep K> 4, Mag> 2, optimize electrolytes  - spike in leukocytosis today, suspected reactive and probable aspiration with increased secretion.  Send BC, follow trach asp, check PCT -  start empiric ceftriaxone/ flagyl.  MRSA PCR neg, hold on vanc.  - Given recent bariatric surgery 06/2022> consider CT a/p to rule out intra-abdominal process  - lab to hold initial ER bloodwork in the event of poor outcome/ ME case.   Acute respiratory failure with hypoxia: CXR showing R>L central opacification concerning for edema vs ALI. Cannot rule out PNA - change back to [facility] 4-8cc/kg IBW with goal Pplat <30 and DP<15 , given intermittent low TV's.  Monitor for further dyssynchrony/ vent compliance - PAD protocol for  sedation> minimize fentanyl and versed gtt - wean FiO2/ PEEP as able for SpO2 >92%  - follow trach asp - ABG after changing back to [facility] and repeat VBG - diurese when able - abx as above   Hypokalemia - additional KCL 40meg per tube w/ initial 4 runs.  Also given Mag 2gm.  Pending repeat K and Mag, trend daily.   QTC prolongation: mild.  - optimize electrolytes - tele montioring - avoid Qtc prolonging meds  Morbid obesity s/p bariatric surgery: Laparoscopic SADI-S with duodenal switch [id number]. - Uncomplicated post op course - Consider CT abdomen when stable - RD recs for cortrak given surgery to prevent vomiting and maximize nutritional support> ordered post-pyloric   Acute encephalopathy, ddx metabolic/ toxic, will need to rule out anoxic component - UDS positive for benzo's on admit - hx of cocaine use per family, and ETOH use - add empiric thiamine/ folate - neuro protective measures w/ TTM as above - CTH neg on admit - EEG neg - consider MRI if no improvement - supportive care as above, frequent reassessments   Best Practice (right click and "Reselect all SmartList Selections" daily)   Diet/type: NPO; TF when cortrak placed per RD DVT prophylaxis: prophylactic heparin  GI prophylaxis: H2B Lines: Central line- PICC Foley:  Yes, and it is still needed Code Status:  full code Last date of multidisciplinary goals of care discussion [ ]   Pt's daughter and sister at bedside. Updated on plan of care.  Pt's sister, Gayland Curry [phone] to remain the first contact/ decision maker.   Labs   CBC: Recent Labs  Lab 11/01/22 0250 11/01/22 0254 11/01/22 0341 11/01/22 0420 11/01/22 0938  WBC 14.0*  --   --   --   --   NEUTROABS 3.8  --   --   --   --   HGB 12.1 12.6 12.9 12.6 13.6  HCT 38.9 37.0 38.0 37.0 40.0  MCV 88.8  --   --   --   --   PLT 232  --   --   --   --     Basic Metabolic Panel: Recent Labs  Lab 11/01/22 0250 11/01/22 0254 11/01/22 0341  11/01/22 0420 11/01/22 0938  NA 140 143 143 142 138  K 2.9* 2.9* 3.0* 2.6* 3.5  CL 106 106  --   --   --   CO2 24  --   --   --   --   GLUCOSE 175* 166*  --   --   --   BUN 9 8  --   --   --   CREATININE 0.94 1.00  --   --   --   CALCIUM 8.4*  --   --   --   --    GFR: Estimated Creatinine Clearance: 88.4 mL/min (by C-G formula based on SCr of 1 mg/dL). Recent Labs  Lab 11/01/22 0250  WBC 14.0*    Liver  Function Tests: Recent Labs  Lab 11/01/22 0250  AST 209*  ALT 60*  ALKPHOS 83  BILITOT 0.5  PROT 5.2*  ALBUMIN 2.7*   No results for input(s): "LIPASE", "AMYLASE" in the last 168 hours. No results for input(s): "AMMONIA" in the last 168 hours.  ABG    Component Value Date/Time   PHART 7.296 (L) 11/01/2022 0938   PCO2ART 41.3 11/01/2022 0938   PO2ART 106 11/01/2022 0938   HCO3 19.8 (L) 11/01/2022 0938   TCO2 21 (L) 11/01/2022 0938   ACIDBASEDEF 6.0 (H) 11/01/2022 0938   O2SAT 97 11/01/2022 0938     Coagulation Profile: No results for input(s): "INR", "PROTIME" in the last 168 hours.  Cardiac Enzymes: No results for input(s): "CKTOTAL", "CKMB", "CKMBINDEX", "TROPONINI" in the last 168 hours.  HbA1C: Hgb A1c MFr Bld  Date/Time Value Ref Range Status  02/06/2011 03:38 PM 5.6 <5.7 % Final    Comment:    (NOTE)                                                                       According to the ADA Clinical Practice Recommendations for 2011, when HbA1c is used as a screening test:  >=6.5%   Diagnostic of Diabetes Mellitus           (if abnormal result is confirmed) 5.7-6.4%   Increased risk of developing Diabetes Mellitus References:Diagnosis and Classification of Diabetes Mellitus,Diabetes [id number] 1):S62-S69 and Standards of Medical Care in         Diabetes - 2011,Diabetes [id number] (Suppl 1):S11-S61.    CBG: Recent Labs  Lab 11/01/22 0624 11/01/22 0758  GLUCAP 118* 98     Critical care time: 17 minutes      Kennieth Rad,  MSN, AG-ACNP-BC [facility] 11/01/2022, 10:14 AM  See Amion for pager If no response to pager, please call PCCM consult pager After 7:00 pm call [place]      Attending attestation: Ms. Moreira is a 52 y/o woman with a history of obesity, s/p bariatric surgery in 11/23, GERD who presented with VF arrest after being found unresponsive by her boyfriend. She had abdominal pain and went to lay down, and later her boyfriend was unable to arouse her when he went to check on her. With EMS she had VF and resuscitation was started. She had about 20 min of resuscitation with various rhythms during her resuscitation. Today she is on artic sun to prevent fevers.   BP 90/73   Pulse 79   Temp 98.4 F (36.9 C)   Resp (!) 24   Ht 5\' 6"  (1.676 m)   Wt 119 kg   LMP  (LMP Unknown)   SpO2 100%   BMI 42.34 kg/m  Critically ill appearing woman lying in bed intubated, sedated on fentanyl & versed.  [place]/AT, eyes anicteric, ETT S1-S2, regular rate and rhythm Rhonchi bilaterally, minimal endotracheal secretions. Abdomen obese, soft, nontender No peripheral edema, no clubbing or cyanosis Pinpoint pupils, negative doll's eyes and corneals.  No gag, +  cough reflex.  Not withdrawing from pain in any extremity.  No response to verbal stimulation.  CT head reviewed-possibly early loss of gray-white differentiation, no herniation.  No bleeding. 7.3/43/126/20 Bicarb 21 BUN  11 Creatinine 1.2 Troponin 178 Echo: LVEF 25%, global hypokinesis, worse in apex and septum.  Grade 2 diastolic dysfunction.  Normal RV size and function.  Moderately dilated LA.  Mild MR.  Normal IVC. CXR personally reviewed-medial predominant opacities, bilateral increased opacification.  Endotracheal tube at the carina  Assessment & plan: Cardiac arrest, V-fib.  Unknown etiology.  Hypokalemia and prolonged QT on EKG could be contributing--these both seem acute since her November workup for surgery. Acute HFrEF-unknown  chronicity - Supportive care - Monitor on telemetry - Monitor electrolytes and replete as needed - Repeat EKG tomorrow - Prevent fevers -Amiodarone  Troponin elevation rather low, likely due to chest compressions - No additional monitoring needed  Acute respiratory failure with hypoxia, ARDS.  Concern for aspiration pneumonitis. - Empiric antibiotics - LTV V - VAP prevention protocol - PAD protocol for sedation - Daily SAT and SBT as appropriate.  Vent requirements too high today.  Work on decreasing PEEP. - Retract endotracheal tube 2 cm.  Concern for anoxic brain injury - Prevent fevers, supportive care - EEG - Neuro prognosticate at 72 hours. - Sedation per protocol  Hyperglycemia - Sliding scale insulin as needed - Goal blood glucose 140-180  Shock - Norepi to maintain MAP greater than 65 -empiric antibiotics  At risk for malnutrition, especially with previous bariatric surgery -TF -vitamins  Family updated at bedside.   This patient is critically ill with multiple organ system failure which requires frequent high complexity decision making, assessment, support, evaluation, and titration of therapies. This was completed through the application of advanced monitoring technologies and extensive interpretation of multiple databases. During this encounter critical care time was devoted to patient care services described in this note for 46 minutes.  Julian Hy, DO 11/01/22 6:22 PM [facility]  For contact information, see Amion. If no response to pager, please call PCCM consult pager. After hours, 7PM- 7AM, please call El[place].

## 2022-11-02 ENCOUNTER — Inpatient Hospital Stay (HOSPITAL_COMMUNITY): Payer: BLUE CROSS/BLUE SHIELD

## 2022-11-02 DIAGNOSIS — I469 Cardiac arrest, cause unspecified: Secondary | ICD-10-CM | POA: Diagnosis not present

## 2022-11-02 LAB — HEPATIC FUNCTION PANEL
ALT: 44 U/L (ref 0–44)
AST: 50 U/L — ABNORMAL HIGH (ref 15–41)
Albumin: 2.5 g/dL — ABNORMAL LOW (ref 3.5–5.0)
Alkaline Phosphatase: 83 U/L (ref 38–126)
Bilirubin, Direct: 0.2 mg/dL (ref 0.0–0.2)
Indirect Bilirubin: 0.5 mg/dL (ref 0.3–0.9)
Total Bilirubin: 0.7 mg/dL (ref 0.3–1.2)
Total Protein: 5.3 g/dL — ABNORMAL LOW (ref 6.5–8.1)

## 2022-11-02 LAB — CBC
HCT: 38.2 % (ref 36.0–46.0)
Hemoglobin: 12.3 g/dL (ref 12.0–15.0)
MCH: 27.5 pg (ref 26.0–34.0)
MCHC: 32.2 g/dL (ref 30.0–36.0)
MCV: 85.5 fL (ref 80.0–100.0)
Platelets: 172 10*3/uL (ref 150–400)
RBC: 4.47 MIL/uL (ref 3.87–5.11)
RDW: 15.5 % (ref 11.5–15.5)
WBC: 13 10*3/uL — ABNORMAL HIGH (ref 4.0–10.5)
nRBC: 0 % (ref 0.0–0.2)

## 2022-11-02 LAB — GLUCOSE, CAPILLARY
Glucose-Capillary: 107 mg/dL — ABNORMAL HIGH (ref 70–99)
Glucose-Capillary: 123 mg/dL — ABNORMAL HIGH (ref 70–99)
Glucose-Capillary: 140 mg/dL — ABNORMAL HIGH (ref 70–99)
Glucose-Capillary: 147 mg/dL — ABNORMAL HIGH (ref 70–99)
Glucose-Capillary: 149 mg/dL — ABNORMAL HIGH (ref 70–99)
Glucose-Capillary: 175 mg/dL — ABNORMAL HIGH (ref 70–99)
Glucose-Capillary: 78 mg/dL (ref 70–99)

## 2022-11-02 LAB — COOXEMETRY PANEL
Carboxyhemoglobin: 0.6 % (ref 0.5–1.5)
Carboxyhemoglobin: 1.1 % (ref 0.5–1.5)
Methemoglobin: <0.7 % (ref 0.0–1.5)
Methemoglobin: <0.7 % (ref 0.0–1.5)
O2 Saturation: 74.7 %
O2 Saturation: 76.3 %
Total hemoglobin: 12.5 g/dL (ref 12.0–16.0)
Total hemoglobin: 12.4 g/dL (ref 12.0–16.0)

## 2022-11-02 LAB — POCT I-STAT 7, (LYTES, BLD GAS, ICA,H+H)
Acid-base deficit: 2 mmol/L (ref 0.0–2.0)
Bicarbonate: 22.4 mmol/L (ref 20.0–28.0)
Calcium, Ion: 1.4 mmol/L (ref 1.15–1.40)
HCT: 37 % (ref 36.0–46.0)
Hemoglobin: 12.6 g/dL (ref 12.0–15.0)
O2 Saturation: 100 %
Potassium: 3.9 mmol/L (ref 3.5–5.1)
Sodium: 138 mmol/L (ref 135–145)
TCO2: 23 mmol/L (ref 22–32)
pCO2 arterial: 36.8 mmHg (ref 32–48)
pH, Arterial: 7.392 (ref 7.35–7.45)
pO2, Arterial: 175 mmHg — ABNORMAL HIGH (ref 83–108)

## 2022-11-02 LAB — FOLATE: Folate: 1.8 ng/mL — ABNORMAL LOW (ref >5.9)

## 2022-11-02 LAB — PHOSPHORUS
Phosphorus: 3.3 mg/dL (ref 2.5–4.6)
Phosphorus: 3.2 mg/dL (ref 2.5–4.6)

## 2022-11-02 LAB — BASIC METABOLIC PANEL
Anion gap: 8 (ref 5–15)
BUN: 15 mg/dL (ref 6–20)
CO2: 23 mmol/L (ref 22–32)
Calcium: 9 mg/dL (ref 8.9–10.3)
Chloride: 106 mmol/L (ref 98–111)
Creatinine, Ser: 1.29 mg/dL — ABNORMAL HIGH (ref 0.44–1.00)
GFR, Estimated: 51 mL/min — ABNORMAL LOW (ref >60)
Glucose, Bld: 170 mg/dL — ABNORMAL HIGH (ref 70–99)
Potassium: 4.5 mmol/L (ref 3.5–5.1)
Sodium: 137 mmol/L (ref 135–145)

## 2022-11-02 LAB — MAGNESIUM: Magnesium: 1.9 mg/dL (ref 1.7–2.4)

## 2022-11-02 LAB — VITAMIN D 25 HYDROXY (VIT D DEFICIENCY, FRACTURES): Vit D, 25-Hydroxy: 31.72 ng/mL (ref 30–100)

## 2022-11-02 LAB — PROCALCITONIN: Procalcitonin: 3.17 ng/mL

## 2022-11-02 LAB — HIV ANTIBODY (ROUTINE TESTING W REFLEX): HIV Screen 4th Generation wRfx: NONREACTIVE

## 2022-11-02 LAB — HEMOGLOBIN A1C
Hgb A1c MFr Bld: 5.1 % (ref 4.8–5.6)
Mean Plasma Glucose: 100 mg/dL

## 2022-11-02 LAB — C-REACTIVE PROTEIN: CRP: 26 mg/dL — ABNORMAL HIGH (ref <1.0)

## 2022-11-02 LAB — VITAMIN B12: Vitamin B-12: 710 pg/mL (ref 180–914)

## 2022-11-02 MED ORDER — DEXMEDETOMIDINE HCL IN NACL 400 MCG/100ML IV SOLN
0.0000 ug/kg/h | INTRAVENOUS | Status: DC
  Administered 2022-11-02: 0.8 ug/kg/h via INTRAVENOUS
  Administered 2022-11-02 (×2): 0.4 ug/kg/h via INTRAVENOUS
  Administered 2022-11-03: 0.6 ug/kg/h via INTRAVENOUS
  Administered 2022-11-03 (×2): 1 ug/kg/h via INTRAVENOUS
  Filled 2022-11-02 (×5): qty 100

## 2022-11-02 MED ORDER — FENTANYL CITRATE PF 50 MCG/ML IJ SOSY: 50.0000 ug | PREFILLED_SYRINGE | INTRAMUSCULAR | Status: DC | PRN

## 2022-11-02 MED ORDER — ASPIRIN 81 MG PO CHEW
81.0000 mg | CHEWABLE_TABLET | Freq: Every day | ORAL | Status: DC
  Administered 2022-11-02 – 2022-11-05 (×4): 81 mg
  Filled 2022-11-02 (×4): qty 1

## 2022-11-02 MED ORDER — MAGNESIUM SULFATE 2 GM/50ML IV SOLN
2.0000 g | Freq: Once | INTRAVENOUS | Status: AC
  Administered 2022-11-02: 2 g via INTRAVENOUS
  Filled 2022-11-02: qty 50

## 2022-11-02 MED ORDER — ALBUTEROL SULFATE (2.5 MG/3ML) 0.083% IN NEBU: 2.5000 mg | INHALATION_SOLUTION | RESPIRATORY_TRACT | Status: DC | PRN

## 2022-11-02 MED ORDER — VITAL AF 1.2 CAL PO LIQD
1000.0000 mL | ORAL | Status: DC
  Administered 2022-11-02 – 2022-11-03 (×2): 1000 mL
  Filled 2022-11-02: qty 1000

## 2022-11-02 MED ORDER — FUROSEMIDE 10 MG/ML IJ SOLN
40.0000 mg | Freq: Once | INTRAMUSCULAR | Status: AC
  Administered 2022-11-02: 40 mg via INTRAVENOUS
  Filled 2022-11-02: qty 4

## 2022-11-02 NOTE — Progress Notes (Signed)
NAME:  Karen Lutz, MRN:  [id number], DOB:  May 15, 1971, LOS: 1 ADMISSION DATE:  11/01/2022, CONSULTATION DATE:  11/01/2022 REFERRING MD:  Dr. Betsey Holiday, ER, CHIEF COMPLAINT:  Cardiac arrest   History of Present Illness:  52 yo female with recent history of bariatric surgery. She was found by boyfriend unresponsive after falling asleep while watching TV.  EMS called and she was in VF.  Had defibrillation and then went into PEA and VT.  Had defibrillation again and went into SVT.  Given amiodarone and started on Epi gtt.  Given narcan.  About 23 minutes of CPR before ROSC.  Intubated in ER.  Potassium 2.9.  Dyssynchronous with the ventilator in the ED requiring sedation with fentanyl and versed. No wakefulness or purposeful behavior seen post arrest. Requiring epinephrine infusion. PCCM asked to admit to ICU.  Pertinent  Medical History  GERD, Obesity s/p bariatric surgery  Significant Hospital Events: Including procedures, antibiotic start and stop dates in addition to other pertinent events   3/28 Admit s/p cardiac arrest, PICC  Interim History / Subjective:  NE 4 mcg CVP 11 Coox 74 Decreasing sedation, fentanyl 100, versed 1, starting to cough on ETT, open eyes w/upward gaze, and move all extremities but non-purposefully.  Daughter at bedside reports patient opened her eyes and blinked twice for her this morning.   Objective   Blood pressure 100/71, pulse 64, temperature 98.6 F (37 C), resp. rate (!) 24, height 5\' 6"  (1.676 m), weight 116.5 kg, SpO2 100 %. CVP:  [9 mmHg-20 mmHg] 11 mmHg  Vent Mode: PRVC FiO2 (%):  [40 %-50 %] 40 % Set Rate:  [20 bmp-26 bmp] 24 bmp Vt Set:  [470 mL] 470 mL PEEP:  [5 cmH20-8 cmH20] 5 cmH20 Plateau Pressure:  [20 cmH20-29 cmH20] 20 cmH20   Intake/Output Summary (Last 24 hours) at 11/02/2022 [id number] Last data filed at 11/02/2022 0700 Gross per 24 hour  Intake 1722.04 ml  Output 1285 ml  Net 437.04 ml   Filed Weights   11/01/22 0525 11/02/22  0406  Weight: 119 kg 116.5 kg   Examination: Fent 100, versed 1 General:  Critically ill adult female lying in bed coughing  HEENT: MM pink/moist, ETT/ OGT, pupils 4/reactive with upward gaze- did not blink to threat, +corneals Neuro:  did not follow commands, or track, arms lifted to gravity but not localizing, moving spont movement in all extremities CV: rr, NSR,  PULM:  non labored on full MV support, some scattered rhonchi and faint wheeze, minimal tan secretions, Pplat 20, PIP 30 GI: soft, hypoBS, foley-amber  Extremities: warm, cooling pads in place/dry, no LE edema  Skin: no rashes   UOP 1.2L /24hrs Net +1.9L  Labs> coox 74, K 4.5, sCr 1.21> 1.29, Mag 1.9, WBC 13, PCT 3.17 CBGs- 123-173 on TF 66ml/hr  Resolved Hospital Problem list    Assessment & Plan:   Shock, suspect myocardial stunning  VF cardiac arrest: Unknown downtime prior to EMS arrival. 23 mins CPR. VF initial rhythm. EKG not markedly different from prior. CT head read negative. Systolic and diastolic HF> no prior echo to compare, unclear if acute on chronic vs acute Prolonged Qtc Troponin elevation- suspected due to cardiac arrest  - suspected related to hypokalemia and prolonged Qtc - has significant family cardiac history, add ASA - Echo 3/28: LVEF 25%, global hypokinesis, worse in apex and septum.  Grade 2 diastolic dysfunction.  Normal RV size and function.  Moderately dilated LA.  Mild MR.  Normal IVC. - cont to wean NE for MAP goal > 65 - EKG today> SR, qwaves in inferior and septal leads, Qtc now 522 from yest 511.  On review, had abnormal EKG in 06/2022 and in 05/2016 (anterior q waves then) - stop amio given worsening Qtc.   - cardiology consulted for further recommendations and workup pending neuro recovery, appreciate assistance.   - ASA daily - coox reassuring - Trend CVP> 11,  lasix as below - strict I/O's - optimize electrolytes, K> 4, Mag > 2 - ongoing infectious workup> follow cultures -  trend PCT - cont ceftriaxone/ flagyl for now  Acute respiratory failure with hypoxia secondary to above,  RLL aspiration PNA, and pulmonary edema - CXR > ETT at [place], R base opacity, mild pulmonary edema  - retract ETT  - lasix 40mg  today  - cont PRVC 4-8cc/kg IBW with goal Pplat <30 and DP<15  - improved FiO2 needs, continue to wean, sat goal > 92% - PAD protocol> minimize as able.  D/c versed.  Precedex and switch to fentanyl pushes w/ bowel regimen - follow trach asp - abx as above   Hypokalemia - goal > 4, optimize electrolytes as able   QTC prolongation  - optimize electrolytes - tele montioring, daily Qtc monitoring - avoid Qtc prolonging meds  Morbid obesity s/p bariatric surgery: Laparoscopic SADI-S with duodenal switch 06/2022. 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  If no significant neuro improvement in next 48hrs, consider MRI brain  - supportive care as above, frequent reassessments  AKI 2/2 cardiac arrest, hypoperfusion - cont foley - trend renal indices - strict I/Os, daily wts - Avoid nephrotoxic agents, ensure adequate renal perfusion  Hyperglycemia  - SSI prn  - CBG goal 140-180  Transaminitis, mild - likely in the setting of shock/ hypoperfusion.  LFTs near normalization.    Best Practice (right click and "Reselect all SmartList Selections" daily)   Diet/type: NPO; cortrak, TF per RD DVT prophylaxis: prophylactic heparin  GI prophylaxis: H2B Lines: Central line- PICC Foley:  Yes,  and it is still needed Code Status:  full code Last date of multidisciplinary goals of care discussion [ ]   Pt's daughter and sister at bedside. Updated on plan of care.  Pt's sister, Gayland Curry [phone] to remain the first contact/ decision maker.   Daughter at bedside, updated 3/29.  Labs   CBC: Recent Labs  Lab 11/01/22 0250 11/01/22 0254 11/01/22 0420 11/01/22 0938 11/01/22 1132 11/01/22 1433 11/02/22 0350  WBC 14.0*  --   --   --   --   --  13.0*  NEUTROABS 3.8  --   --   --   --   --   --   HGB 12.1   < > 12.6 13.6 13.6 13.9 12.3  HCT 38.9   < > 37.0 40.0 40.0 41.0 38.2  MCV 88.8  --   --   --   --   --  85.5  PLT 232  --   --   --   --   --  172   < > = values in this interval not displayed.    Basic Metabolic Panel: Recent Labs  Lab 11/01/22 0250 11/01/22 0254 11/01/22 0341 11/01/22 0938 11/01/22 1132 11/01/22 1213 11/01/22 1433 11/02/22 0350  NA 140 143   < > 138 140 139 140 137  K 2.9* 2.9*   < > 3.5 4.0 4.8 4.4 4.5  CL 106 106  --   --   --  107  --  106  CO2 24  --   --   --   --  21*  --  23  GLUCOSE 175* 166*  --   --   --  151*  --  170*  BUN 9 8  --   --   --  11  --  15  CREATININE 0.94 1.00  --   --   --  1.21*  --  1.29*  CALCIUM 8.4*  --   --   --   --  8.3*  --  9.0  MG  --   --   --   --   --  2.1  --  1.9  PHOS  --   --   --   --   --  3.3  --  3.3   < > = values in this interval not displayed.   GFR: Estimated Creatinine Clearance: 67.7 mL/min (A) (by C-G formula based on SCr of 1.29 mg/dL (H)). Recent Labs  Lab 11/01/22 0250 11/02/22 0350  PROCALCITON  --  3.17  WBC 14.0* 13.0*    Liver Function Tests: Recent Labs  Lab 11/01/22 0250 11/02/22 0350  AST 209* 50*  ALT 60* 44  ALKPHOS 83 83  BILITOT 0.5 0.7  PROT 5.2* 5.3*  ALBUMIN 2.7* 2.5*   No results for input(s): "LIPASE", "AMYLASE" in the last 168 hours. No results for input(s): "AMMONIA" in the last 168 hours.  ABG    Component Value Date/Time   PHART 7.275  (L) 11/01/2022 1132   PCO2ART 43.4 11/01/2022 1132   PO2ART 126 (H) 11/01/2022 1132   HCO3 21.8 11/01/2022 1433   TCO2 23 11/01/2022 1433   ACIDBASEDEF 7.0 (H) 11/01/2022 1433   O2SAT 74.7 11/02/2022 0356     Coagulation Profile: No results for input(s): "INR", "PROTIME" in the last 168 hours.  Cardiac Enzymes: No results for input(s): "CKTOTAL", "CKMB", "CKMBINDEX", "TROPONINI" in the last 168 hours.  HbA1C: Hgb A1c MFr Bld  Date/Time Value Ref Range Status  02/06/2011 03:38 PM 5.6 <5.7 % Final    Comment:    (NOTE)                                                                       According to the ADA Clinical Practice Recommendations for 2011, when HbA1c is used as a screening test:  >=6.5%   Diagnostic of Diabetes Mellitus           (if abnormal result is confirmed) 5.7-6.4%   Increased risk of developing Diabetes Mellitus References:Diagnosis and Classification of Diabetes Mellitus,Diabetes [id number] 1):S62-S69 and Standards of Medical Care in         Diabetes - 2011,Diabetes [id number] (Suppl 1):S11-S61.    CBG: Recent Labs  Lab 11/01/22  1623 11/01/22 2031 11/01/22 2359 11/02/22 0351 11/02/22 0800  GLUCAP 84 173* 123* 140* 147*     Critical care time: 42 minutes    Kennieth Rad, MSN, AG-ACNP-BC [facility] 11/02/2022, 8:19 AM  See Amion for pager If no response to pager, please call PCCM consult pager After 7:00 pm call Elink

## 2022-11-02 NOTE — Progress Notes (Signed)
ETT retracted 2 cm per physician's order.  Tube secured at 21 cm at the lip.

## 2022-11-02 NOTE — Progress Notes (Signed)
[facility] ADULT ICU REPLACEMENT PROTOCOL   The patient does apply for the [facility] Adult ICU Electrolyte Replacment Protocol based on the criteria listed below:   1.Exclusion criteria: TCTS, ECMO, Dialysis, and Myasthenia Gravis patients 2. Is GFR >/= 30 ml/min? Yes.    Patient's GFR today is 51 3. Is SCr </= 2? Yes.   Patient's SCr is 1.29 mg/dL 4. Did SCr increase >/= 0.5 in 24 hours? No. 5.Pt's weight >40kg  Yes.   6. Abnormal electrolyte(s): Mag 1.9  7. Electrolytes replaced per protocol 8.  Call MD STAT for K+ </= 2.5, Phos </= 1, or Mag </= 1 Physician:  Eula Fried [facility] 11/02/2022 5:52 AM

## 2022-11-02 NOTE — Progress Notes (Signed)
Brief Nutrition Support Note  Cortrak tube placed this AM and being read as in the small bowl out of the gastric sleeve. Will begin to advance TF to goal. RN reports good tolerance so far.  Discussed with RN. Reports that pt seems to be more responsive than yesterday. Will proceed with drawing micronutrient labs to determine if nutrient deficiencies are present.  INTERVENTION:  Initiate tube feeding via OGT and hold at trickle. Change to PP cortrak tomorrow so TF rate can be advanced. Goal: Vital AF at 60 ml/h (1440 ml per day) Begin advancing by 24mL q4h to goal. Provides 1728 kcal, 108 gm protein, 1168 ml free water daily Assess labs: Folate, Vitamin A, Zinc, Vitamin D, Vitamin B12, copper, thiamine  Ranell Patrick, RD, LDN Clinical Dietitian RD pager # available in AMION  After hours/weekend pager # available in [facility]

## 2022-11-02 NOTE — TOC Progression Note (Signed)
Transition of Care [facility]) - Progression Note    Patient Details  Name: Karen Lutz MRN: [id number] Date of Birth: 12/29/1970  [facility]) CM/SW Contact  Erenest Rasher, RN Phone Number: [phone] 11/02/2022, 5:07 PM  Clinical Narrative:    CM spoke to sister, Odette Horns and she is the contact for sister. States sister lives alone. Does not need FMLA paperwork. Will continue to follow for dc needs.         Expected Discharge Plan and Services   Will need PT/OT recommendations.   Social Determinants of Health (SDOH) Interventions SDOH Screenings   Food Insecurity: No Food Insecurity (11/02/2022)  Housing: Low Risk  (11/02/2022)  Transportation Needs: No Transportation Needs (11/02/2022)  Utilities: Not At Risk (11/02/2022)  Financial Resource Strain: Low Risk  (11/02/2022)  Tobacco Use: Low Risk  (11/01/2022)    Readmission Risk Interventions     No data to display

## 2022-11-02 NOTE — Procedures (Signed)
Cortrak  Tube Type:  Cortrak - 43 inches Tube Location:  Left nare Initial Placement:  Postpyloric Secured by: Bridle Technique Used to Measure Tube Placement:  Marking at nare/corner of mouth Cortrak Secured At:  90 cm   Cortrak Tube Team Note:  Consult received to place a Cortrak feeding tube.   X-ray is required, abdominal x-ray has been ordered by the Cortrak team. Please confirm tube placement before using the Cortrak tube.   If the tube becomes dislodged please keep the tube and contact the Cortrak team at [email or web address] for replacement.  If after hours and replacement cannot be delayed, place a NG tube and confirm placement with an abdominal x-ray.    Riordan Walle MS, RD, LDN Please refer to AMION for RD and/or RD on-call/weekend/after hours pager   

## 2022-11-02 NOTE — Consult Note (Addendum)
Advanced Heart Failure Team Consult Note   Primary Physician: Jola Baptist, PA-C PCP-Cardiologist:  None  Reason for Consultation: Acute Systolic Heart Failure, Post OOH VF Arrest   HPI:    Karen Lutz is seen today for evaluation of acute systolic heart failure, post OOH VF arrest, at the request of Dr. Tamala Julian, PCCM.   52 y/o [name] w/ h/o obesity s/p bariatric surgery 11/23 at [facility], reported FH of premature CAD (father MI in 40s) and FH of CHF (2 sisters), admitted for VF arrest. Found unresponsive by her boyfriend. Complete downtime unknown. Required ~ 23 min of CPR before ROSC. Intubated in the ED. Hypokalemic on arrival, initial K 2.9, Mg 2.1. Initial EKG sinus rhythm w/ incomplete left bundle branch block, QTc prolonged 511 ms. Hs trop 27>>99>>178, AST 209, ALT 60, SCr 1.21  UDS + for benzodiazepines  2D echo EF 25%, w/ global HK, RV normal, mild MR   Hypokalemia corrected today w/ supplementation, K 4.5, Mg 1.9. Repeat EKG QTc 522 ms, ? Inferior septal infarct.   Today remains intubated. On NE 5. Co-ox 75%, CVP 6. On empiric ceftriaxone for aspiration coverage. She had a 4 beat run of NSVT earlier today but no further sustained arrhythmias. Now off amiodarone w/ prolonged QT.   She is currently sedated. Daughter present at bedside and reported noticeable movement from pt earlier today when sedation was weaned. Later required increased sedation to allow placement of Cortrak. CCM now planning to wean back down.   Daughter states that patient reported complaints of abdominal pain in days leading up to arrest but no reports of chest pain.   Of note, pt had influenza A in 12/23.    2D Echo 11/01/22 1. Global hypokinesis worse in septum and apex. Left ventricular ejection  fraction, by estimation, is 25%. The left ventricle has normal function.  The left ventricle demonstrates global hypokinesis. The left ventricular  internal cavity size was severely   dilated. Left  ventricular diastolic parameters are consistent with Grade  II diastolic dysfunction (pseudonormalization). Elevated left ventricular  end-diastolic pressure.   2. Right ventricular systolic function is normal. The right ventricular  size is normal. There is normal pulmonary artery systolic pressure.   3. Left atrial size was moderately dilated.   4. The mitral valve is abnormal. Mild mitral valve regurgitation. No  evidence of mitral stenosis.   5. The aortic valve is tricuspid. Aortic valve regurgitation is not  visualized. No aortic stenosis is present.   6. The inferior vena cava is normal in size with greater than 50%  respiratory variability, suggesting right atrial pressure of 3 mmHg.      Review of Systems: unable to obtain given pt state, currently intubated and sedated  Home Medications Prior to Admission medications   Medication Sig Start Date End Date Taking? Authorizing Provider  predniSONE (STERAPRED UNI-PAK 21 TAB) 5 MG (21) TBPK tablet Take 5-10 mg by mouth See admin instructions. follow package directions (Typical regimens for 21 tablet dose packs of Methylprednisolone 4mg , Prednisone 5mg , and Prednisone 10mg ) Day 1: 2 tabs before breakfast, 1 tab after lunch, 1 tab after supper, and 2 tabs at bedtime. Day 2: 1 tab before breakfast, 1 tab after lunch, 1 tab after supper, and 2 tabs at bedtime. Day 3: 1 tab before breakfast, 1 tab after lunch, 1 tab after supper, and 1 tab at bedtime. Day 4: 1 tab before breakfast, 1 tab after lunch, and 1 tab at bedtime.  Day 5: 1 tab before breakfast and 1 tab at bedtime. Day 6: 1 tab before breakfast. 10/22/22  Yes [provider]  Cholecalciferol 1.25 MG (50000 UT) capsule Take 50,000 Units by mouth once a week.    [provider]  cyanocobalamin (VITAMIN B12) 1000 MCG/ML injection Inject 1,000 mcg into the muscle every 30 (thirty) days. 10/18/22   [provider]  HYDROcodone-acetaminophen (NORCO) 10-325 MG  tablet Take 1 tablet by mouth every 6 (six) hours as needed for moderate pain or severe pain. 10/22/22   [provider]  pantoprazole (PROTONIX) 40 MG tablet Take 1 tablet (40 mg total) by mouth daily. 05/04/21 06/03/21  McDonald, Mia A, PA-C  predniSONE (DELTASONE) 50 MG tablet Take 1 tablet (50 mg total) by mouth daily with breakfast. 07/23/22   Jaynee Eagles, PA-C  sucralfate (CARAFATE) 1 g tablet Take 1 tablet (1 g total) by mouth 4 (four) times daily. 05/04/21 06/03/21  McDonald, Mia A, PA-C  albuterol (VENTOLIN HFA) 108 (90 Base) MCG/ACT inhaler Inhale 2 puffs into the lungs every 4 (four) hours as needed for wheezing or shortness of breath. Patient not taking: Reported on 10/30/2020 09/08/20 11/24/20  Hans Eden, NP    Past Medical History: Past Medical History:  Diagnosis Date   Acid reflux    Anemia    Fibroids    Heart murmur    Knee pain    UTI (lower urinary tract infection)     Past Surgical History: Past Surgical History:  Procedure Laterality Date   ABDOMINAL HYSTERECTOMY     BARIATRIC SURGERY     [name] Single x1 week ago   ceaserian     CESAREAN SECTION     CESAREAN SECTION     KNEE SURGERY Left    KNEE SURGERY      Family History: Family History  Problem Relation Age of Onset   Hypertension Other    Cancer Other    Alcohol abuse Mother    Heart disease Mother    Alcohol abuse Father    Heart disease Father    Arthritis Maternal Grandmother    Arthritis Maternal Grandfather    Arthritis Paternal Grandmother    Arthritis Paternal Grandfather     Social History: Social History   Socioeconomic History   Marital status: Legally Separated    Spouse name: Not on file   Number of children: Not on file   Years of education: Not on file   Highest education level: Not on file  Occupational History   Not on file  Tobacco Use   Smoking status: Never   Smokeless tobacco: Never  Vaping Use   Vaping Use: Never used  Substance and Sexual  Activity   Alcohol use: No   Drug use: Never   Sexual activity: Yes    Birth control/protection: None  Other Topics Concern   Not on file  Social History Narrative   Not on file   Social Determinants of Health   Financial Resource Strain: Not on file  Food Insecurity: Not on file  Transportation Needs: Not on file  Physical Activity: Not on file  Stress: Not on file  Social Connections: Not on file    Allergies:  Allergies  Allergen Reactions   Penicillins Rash    Has patient had a PCN reaction causing immediate rash, facial/tongue/throat swelling, SOB or lightheadedness with hypotension: unknown Has patient had a PCN reaction causing severe rash involving mucus membranes or skin necrosis: unknown Has patient  had a PCN reaction that required hospitalization : yes Has patient had a PCN reaction occurring within the last 10 years: no If all of the above answers are "NO", then may proceed with Cephalosporin use.    Penicillin G Other (See Comments)    Per patient, childhood allergy, reaction unknown.      Objective:    Vital Signs:   Temp:  [97.2 F (36.2 C)-99.3 F (37.4 C)] 98.6 F (37 C) (03/29 1200) Pulse Rate:  [63-85] 64 (03/29 0730) Resp:  [0-26] 24 (03/29 0730) BP: (84-156)/(54-103) 100/71 (03/29 0730) SpO2:  [87 %-100 %] 100 % (03/29 0730) FiO2 (%):  [40 %-50 %] 40 % (03/29 1011) Weight:  [116.5 kg] 116.5 kg (03/29 0406) Last BM Date :  (PTA)  Weight change: Filed Weights   11/01/22 0525 11/02/22 0406  Weight: 119 kg 116.5 kg    Intake/Output:   Intake/Output Summary (Last 24 hours) at 11/02/2022 1342 Last data filed at 11/02/2022 0700 Gross per 24 hour  Intake 1474.12 ml  Output 1285 ml  Net 189.12 ml      Physical Exam    CVP 6 General:  obese, intubated and sedated  HEENT: normal + ETT  Neck: supple. Thick neck, JVD not well visualized . + rt IJ CVC Carotids 2+ bilat; no bruits. No lymphadenopathy or thyromegaly appreciated. Cor: PMI  nondisplaced. Regular rate & rhythm. No rubs, gallops or murmurs. Lungs: intubated and clear  Abdomen: obese, soft, nontender, nondistended. No hepatosplenomegaly. No bruits or masses. Good bowel sounds. Extremities: no cyanosis, clubbing, rash, trace b/l LE edema Neuro: intubated and sedated  GU: + foley    Telemetry   NSR 60s, one 4 beat run of NSVT. No further sustained arhythmias   EKG    NSR w/ prolonged QTc, 522 ms, inferoseptal q waves   Labs   Basic Metabolic Panel: Recent Labs  Lab 11/01/22 0250 11/01/22 0254 11/01/22 0341 11/01/22 0938 11/01/22 1132 11/01/22 1213 11/01/22 1433 11/02/22 0350  NA 140 143   < > 138 140 139 140 137  K 2.9* 2.9*   < > 3.5 4.0 4.8 4.4 4.5  CL 106 106  --   --   --  107  --  106  CO2 24  --   --   --   --  21*  --  23  GLUCOSE 175* 166*  --   --   --  151*  --  170*  BUN 9 8  --   --   --  11  --  15  CREATININE 0.94 1.00  --   --   --  1.21*  --  1.29*  CALCIUM 8.4*  --   --   --   --  8.3*  --  9.0  MG  --   --   --   --   --  2.1  --  1.9  PHOS  --   --   --   --   --  3.3  --  3.3   < > = values in this interval not displayed.    Liver Function Tests: Recent Labs  Lab 11/01/22 0250 11/02/22 0350  AST 209* 50*  ALT 60* 44  ALKPHOS 83 83  BILITOT 0.5 0.7  PROT 5.2* 5.3*  ALBUMIN 2.7* 2.5*   No results for input(s): "LIPASE", "AMYLASE" in the last 168 hours. No results for input(s): "AMMONIA" in the last 168 hours.  CBC: Recent Labs  Lab 11/01/22 0250 11/01/22 0254 11/01/22 0420 11/01/22 0938 11/01/22 1132 11/01/22 1433 11/02/22 0350  WBC 14.0*  --   --   --   --   --  13.0*  NEUTROABS 3.8  --   --   --   --   --   --   HGB 12.1   < > 12.6 13.6 13.6 13.9 12.3  HCT 38.9   < > 37.0 40.0 40.0 41.0 38.2  MCV 88.8  --   --   --   --   --  85.5  PLT 232  --   --   --   --   --  172   < > = values in this interval not displayed.    Cardiac Enzymes: No results for input(s): "CKTOTAL", "CKMB", "CKMBINDEX",  "TROPONINI" in the last 168 hours.  BNP: BNP (last 3 results) No results for input(s): "BNP" in the last 8760 hours.  ProBNP (last 3 results) No results for input(s): "PROBNP" in the last 8760 hours.   CBG: Recent Labs  Lab 11/01/22 2031 11/01/22 2359 11/02/22 0351 11/02/22 0800 11/02/22 1202  GLUCAP 173* 123* 140* 147* 149*    Coagulation Studies: No results for input(s): "LABPROT", "INR" in the last 72 hours.   Imaging   DG Abd Portable 1V  Result Date: 11/02/2022 CLINICAL DATA:  [id number] Encounter for feeding tube placement [id number] EXAM: PORTABLE ABDOMEN - 1 VIEW COMPARISON:  Radiograph 11/01/2022 FINDINGS: Nonobstructive bowel gas pattern. Mild gaseous distention of the colon. Feeding tube courses across the right hemiabdomen, inferiorly than cephalad with tip likely overlying the duodenum. Surgical clips noted. IMPRESSION: Feeding tube tip overlies the right hemiabdomen, likely overlying the duodenum. Electronically Signed   By: Maurine Simmering M.D.   On: 11/02/2022 11:09   DG Chest Port 1 View  Result Date: 11/02/2022 CLINICAL DATA:  Cardiac arrest EXAM: PORTABLE CHEST 1 VIEW COMPARISON:  CXR 11/01/22 FINDINGS: Endotracheal tube terminates at the level of the carina. Enteric tube courses below diaphragm with the tip out of the field of view. Right arm PICC terminates in the right atrium. Persistent cardiomegaly. No pleural effusion. No pneumothorax. Compared to prior exam there is a new hazy opacity at the right lung base/costophrenic angle. There are prominent bilateral interstitial opacities, which are favored to represent findings related to pulmonary venous congestion or mild pulmonary edema. No radiographically apparent displaced rib fractures. Visualized upper abdomen is unremarkable. IMPRESSION: 1. New hazy opacity at the right lung base/costophrenic angle, which may represent atelectasis or infection. 2. Prominent bilateral interstitial opacities, which are favored to represent  findings related to pulmonary venous congestion or mild pulmonary edema. 3. Endotracheal tube terminates at the level of the carina. Electronically Signed   By: Marin Roberts M.D.   On: 11/02/2022 08:37   DG Abd 1 View  Result Date: 11/01/2022 CLINICAL DATA:  Check gastric catheter placement EXAM: ABDOMEN - 1 VIEW COMPARISON:  Film from earlier in the same day. FINDINGS: Gastric catheter is noted in the distal stomach stable in appearance from the prior exam. Scattered large and small bowel gas is noted. IMPRESSION: Gastric catheter within the stomach. Electronically Signed   By: Inez Catalina M.D.   On: 11/01/2022 22:00   ECHOCARDIOGRAM COMPLETE  Result Date: 11/01/2022    ECHOCARDIOGRAM REPORT   Patient Name:   Karen Lutz Date of Exam: 11/01/2022 Medical Rec #:  [id number]        Height:  66.0 in Accession #:    [id number]       Weight:       262.3 lb Date of Birth:  Oct 19, 1970        BSA:          2.244 m Patient Age:    64 years         BP:           100/72 mmHg Patient Gender: F                HR:           87 bpm. Exam Location:  Inpatient Procedure: 2D Echo, Cardiac Doppler and Color Doppler Indications:    cardiac arrest  History:        Patient has no prior history of Echocardiogram examinations.                 Arrythmias:Ventricular Fibrillation.  Sonographer:    Johny Chess RDCS Referring Phys: [phone] PAULA B SIMPSON  Sonographer Comments: Patient is obese and echo performed with patient supine and on artificial respirator. Image acquisition challenging due to patient body habitus. IMPRESSIONS  1. Global hypokinesis worse in septum and apex. Left ventricular ejection fraction, by estimation, is 25%. The left ventricle has normal function. The left ventricle demonstrates global hypokinesis. The left ventricular internal cavity size was severely  dilated. Left ventricular diastolic parameters are consistent with Grade II diastolic dysfunction (pseudonormalization). Elevated left  ventricular end-diastolic pressure.  2. Right ventricular systolic function is normal. The right ventricular size is normal. There is normal pulmonary artery systolic pressure.  3. Left atrial size was moderately dilated.  4. The mitral valve is abnormal. Mild mitral valve regurgitation. No evidence of mitral stenosis.  5. The aortic valve is tricuspid. Aortic valve regurgitation is not visualized. No aortic stenosis is present.  6. The inferior vena cava is normal in size with greater than 50% respiratory variability, suggesting right atrial pressure of 3 mmHg. FINDINGS  Left Ventricle: Global hypokinesis worse in septum and apex. Left ventricular ejection fraction, by estimation, is 25%. The left ventricle has normal function. The left ventricle demonstrates global hypokinesis. The left ventricular internal cavity size  was severely dilated. There is no left ventricular hypertrophy. Left ventricular diastolic parameters are consistent with Grade II diastolic dysfunction (pseudonormalization). Elevated left ventricular end-diastolic pressure. Right Ventricle: The right ventricular size is normal. No increase in right ventricular wall thickness. Right ventricular systolic function is normal. There is normal pulmonary artery systolic pressure. The tricuspid regurgitant velocity is 2.81 m/s, and  with an assumed right atrial pressure of 3 mmHg, the estimated right ventricular systolic pressure is [id number] mmHg. Left Atrium: Left atrial size was moderately dilated. Right Atrium: Right atrial size was normal in size. Pericardium: There is no evidence of pericardial effusion. Mitral Valve: The mitral valve is abnormal. There is mild thickening of the mitral valve leaflet(s). There is mild calcification of the mitral valve leaflet(s). Mild mitral valve regurgitation. No evidence of mitral valve stenosis. Tricuspid Valve: The tricuspid valve is normal in structure. Tricuspid valve regurgitation is mild . No evidence of  tricuspid stenosis. Aortic Valve: The aortic valve is tricuspid. Aortic valve regurgitation is not visualized. No aortic stenosis is present. Pulmonic Valve: The pulmonic valve was normal in structure. Pulmonic valve regurgitation is not visualized. No evidence of pulmonic stenosis. Aorta: The aortic root is normal in size and structure. Venous: The inferior vena cava is normal in size with greater  than 50% respiratory variability, suggesting right atrial pressure of 3 mmHg. IAS/Shunts: The interatrial septum was not well visualized.  LEFT VENTRICLE PLAX 2D LVIDd:         6.60 cm      Diastology LVIDs:         5.70 cm      LV e' medial:    4.68 cm/s LV PW:         1.10 cm      LV E/e' medial:  22.6 LV IVS:        0.80 cm      LV e' lateral:   5.77 cm/s LVOT diam:     1.80 cm      LV E/e' lateral: 18.4 LV SV:         26 LV SV Index:   12 LVOT Area:     2.54 cm  LV Volumes (MOD) LV vol d, MOD A4C: 168.0 ml LV vol s, MOD A4C: 133.0 ml LV SV MOD A4C:     168.0 ml RIGHT VENTRICLE             IVC RV S prime:     11.70 cm/s  IVC diam: 1.80 cm TAPSE (M-mode): 2.3 cm LEFT ATRIUM             Index        RIGHT ATRIUM           Index LA diam:        4.90 cm 2.18 cm/m   RA Area:     13.90 cm LA Vol (A2C):   92.1 ml 41.04 ml/m  RA Volume:   33.40 ml  14.88 ml/m LA Vol (A4C):   55.5 ml 24.73 ml/m LA Biplane Vol: 79.3 ml 35.33 ml/m  AORTIC VALVE LVOT Vmax:   63.90 cm/s LVOT Vmean:  42.400 cm/s LVOT VTI:    0.104 m  AORTA Ao Root diam: 2.30 cm Ao Asc diam:  2.70 cm MITRAL VALVE                 TRICUSPID VALVE MV Area (PHT): 6.12 cm      TR Peak grad:   31.6 mmHg MV Decel Time: 124 msec      TR Vmax:        281.00 cm/s MR Peak grad:   51.0 mmHg MR Mean grad:   34.0 mmHg    SHUNTS MR Vmax:        357.00 cm/s  Systemic VTI:  0.10 m MR Vmean:       272.0 cm/s   Systemic Diam: 1.80 cm MR PISA:        0.25 cm MR PISA Radius: 0.20 cm MV E velocity: 106.00 cm/s MV A velocity: 52.90 cm/s MV E/A ratio:  2.00 Jenkins Rouge MD  Electronically signed by Jenkins Rouge MD Signature Date/Time: 11/01/2022/2:59:22 PM    Final      Medications:     Current Medications:  aspirin  81 mg Per Tube Daily   Chlorhexidine Gluconate Cloth  6 each Topical Q0600   docusate  100 mg Per Tube BID   famotidine  20 mg Per Tube BID   heparin  5,000 Units Subcutaneous Q8H   insulin aspart  0-15 Units Subcutaneous Q4H   mouth rinse  15 mL Mouth Rinse Q2H   polyethylene glycol  17 g Per Tube Daily   sodium chloride flush  10-40 mL Intracatheter Q12H   thiamine (VITAMIN B1) injection  100 mg Intravenous Daily    Infusions:  cefTRIAXone (ROCEPHIN)  IV 2 g (11/02/22 1015)   dexmedetomidine (PRECEDEX) IV infusion 0.4 mcg/kg/hr (11/02/22 0856)   feeding supplement (VITAL AF 1.2 CAL) 1,000 mL (11/02/22 1206)   metronidazole 500 mg (11/02/22 1106)   norepinephrine (LEVOPHED) Adult infusion 5 mcg/min (11/02/22 1035)      Patient Profile   52 y/o [name] w/ h/o obesity s/p bariatric surgery 11/23 at [facility], reported FH of premature CAD (father MI in 51s) and nd FH of CHF (2 sisters). Had Influenza A 12/23. Admitted w/ OOH VF arrest.   Assessment/Plan   1. OOH VF Arrest - total downtime unknown. CRP ~25 min before ROSC - initial K 2.9, Mg 2.1, EKG no acute ST abnormalities, QTc prolonged 511 ms. Hs trop 27>>99>>178 - Echo EF 25% w/ diffuse HK, RV normal  - remains intubated and sedated  - TTM per CCM  - etiology uncertain, though may have been precipitated by hypokalemia.  - rhythm now stable. No in hospital VT/VF. Now off amio w/ prolonged QT. K improved, 4.5  - keep K > 4.0 and Mg > 2.0  - plan to wean sedation to allow for neuro exams  - If meaningful neurologic recovery, will plan LHC to exclude CAD and cMRI if cath unrevealing   - will need ICD prior to d/c, if she recovers, for secondary prevention  - needs genetic testing given FH   2. Acute Systolic Heart Failure - EF 25% w/ diffuse HK, RV normal - etiology uncertain. ?  Preexisting  cardiomyopathy vs myocardial stunning post arrest  - will need LHC to exclude CAD. EKG concerning for possible infarct. Hs trop not c/w ACS   - cMRI if cath negative. ? Familial CM given FH vs Viral CM (recent Influenza A 3 months ago) - co-ox 75% on NE support, currently on 5 mcg. CVP 6, no further diuresis today  - GDMT initiation once stable off NE  - will need ICD if she recovers   3. Acute Hypoxic Respiratory Failure - intubated, vent management per PCCM  - on empiric abx for possible aspiration PNA   4. Mild AKI  - Scr 1.0>>1.3 -maintaining good UOP, cont to monitor     Length of Stay: 1  Mathis Cashman, PA-C  11/02/2022, 1:42 PM  Advanced Heart Failure Team Pager [phone] (M-F; 7a - 5p)  Please contact [facility] for night-coverage after hours (4p -7a ) and weekends on [email or web address]

## 2022-11-03 ENCOUNTER — Inpatient Hospital Stay (HOSPITAL_COMMUNITY): Payer: BLUE CROSS/BLUE SHIELD

## 2022-11-03 DIAGNOSIS — I469 Cardiac arrest, cause unspecified: Secondary | ICD-10-CM | POA: Diagnosis not present

## 2022-11-03 LAB — HEPATIC FUNCTION PANEL
ALT: 32 U/L (ref 0–44)
AST: 23 U/L (ref 15–41)
Albumin: 2.4 g/dL — ABNORMAL LOW (ref 3.5–5.0)
Alkaline Phosphatase: 83 U/L (ref 38–126)
Bilirubin, Direct: 0.2 mg/dL (ref 0.0–0.2)
Indirect Bilirubin: 0.3 mg/dL (ref 0.3–0.9)
Total Bilirubin: 0.5 mg/dL (ref 0.3–1.2)
Total Protein: 5.3 g/dL — ABNORMAL LOW (ref 6.5–8.1)

## 2022-11-03 LAB — CBC
HCT: 36.7 % (ref 36.0–46.0)
Hemoglobin: 11.6 g/dL — ABNORMAL LOW (ref 12.0–15.0)
MCH: 26.9 pg (ref 26.0–34.0)
MCHC: 31.6 g/dL (ref 30.0–36.0)
MCV: 85.2 fL (ref 80.0–100.0)
Platelets: 144 10*3/uL — ABNORMAL LOW (ref 150–400)
RBC: 4.31 MIL/uL (ref 3.87–5.11)
RDW: 15.4 % (ref 11.5–15.5)
WBC: 7.8 10*3/uL (ref 4.0–10.5)
nRBC: 0 % (ref 0.0–0.2)

## 2022-11-03 LAB — BASIC METABOLIC PANEL
Anion gap: 8 (ref 5–15)
BUN: 18 mg/dL (ref 6–20)
CO2: 22 mmol/L (ref 22–32)
Calcium: 9.2 mg/dL (ref 8.9–10.3)
Chloride: 107 mmol/L (ref 98–111)
Creatinine, Ser: 1.33 mg/dL — ABNORMAL HIGH (ref 0.44–1.00)
GFR, Estimated: 49 mL/min — ABNORMAL LOW (ref >60)
Glucose, Bld: 161 mg/dL — ABNORMAL HIGH (ref 70–99)
Potassium: 3.4 mmol/L — ABNORMAL LOW (ref 3.5–5.1)
Sodium: 137 mmol/L (ref 135–145)

## 2022-11-03 LAB — PROCALCITONIN: Procalcitonin: 1.53 ng/mL

## 2022-11-03 LAB — GLUCOSE, CAPILLARY
Glucose-Capillary: 111 mg/dL — ABNORMAL HIGH (ref 70–99)
Glucose-Capillary: 89 mg/dL (ref 70–99)
Glucose-Capillary: 99 mg/dL (ref 70–99)
Glucose-Capillary: 88 mg/dL (ref 70–99)
Glucose-Capillary: 67 mg/dL — ABNORMAL LOW (ref 70–99)
Glucose-Capillary: 114 mg/dL — ABNORMAL HIGH (ref 70–99)

## 2022-11-03 LAB — CULTURE, RESPIRATORY W GRAM STAIN: Culture: NORMAL

## 2022-11-03 LAB — COOXEMETRY PANEL
Carboxyhemoglobin: 0.8 % (ref 0.5–1.5)
Methemoglobin: <0.7 % (ref 0.0–1.5)
O2 Saturation: 53.5 %
Total hemoglobin: 12 g/dL (ref 12.0–16.0)

## 2022-11-03 LAB — MAGNESIUM: Magnesium: 2.1 mg/dL (ref 1.7–2.4)

## 2022-11-03 LAB — PHOSPHORUS: Phosphorus: 3.4 mg/dL (ref 2.5–4.6)

## 2022-11-03 MED ORDER — POTASSIUM CHLORIDE 20 MEQ PO PACK
40.0000 meq | PACK | Freq: Once | ORAL | Status: AC
  Administered 2022-11-03: 40 meq
  Filled 2022-11-03: qty 2

## 2022-11-03 MED ORDER — DEXTROSE 50 % IV SOLN: 12.5000 g | INTRAVENOUS | Status: AC

## 2022-11-03 MED ORDER — ORAL CARE MOUTH RINSE: 15.0000 mL | OROMUCOSAL | Status: DC | PRN

## 2022-11-03 MED ORDER — ORAL CARE MOUTH RINSE
15.0000 mL | OROMUCOSAL | Status: DC
  Administered 2022-11-03 – 2022-11-05 (×5): 15 mL via OROMUCOSAL

## 2022-11-03 MED ORDER — FUROSEMIDE 10 MG/ML IJ SOLN
40.0000 mg | Freq: Once | INTRAMUSCULAR | Status: AC
  Administered 2022-11-03: 40 mg via INTRAVENOUS
  Filled 2022-11-03: qty 4

## 2022-11-03 MED ORDER — DEXTROSE 50 % IV SOLN
INTRAVENOUS | Status: AC
  Administered 2022-11-03: 12.5 g via INTRAVENOUS
  Filled 2022-11-03: qty 50

## 2022-11-03 NOTE — Consult Note (Signed)
Advanced Heart Failure Team Consult Note   Primary Physician: Jola Baptist, PA-C PCP-Cardiologist:  None  Reason for Consultation: Acute Systolic Heart Failure, Post OOH VF Arrest   HPI:    [name] w/ hx of obesity s/p bariatric surgery and significant family history of early CAD and early onset CHF currently admitted to [facility] after cardiac arrest at home. As per chart review and family at bedside, patient was found unresponsive by her boyfriend at home requiring at least 4minutes of CPR before ROSC; apparently in VF arrest. In the ER she was found to be hypokalemic with a potassium of 2.9 with UDS + for benzos.   Interval hx: - Opening eyes to command, moved upper extremities as per night staff.  - +1.7L since admission.   Objective:    Vital Signs:   Temp:  [97 F (36.1 C)-99.7 F (37.6 C)] 97.5 F (36.4 C) (03/30 1000) Pulse Rate:  [54-75] 54 (03/30 0900) Resp:  [24-29] 24 (03/30 0900) BP: (91-118)/(57-84) 94/68 (03/30 0900) SpO2:  [98 %-100 %] 100 % (03/30 0900) FiO2 (%):  [40 %] 40 % (03/30 0836) Weight:  [126 kg] 126 kg (03/30 0500) Last BM Date :  (pta)  Weight change: Filed Weights   11/01/22 0525 11/02/22 0406 11/03/22 0500  Weight: 119 kg 116.5 kg 126 kg    Intake/Output:   Intake/Output Summary (Last 24 hours) at 11/03/2022 1052 Last data filed at 11/03/2022 0903 Gross per 24 hour  Intake 1976.05 ml  Output 2120 ml  Net -143.95 ml       Physical Exam    CVP 5-7 General:  obese, intubated and sedated  HEENT: normal + ETT  Neck: supple. Thick neck, JVD not well visualized . + rt IJ CVC Carotids 2+ bilat; no bruits. No lymphadenopathy or thyromegaly appreciated. Cor: PMI nondisplaced. Regular rate & rhythm. No rubs, gallops or murmurs. Lungs: intubated and clear  Abdomen: obese, soft, nontender, nondistended. No hepatosplenomegaly. No bruits or masses. Good bowel sounds. Extremities: no cyanosis, clubbing, rash, trace b/l LE edema Neuro:  intubated and sedated  GU: + foley    Telemetry   NSR 70s; no arrhythmias.   EKG    NSR w/ prolonged QTc, 522 ms, inferoseptal q waves   Labs   Basic Metabolic Panel: Recent Labs  Lab 11/01/22 0250 11/01/22 0254 11/01/22 0341 11/01/22 1213 11/01/22 1433 11/02/22 0350 11/02/22 1150 11/02/22 1530 11/03/22 0400  NA 140 143   < > 139 140 137  --  138 137  K 2.9* 2.9*   < > 4.8 4.4 4.5  --  3.9 3.4*  CL 106 106  --  107  --  106  --   --  107  CO2 24  --   --  21*  --  23  --   --  22  GLUCOSE 175* 166*  --  151*  --  170*  --   --  161*  BUN 9 8  --  11  --  15  --   --  18  CREATININE 0.94 1.00  --  1.21*  --  1.29*  --   --  1.33*  CALCIUM 8.4*  --   --  8.3*  --  9.0  --   --  9.2  MG  --   --   --  2.1  --  1.9  --   --  2.1  PHOS  --   --   --  3.3  --  3.3 3.2  --  3.4   < > = values in this interval not displayed.     Liver Function Tests: Recent Labs  Lab 11/01/22 0250 11/02/22 0350 11/03/22 0400  AST 209* 50* 23  ALT 60* 44 32  ALKPHOS 83 83 83  BILITOT 0.5 0.7 0.5  PROT 5.2* 5.3* 5.3*  ALBUMIN 2.7* 2.5* 2.4*    No results for input(s): "LIPASE", "AMYLASE" in the last 168 hours. No results for input(s): "AMMONIA" in the last 168 hours.  CBC: Recent Labs  Lab 11/01/22 0250 11/01/22 0254 11/01/22 1132 11/01/22 1433 11/02/22 0350 11/02/22 1530 11/03/22 0400  WBC 14.0*  --   --   --  13.0*  --  7.8  NEUTROABS 3.8  --   --   --   --   --   --   HGB 12.1   < > 13.6 13.9 12.3 12.6 11.6*  HCT 38.9   < > 40.0 41.0 38.2 37.0 36.7  MCV 88.8  --   --   --  85.5  --  85.2  PLT 232  --   --   --  172  --  144*   < > = values in this interval not displayed.     CBG: Recent Labs  Lab 11/02/22 1606 11/02/22 2019 11/02/22 2323 11/03/22 0440 11/03/22 0805  GLUCAP 175* 78 107* 111* 89      Imaging   No results found.   Medications:     Current Medications:  aspirin  81 mg Per Tube Daily   Chlorhexidine Gluconate Cloth  6 each  Topical Q0600   docusate  100 mg Per Tube BID   famotidine  20 mg Per Tube BID   heparin  5,000 Units Subcutaneous Q8H   insulin aspart  0-15 Units Subcutaneous Q4H   mouth rinse  15 mL Mouth Rinse Q2H   polyethylene glycol  17 g Per Tube Daily   sodium chloride flush  10-40 mL Intracatheter Q12H   thiamine (VITAMIN B1) injection  100 mg Intravenous Daily    Infusions:  cefTRIAXone (ROCEPHIN)  IV 2 g (11/03/22 0903)   dexmedetomidine (PRECEDEX) IV infusion 0.343 mcg/kg/hr (11/03/22 0900)   feeding supplement (VITAL AF 1.2 CAL) 30 mL/hr at 11/03/22 0900   metronidazole 500 mg (11/03/22 1043)   norepinephrine (LEVOPHED) Adult infusion Stopped (11/03/22 0827)      Patient Profile   52 y/o [name] w/ h/o obesity s/p bariatric surgery 11/23 at [facility], reported FH of premature CAD (father MI in 18s) and nd FH of CHF (2 sisters). Had Influenza A 12/23. Admitted w/ OOH VF arrest.   Assessment/Plan   1. OOH VF Arrest - total downtime unknown. CRP ~25 min before ROSC - initial K 2.9, Mg 2.1, EKG no acute ST abnormalities, QTc prolonged 511 ms. Hs trop 27>>99>>178 - Echo EF 25% w/ diffuse HK, RV normal  - remains intubated and sedated  - TTM per CCM; requiring lower temps to maintain TTM; appears to be fevering.  - LV morphology and significant family history of early heart failure are concerning for a genetic component. Will plan for cardiac MRI, coronary angiography and RHC early this next week. Will likely benefit from secondary prevention ICD.   2. Acute Systolic Heart Failure - EF 25% w/ diffuse HK, RV normal - etiology uncertain. ? Preexisting  cardiomyopathy vs myocardial stunning post arrest  - CVP 5-7 today - IV lasix 40 x 1  to maintain net even to negative I/Os - Repeat CO-OX pending - will start low dose GDMT in the next 24-48H, currently remains hypotensive.   3. Acute Hypoxic Respiratory Failure - intubated, vent management per PCCM  - on empiric abx for possible aspiration  PNA   4. AKI - stable renal function.   Length of Stay: 2  Hebert Soho, DO  11/03/2022, 10:52 AM  Advanced Heart Failure Team Pager [phone] (M-F; 7a - 5p)  Please contact [facility] for night-coverage after hours (4p -7a ) and weekends on [email or web address]  CRITICAL CARE Performed by: Hebert Soho   Total critical care time: 40 minutes  Critical care time was exclusive of separately billable procedures and treating other patients.  Critical care was necessary to treat or prevent imminent or life-threatening deterioration.  Critical care was time spent personally by me on the following activities: development of treatment plan with patient and/or surrogate as well as nursing, discussions with consultants, evaluation of patient's response to treatment, examination of patient, obtaining history from patient or surrogate, ordering and performing treatments and interventions, ordering and review of laboratory studies, ordering and review of radiographic studies, pulse oximetry and re-evaluation of patient's condition.

## 2022-11-03 NOTE — Progress Notes (Signed)
[facility] ADULT ICU REPLACEMENT PROTOCOL   The patient does apply for the [facility] Adult ICU Electrolyte Replacment Protocol based on the criteria listed below:   1.Exclusion criteria: TCTS, ECMO, Dialysis, and Myasthenia Gravis patients 2. Is GFR >/= 30 ml/min? Yes.    Patient's GFR today is 49 3. Is SCr </= 2? Yes.   Patient's SCr is 1.33 mg/dL 4. Did SCr increase >/= 0.5 in 24 hours? No. 5.Pt's weight >40kg  Yes.   6. Abnormal electrolyte(s): K+ 3.4  7. Electrolytes replaced per protocol 8.  Call MD STAT for K+ </= 2.5, Phos </= 1, or Mag </= 1 Physician:  Eula Fried [facility] 11/03/2022 4:39 AM

## 2022-11-03 NOTE — Progress Notes (Signed)
NAME:  Karen Lutz, MRN:  [id number], DOB:  [birth year], LOS: 2 ADMISSION DATE:  11/01/2022, CONSULTATION DATE:  11/01/2022 REFERRING MD:  Dr. Betsey Holiday, ER, CHIEF COMPLAINT:  Cardiac arrest   History of Present Illness:  52 yo female with recent history of bariatric surgery. She was found by boyfriend unresponsive after falling asleep while watching TV.  EMS called and she was in VF.  Had defibrillation and then went into PEA and VT.  Had defibrillation again and went into SVT.  Given amiodarone and started on Epi gtt.  Given narcan.  About 23 minutes of CPR before ROSC.  Intubated in ER.  Potassium 2.9.  Dyssynchronous with the ventilator in the ED requiring sedation with fentanyl and versed. No wakefulness or purposeful behavior seen post arrest. Requiring epinephrine infusion. PCCM asked to admit to ICU.  Pertinent  Medical History  GERD, Obesity s/p bariatric surgery  Significant Hospital Events: Including procedures, antibiotic start and stop dates in addition to other pertinent events   3/28 Admit s/p cardiac arrest, PICC  Interim History / Subjective:  NE 4 mcg CVP 11 Coox 74 Decreasing sedation, fentanyl 100, versed 1, starting to cough on ETT, open eyes w/upward gaze, and move all extremities but non-purposefully.  Daughter at bedside reports patient opened her eyes and blinked twice for her this morning.   Objective   Blood pressure 100/68, pulse 71, temperature 98.1 F (36.7 C), resp. rate (!) 5, height 5\' 6"  (1.676 m), weight 126 kg, SpO2 99 %. CVP:  [8 mmHg-13 mmHg] 12 mmHg  Vent Mode: PRVC FiO2 (%):  [40 %] 40 % Set Rate:  [24 bmp] 24 bmp Vt Set:  [470 mL] 470 mL PEEP:  [5 cmH20] 5 cmH20 Plateau Pressure:  [18 cmH20-21 cmH20] 20 cmH20   Intake/Output Summary (Last 24 hours) at 11/03/2022 1149 Last data filed at 11/03/2022 1100 Gross per 24 hour  Intake 2204.26 ml  Output 2270 ml  Net -65.74 ml    Filed Weights   11/01/22 0525 11/02/22 0406 11/03/22 0500   Weight: 119 kg 116.5 kg 126 kg   Examination: No distress MMM, trachea midline Following commands Lungs diminished bases Abd soft  K repleted Edema looks a little better on CXR  Resolved Hospital Problem list    Assessment & Plan:   Shock, suspect myocardial stunning, improving VF cardiac arrest: Unknown downtime prior to EMS arrival. 23 mins CPR. VF initial rhythm. EKG not markedly different from prior. CT head read negative. Systolic and diastolic HF> no prior echo to compare, unclear if acute on chronic vs acute Prolonged Qtc Troponin elevation- suspected due to cardiac arrest  Suspected aspiration Post arrest encephalopathy- improved, on TTM Post arrest hypoxemic resp failure Shock liver improved Hx cocaine, etoh use- neg on admit  - SAT/SBT, consider extubation trial - GDMT, diuresis, further arrest/ cardiomyoapthy workup per Dr. Gabriana Wilmott Nones, appreciate help - Thiamine/folate, watch for s/s of w/d - Gentle diuresis  Best Practice (right click and "Reselect all SmartList Selections" daily)   Diet/type: NPO pending swallow screen DVT prophylaxis: prophylactic heparin  GI prophylaxis: H2B Lines: Central line- PICC Foley:  Yes, and it is still needed Code Status:  full code Last date of multidisciplinary goals of care discussion [ pending]  Pt's daughter and sister at bedside 3/28. Updated on plan of care.  Pt's sister, Gayland Curry [phone] to remain the first contact/ decision maker.   Daughter at bedside, updated 3/29.  No family at bedside 3/30.  My  cc time 34 min Erskine Emery MD PCCM

## 2022-11-04 ENCOUNTER — Inpatient Hospital Stay (HOSPITAL_COMMUNITY): Payer: BLUE CROSS/BLUE SHIELD

## 2022-11-04 DIAGNOSIS — I469 Cardiac arrest, cause unspecified: Secondary | ICD-10-CM | POA: Diagnosis not present

## 2022-11-04 LAB — COOXEMETRY PANEL
Carboxyhemoglobin: 1.4 % (ref 0.5–1.5)
Methemoglobin: <0.7 % (ref 0.0–1.5)
O2 Saturation: 80.6 %
Total hemoglobin: 11 g/dL — ABNORMAL LOW (ref 12.0–16.0)

## 2022-11-04 LAB — GLUCOSE, CAPILLARY
Glucose-Capillary: 105 mg/dL — ABNORMAL HIGH (ref 70–99)
Glucose-Capillary: 110 mg/dL — ABNORMAL HIGH (ref 70–99)
Glucose-Capillary: 78 mg/dL (ref 70–99)
Glucose-Capillary: 85 mg/dL (ref 70–99)
Glucose-Capillary: 85 mg/dL (ref 70–99)
Glucose-Capillary: 97 mg/dL (ref 70–99)
Glucose-Capillary: 98 mg/dL (ref 70–99)

## 2022-11-04 LAB — BASIC METABOLIC PANEL
Anion gap: 8 (ref 5–15)
BUN: 20 mg/dL (ref 6–20)
CO2: 24 mmol/L (ref 22–32)
Calcium: 9 mg/dL (ref 8.9–10.3)
Chloride: 108 mmol/L (ref 98–111)
Creatinine, Ser: 1.2 mg/dL — ABNORMAL HIGH (ref 0.44–1.00)
GFR, Estimated: 55 mL/min — ABNORMAL LOW (ref >60)
Glucose, Bld: 116 mg/dL — ABNORMAL HIGH (ref 70–99)
Potassium: 3.8 mmol/L (ref 3.5–5.1)
Sodium: 140 mmol/L (ref 135–145)

## 2022-11-04 LAB — CBC
HCT: 34.3 % — ABNORMAL LOW (ref 36.0–46.0)
Hemoglobin: 11.4 g/dL — ABNORMAL LOW (ref 12.0–15.0)
MCH: 27.6 pg (ref 26.0–34.0)
MCHC: 33.2 g/dL (ref 30.0–36.0)
MCV: 83.1 fL (ref 80.0–100.0)
Platelets: 135 10*3/uL — ABNORMAL LOW (ref 150–400)
RBC: 4.13 MIL/uL (ref 3.87–5.11)
RDW: 15.5 % (ref 11.5–15.5)
WBC: 5.7 10*3/uL (ref 4.0–10.5)
nRBC: 0 % (ref 0.0–0.2)

## 2022-11-04 LAB — HEPATIC FUNCTION PANEL
ALT: 23 U/L (ref 0–44)
AST: 16 U/L (ref 15–41)
Albumin: 2.5 g/dL — ABNORMAL LOW (ref 3.5–5.0)
Alkaline Phosphatase: 79 U/L (ref 38–126)
Bilirubin, Direct: 0.2 mg/dL (ref 0.0–0.2)
Indirect Bilirubin: 0.4 mg/dL (ref 0.3–0.9)
Total Bilirubin: 0.6 mg/dL (ref 0.3–1.2)
Total Protein: 5.6 g/dL — ABNORMAL LOW (ref 6.5–8.1)

## 2022-11-04 LAB — MAGNESIUM: Magnesium: 1.8 mg/dL (ref 1.7–2.4)

## 2022-11-04 LAB — PHOSPHORUS: Phosphorus: 3.4 mg/dL (ref 2.5–4.6)

## 2022-11-04 MED ORDER — MAGNESIUM SULFATE 2 GM/50ML IV SOLN
2.0000 g | Freq: Once | INTRAVENOUS | Status: AC
  Administered 2022-11-04: 2 g via INTRAVENOUS
  Filled 2022-11-04: qty 50

## 2022-11-04 MED ORDER — FUROSEMIDE 40 MG PO TABS
40.0000 mg | ORAL_TABLET | Freq: Every day | ORAL | Status: DC
  Administered 2022-11-04: 40 mg via ORAL
  Filled 2022-11-04: qty 1

## 2022-11-04 MED ORDER — POTASSIUM CHLORIDE CRYS ER 20 MEQ PO TBCR: 40.0000 meq | EXTENDED_RELEASE_TABLET | Freq: Once | ORAL | Status: DC

## 2022-11-04 NOTE — Evaluation (Signed)
Physical Therapy Evaluation Patient Details Name: Karen Lutz MRN: [id number] DOB: 12-Jan-1971 Today's Date: 11/04/2022  History of Present Illness  [name] admitted 3/28. Patient was found unresponsive by her boyfriend at home requiring at least 62minutes of CPR before ROSC; apparently in VF arrest. In the ER she was found to be hypokalemic with a potassium of 2.9 with UDS + for benzos. Pt with shock.  VDRF 3/28-3/30.    PMH: obesity s/p gastric bypass bariatric surgery, significant family history of early CAD and early onset CHF  Clinical Impression  Pt admitted with above diagnosis. Pt was able to take steps to  chair with mod assist of 2 to power up to RW and min of 2 to pivot with constant mod cueing.  Pt with poor endurance and may benefit from therapy > 3hours day to return to PLOF as pt was working 2 jobs PTA and was fully independent.  Pt with 2 supportive daughters. Will follow acutely. Pt currently with functional limitations due to the deficits listed below (see PT Problem List). Pt will benefit from acute skilled PT to increase their independence and safety with mobility to allow discharge.          Recommendations for follow up therapy are one component of a multi-disciplinary discharge planning process, led by the attending physician.  Recommendations may be updated based on patient status, additional functional criteria and insurance authorization.  Follow Up Recommendations       Assistance Recommended at Discharge Frequent or constant Supervision/Assistance  Patient can return home with the following  A little help with walking and/or transfers;A little help with bathing/dressing/bathroom;Assistance with cooking/housework;Assist for transportation;Help with stairs or ramp for entrance    Equipment Recommendations Other (comment) (TBA)  Recommendations for Other Services  Rehab consult    Functional Status Assessment Patient has had a recent decline in their functional  status and demonstrates the ability to make significant improvements in function in a reasonable and predictable amount of time.     Precautions / Restrictions Precautions Precautions: Fall Precaution Comments: Cortrax, foley Restrictions Weight Bearing Restrictions: No      Mobility  Bed Mobility Overal bed mobility: Needs Assistance Bed Mobility: Rolling, Sidelying to Sit Rolling: Mod assist, +2 for physical assistance Sidelying to sit: Mod assist, +2 for physical assistance       General bed mobility comments: Pt needed mod assist of 2 to come to eOB with assist for LEs and trunk elevation.    Transfers Overall transfer level: Needs assistance Equipment used: Rolling walker (2 wheels) Transfers: Sit to/from Stand, Bed to chair/wheelchair/BSC Sit to Stand: Mod assist, +2 physical assistance Stand pivot transfers: Min assist, +2 physical assistance         General transfer comment: Pt needed mod assist of 2 to power up and took incr time to stand upright.  Pt needed constant cues to stand fully upright as she remained in slightly flexed posture needing cues to "look up and hold head up" as well as tactile cues for moving RW. Also needed cues to take steps to recliner.    Ambulation/Gait                  Stairs            Wheelchair Mobility    Modified Rankin (Stroke Patients Only)       Balance  Pertinent Vitals/Pain Pain Assessment Pain Assessment: No/denies pain    Home Living Family/patient expects to be discharged to:: Private residence Living Arrangements: Alone Available Help at Discharge: Family;Available 24 hours/day (can go stay with dautghter with 3 steps into house) Type of Home: Apartment (2nd floor apartment) Home Access: Stairs to enter Entrance Stairs-Rails: Right Entrance Stairs-Number of Steps: [place]: One level Home Equipment: None Additional  Comments: Pt works at [employer] and for [employer] Prior Level of Function : Independent/Modified Independent;Driving;Working/employed                     Hand Dominance        Extremity/Trunk Assessment   Upper Extremity Assessment Upper Extremity Assessment: Defer to OT evaluation    Lower Extremity Assessment Lower Extremity Assessment: Generalized weakness       Communication   Communication: Other (comment) (spoke in hushed tone)  Cognition Arousal/Alertness: Awake/alert Behavior During Therapy: Flat affect Overall Cognitive Status: Impaired/Different from baseline Area of Impairment: Orientation, Following commands, Safety/judgement, Awareness, Problem solving                 Orientation Level: Disoriented to, Situation     Following Commands: Follows one step commands with increased time Safety/Judgement: Decreased awareness of safety, Decreased awareness of deficits   Problem Solving: Decreased initiation, Difficulty sequencing, Requires verbal cues, Requires tactile cues General Comments: Pt working 2 jobs PTA.  Pt needing incr commands today and constant cues to focus on task.        General Comments General comments (skin integrity, edema, etc.): VSS    Exercises General Exercises - Lower Extremity Ankle Circles/Pumps: AROM, Both, 10 reps, Seated Long Arc Quad: AROM, Both, 10 reps, Seated Hip Flexion/Marching: AROM, Both, 10 reps, Seated   Assessment/Plan    PT Assessment Patient needs continued PT services  PT Problem List Decreased activity tolerance;Decreased balance;Decreased mobility;Decreased knowledge of use of DME;Decreased safety awareness;Decreased knowledge of precautions;Cardiopulmonary status limiting activity;Obesity       PT Treatment Interventions DME instruction;Gait training;Functional mobility training;Therapeutic activities;Therapeutic exercise;Balance training;Stair training;Patient/family education     PT Goals (Current goals can be found in the Care Plan section)  Acute Rehab PT Goals Patient Stated Goal: to go home PT Goal Formulation: With patient Time For Goal Achievement: 11/18/22 Potential to Achieve Goals: Good    Frequency Min 3X/week     Co-evaluation               AM-PAC PT "6 Clicks" Mobility  Outcome Measure Help needed turning from your back to your side while in a flat bed without using bedrails?: Total Help needed moving from lying on your back to sitting on the side of a flat bed without using bedrails?: Total Help needed moving to and from a bed to a chair (including a wheelchair)?: Total Help needed standing up from a chair using your arms (e.g., wheelchair or bedside chair)?: Total Help needed to walk in hospital room?: Total Help needed climbing 3-5 steps with a railing? : Total 6 Click Score: 6    End of Session Equipment Utilized During Treatment: Gait belt Activity Tolerance: Patient limited by fatigue Patient left: in chair;with call bell/phone within reach;with chair alarm set;with family/visitor present Nurse Communication: Mobility status PT Visit Diagnosis: Muscle weakness (generalized) (M62.81)    Time: [id number] PT Time Calculation (min) (ACUTE ONLY): 25 min   Charges:   PT Evaluation $PT Eval Moderate Complexity: 1  Mod PT Treatments $Therapeutic Activity: 8-22 mins        [facility] M,PT Acute Rehab Services [phone]   Alvira Philips 11/04/2022, 2:57 PM

## 2022-11-04 NOTE — Progress Notes (Signed)
[facility] ADULT ICU REPLACEMENT PROTOCOL   The patient does apply for the [facility] Adult ICU Electrolyte Replacment Protocol based on the criteria listed below:   1.Exclusion criteria: TCTS, ECMO, Dialysis, and Myasthenia Gravis patients 2. Is GFR >/= 30 ml/min? Yes.    Patient's GFR today is 55 3. Is SCr </= 2? Yes.   Patient's SCr is 1.20 mg/dL 4. Did SCr increase >/= 0.5 in 24 hours? No. 5.Pt's weight >40kg  Yes.   6. Abnormal electrolyte(s): Mag 1.8  7. Electrolytes replaced per protocol 8.  Call MD STAT for K+ </= 2.5, Phos </= 1, or Mag </= 1 Physician:  Dr. Burnard Leigh  Carlisle Beers 11/04/2022 5:56 AM

## 2022-11-04 NOTE — Consult Note (Signed)
Advanced Heart Failure Team Consult Note   Primary Physician: Karen Baptist, PA-C PCP-Cardiologist:  None  Reason for Consultation: Acute Systolic Heart Failure, Post OOH VF Arrest   HPI:    [name] w/ hx of obesity s/p bariatric surgery and significant family history of early CAD and early onset CHF currently admitted to [facility] after cardiac arrest at home. As per chart review and family at bedside, patient was found unresponsive by her boyfriend at home requiring at least 65minutes of CPR before ROSC; apparently in VF arrest. In the ER she was found to be hypokalemic with a potassium of 2.9 with UDS + for benzos.   Interval hx: - Extubated - Sitting at bedside chair; remains confused  - 3.3 L urine output past 24h, sCr 1.2  Objective:    Vital Signs:   Temp:  [97.5 F (36.4 C)-98 F (36.7 C)] 98 F (36.7 C) (03/31 0845) Pulse Rate:  [67-112] 112 (03/31 1200) Resp:  [0-32] 19 (03/31 1200) BP: (84-128)/(50-84) 128/84 (03/31 1200) SpO2:  [93 %-100 %] 100 % (03/31 1200) Weight:  [119.3 kg] 119.3 kg (03/31 0450) Last BM Date : 11/04/22  Weight change: Filed Weights   11/02/22 0406 11/03/22 0500 11/04/22 0450  Weight: 116.5 kg 126 kg 119.3 kg    Intake/Output:   Intake/Output Summary (Last 24 hours) at 11/04/2022 1414 Last data filed at 11/04/2022 0700 Gross per 24 hour  Intake 1036.97 ml  Output 1120 ml  Net -83.03 ml       Physical Exam    CVP 5 General:  sitting at bedside chair Neck: supple. Thick neck, JVD not well visualized . + rt IJ CVC Carotids 2+ bilat; no bruits. No lymphadenopathy or thyromegaly appreciated. Cor: PMI nondisplaced. Regular rate & rhythm. No rubs, gallops or murmurs. Lungs: decreased at bases Abdomen: obese, soft, nontender, nondistended. No hepatosplenomegaly. No bruits or masses. Good bowel sounds. Extremities: no cyanosis, clubbing, rash, trace b/l LE edema Neuro: AAO to person, plac GU: + foley    Telemetry   NSR 70s; no  arrhythmias.   EKG    NSR w/ prolonged QTc, 522 ms, inferoseptal q waves   Labs   Basic Metabolic Panel: Recent Labs  Lab 11/01/22 0250 11/01/22 0254 11/01/22 0341 11/01/22 1213 11/01/22 1433 11/02/22 0350 11/02/22 1150 11/02/22 1530 11/03/22 0400 11/04/22 0410  NA 140 143   < > 139 140 137  --  138 137 140  K 2.9* 2.9*   < > 4.8 4.4 4.5  --  3.9 3.4* 3.8  CL 106 106  --  107  --  106  --   --  107 108  CO2 24  --   --  21*  --  23  --   --  22 24  GLUCOSE 175* 166*  --  151*  --  170*  --   --  161* 116*  BUN 9 8  --  11  --  15  --   --  18 20  CREATININE 0.94 1.00  --  1.21*  --  1.29*  --   --  1.33* 1.20*  CALCIUM 8.4*  --   --  8.3*  --  9.0  --   --  9.2 9.0  MG  --   --   --  2.1  --  1.9  --   --  2.1 1.8  PHOS  --   --   --  3.3  --  3.3 3.2  --  3.4 3.4   < > = values in this interval not displayed.     Liver Function Tests: Recent Labs  Lab 11/01/22 0250 11/02/22 0350 11/03/22 0400 11/04/22 0410  AST 209* 50* 23 16  ALT 60* 44 32 23  ALKPHOS 83 83 83 79  BILITOT 0.5 0.7 0.5 0.6  PROT 5.2* 5.3* 5.3* 5.6*  ALBUMIN 2.7* 2.5* 2.4* 2.5*    No results for input(s): "LIPASE", "AMYLASE" in the last 168 hours. No results for input(s): "AMMONIA" in the last 168 hours.  CBC: Recent Labs  Lab 11/01/22 0250 11/01/22 0254 11/01/22 1433 11/02/22 0350 11/02/22 1530 11/03/22 0400 11/04/22 0410  WBC 14.0*  --   --  13.0*  --  7.8 5.7  NEUTROABS 3.8  --   --   --   --   --   --   HGB 12.1   < > 13.9 12.3 12.6 11.6* 11.4*  HCT 38.9   < > 41.0 38.2 37.0 36.7 34.3*  MCV 88.8  --   --  85.5  --  85.2 83.1  PLT 232  --   --  172  --  144* 135*   < > = values in this interval not displayed.     CBG: Recent Labs  Lab 11/03/22 2059 11/04/22 0017 11/04/22 0409 11/04/22 0807 11/04/22 1139  GLUCAP 114* 78 105* 97 85      Imaging   No results found.   Medications:     Current Medications:  aspirin  81 mg Per Tube Daily   Chlorhexidine  Gluconate Cloth  6 each Topical Q0600   docusate  100 mg Per Tube BID   famotidine  20 mg Per Tube BID   heparin  5,000 Units Subcutaneous Q8H   insulin aspart  0-15 Units Subcutaneous Q4H   mouth rinse  15 mL Mouth Rinse 4 times per day   polyethylene glycol  17 g Per Tube Daily   potassium chloride  40 mEq Oral Once   sodium chloride flush  10-40 mL Intracatheter Q12H   thiamine (VITAMIN B1) injection  100 mg Intravenous Daily    Infusions:  feeding supplement (VITAL AF 1.2 CAL) 60 mL/hr at 11/04/22 0700   norepinephrine (LEVOPHED) Adult infusion 1 mcg/min (11/04/22 0855)      Patient Profile   52 y/o [name] w/ h/o obesity s/p bariatric surgery 11/23 at [facility], reported FH of premature CAD (father MI in 28s) and nd FH of CHF (2 sisters). Had Influenza A 12/23. Admitted w/ OOH VF arrest.   Assessment/Plan   1. OOH VF Arrest - total downtime unknown. CRP ~25 min before ROSC - initial K 2.9, Mg 2.1, EKG no acute ST abnormalities, QTc prolonged 511 ms. Hs trop 27>>99>>178 - Echo EF 25% w/ diffuse HK, RV normal  - TTM per CCM; requiring lower temps to maintain TTM; appears to be fevering.  - LV morphology and significant family history of early heart failure are concerning for a genetic component. Will plan for cardiac MRI, coronary angiography and RHC early this next week. Will likely benefit from secondary prevention ICD.  - Extubated today; consult EP tomorrow.   2. Acute Systolic Heart Failure - EF 25% w/ diffuse HK, RV normal - etiology uncertain. ? Preexisting  cardiomyopathy vs myocardial stunning post arrest  - CVP 5 - Continues to require low dose levophed intermittently; will hold off on GDMT today.  - Start PO lasix 40mg  daily  -  Start GDMT tomorrow.  - Repeat CO-OX today; 80 this AM.   3. Acute Hypoxic Respiratory Failure - extubated.   4. AKI - stable renal function.   Length of Stay: [place], DO  11/04/2022, 2:14 PM  Advanced Heart Failure  Team Pager [phone] (M-F; 7a - 5p)  Please contact [facility] for night-coverage after hours (4p -7a ) and weekends on [email or web address]  CRITICAL CARE Performed by: Karen Lutz   Total critical care time: 35 minutes  Critical care time was exclusive of separately billable procedures and treating other patients.  Critical care was necessary to treat or prevent imminent or life-threatening deterioration.  Critical care was time spent personally by me on the following activities: development of treatment plan with patient and/or surrogate as well as nursing, discussions with consultants, evaluation of patient's response to treatment, examination of patient, obtaining history from patient or surrogate, ordering and performing treatments and interventions, ordering and review of laboratory studies, ordering and review of radiographic studies, pulse oximetry and re-evaluation of patient's condition.

## 2022-11-04 NOTE — Progress Notes (Signed)
     NAME:  Karen Lutz, MRN:  [id number], DOB:  05/17/1971, LOS: 3 ADMISSION DATE:  11/01/2022, CONSULTATION DATE:  11/01/2022 REFERRING MD:  Dr. Betsey Holiday, ER, CHIEF COMPLAINT:  Cardiac arrest   History of Present Illness:  52 yo female with recent history of bariatric surgery. She was found by boyfriend unresponsive after falling asleep while watching TV.  EMS called and she was in VF.  Had defibrillation and then went into PEA and VT.  Had defibrillation again and went into SVT.  Given amiodarone and started on Epi gtt.  Given narcan.  About 23 minutes of CPR before ROSC.  Intubated in ER.  Potassium 2.9.  Dyssynchronous with the ventilator in the ED requiring sedation with fentanyl and versed. No wakefulness or purposeful behavior seen post arrest. Requiring epinephrine infusion. PCCM asked to admit to ICU.  Pertinent  Medical History  GERD, Obesity s/p bariatric surgery  Significant Hospital Events: Including procedures, antibiotic start and stop dates in addition to other pertinent events   3/28 Admit s/p cardiac arrest, PICC  Interim History / Subjective:  Levophed needs coming down. Awake, claims she cannot move her arms but nurse says she helped them turn her with her arms.  Also some shifting stories about events surround arrest.  RN to contact [employer] for patient to file police report.  Objective   Blood pressure 96/62, pulse 86, temperature 98 F (36.7 C), temperature source Oral, resp. rate (!) 27, height 5\' 6"  (1.676 m), weight 119.3 kg, SpO2 97 %. CVP:  [1 mmHg-15 mmHg] 2 mmHg      Intake/Output Summary (Last 24 hours) at 11/04/2022 1021 Last data filed at 11/04/2022 0700 Gross per 24 hour  Intake 1462.98 ml  Output 3145 ml  Net -1682.02 ml    Filed Weights   11/02/22 0406 11/03/22 0500 11/04/22 0450  Weight: 116.5 kg 126 kg 119.3 kg   Examination: No distress Not moving arms Moves legs Lungs clear Abd soft  BMP stable Potassium repleted  Resolved Hospital  Problem list    Assessment & Plan:   Shock, suspect myocardial stunning, improving but not resolved VF cardiac arrest: Unknown downtime prior to EMS arrival. 23 mins CPR. VF initial rhythm. EKG not markedly different from prior. CT head read negative.  Etiology unclear at present.  Some question of foul play, RN to contact [employer]. Systolic and diastolic HF> no prior echo to compare, unclear if acute on chronic vs acute Prolonged Qtc Troponin elevation- suspected due to cardiac arrest  Suspected aspiration Post arrest encephalopathy- improved, on TTM Post arrest hypoxemic resp failure Shock liver improved Hx cocaine, etoh use- neg on admit  - Wean levo for MAP 65 - Further cardiac workup, GDMT per cardiology - RE questionable arm weakness will get C spine MRI for completeness - PT/OT - dc abx, 3 days should be fine  Best Practice (right click and "Reselect all SmartList Selections" daily)   Diet/type: cardiac DVT prophylaxis: prophylactic heparin  GI prophylaxis: dc Lines: Central line- PICC Foley: dc Code Status:  full code Last date of multidisciplinary goals of care discussion [ updated patient at bedside]   My cc time 68 min Erskine Emery MD PCCM

## 2022-11-05 ENCOUNTER — Inpatient Hospital Stay (HOSPITAL_COMMUNITY): Payer: BLUE CROSS/BLUE SHIELD

## 2022-11-05 DIAGNOSIS — I5022 Chronic systolic (congestive) heart failure: Secondary | ICD-10-CM | POA: Insufficient documentation

## 2022-11-05 DIAGNOSIS — I5043 Acute on chronic combined systolic (congestive) and diastolic (congestive) heart failure: Secondary | ICD-10-CM | POA: Diagnosis not present

## 2022-11-05 DIAGNOSIS — I5042 Chronic combined systolic (congestive) and diastolic (congestive) heart failure: Secondary | ICD-10-CM | POA: Insufficient documentation

## 2022-11-05 DIAGNOSIS — I4901 Ventricular fibrillation: Secondary | ICD-10-CM | POA: Diagnosis not present

## 2022-11-05 DIAGNOSIS — I5021 Acute systolic (congestive) heart failure: Secondary | ICD-10-CM | POA: Diagnosis not present

## 2022-11-05 DIAGNOSIS — I469 Cardiac arrest, cause unspecified: Secondary | ICD-10-CM | POA: Diagnosis not present

## 2022-11-05 DIAGNOSIS — R739 Hyperglycemia, unspecified: Secondary | ICD-10-CM | POA: Diagnosis not present

## 2022-11-05 DIAGNOSIS — R9431 Abnormal electrocardiogram [ECG] [EKG]: Secondary | ICD-10-CM

## 2022-11-05 LAB — BASIC METABOLIC PANEL
Anion gap: 10 (ref 5–15)
BUN: 17 mg/dL (ref 6–20)
CO2: 25 mmol/L (ref 22–32)
Calcium: 9.3 mg/dL (ref 8.9–10.3)
Chloride: 105 mmol/L (ref 98–111)
Creatinine, Ser: 1.02 mg/dL — ABNORMAL HIGH (ref 0.44–1.00)
GFR, Estimated: >60 mL/min (ref >60)
Glucose, Bld: 105 mg/dL — ABNORMAL HIGH (ref 70–99)
Potassium: 3.5 mmol/L (ref 3.5–5.1)
Sodium: 140 mmol/L (ref 135–145)

## 2022-11-05 LAB — GLUCOSE, CAPILLARY
Glucose-Capillary: 117 mg/dL — ABNORMAL HIGH (ref 70–99)
Glucose-Capillary: 81 mg/dL (ref 70–99)
Glucose-Capillary: 87 mg/dL (ref 70–99)
Glucose-Capillary: 90 mg/dL (ref 70–99)
Glucose-Capillary: 95 mg/dL (ref 70–99)
Glucose-Capillary: 99 mg/dL (ref 70–99)

## 2022-11-05 LAB — CBC
HCT: 35.1 % — ABNORMAL LOW (ref 36.0–46.0)
Hemoglobin: 11 g/dL — ABNORMAL LOW (ref 12.0–15.0)
MCH: 27.1 pg (ref 26.0–34.0)
MCHC: 31.3 g/dL (ref 30.0–36.0)
MCV: 86.5 fL (ref 80.0–100.0)
Platelets: 135 10*3/uL — ABNORMAL LOW (ref 150–400)
RBC: 4.06 MIL/uL (ref 3.87–5.11)
RDW: 15.6 % — ABNORMAL HIGH (ref 11.5–15.5)
WBC: 4.7 10*3/uL (ref 4.0–10.5)
nRBC: 0 % (ref 0.0–0.2)

## 2022-11-05 LAB — COPPER, SERUM: Copper: 131 ug/dL (ref 80–158)

## 2022-11-05 LAB — HEPATIC FUNCTION PANEL
ALT: 17 U/L (ref 0–44)
AST: 14 U/L — ABNORMAL LOW (ref 15–41)
Albumin: 2.5 g/dL — ABNORMAL LOW (ref 3.5–5.0)
Alkaline Phosphatase: 74 U/L (ref 38–126)
Bilirubin, Direct: 0.1 mg/dL (ref 0.0–0.2)
Indirect Bilirubin: 0.4 mg/dL (ref 0.3–0.9)
Total Bilirubin: 0.5 mg/dL (ref 0.3–1.2)
Total Protein: 5.8 g/dL — ABNORMAL LOW (ref 6.5–8.1)

## 2022-11-05 LAB — COOXEMETRY PANEL
Carboxyhemoglobin: 1.7 % — ABNORMAL HIGH (ref 0.5–1.5)
Methemoglobin: <0.7 % (ref 0.0–1.5)
O2 Saturation: 74.2 %
Total hemoglobin: 11.9 g/dL — ABNORMAL LOW (ref 12.0–16.0)

## 2022-11-05 LAB — MAGNESIUM: Magnesium: 2.4 mg/dL (ref 1.7–2.4)

## 2022-11-05 LAB — ZINC: Zinc: 39 ug/dL — ABNORMAL LOW (ref 44–115)

## 2022-11-05 LAB — VITAMIN B1: Vitamin B1 (Thiamine): 246.8 nmol/L — ABNORMAL HIGH (ref 66.5–200.0)

## 2022-11-05 LAB — PHOSPHORUS: Phosphorus: 3.8 mg/dL (ref 2.5–4.6)

## 2022-11-05 MED ORDER — THIAMINE MONONITRATE 100 MG PO TABS
100.0000 mg | ORAL_TABLET | Freq: Every day | ORAL | Status: DC
  Administered 2022-11-07 – 2022-11-10 (×4): 100 mg via ORAL
  Filled 2022-11-05 (×5): qty 1

## 2022-11-05 MED ORDER — SODIUM CHLORIDE 0.9% FLUSH
3.0000 mL | Freq: Two times a day (BID) | INTRAVENOUS | Status: DC
  Administered 2022-11-05 – 2022-11-08 (×6): 3 mL via INTRAVENOUS

## 2022-11-05 MED ORDER — SODIUM CHLORIDE 0.9% FLUSH: 3.0000 mL | INTRAVENOUS | Status: DC | PRN

## 2022-11-05 MED ORDER — METOCLOPRAMIDE HCL 5 MG/ML IJ SOLN
5.0000 mg | Freq: Four times a day (QID) | INTRAMUSCULAR | Status: DC
  Administered 2022-11-05 – 2022-11-06 (×4): 5 mg via INTRAVENOUS
  Filled 2022-11-05 (×4): qty 2

## 2022-11-05 MED ORDER — SPIRONOLACTONE 12.5 MG HALF TABLET
12.5000 mg | ORAL_TABLET | Freq: Every day | ORAL | Status: DC
  Administered 2022-11-05: 12.5 mg via ORAL
  Filled 2022-11-05: qty 1

## 2022-11-05 MED ORDER — FOLIC ACID 1 MG PO TABS
1.0000 mg | ORAL_TABLET | Freq: Every day | ORAL | Status: DC
  Administered 2022-11-05 – 2022-11-10 (×5): 1 mg via ORAL
  Filled 2022-11-05 (×6): qty 1

## 2022-11-05 MED ORDER — SODIUM CHLORIDE 0.9 % IV SOLN: 250.0000 mL | INTRAVENOUS | Status: DC | PRN

## 2022-11-05 MED ORDER — PROCHLORPERAZINE EDISYLATE 10 MG/2ML IJ SOLN
10.0000 mg | INTRAMUSCULAR | Status: DC | PRN
  Administered 2022-11-05: 10 mg via INTRAVENOUS
  Filled 2022-11-05 (×2): qty 2

## 2022-11-05 MED ORDER — ASPIRIN 81 MG PO CHEW
81.0000 mg | CHEWABLE_TABLET | ORAL | Status: AC
  Administered 2022-11-06: 81 mg via ORAL
  Filled 2022-11-05: qty 1

## 2022-11-05 MED ORDER — ORAL CARE MOUTH RINSE: 15.0000 mL | OROMUCOSAL | Status: DC | PRN

## 2022-11-05 MED ORDER — ASPIRIN 81 MG PO CHEW
81.0000 mg | CHEWABLE_TABLET | Freq: Every day | ORAL | Status: DC
  Administered 2022-11-07 – 2022-11-08 (×2): 81 mg
  Filled 2022-11-05 (×3): qty 1

## 2022-11-05 MED ORDER — SODIUM CHLORIDE 0.9 % IV SOLN: INTRAVENOUS | Status: DC

## 2022-11-05 MED ORDER — LOSARTAN POTASSIUM 25 MG PO TABS
12.5000 mg | ORAL_TABLET | Freq: Every day | ORAL | Status: DC
  Administered 2022-11-05 – 2022-11-07 (×2): 12.5 mg via ORAL
  Filled 2022-11-05 (×3): qty 1

## 2022-11-05 MED ORDER — POTASSIUM CHLORIDE 20 MEQ PO PACK
40.0000 meq | PACK | Freq: Once | ORAL | Status: AC
  Administered 2022-11-05: 40 meq
  Filled 2022-11-05: qty 2

## 2022-11-05 NOTE — Progress Notes (Signed)
[facility] ADULT ICU REPLACEMENT PROTOCOL   The patient does apply for the [facility] Adult ICU Electrolyte Replacment Protocol based on the criteria listed below:   1.Exclusion criteria: TCTS, ECMO, Dialysis, and Myasthenia Gravis patients 2. Is GFR >/= 30 ml/min? Yes.    Patient's GFR today is >60 3. Is SCr </= 2? No. Patient's SCr is 1.02 mg/dL 4. Did SCr increase >/= 0.5 in 24 hours? No. 5.Pt's weight >40kg  Yes.   6. Abnormal electrolyte(s): K+3.5  7. Electrolytes replaced per protocol 8.  Call MD STAT for K+ </= 2.5, Phos </= 1, or Mag </= 1 Physician:  Dr. Marijean Bravo, [place] Grumbling 11/05/2022 5:52 AM

## 2022-11-05 NOTE — Progress Notes (Signed)
[place] Progress Note Patient Name: Karen Lutz DOB: 11-02-1970 MRN: [id number]   Date of Service  11/05/2022  HPI/Events of Note  Notified of patient complaining fo breast pain when turned  eICU Interventions  CXR ordered Tylenol given     Intervention Category Intermediate Interventions: Pain - evaluation and management  Judd Lien 11/05/2022, 5:20 AM

## 2022-11-05 NOTE — Progress Notes (Signed)
Heart Failure Navigator Progress Note  Assessed for Heart & Vascular TOC clinic readiness.  Patient does not meet criteria due to Advanced Heart Failure Team patient.   Navigator will sign off at this time.    Joelly Bolanos, BSN, RN Heart Failure Nurse Navigator Secure Chat Only   

## 2022-11-05 NOTE — H&P (View-Only) (Signed)
Advanced Heart Failure Team Consult Note   Primary Physician: Jola Baptist, PA-C PCP-Cardiologist:  None  Reason for Consultation: Acute Systolic Heart Failure, Post OOH VF Arrest   HPI:    [name] w/ hx of obesity s/p bariatric surgery and significant family history of early CAD and early onset CHF currently admitted to [facility] after cardiac arrest at home. As per chart review and family at bedside, patient was found unresponsive by her boyfriend at home requiring at least 17minutes of CPR before ROSC; apparently in VF arrest. In the ER she was found to be hypokalemic with a potassium of 2.9 with UDS + for benzos.   Interval hx: - Extubated. Stable on RA. Now off NE. SBPs 110s.   Reports that she had chest pain in the days leading up to her arrest. Described as chest pressure/tightness. Currently CP free. Complaining of upper back pain. No dyspnea.   - 1.4L in UOP w/ IV Lasix but still slightly net + for the day.   Co-ox pending. CVP 2.   SCr 1.0  Getting TFs    Objective:    Vital Signs:   Temp:  [98 F (36.7 C)-98.3 F (36.8 C)] 98.3 F (36.8 C) (03/31 1545) Pulse Rate:  [91-112] 96 (04/01 0700) Resp:  [19-32] 22 (04/01 0700) BP: (109-135)/(62-99) 111/71 (04/01 0700) SpO2:  [91 %-100 %] 92 % (04/01 0700) Weight:  [116.9 kg] 116.9 kg (04/01 0446) Last BM Date : 11/04/22  Weight change: Filed Weights   11/03/22 0500 11/04/22 0450 11/05/22 0446  Weight: 126 kg 119.3 kg 116.9 kg    Intake/Output:   Intake/Output Summary (Last 24 hours) at 11/05/2022 0802 Last data filed at 11/05/2022 0700 Gross per 24 hour  Intake 1406.64 ml  Output 1400 ml  Net 6.64 ml      Physical Exam    CVP 2  General:  OOB, sitting up in chair. Fatigued appearing. No distress  Neck: supple. Thick neck, JVD not well visualized. Carotids 2+ bilat; no bruits. No lymphadenopathy or thyromegaly appreciated. + cortrak  Cor: PMI nondisplaced. Regular rate & rhythm. No rubs, gallops or  murmurs. Lungs: decreased at bases, no wheezing  Abdomen: obese, soft, nontender, nondistended. No hepatosplenomegaly. No bruits or masses. Good bowel sounds. Extremities: no cyanosis, clubbing, rash, trace b/l LE edema + RUE PICC  Neuro: A&Ox3. Moving all 4 ext w/o difficulty    Telemetry   NSR 70s; no arrhythmias.   EKG   No new EKG to review   Labs   Basic Metabolic Panel: Recent Labs  Lab 11/01/22 1213 11/01/22 1433 11/02/22 0350 11/02/22 1150 11/02/22 1530 11/03/22 0400 11/04/22 0410 11/05/22 0430  NA 139   < > 137  --  138 137 140 140  K 4.8   < > 4.5  --  3.9 3.4* 3.8 3.5  CL 107  --  106  --   --  107 108 105  CO2 21*  --  23  --   --  22 24 25   GLUCOSE 151*  --  170*  --   --  161* 116* 105*  BUN 11  --  15  --   --  18 20 17   CREATININE 1.21*  --  1.29*  --   --  1.33* 1.20* 1.02*  CALCIUM 8.3*  --  9.0  --   --  9.2 9.0 9.3  MG 2.1  --  1.9  --   --  2.1 1.8 2.4  PHOS 3.3  --  3.3 3.2  --  3.4 3.4 3.8   < > = values in this interval not displayed.    Liver Function Tests: Recent Labs  Lab 11/01/22 0250 11/02/22 0350 11/03/22 0400 11/04/22 0410 11/05/22 0430  AST 209* 50* 23 16 14*  ALT 60* 44 32 23 17  ALKPHOS 83 83 83 79 74  BILITOT 0.5 0.7 0.5 0.6 0.5  PROT 5.2* 5.3* 5.3* 5.6* 5.8*  ALBUMIN 2.7* 2.5* 2.4* 2.5* 2.5*   No results for input(s): "LIPASE", "AMYLASE" in the last 168 hours. No results for input(s): "AMMONIA" in the last 168 hours.  CBC: Recent Labs  Lab 11/01/22 0250 11/01/22 0254 11/02/22 0350 11/02/22 1530 11/03/22 0400 11/04/22 0410 11/05/22 0430  WBC 14.0*  --  13.0*  --  7.8 5.7 4.7  NEUTROABS 3.8  --   --   --   --   --   --   HGB 12.1   < > 12.3 12.6 11.6* 11.4* 11.0*  HCT 38.9   < > 38.2 37.0 36.7 34.3* 35.1*  MCV 88.8  --  85.5  --  85.2 83.1 86.5  PLT 232  --  172  --  144* 135* 135*   < > = values in this interval not displayed.    CBG: Recent Labs  Lab 11/04/22 1139 11/04/22 1547 11/04/22 2033  11/04/22 2357 11/05/22 0419  GLUCAP 85 85 110* 98 99     Imaging   DG CHEST PORT 1 VIEW  Result Date: 11/05/2022 CLINICAL DATA:  NG tube placement EXAM: PORTABLE CHEST 1 VIEW COMPARISON:  CXR 11/03/22 FINDINGS: Right arm PICC with tip near the cavoatrial junction/upper right atrium. Endotracheal tube terminates approximately 1.5 cm above the carina. Enteric tube courses below diaphragm with the side hole and tip out of the field of view. No pleural effusion. No pneumothorax. Cardiomegaly. Compared to prior exam there is increased bilateral perihilar opacities, which could be seen in the setting of pulmonary edema or atypical infection. Visualized upper abdomen is unremarkable. No radiographically apparent displaced rib fractures. IMPRESSION: Increased bilateral perihilar opacities, which could be seen in the setting of pulmonary edema. Cardiomegaly. Electronically Signed   By: Marin Roberts M.D.   On: 11/05/2022 07:26   MR CERVICAL SPINE WO CONTRAST  Result Date: 11/04/2022 CLINICAL DATA:  Myelopathy, chronic, cervical spine. EXAM: LIMITED MRI CERVICAL SPINE WITHOUT CONTRAST TECHNIQUE: Sagittal multisequence MR imaging of the cervical spine was performed. Patient was unable to complete the examination. No axial imaging obtained. No intravenous contrast was administered. COMPARISON:  Cervical spine CT 06/24/2020. Cervical spine radiographs 07/26/2022. FINDINGS: Despite efforts by the technologist and patient, moderate motion artifact is present on today's exam and could not be eliminated. This reduces exam sensitivity and specificity. As above, patient unable to complete examination. No axial images obtained. Alignment: Normal. Vertebrae: No evidence of acute fracture or traumatic subluxation. No suspicious focal osseous lesions. Cord: Normal in signal and caliber. No evidence for myelopathy on sagittal imaging. The spinal canal appears widely patent. Posterior Fossa, vertebral arteries, paraspinal  tissues: The posterior fossa appears unremarkable as imaged in the sagittal plane. No paraspinal abnormalities identified on limited sagittal imaging. Disc levels: Sagittal images demonstrate mild spondylosis at C4-5 with asymmetric uncinate spurring on the right. This does not appear to cause more than mild right-sided foraminal narrowing based on the sagittal images. No large disc herniation, cord deformity or high-grade foraminal narrowing identified. IMPRESSION: 1.  Limited examination. The patient was unable to complete the examination due to claustrophobia. No axial images obtained. 2. No evidence of cord deformity, myelopathy or significant spinal stenosis. 3. Mild spondylosis at C4-5 with asymmetric uncinate spurring on the right contributing to mild right-sided foraminal narrowing. No large disc herniation, cord deformity or high-grade foraminal narrowing identified. Electronically Signed   By: Richardean Sale M.D.   On: 11/04/2022 16:47     Medications:     Current Medications:  aspirin  81 mg Per Tube Daily   Chlorhexidine Gluconate Cloth  6 each Topical Q0600   docusate  100 mg Per Tube BID   famotidine  20 mg Per Tube BID   furosemide  40 mg Oral Daily   heparin  5,000 Units Subcutaneous Q8H   insulin aspart  0-15 Units Subcutaneous Q4H   mouth rinse  15 mL Mouth Rinse 4 times per day   polyethylene glycol  17 g Per Tube Daily   potassium chloride  40 mEq Oral Once   sodium chloride flush  10-40 mL Intracatheter Q12H   thiamine (VITAMIN B1) injection  100 mg Intravenous Daily    Infusions:  feeding supplement (VITAL AF 1.2 CAL) 60 mL/hr at 11/05/22 0700   norepinephrine (LEVOPHED) Adult infusion Stopped (11/04/22 1406)      Patient Profile   52 y/o [name] w/ h/o obesity s/p bariatric surgery 11/23 at [facility], reported FH of premature CAD (father MI in 73s) and nd FH of CHF (2 sisters). Had Influenza A 12/23. Admitted w/ OOH VF arrest.   Assessment/Plan   1. OOH VF Arrest -  total downtime unknown. CRP ~25 min before ROSC - initial K 2.9, Mg 2.1, EKG no acute ST abnormalities, QTc prolonged 511 ms. Hs trop 27>>99>>178 - Echo EF 25% w/ diffuse HK, RV normal  - LV morphology and significant family history of early heart failure are concerning for a genetic component. Also reports h/o CP leading up to arrest. Will plan for cardiac MRI, coronary angiography and RHC this next week. Will likely benefit from secondary prevention ICD. Will consult EP.   2. Acute Systolic Heart Failure - EF 25% w/ diffuse HK, RV normal - etiology uncertain. ? Preexisting cardiomyopathy vs myocardial stunning post arrest, vs ischemic CM   - BP stable off pressors. Co-ox pending  - CVP 2. Stop PO Lasix  - Start spiro 12.5 mg daily   3. Acute Hypoxic Respiratory Failure - extubated - stable on RA   4. AKI - stable renal function.   Length of Stay: [address] PA-C  11/05/2022, 8:02 AM  Advanced Heart Failure Team Pager [phone] (M-F; 7a - 5p)  Please contact [facility] for night-coverage after hours (4p -7a ) and weekends on [email or web address]  Patient seen with PA, agree with the above note.   Stable today, off NE.  CVP 3, co-ox 80%.    No dyspnea at rest.   General: NAD Neck: No JVD, no thyromegaly or thyroid nodule.  Lungs: Clear to auscultation bilaterally with normal respiratory effort. CV: Nondisplaced PMI.  Heart regular S1/S2, no S3/S4, no murmur.  No peripheral edema.   Abdomen: Soft, nontender, no hepatosplenomegaly, no distention.  Skin: Intact without lesions or rashes.  Neurologic: Alert and oriented x 3.  Psych: Normal affect. Extremities: No clubbing or cyanosis.  HEENT: Normal.   1. VF arrest: Out of hospital. She was hypokalemic initially with QTc 511 msec.  Unless significant CAD, will likely need secondary prevention  ICD.  - Repeat ECG.  2. Acute systolic CHF: Echo with EF 25%, diffuse hypokinesis, normal RV.  Cause uncertain.  She has multiple  family members with CHF, ?CAD/MIs.  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  Cannot rule out ischemic CMP, she is a smoker.  HS-TnI was mildly elevated with no trend.  Now off NE with CVP 2 and co-ox 80%.  - Add spironolactone 12.5 daily . - Add losartan 12.5 daily. - No Lasix for now.  - RHC/LHC tomorrow, will need MRI if no significant CAD.  3. Nausea: Can add Reglan, give diet.   Mobilize  Loralie Champagne 11/05/2022 9:14 AM

## 2022-11-05 NOTE — Progress Notes (Signed)
[place] Progress Note Patient Name: MAREA DETERT DOB: November 13, 1970 MRN: [id number]   Date of Service  11/05/2022  HPI/Events of Note  Needs something for nausea,  QTC .500  eICU Interventions  Compazine prn ordered x 2 doses     Intervention Category Minor Interventions: Routine modifications to care plan (e.g. PRN medications for pain, fever)  Shona Needles Jaleen Grupp 11/05/2022, 2:27 AM

## 2022-11-05 NOTE — Progress Notes (Signed)
Physical Therapy Treatment Patient Details Name: Karen Lutz MRN: [id number] DOB: 22-Apr-1971 Today's Date: 11/05/2022   History of Present Illness [name] admitted 3/28. Pt found unresponsive at home in cardiac arrest requiring at least 30minutes of CPR before ROSC. Pt hypokalemic UDS + for benzos. VDRF 3/28-3/30. PMH: obesity s/p gastric bypass sx    PT Comments    Pt pleasant and able to progress to hall ambulation with RW today. Pt reports family available to assist and pt will need to be able to ascend flight of stairs prior to D/C. Encouraged continued OOB throughout day and walking with nursing staff. Will plan to further progress gait and stairs for return home.   HR 92-104 SPO2 97% RA   Recommendations for follow up therapy are one component of a multi-disciplinary discharge planning process, led by the attending physician.  Recommendations may be updated based on patient status, additional functional criteria and insurance authorization.  Follow Up Recommendations       Assistance Recommended at Discharge Frequent or constant Supervision/Assistance  Patient can return home with the following A little help with walking and/or transfers;A little help with bathing/dressing/bathroom;Assistance with cooking/housework;Assist for transportation;Help with stairs or ramp for entrance   Equipment Recommendations  Rolling walker (2 wheels);BSC/3in1    Recommendations for Other Services       Precautions / Restrictions Precautions Precautions: Fall Precaution Comments: Cortrak     Mobility  Bed Mobility               General bed mobility comments: in chair on arrival and end of session    Transfers Overall transfer level: Needs assistance   Transfers: Sit to/from Stand Sit to Stand: Min guard           General transfer comment: cues for hand placement to rise from [facility] and to chair    Ambulation/Gait Ambulation/Gait assistance: Min guard Gait Distance  (Feet): 300 Feet Assistive device: Rolling walker (2 wheels) Gait Pattern/deviations: Step-through pattern, Decreased stride length, Trunk flexed, Drifts right/left   Gait velocity interpretation: <1.31 ft/sec, indicative of household ambulator   General Gait Details: pt with tendency to drift right with gait with cues for posture, proximity to RW and direction of RW   Stairs             Wheelchair Mobility    Modified Rankin (Stroke Patients Only)       Balance Overall balance assessment: Needs assistance   Sitting balance-Leahy Scale: Good     Standing balance support: Bilateral upper extremity supported, Reliant on assistive device for balance Standing balance-Leahy Scale: Fair Standing balance comment: RW for gait                            Cognition Arousal/Alertness: Awake/alert Behavior During Therapy: Flat affect Overall Cognitive Status: Impaired/Different from baseline Area of Impairment: Orientation                 Orientation Level: Disoriented to, Time     Following Commands: Follows one step commands consistently Safety/Judgement: Decreased awareness of safety, Decreased awareness of deficits   Problem Solving: Slow processing          Exercises General Exercises - Lower Extremity Long Arc Quad: AROM, Both, Seated, 15 reps Hip Flexion/Marching: AROM, Both, Seated, 15 reps    General Comments        Pertinent Vitals/Pain Pain Assessment Pain Assessment: 0-10 Pain Score: 4  Pain  Location: chest from compressions Pain Descriptors / Indicators: Aching, Guarding Pain Intervention(s): Limited activity within patient's tolerance, Repositioned, Monitored during session    Home Living                          Prior Function            PT Goals (current goals can now be found in the care plan section) Progress towards PT goals: Progressing toward goals    Frequency    Min 1X/week      PT Plan  Discharge plan needs to be updated    Co-evaluation              AM-PAC PT "6 Clicks" Mobility   Outcome Measure  Help needed turning from your back to your side while in a flat bed without using bedrails?: A Lot Help needed moving from lying on your back to sitting on the side of a flat bed without using bedrails?: A Lot Help needed moving to and from a bed to a chair (including a wheelchair)?: A Little Help needed standing up from a chair using your arms (e.g., wheelchair or bedside chair)?: A Little Help needed to walk in hospital room?: A Little Help needed climbing 3-5 steps with a railing? : A Lot 6 Click Score: 15    End of Session Equipment Utilized During Treatment: Gait belt Activity Tolerance: Patient tolerated treatment well Patient left: in chair;with call bell/phone within reach;with family/visitor present;with nursing/sitter in room Nurse Communication: Mobility status PT Visit Diagnosis: Other abnormalities of gait and mobility (R26.89);Muscle weakness (generalized) (M62.81)     Time: [id number] PT Time Calculation (min) (ACUTE ONLY): 19 min  Charges:  $Gait Training: 8-22 mins                     Bayard Males, PT Acute Rehabilitation Services Office: [place] 11/05/2022, 9:38 AM

## 2022-11-05 NOTE — Progress Notes (Addendum)
NAME:  Karen Lutz, MRN:  [id number], DOB:  [birth year], LOS: 4 ADMISSION DATE:  11/01/2022, CONSULTATION DATE:  11/01/2022 REFERRING MD:  Dr. Betsey Holiday, ER, CHIEF COMPLAINT:  Cardiac arrest   History of Present Illness:  52 yo female with recent history of bariatric surgery. She was found by boyfriend unresponsive after falling asleep while watching TV.  EMS called and she was in VF.  Had defibrillation and then went into PEA and VT.  Had defibrillation again and went into SVT.  Given amiodarone and started on Epi gtt.  Given narcan.  About 23 minutes of CPR before ROSC.  Intubated in ER.  Potassium 2.9.  Dyssynchronous with the ventilator in the ED requiring sedation with fentanyl and versed. No wakefulness or purposeful behavior seen post arrest. Requiring epinephrine infusion. PCCM asked to admit to ICU.  Pertinent  Medical History  GERD, Obesity s/p bariatric surgery  Significant Hospital Events: Including procedures, antibiotic start and stop dates in addition to other pertinent events   3/28 Admit s/p cardiac arrest, PICC  Interim History / Subjective:  Complained of pain and nausea overnight  Off pressors No chest pain   Objective   Blood pressure 111/71, pulse 96, temperature 98.3 F (36.8 C), temperature source Oral, resp. rate (!) 22, height 5\' 6"  (1.676 m), weight 116.9 kg, SpO2 92 %. CVP:  [0 mmHg-13 mmHg] 8 mmHg      Intake/Output Summary (Last 24 hours) at 11/05/2022 [id number] Last data filed at 11/05/2022 0700 Gross per 24 hour  Intake 1406.64 ml  Output 1400 ml  Net 6.64 ml    Filed Weights   11/03/22 0500 11/04/22 0450 11/05/22 0446  Weight: 126 kg 119.3 kg 116.9 kg   General:  overweight F, sitting up in chair in no acute distres HEENT: MM pink/moist Neuro: awake, alert, following commands CV: s1s2 rrr, no m/r/g PULM:  clear bilaterally without rhonchi or wheezing GI: soft, bsx4 active  Extremities: warm/dry, no edema  Skin: no rashes or lesions MSK: mild  L lateral back pain, not new per patient   Resolved Hospital Problem list   Post arrest hypoxemic resp failure Shock Liver  Assessment & Plan:    Unwitnessed, out of hospital VF cardiac arrest Shock Systolic and diastolic HF Prolonged Qtc, troponin elevation Shock improved, off pressors, suspected myocardial stunning EF 25%, coox pending today, 80% yesterday -plan per cardiology cardiac cath this week and possible cardiac MRI -continue spironolactone -completed TTM -clinically improving, continue mobilization and diet progression, possibly d/c TF  -continue Asa -continue tele and Qtc monitoring -prior notes question etiology of arrest  -replace electrolytes prn    Suspected aspiration -abx completed, off supplemental O2   Post arrest encephalopathy Hx cocaine and ETOH use -improving, slightly slow to answer questions and affect depressed but appropriate -negative for substances on admission, continue thiamine daily    Neck and back pain -MRI C-spine negative for acute findings, back pain is not midline and not new per patient -tylenol prn   Thrombocytopenia  -Stable at 135, no signs of bleeding possibly secondary to critical illness, ETOH  Best Practice (right click and "Reselect all SmartList Selections" daily)   Diet/type: cardiac DVT prophylaxis: prophylactic heparin  GI prophylaxis: dc Lines: Central line- PICC Foley: n/a Code Status:  full code Last date of multidisciplinary goals of care discussion [ updated patient and family at bedside]   [name], PA-C [facility] See Amion for pager If no response  to pager , please call [phone] until 7pm After 7:00 pm call Elink  [id number]?[place]

## 2022-11-05 NOTE — Progress Notes (Addendum)
Advanced Heart Failure Team Consult Note   Primary Physician: Jola Baptist, PA-C PCP-Cardiologist:  None  Reason for Consultation: Acute Systolic Heart Failure, Post OOH VF Arrest   HPI:    [name] w/ hx of obesity s/p bariatric surgery and significant family history of early CAD and early onset CHF currently admitted to [facility] after cardiac arrest at home. As per chart review and family at bedside, patient was found unresponsive by her boyfriend at home requiring at least 67minutes of CPR before ROSC; apparently in VF arrest. In the ER she was found to be hypokalemic with a potassium of 2.9 with UDS + for benzos.   Interval hx: - Extubated. Stable on RA. Now off NE. SBPs 110s.   Reports that she had chest pain in the days leading up to her arrest. Described as chest pressure/tightness. Currently CP free. Complaining of upper back pain. No dyspnea.   - 1.4L in UOP w/ IV Lasix but still slightly net + for the day.   Co-ox pending. CVP 2.   SCr 1.0  Getting TFs    Objective:    Vital Signs:   Temp:  [98 F (36.7 C)-98.3 F (36.8 C)] 98.3 F (36.8 C) (03/31 1545) Pulse Rate:  [91-112] 96 (04/01 0700) Resp:  [19-32] 22 (04/01 0700) BP: (109-135)/(62-99) 111/71 (04/01 0700) SpO2:  [91 %-100 %] 92 % (04/01 0700) Weight:  [116.9 kg] 116.9 kg (04/01 0446) Last BM Date : 11/04/22  Weight change: Filed Weights   11/03/22 0500 11/04/22 0450 11/05/22 0446  Weight: 126 kg 119.3 kg 116.9 kg    Intake/Output:   Intake/Output Summary (Last 24 hours) at 11/05/2022 0802 Last data filed at 11/05/2022 0700 Gross per 24 hour  Intake 1406.64 ml  Output 1400 ml  Net 6.64 ml      Physical Exam    CVP 2  General:  OOB, sitting up in chair. Fatigued appearing. No distress  Neck: supple. Thick neck, JVD not well visualized. Carotids 2+ bilat; no bruits. No lymphadenopathy or thyromegaly appreciated. + cortrak  Cor: PMI nondisplaced. Regular rate & rhythm. No rubs, gallops or  murmurs. Lungs: decreased at bases, no wheezing  Abdomen: obese, soft, nontender, nondistended. No hepatosplenomegaly. No bruits or masses. Good bowel sounds. Extremities: no cyanosis, clubbing, rash, trace b/l LE edema + RUE PICC  Neuro: A&Ox3. Moving all 4 ext w/o difficulty    Telemetry   NSR 70s; no arrhythmias.   EKG   No new EKG to review   Labs   Basic Metabolic Panel: Recent Labs  Lab 11/01/22 1213 11/01/22 1433 11/02/22 0350 11/02/22 1150 11/02/22 1530 11/03/22 0400 11/04/22 0410 11/05/22 0430  NA 139   < > 137  --  138 137 140 140  K 4.8   < > 4.5  --  3.9 3.4* 3.8 3.5  CL 107  --  106  --   --  107 108 105  CO2 21*  --  23  --   --  22 24 25   GLUCOSE 151*  --  170*  --   --  161* 116* 105*  BUN 11  --  15  --   --  18 20 17   CREATININE 1.21*  --  1.29*  --   --  1.33* 1.20* 1.02*  CALCIUM 8.3*  --  9.0  --   --  9.2 9.0 9.3  MG 2.1  --  1.9  --   --  2.1 1.8 2.4  PHOS 3.3  --  3.3 3.2  --  3.4 3.4 3.8   < > = values in this interval not displayed.    Liver Function Tests: Recent Labs  Lab 11/01/22 0250 11/02/22 0350 11/03/22 0400 11/04/22 0410 11/05/22 0430  AST 209* 50* 23 16 14*  ALT 60* 44 32 23 17  ALKPHOS 83 83 83 79 74  BILITOT 0.5 0.7 0.5 0.6 0.5  PROT 5.2* 5.3* 5.3* 5.6* 5.8*  ALBUMIN 2.7* 2.5* 2.4* 2.5* 2.5*   No results for input(s): "LIPASE", "AMYLASE" in the last 168 hours. No results for input(s): "AMMONIA" in the last 168 hours.  CBC: Recent Labs  Lab 11/01/22 0250 11/01/22 0254 11/02/22 0350 11/02/22 1530 11/03/22 0400 11/04/22 0410 11/05/22 0430  WBC 14.0*  --  13.0*  --  7.8 5.7 4.7  NEUTROABS 3.8  --   --   --   --   --   --   HGB 12.1   < > 12.3 12.6 11.6* 11.4* 11.0*  HCT 38.9   < > 38.2 37.0 36.7 34.3* 35.1*  MCV 88.8  --  85.5  --  85.2 83.1 86.5  PLT 232  --  172  --  144* 135* 135*   < > = values in this interval not displayed.    CBG: Recent Labs  Lab 11/04/22 1139 11/04/22 1547 11/04/22 2033  11/04/22 2357 11/05/22 0419  GLUCAP 85 85 110* 98 99     Imaging   DG CHEST PORT 1 VIEW  Result Date: 11/05/2022 CLINICAL DATA:  NG tube placement EXAM: PORTABLE CHEST 1 VIEW COMPARISON:  CXR 11/03/22 FINDINGS: Right arm PICC with tip near the cavoatrial junction/upper right atrium. Endotracheal tube terminates approximately 1.5 cm above the carina. Enteric tube courses below diaphragm with the side hole and tip out of the field of view. No pleural effusion. No pneumothorax. Cardiomegaly. Compared to prior exam there is increased bilateral perihilar opacities, which could be seen in the setting of pulmonary edema or atypical infection. Visualized upper abdomen is unremarkable. No radiographically apparent displaced rib fractures. IMPRESSION: Increased bilateral perihilar opacities, which could be seen in the setting of pulmonary edema. Cardiomegaly. Electronically Signed   By: Marin Roberts M.D.   On: 11/05/2022 07:26   MR CERVICAL SPINE WO CONTRAST  Result Date: 11/04/2022 CLINICAL DATA:  Myelopathy, chronic, cervical spine. EXAM: LIMITED MRI CERVICAL SPINE WITHOUT CONTRAST TECHNIQUE: Sagittal multisequence MR imaging of the cervical spine was performed. Patient was unable to complete the examination. No axial imaging obtained. No intravenous contrast was administered. COMPARISON:  Cervical spine CT 06/24/2020. Cervical spine radiographs 07/26/2022. FINDINGS: Despite efforts by the technologist and patient, moderate motion artifact is present on today's exam and could not be eliminated. This reduces exam sensitivity and specificity. As above, patient unable to complete examination. No axial images obtained. Alignment: Normal. Vertebrae: No evidence of acute fracture or traumatic subluxation. No suspicious focal osseous lesions. Cord: Normal in signal and caliber. No evidence for myelopathy on sagittal imaging. The spinal canal appears widely patent. Posterior Fossa, vertebral arteries, paraspinal  tissues: The posterior fossa appears unremarkable as imaged in the sagittal plane. No paraspinal abnormalities identified on limited sagittal imaging. Disc levels: Sagittal images demonstrate mild spondylosis at C4-5 with asymmetric uncinate spurring on the right. This does not appear to cause more than mild right-sided foraminal narrowing based on the sagittal images. No large disc herniation, cord deformity or high-grade foraminal narrowing identified. IMPRESSION: 1.  Limited examination. The patient was unable to complete the examination due to claustrophobia. No axial images obtained. 2. No evidence of cord deformity, myelopathy or significant spinal stenosis. 3. Mild spondylosis at C4-5 with asymmetric uncinate spurring on the right contributing to mild right-sided foraminal narrowing. No large disc herniation, cord deformity or high-grade foraminal narrowing identified. Electronically Signed   By: Richardean Sale M.D.   On: 11/04/2022 16:47     Medications:     Current Medications:  aspirin  81 mg Per Tube Daily   Chlorhexidine Gluconate Cloth  6 each Topical Q0600   docusate  100 mg Per Tube BID   famotidine  20 mg Per Tube BID   furosemide  40 mg Oral Daily   heparin  5,000 Units Subcutaneous Q8H   insulin aspart  0-15 Units Subcutaneous Q4H   mouth rinse  15 mL Mouth Rinse 4 times per day   polyethylene glycol  17 g Per Tube Daily   potassium chloride  40 mEq Oral Once   sodium chloride flush  10-40 mL Intracatheter Q12H   thiamine (VITAMIN B1) injection  100 mg Intravenous Daily    Infusions:  feeding supplement (VITAL AF 1.2 CAL) 60 mL/hr at 11/05/22 0700   norepinephrine (LEVOPHED) Adult infusion Stopped (11/04/22 1406)      Patient Profile   52 y/o [name] w/ h/o obesity s/p bariatric surgery 11/23 at [facility], reported FH of premature CAD (father MI in 66s) and nd FH of CHF (2 sisters). Had Influenza A 12/23. Admitted w/ OOH VF arrest.   Assessment/Plan   1. OOH VF Arrest -  total downtime unknown. CRP ~25 min before ROSC - initial K 2.9, Mg 2.1, EKG no acute ST abnormalities, QTc prolonged 511 ms. Hs trop 27>>99>>178 - Echo EF 25% w/ diffuse HK, RV normal  - LV morphology and significant family history of early heart failure are concerning for a genetic component. Also reports h/o CP leading up to arrest. Will plan for cardiac MRI, coronary angiography and RHC this next week. Will likely benefit from secondary prevention ICD. Will consult EP.   2. Acute Systolic Heart Failure - EF 25% w/ diffuse HK, RV normal - etiology uncertain. ? Preexisting cardiomyopathy vs myocardial stunning post arrest, vs ischemic CM   - BP stable off pressors. Co-ox pending  - CVP 2. Stop PO Lasix  - Start spiro 12.5 mg daily   3. Acute Hypoxic Respiratory Failure - extubated - stable on RA   4. AKI - stable renal function.   Length of Stay: [address] PA-C  11/05/2022, 8:02 AM  Advanced Heart Failure Team Pager [phone] (M-F; 7a - 5p)  Please contact [facility] for night-coverage after hours (4p -7a ) and weekends on [email or web address]  Patient seen with PA, agree with the above note.   Stable today, off NE.  CVP 3, co-ox 80%.    No dyspnea at rest.   General: NAD Neck: No JVD, no thyromegaly or thyroid nodule.  Lungs: Clear to auscultation bilaterally with normal respiratory effort. CV: Nondisplaced PMI.  Heart regular S1/S2, no S3/S4, no murmur.  No peripheral edema.   Abdomen: Soft, nontender, no hepatosplenomegaly, no distention.  Skin: Intact without lesions or rashes.  Neurologic: Alert and oriented x 3.  Psych: Normal affect. Extremities: No clubbing or cyanosis.  HEENT: Normal.   1. VF arrest: Out of hospital. She was hypokalemic initially with QTc 511 msec.  Unless significant CAD, will likely need secondary prevention  ICD.  - Repeat ECG.  2. Acute systolic CHF: Echo with EF 25%, diffuse hypokinesis, normal RV.  Cause uncertain.  She has multiple  family members with CHF, ?CAD/MIs.  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  Cannot rule out ischemic CMP, she is a smoker.  HS-TnI was mildly elevated with no trend.  Now off NE with CVP 2 and co-ox 80%.  - Add spironolactone 12.5 daily . - Add losartan 12.5 daily. - No Lasix for now.  - RHC/LHC tomorrow, will need MRI if no significant CAD.  3. Nausea: Can add Reglan, give diet.   Mobilize  Loralie Champagne 11/05/2022 9:14 AM

## 2022-11-05 NOTE — Progress Notes (Signed)
Nutrition Follow-up  DOCUMENTATION CODES:  Morbid obesity  INTERVENTION:  Encourage PO intake TF via cortrak. Will decrease rate to 57mL/h to monitor for better tolerance and reassess the need to change to nocturnal after heart cath tomorrow. Vital AF at 30 ml/h (720 ml per day) Provides 864 kcal, 54 gm protein, 584 ml free water daily Monitor results of micronutrient labs: Vitamin A, Zinc, copper, thiamine Resume vitamin and mineral regimen for hx of bariatric surgery. 1mg  of folic acid daily [id number] days for deficiency  NUTRITION DIAGNOSIS:   Impaired nutrient utilization related to altered GI function as evidenced by  (hx of bariatric surgery). - remains applicable  GOAL:   Patient will meet greater than or equal to 90% of their needs - progressing, diet in place, cortrak in place  MONITOR:   TF tolerance, I & O's, Vent status, Labs  REASON FOR ASSESSMENT:   Ventilator, Consult Enteral/tube feeding initiation and management (recent bariatric surgery)  ASSESSMENT:   Pt with hx of GERD and SADI-S bariatric surgery (06/2022) presented to ED after becoming unresponsive at home. EMS arrived and found pt to be in cardiac arrest.    3/28 - intubated on arrival to ED 3/29 - cortrak placed - terminates out of the gastric sleeve in the small bowel 3/30 - extubated  Pt working with PT at the time of assessment.   Cortrak still in place but feeds on hold. Discussed TF with nursing team. State that pt not tolerating TF well and that she is having nausea even with medication administration. Has been refusing to eat due to nausea. Request that feeds be adjusted to nocturnal. Noted that pt to be NPO at midnight for RHC/LHC tomorrow and if adjusted to nocturnal would only receive a few hours of feeds. Will cut rate in half to 53mL and after procedure can reevaluate the need to adjust to nocturnal vs continue with 1/2 rate or full feeds.   Nursing reports pt more oriented today.  Heart  failure team entered 2g Na diet with 2L fluid restriction this AM. Will also reevaluate the need for diet liberalizations once diet is re-instated tomorrow.   Some micronutrient labs have returned, will continue to monitor for the remainder.    Intake/Output Summary (Last 24 hours) at 11/05/2022 1158 Last data filed at 11/05/2022 0900 Gross per 24 hour  Intake 1316.64 ml  Output 1400 ml  Net -83.36 ml  Net IO Since Admission: 185.78 mL [11/05/22 1158]   Nutritionally Relevant Medications: Scheduled Meds:  docusate  100 mg Per Tube BID   famotidine  20 mg Per Tube BID   insulin aspart  0-15 Units Subcutaneous Q4H   metoCLOPramide (REGLAN) injection  5 mg Intravenous Q6H   polyethylene glycol  17 g Per Tube Daily   potassium chloride  40 mEq Oral Once   spironolactone  12.5 mg Oral Daily   [START ON 11/06/2022] thiamine  100 mg Oral Daily   Continuous Infusions:  feeding supplement (VITAL AF 1.2 CAL) 60 mL/hr at 11/05/22 0700   norepinephrine (LEVOPHED) Adult infusion Stopped (11/04/22 1406)   PRN Meds: prochlorperazine  Labs Reviewed: Creatinine 1.02 CBG ranges from 85-110 mg/dL over the last 24 hours  Micronutrient Profile: CRP 26 Vitamin D 31.72 (wnl) Vitamin B12 710 Folate 1.8 (low) Vitamin C pending Zinc pending Copper pending Vitamin B1 pending  NUTRITION - FOCUSED PHYSICAL EXAM: Flowsheet Row Most Recent Value  Orbital Region No depletion  Upper Arm Region No depletion  Thoracic and Lumbar  Region No depletion  Buccal Region No depletion  Temple Region No depletion  Clavicle Bone Region No depletion  Clavicle and Acromion Bone Region No depletion  Scapular Bone Region No depletion  Dorsal Hand No depletion  Patellar Region No depletion  Anterior Thigh Region No depletion  Posterior Calf Region No depletion  Edema (RD Assessment) None  Hair Reviewed  Eyes Reviewed  Mouth Reviewed  Skin Reviewed  Nails Reviewed    Diet Order:   Diet Order              Diet 2 gram sodium Room service appropriate? Yes; Fluid consistency: Thin; Fluid restriction: 2000 mL Fluid  Diet effective now                   EDUCATION NEEDS:  Not appropriate for education at this time  Skin:  Skin Assessment: Reviewed RN Assessment  Last BM:  4/1 - type 7  Height:  Ht Readings from Last 1 Encounters:  11/01/22 5\' 6"  (1.676 m)    Weight:  Wt Readings from Last 1 Encounters:  11/05/22 116.9 kg    Ideal Body Weight:  59.1 kg  BMI:  Body mass index is 41.6 kg/m.  Estimated Nutritional Needs:  Kcal:  1700-2000 kcal/d Protein:  90-120 g/d Fluid:  >/=1.8 L/d    Ranell Patrick, RD, LDN Clinical Dietitian RD pager # available in [place]  After hours/weekend pager # available in [facility]

## 2022-11-05 NOTE — Progress Notes (Signed)
Hypoglycemic Event  CBG: 66   Treatment: 4 oz juice/soda  Symptoms: None  Follow-up CBG: Time:2057 CBG Result: 87  Possible Reasons for Event: Inadequate meal intake   [name]

## 2022-11-05 NOTE — TOC Progression Note (Signed)
Transition of Care [facility]) - Progression Note    Patient Details  Name: Karen Lutz MRN: [id number] Date of Birth: [birth year]  Transition of Care [facility]) CM/SW Contact  Erenest Rasher, RN Phone Number: [phone] 11/05/2022, 4:25 PM  Clinical Narrative:      CM spoke to sister, Odette Horns and she does want RW and bedside commode for home. Will order DME 1-2 days prior to dc   Expected Discharge Plan and Services     Social Determinants of Health (SDOH) Interventions SDOH Screenings   Food Insecurity: No Food Insecurity (11/02/2022)  Housing: Low Risk  (11/02/2022)  Transportation Needs: No Transportation Needs (11/02/2022)  Utilities: Not At Risk (11/02/2022)  Financial Resource Strain: Low Risk  (11/02/2022)  Tobacco Use: Low Risk  (11/01/2022)    Readmission Risk Interventions     No data to display

## 2022-11-06 ENCOUNTER — Encounter (HOSPITAL_COMMUNITY)
Admission: EM | Disposition: A | Discharge status: Home / Self Care | Payer: Self-pay | Source: Home / Self Care | Attending: Internal Medicine

## 2022-11-06 ENCOUNTER — Encounter (HOSPITAL_COMMUNITY): Payer: Self-pay | Admitting: Cardiology

## 2022-11-06 DIAGNOSIS — I429 Cardiomyopathy, unspecified: Secondary | ICD-10-CM | POA: Diagnosis not present

## 2022-11-06 DIAGNOSIS — I5043 Acute on chronic combined systolic (congestive) and diastolic (congestive) heart failure: Secondary | ICD-10-CM

## 2022-11-06 HISTORY — PX: RIGHT/LEFT HEART CATH AND CORONARY ANGIOGRAPHY: CATH118266

## 2022-11-06 LAB — GLUCOSE, CAPILLARY
Glucose-Capillary: 66 mg/dL — ABNORMAL LOW (ref 70–99)
Glucose-Capillary: 69 mg/dL — ABNORMAL LOW (ref 70–99)
Glucose-Capillary: 70 mg/dL (ref 70–99)
Glucose-Capillary: 82 mg/dL (ref 70–99)
Glucose-Capillary: 88 mg/dL (ref 70–99)
Glucose-Capillary: 91 mg/dL (ref 70–99)

## 2022-11-06 LAB — BASIC METABOLIC PANEL
Anion gap: 9 (ref 5–15)
BUN: 12 mg/dL (ref 6–20)
CO2: 24 mmol/L (ref 22–32)
Calcium: 9.4 mg/dL (ref 8.9–10.3)
Chloride: 106 mmol/L (ref 98–111)
Creatinine, Ser: 0.98 mg/dL (ref 0.44–1.00)
GFR, Estimated: >60 mL/min (ref >60)
Glucose, Bld: 86 mg/dL (ref 70–99)
Potassium: 3.6 mmol/L (ref 3.5–5.1)
Sodium: 139 mmol/L (ref 135–145)

## 2022-11-06 LAB — POCT I-STAT EG7
Acid-Base Excess: 1 mmol/L (ref 0.0–2.0)
Acid-Base Excess: 1 mmol/L (ref 0.0–2.0)
Bicarbonate: 26.8 mmol/L (ref 20.0–28.0)
Bicarbonate: 27.8 mmol/L (ref 20.0–28.0)
Calcium, Ion: 1.43 mmol/L — ABNORMAL HIGH (ref 1.15–1.40)
Calcium, Ion: 1.46 mmol/L — ABNORMAL HIGH (ref 1.15–1.40)
HCT: 33 % — ABNORMAL LOW (ref 36.0–46.0)
HCT: 34 % — ABNORMAL LOW (ref 36.0–46.0)
Hemoglobin: 11.2 g/dL — ABNORMAL LOW (ref 12.0–15.0)
Hemoglobin: 11.6 g/dL — ABNORMAL LOW (ref 12.0–15.0)
O2 Saturation: 66 %
O2 Saturation: 66 %
Potassium: 3.8 mmol/L (ref 3.5–5.1)
Potassium: 3.9 mmol/L (ref 3.5–5.1)
Sodium: 143 mmol/L (ref 135–145)
Sodium: 143 mmol/L (ref 135–145)
TCO2: 28 mmol/L (ref 22–32)
TCO2: 29 mmol/L (ref 22–32)
pCO2, Ven: 48.7 mmHg (ref 44–60)
pCO2, Ven: 50.7 mmHg (ref 44–60)
pH, Ven: 7.347 (ref 7.25–7.43)
pH, Ven: 7.349 (ref 7.25–7.43)
pO2, Ven: 36 mmHg (ref 32–45)
pO2, Ven: 37 mmHg (ref 32–45)

## 2022-11-06 LAB — CBC
HCT: 36.2 % (ref 36.0–46.0)
Hemoglobin: 11.1 g/dL — ABNORMAL LOW (ref 12.0–15.0)
MCH: 26.9 pg (ref 26.0–34.0)
MCHC: 30.7 g/dL (ref 30.0–36.0)
MCV: 87.7 fL (ref 80.0–100.0)
Platelets: 142 10*3/uL — ABNORMAL LOW (ref 150–400)
RBC: 4.13 MIL/uL (ref 3.87–5.11)
RDW: 15.4 % (ref 11.5–15.5)
WBC: 5 10*3/uL (ref 4.0–10.5)
nRBC: 0 % (ref 0.0–0.2)

## 2022-11-06 LAB — CULTURE, BLOOD (ROUTINE X 2)
Culture: NO GROWTH
Culture: NO GROWTH
Special Requests: ADEQUATE

## 2022-11-06 LAB — TSH: TSH: 2.716 u[IU]/mL (ref 0.350–4.500)

## 2022-11-06 LAB — PHOSPHORUS: Phosphorus: 3.8 mg/dL (ref 2.5–4.6)

## 2022-11-06 LAB — VITAMIN A: Vitamin A (Retinoic Acid): 4.4 ug/dL — ABNORMAL LOW (ref 20.1–62.0)

## 2022-11-06 LAB — MAGNESIUM: Magnesium: 2.1 mg/dL (ref 1.7–2.4)

## 2022-11-06 SURGERY — RIGHT/LEFT HEART CATH AND CORONARY ANGIOGRAPHY
Anesthesia: LOCAL

## 2022-11-06 MED ORDER — POTASSIUM CHLORIDE CRYS ER 20 MEQ PO TBCR
40.0000 meq | EXTENDED_RELEASE_TABLET | Freq: Two times a day (BID) | ORAL | Status: AC
  Administered 2022-11-06 (×2): 40 meq via ORAL
  Filled 2022-11-06 (×2): qty 2

## 2022-11-06 MED ORDER — LABETALOL HCL 5 MG/ML IV SOLN: 10.0000 mg | INTRAVENOUS | Status: AC | PRN

## 2022-11-06 MED ORDER — MIDAZOLAM HCL 2 MG/2ML IJ SOLN
INTRAMUSCULAR | Status: AC
  Filled 2022-11-06: qty 2

## 2022-11-06 MED ORDER — LIDOCAINE HCL (PF) 1 % IJ SOLN
INTRAMUSCULAR | Status: DC | PRN
  Administered 2022-11-06: 5 mL

## 2022-11-06 MED ORDER — IOHEXOL 350 MG/ML SOLN
INTRAVENOUS | Status: DC | PRN
  Administered 2022-11-06: 60 mL

## 2022-11-06 MED ORDER — VERAPAMIL HCL 2.5 MG/ML IV SOLN
INTRAVENOUS | Status: AC
  Filled 2022-11-06: qty 2

## 2022-11-06 MED ORDER — SODIUM CHLORIDE 0.9% FLUSH: 3.0000 mL | INTRAVENOUS | Status: DC | PRN

## 2022-11-06 MED ORDER — HEPARIN SODIUM (PORCINE) 1000 UNIT/ML IJ SOLN
INTRAMUSCULAR | Status: AC
  Filled 2022-11-06: qty 10

## 2022-11-06 MED ORDER — ENOXAPARIN SODIUM 40 MG/0.4ML IJ SOSY
40.0000 mg | PREFILLED_SYRINGE | INTRAMUSCULAR | Status: DC
  Administered 2022-11-07: 40 mg via SUBCUTANEOUS
  Filled 2022-11-06: qty 0.4

## 2022-11-06 MED ORDER — HEPARIN (PORCINE) IN NACL 1000-0.9 UT/500ML-% IV SOLN
INTRAVENOUS | Status: DC | PRN
  Administered 2022-11-06 (×2): 500 mL

## 2022-11-06 MED ORDER — VERAPAMIL HCL 2.5 MG/ML IV SOLN
INTRAVENOUS | Status: DC | PRN
  Administered 2022-11-06: 10 mL via INTRA_ARTERIAL

## 2022-11-06 MED ORDER — POTASSIUM CHLORIDE CRYS ER 20 MEQ PO TBCR: 40.0000 meq | EXTENDED_RELEASE_TABLET | Freq: Once | ORAL | Status: DC

## 2022-11-06 MED ORDER — FUROSEMIDE 10 MG/ML IJ SOLN
60.0000 mg | Freq: Two times a day (BID) | INTRAMUSCULAR | Status: DC
  Administered 2022-11-06 – 2022-11-07 (×3): 60 mg via INTRAVENOUS
  Filled 2022-11-06 (×3): qty 6

## 2022-11-06 MED ORDER — ACETAMINOPHEN 325 MG PO TABS
650.0000 mg | ORAL_TABLET | ORAL | Status: DC | PRN
  Administered 2022-11-07 – 2022-11-10 (×2): 650 mg via ORAL

## 2022-11-06 MED ORDER — MIDAZOLAM HCL 2 MG/2ML IJ SOLN
INTRAMUSCULAR | Status: DC | PRN
  Administered 2022-11-06 (×2): 1 mg via INTRAVENOUS

## 2022-11-06 MED ORDER — HEPARIN SODIUM (PORCINE) 1000 UNIT/ML IJ SOLN
INTRAMUSCULAR | Status: DC | PRN
  Administered 2022-11-06: 5500 [IU] via INTRAVENOUS

## 2022-11-06 MED ORDER — LIDOCAINE HCL (PF) 1 % IJ SOLN
INTRAMUSCULAR | Status: AC
  Filled 2022-11-06: qty 30

## 2022-11-06 MED ORDER — SPIRONOLACTONE 25 MG PO TABS
25.0000 mg | ORAL_TABLET | Freq: Every day | ORAL | Status: DC
  Administered 2022-11-06 – 2022-11-10 (×5): 25 mg via ORAL
  Filled 2022-11-06 (×6): qty 1

## 2022-11-06 MED ORDER — FENTANYL CITRATE (PF) 100 MCG/2ML IJ SOLN
INTRAMUSCULAR | Status: AC
  Filled 2022-11-06: qty 2

## 2022-11-06 MED ORDER — HYDRALAZINE HCL 20 MG/ML IJ SOLN: 10.0000 mg | INTRAMUSCULAR | Status: AC | PRN

## 2022-11-06 MED ORDER — FENTANYL CITRATE (PF) 100 MCG/2ML IJ SOLN
INTRAMUSCULAR | Status: DC | PRN
  Administered 2022-11-06 (×2): 25 ug via INTRAVENOUS

## 2022-11-06 MED ORDER — SODIUM CHLORIDE 0.9 % IV SOLN: 250.0000 mL | INTRAVENOUS | Status: DC | PRN

## 2022-11-06 MED ORDER — SODIUM CHLORIDE 0.9% FLUSH
3.0000 mL | Freq: Two times a day (BID) | INTRAVENOUS | Status: DC
  Administered 2022-11-06 – 2022-11-09 (×5): 3 mL via INTRAVENOUS

## 2022-11-06 MED ORDER — DAPAGLIFLOZIN PROPANEDIOL 10 MG PO TABS
10.0000 mg | ORAL_TABLET | Freq: Every day | ORAL | Status: DC
  Administered 2022-11-06 – 2022-11-10 (×5): 10 mg via ORAL
  Filled 2022-11-06 (×5): qty 1

## 2022-11-06 MED ORDER — KETOROLAC TROMETHAMINE 15 MG/ML IJ SOLN
15.0000 mg | Freq: Once | INTRAMUSCULAR | Status: AC
  Administered 2022-11-06: 15 mg via INTRAVENOUS
  Filled 2022-11-06: qty 1

## 2022-11-06 SURGICAL SUPPLY — 13 items
CATH 5FR JL3.5 JR4 ANG PIG MP (CATHETERS) IMPLANT
CATH BALLN WEDGE 5F 110CM (CATHETERS) IMPLANT
CATH INFINITI 5 FR 3DRC (CATHETERS) IMPLANT
DEVICE RAD TR BAND REGULAR (VASCULAR PRODUCTS) IMPLANT
GLIDESHEATH SLEND SS 6F .021 (SHEATH) IMPLANT
GUIDEWIRE INQWIRE 1.5J.035X260 (WIRE) IMPLANT
INQWIRE 1.5J .035X260CM (WIRE) ×1
KIT HEART LEFT (KITS) ×1 IMPLANT
PACK CARDIAC CATHETERIZATION (CUSTOM PROCEDURE TRAY) ×1 IMPLANT
SHEATH GLIDE SLENDER 4/5FR (SHEATH) IMPLANT
SHEATH PROBE COVER 6X72 (BAG) IMPLANT
TRANSDUCER W/STOPCOCK (MISCELLANEOUS) ×1 IMPLANT
WIRE HI TORQ VERSACORE-J 145CM (WIRE) IMPLANT

## 2022-11-06 NOTE — Progress Notes (Addendum)
yeah yeah PROGRESS NOTE    Karen Lutz  [id number] DOB: September 26, 1970 DOA: 11/01/2022 PCP: Jola Baptist, PA-C  51/F with history of obesity, bariatric surgery, history of EtOH and prior cocaine use, GERD was brought to the ICU after being found unresponsive by boyfriend, EMS was called she was in V-fib, had defibrillation followed by PEA arrest and V. tach, defibrillation again, Rx w/ Amio, started epi gtt., Narcan, had 23 minutes of CPR before ROSC, intubated in the ED, labs noted potassium of 2.9 UDS positive for benzos.  Admitted to the ICU -Weaned off pressors, 2D echo noted cardiomyopathy, EF 25%, heart failure team following. -Transferred from PCCM to [facility] service today 4/2 -4/2 right and left heart cath, normal coronaries, elevated filling pressures, normal cardiac output  Subjective: -Feels fair, no events overnight  Assessment and Plan:  Out of hospital V-fib arrest  -Achieved ROSC after 23 minutes of CPR -Completed TTM -Hypokalemic initially, with prolonged QTc, now improved -UDS pos for benzos only -Cards following, plan for EP eval for ICD pending cardiac MRI  Acute systolic CHF -New diagnosis, EF 25% -Etiology is unclear, LHC with normal coronaries today, plan for cardiac MRI, familial cardiomyopathy also considered with multiple family members w/ CHF -Elevated filling pressures on RHC, started on IV Lasix, continue Aldactone, Farxiga  Mild encephalopathy -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 -EEG this admission noted diffuse encephalopathy without epileptiform discharges -Continue thiamine, B12 normal, also check TSH  DVT prophylaxis: Lovenox Code Status: Full code Family Communication: None present Disposition Plan: Transfer out of ICU today  Consultants: PCCM   Antimicrobials:    Objective: Vitals:   11/06/22 0902 11/06/22 0907 11/06/22 0912 11/06/22 1128  BP: (!) 153/84 (!) 152/87 (!) 168/90   Pulse: 96 96 (!) 0   Resp: 20 19    Temp:    98.9 F  (37.2 C)  TempSrc:    Oral  SpO2: 96% 97% 99%   Weight:      Height:        Intake/Output Summary (Last 24 hours) at 11/06/2022 1154 Last data filed at 11/06/2022 1100 Gross per 24 hour  Intake 658.42 ml  Output 875 ml  Net -216.58 ml   Filed Weights   11/05/22 0446 11/05/22 2034 11/06/22 0346  Weight: 116.9 kg 113.4 kg 113.4 kg    Examination:  General exam: Obese chronically ill female sitting up in bed, awake alert oriented x 3, mild cognitive deficits CVS: S1-S2, regular rhythm Lungs: Decreased breath sounds at the bases otherwise clear Abdomen: Soft, obese, nontender, bowel sounds present Extremities: No edema, right radial TR band noted  Skin: No rashes Psychiatry: Flat affect    Data Reviewed:   CBC: Recent Labs  Lab 11/01/22 0250 11/01/22 0254 11/02/22 0350 11/02/22 1530 11/03/22 0400 11/04/22 0410 11/05/22 0430 11/06/22 0209  WBC 14.0*  --  13.0*  --  7.8 5.7 4.7 5.0  NEUTROABS 3.8  --   --   --   --   --   --   --   HGB 12.1   < > 12.3 12.6 11.6* 11.4* 11.0* 11.1*  HCT 38.9   < > 38.2 37.0 36.7 34.3* 35.1* 36.2  MCV 88.8  --  85.5  --  85.2 83.1 86.5 87.7  PLT 232  --  172  --  144* 135* 135* 142*   < > = values in this interval not displayed.   Basic Metabolic Panel: Recent Labs  Lab 11/02/22 0350 11/02/22 1150 11/02/22  1530 11/03/22 0400 11/04/22 0410 11/05/22 0430 11/06/22 0209  NA 137  --  138 137 140 140 139  K 4.5  --  3.9 3.4* 3.8 3.5 3.6  CL 106  --   --  107 108 105 106  CO2 23  --   --  22 24 25 24   GLUCOSE 170*  --   --  161* 116* 105* 86  BUN 15  --   --  18 20 17 12   CREATININE 1.29*  --   --  1.33* 1.20* 1.02* 0.98  CALCIUM 9.0  --   --  9.2 9.0 9.3 9.4  MG 1.9  --   --  2.1 1.8 2.4 2.1  PHOS 3.3 3.2  --  3.4 3.4 3.8 3.8   GFR: Estimated Creatinine Clearance: 87.7 mL/min (by C-G formula based on SCr of 0.98 mg/dL). Liver Function Tests: Recent Labs  Lab 11/01/22 0250 11/02/22 0350 11/03/22 0400 11/04/22 0410  11/05/22 0430  AST 209* 50* 23 16 14*  ALT 60* 44 32 23 17  ALKPHOS 83 83 83 79 74  BILITOT 0.5 0.7 0.5 0.6 0.5  PROT 5.2* 5.3* 5.3* 5.6* 5.8*  ALBUMIN 2.7* 2.5* 2.4* 2.5* 2.5*   No results for input(s): "LIPASE", "AMYLASE" in the last 168 hours. No results for input(s): "AMMONIA" in the last 168 hours. Coagulation Profile: No results for input(s): "INR", "PROTIME" in the last 168 hours. Cardiac Enzymes: No results for input(s): "CKTOTAL", "CKMB", "CKMBINDEX", "TROPONINI" in the last 168 hours. BNP (last 3 results) No results for input(s): "PROBNP" in the last 8760 hours. HbA1C: No results for input(s): "HGBA1C" in the last 72 hours. CBG: Recent Labs  Lab 11/05/22 2020 11/05/22 2055 11/05/22 2344 11/06/22 0420 11/06/22 1125  GLUCAP 66* 87 81 82 69*   Lipid Profile: No results for input(s): "CHOL", "HDL", "LDLCALC", "TRIG", "CHOLHDL", "LDLDIRECT" in the last 72 hours. Thyroid Function Tests: No results for input(s): "TSH", "T4TOTAL", "FREET4", "T3FREE", "THYROIDAB" in the last 72 hours. Anemia Panel: No results for input(s): "VITAMINB12", "FOLATE", "FERRITIN", "TIBC", "IRON", "RETICCTPCT" in the last 72 hours. Urine analysis:    Component Value Date/Time   COLORURINE YELLOW 11/01/2022 0419   APPEARANCEUR HAZY (A) 11/01/2022 0419   LABSPEC 1.007 11/01/2022 0419   PHURINE 6.0 11/01/2022 0419   GLUCOSEU 50 (A) 11/01/2022 0419   HGBUR MODERATE (A) 11/01/2022 0419   BILIRUBINUR NEGATIVE 11/01/2022 0419   KETONESUR NEGATIVE 11/01/2022 0419   PROTEINUR >=300 (A) 11/01/2022 0419   UROBILINOGEN 0.2 09/07/2019 1631   NITRITE NEGATIVE 11/01/2022 0419   LEUKOCYTESUR NEGATIVE 11/01/2022 0419   Sepsis Labs: @LABRCNTIP (procalcitonin:4,lacticidven:4)  ) Recent Results (from the past 240 hour(s))  MRSA Next Gen by PCR, Nasal     Status: None   Collection Time: 11/01/22  5:37 AM   Specimen: Nasal Mucosa; Nasal Swab  Result Value Ref Range Status   MRSA by PCR Next Gen NOT  DETECTED NOT DETECTED Final    Comment: (NOTE) The GeneXpert MRSA Assay (FDA approved for NASAL specimens only), is one component of a comprehensive MRSA colonization surveillance program. It is not intended to diagnose MRSA infection nor to guide or monitor treatment for MRSA infections. Test performance is not FDA approved in patients less than 94 years old. Performed at [facility], [place] [address] [place], [place] [address]   Culture, Respiratory w Gram Stain     Status: None   Collection Time: 11/01/22  5:52 AM   Specimen: Endotracheal; Respiratory  Result Value Ref Range Status   Specimen Description ENDOTRACHEAL  Final   Special Requests NONE  Final   Gram Stain   Final    MODERATE WBC PRESENT, PREDOMINANTLY PMN RARE GRAM POSITIVE COCCI IN PAIRS    Culture   Final    RARE Normal respiratory flora-no Staph aureus or Pseudomonas seen Performed at [place] [address] [place], [place] [address]    Report Status 11/03/2022 FINAL  Final  Culture, blood (Routine X 2) w Reflex to ID Panel     Status: None   Collection Time: 11/01/22 11:26 AM   Specimen: BLOOD LEFT WRIST  Result Value Ref Range Status   Specimen Description BLOOD LEFT WRIST  Final   Special Requests   Final    BOTTLES DRAWN AEROBIC ONLY Blood Culture adequate volume   Culture   Final    NO GROWTH 5 DAYS Performed at [facility], [address], [place], [place] [address]    Report Status 11/06/2022 FINAL  Final  Culture, blood (Routine X 2) w Reflex to ID Panel     Status: None   Collection Time: 11/01/22 12:57 PM   Specimen: BLOOD  Result Value Ref Range Status   Specimen Description BLOOD FOOT  Final   Special Requests   Final    BOTTLES DRAWN AEROBIC AND ANAEROBIC Blood Culture results may not be optimal due to an inadequate volume of blood received in culture bottles   Culture   Final    NO GROWTH 5 DAYS Performed at [facility], [place] [address], [place], [place]  [address]    Report Status 11/06/2022 FINAL  Final     Radiology Studies: CARDIAC CATHETERIZATION  Result Date: 11/06/2022 1. Normal coronaries. 2. Elevated filling pressures. 3. Mild pulmonary venous hypertension. 4. Preserved cardiac output.   DG CHEST PORT 1 VIEW  Result Date: 11/05/2022 CLINICAL DATA:  NG tube placement EXAM: PORTABLE CHEST 1 VIEW COMPARISON:  CXR 11/03/22 FINDINGS: Right arm PICC with tip near the cavoatrial junction/upper right atrium. Endotracheal tube terminates approximately 1.5 cm above the carina. Enteric tube courses below diaphragm with the side hole and tip out of the field of view. No pleural effusion. No pneumothorax. Cardiomegaly. Compared to prior exam there is increased bilateral perihilar opacities, which could be seen in the setting of pulmonary edema or atypical infection. Visualized upper abdomen is unremarkable. No radiographically apparent displaced rib fractures. IMPRESSION: Increased bilateral perihilar opacities, which could be seen in the setting of pulmonary edema. Cardiomegaly. Electronically Signed   By: Marin Roberts M.D.   On: 11/05/2022 07:26   MR CERVICAL SPINE WO CONTRAST  Result Date: 11/04/2022 CLINICAL DATA:  Myelopathy, chronic, cervical spine. EXAM: LIMITED MRI CERVICAL SPINE WITHOUT CONTRAST TECHNIQUE: Sagittal multisequence MR imaging of the cervical spine was performed. Patient was unable to complete the examination. No axial imaging obtained. No intravenous contrast was administered. COMPARISON:  Cervical spine CT 06/24/2020. Cervical spine radiographs 07/26/2022. FINDINGS: Despite efforts by the technologist and patient, moderate motion artifact is present on today's exam and could not be eliminated. This reduces exam sensitivity and specificity. As above, patient unable to complete examination. No axial images obtained. Alignment: Normal. Vertebrae: No evidence of acute fracture or traumatic subluxation. No suspicious focal osseous lesions.  Cord: Normal in signal and caliber. No evidence for myelopathy on sagittal imaging. The spinal canal appears widely patent. Posterior Fossa, vertebral arteries, paraspinal tissues: The posterior fossa appears unremarkable as  imaged in the sagittal plane. No paraspinal abnormalities identified on limited sagittal imaging. Disc levels: Sagittal images demonstrate mild spondylosis at C4-5 with asymmetric uncinate spurring on the right. This does not appear to cause more than mild right-sided foraminal narrowing based on the sagittal images. No large disc herniation, cord deformity or high-grade foraminal narrowing identified. IMPRESSION: 1. Limited examination. The patient was unable to complete the examination due to claustrophobia. No axial images obtained. 2. No evidence of cord deformity, myelopathy or significant spinal stenosis. 3. Mild spondylosis at C4-5 with asymmetric uncinate spurring on the right contributing to mild right-sided foraminal narrowing. No large disc herniation, cord deformity or high-grade foraminal narrowing identified. Electronically Signed   By: Richardean Sale M.D.   On: 11/04/2022 16:47     Scheduled Meds:  [START ON 11/07/2022] aspirin  81 mg Per Tube Daily   Chlorhexidine Gluconate Cloth  6 each Topical Q0600   dapagliflozin propanediol  10 mg Oral Daily   docusate  100 mg Per Tube BID   [START ON 11/07/2022] enoxaparin (LOVENOX) injection  40 mg Subcutaneous [id number]   folic acid  1 mg Oral Daily   furosemide  60 mg Intravenous BID   insulin aspart  0-15 Units Subcutaneous Q4H   losartan  12.5 mg Oral Daily   metoCLOPramide (REGLAN) injection  5 mg Intravenous Q6H   polyethylene glycol  17 g Per Tube Daily   potassium chloride  40 mEq Oral Once   potassium chloride  40 mEq Oral Once   potassium chloride  40 mEq Oral BID   sodium chloride flush  10-40 mL Intracatheter Q12H   sodium chloride flush  3 mL Intravenous Q12H   sodium chloride flush  3 mL Intravenous Q12H    spironolactone  25 mg Oral Daily   thiamine  100 mg Oral Daily   Continuous Infusions:  sodium chloride       LOS: 5 days    Time spent: 43min    Domenic Polite, MD [facility]   11/06/2022, 11:54 AM

## 2022-11-06 NOTE — Interval H&P Note (Signed)
History and Physical Interval Note:  11/06/2022 8:22 AM  Karen Lutz  has presented today for surgery, with the diagnosis of heart failure - vf arrest.  The various methods of treatment have been discussed with the patient and family. After consideration of risks, benefits and other options for treatment, the patient has consented to  Procedure(s): RIGHT/LEFT HEART CATH AND CORONARY ANGIOGRAPHY (N/A) as a surgical intervention.  The patient's history has been reviewed, patient examined, no change in status, stable for surgery.  I have reviewed the patient's chart and labs.  Questions were answered to the patient's satisfaction.     Standley Bargo [employer]

## 2022-11-06 NOTE — Progress Notes (Signed)
Patient ID: Karen Lutz, female   DOB: June 21, 1971, 52 y.o.   MRN: [id number]    Advanced Heart Failure Team Consult Note   Primary Physician: Jola Baptist, PA-C PCP-Cardiologist:  None  Reason for Consultation: Acute Systolic Heart Failure, Post OOH VF Arrest   HPI:    [name] w/ hx of obesity s/p bariatric surgery and significant family history of early CAD and early onset CHF currently admitted to [facility] after cardiac arrest at home. As per chart review and family at bedside, patient was found unresponsive by her boyfriend at home requiring at least 23 minutes of CPR before ROSC; apparently in VF arrest. In the ER she was found to be hypokalemic with a potassium of 2.9 with UDS + for benzos.  Initially intubated, now extubated.   No dyspnea at rest.  Still confused but can be reoriented.    RHC/LHC done today as below: Coronary Findings  Diagnostic Dominance: Right Left Main  Vessel was injected. Vessel is normal in caliber. Vessel is angiographically normal.    Left Anterior Descending  Vessel was injected. Vessel is normal in caliber. Vessel is angiographically normal.    Left Circumflex  Vessel was injected. Vessel is normal in caliber. Vessel is angiographically normal.    Right Coronary Artery  Vessel was injected. Vessel is normal in caliber. Vessel is angiographically normal.    Intervention   No interventions have been documented.   Right Heart  Right Heart Pressures RHC Procedural Findings: Hemodynamics (mmHg) RA mean 8 RV 46/10 PA 47/28, mean 36 PCWP mean 26 LV 130/28 AO 132/78  Oxygen saturations: PA 66% AO 96%  Cardiac Output (Fick) 6.45  Cardiac Index (Fick) 2.94 PVR 1.55 WU    Objective:    Vital Signs:   Temp:  [98.8 F (37.1 C)-99.3 F (37.4 C)] 99 F (37.2 C) (04/02 0400) Pulse Rate:  [0-114] 0 (04/02 0912) Resp:  [14-26] 19 (04/02 0907) BP: (91-168)/(47-98) 168/90 (04/02 0912) SpO2:  [88 %-99 %] 99 % (04/02 0912) Weight:   [113.4 kg] 113.4 kg (04/02 0346) Last BM Date : 11/05/22  Weight change: Filed Weights   11/05/22 0446 11/05/22 2034 11/06/22 0346  Weight: 116.9 kg 113.4 kg 113.4 kg    Intake/Output:   Intake/Output Summary (Last 24 hours) at 11/06/2022 [id number] Last data filed at 11/06/2022 0600 Gross per 24 hour  Intake 658.42 ml  Output 500 ml  Net 158.42 ml      Physical Exam    General: NAD Neck: JVP 10 cm, no thyromegaly or thyroid nodule.  Lungs: Clear to auscultation bilaterally with normal respiratory effort. CV: Nondisplaced PMI.  Heart regular S1/S2, no S3/S4, no murmur.  No peripheral edema.   Abdomen: Soft, nontender, no hepatosplenomegaly, no distention.  Skin: Intact without lesions or rashes.  Neurologic: Alert but confused.  Psych: Normal affect. Extremities: No clubbing or cyanosis.  HEENT: Normal.    Telemetry   NSR 70s; no arrhythmias.   EKG   No new EKG to review   Labs   Basic Metabolic Panel: Recent Labs  Lab 11/02/22 0350 11/02/22 1150 11/02/22 1530 11/03/22 0400 11/04/22 0410 11/05/22 0430 11/06/22 0209  NA 137  --  138 137 140 140 139  K 4.5  --  3.9 3.4* 3.8 3.5 3.6  CL 106  --   --  107 108 105 106  CO2 23  --   --  22 24 25 24   GLUCOSE 170*  --   --  161* 116* 105* 86  BUN 15  --   --  18 20 17 12   CREATININE 1.29*  --   --  1.33* 1.20* 1.02* 0.98  CALCIUM 9.0  --   --  9.2 9.0 9.3 9.4  MG 1.9  --   --  2.1 1.8 2.4 2.1  PHOS 3.3 3.2  --  3.4 3.4 3.8 3.8    Liver Function Tests: Recent Labs  Lab 11/01/22 0250 11/02/22 0350 11/03/22 0400 11/04/22 0410 11/05/22 0430  AST 209* 50* 23 16 14*  ALT 60* 44 32 23 17  ALKPHOS 83 83 83 79 74  BILITOT 0.5 0.7 0.5 0.6 0.5  PROT 5.2* 5.3* 5.3* 5.6* 5.8*  ALBUMIN 2.7* 2.5* 2.4* 2.5* 2.5*   No results for input(s): "LIPASE", "AMYLASE" in the last 168 hours. No results for input(s): "AMMONIA" in the last 168 hours.  CBC: Recent Labs  Lab 11/01/22 0250 11/01/22 0254 11/02/22 0350  11/02/22 1530 11/03/22 0400 11/04/22 0410 11/05/22 0430 11/06/22 0209  WBC 14.0*  --  13.0*  --  7.8 5.7 4.7 5.0  NEUTROABS 3.8  --   --   --   --   --   --   --   HGB 12.1   < > 12.3 12.6 11.6* 11.4* 11.0* 11.1*  HCT 38.9   < > 38.2 37.0 36.7 34.3* 35.1* 36.2  MCV 88.8  --  85.5  --  85.2 83.1 86.5 87.7  PLT 232  --  172  --  144* 135* 135* 142*   < > = values in this interval not displayed.    CBG: Recent Labs  Lab 11/05/22 1658 11/05/22 2020 11/05/22 2055 11/05/22 2344 11/06/22 0420  GLUCAP 90 66* 87 81 82     Imaging   CARDIAC CATHETERIZATION  Result Date: 11/06/2022 1. Normal coronaries. 2. Elevated filling pressures. 3. Mild pulmonary venous hypertension. 4. Preserved cardiac output.     Medications:     Current Medications:  [MAR Hold] aspirin  81 mg Per Tube Daily   [MAR Hold] Chlorhexidine Gluconate Cloth  6 each Topical Q0600   dapagliflozin propanediol  10 mg Oral Daily   [MAR Hold] docusate  100 mg Per Tube BID   [MAR Hold] folic acid  1 mg Oral Daily   furosemide  60 mg Intravenous BID   [MAR Hold] insulin aspart  0-15 Units Subcutaneous Q4H   [MAR Hold] losartan  12.5 mg Oral Daily   [MAR Hold] metoCLOPramide (REGLAN) injection  5 mg Intravenous Q6H   [MAR Hold] polyethylene glycol  17 g Per Tube Daily   [MAR Hold] potassium chloride  40 mEq Oral Once   [MAR Hold] potassium chloride  40 mEq Oral Once   potassium chloride  40 mEq Oral BID   [MAR Hold] sodium chloride flush  10-40 mL Intracatheter Q12H   [MAR Hold] sodium chloride flush  3 mL Intravenous Q12H   spironolactone  25 mg Oral Daily   [MAR Hold] thiamine  100 mg Oral Daily    Infusions:  sodium chloride     sodium chloride 10 mL/hr at 11/06/22 0600      Patient Profile   52 y/o [name] w/ h/o obesity s/p bariatric surgery 11/23 at [facility], reported FH of premature CAD (father MI in 84s) and nd FH of CHF (2 sisters). Had Influenza A 12/23. Admitted w/ OOH VF arrest.   1. VF arrest: Out  of hospital. She was hypokalemic initially with  QTc 511 msec.  ECG yesterday with normal QTc.  Think she will need secondary prevention ICD.  - Will need eventual EP evaluation, will get cMRI first.  2. Acute systolic CHF: Echo with EF 25%, diffuse hypokinesis, normal RV.  Cause uncertain.  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  No CAD on cath.  Filling pressures remain elevated on cath with preserved cardiac output.  - Increase spironolactone to 25 mg daily . - Continue losartan 12.5 daily. - Add Farxiga 10 mg daily.  - Will arrange for cardiac MRI.  3. Neuro: Still with some confusion post-arrest.  She can be re-oriented.    [employer]  Loralie Champagne 11/06/2022 9:22 AM

## 2022-11-06 NOTE — Progress Notes (Signed)
Transported to cath lab on monitor.

## 2022-11-06 NOTE — Progress Notes (Signed)
PT Cancellation Note  Patient Details Name: Karen Lutz MRN: [id number] DOB: August 09, 1970   Cancelled Treatment:    Reason Eval/Treat Not Completed: Patient at procedure or test/unavailable (cardiac cath)   [name] [name] 11/06/2022, 8:01 AM [place] Office: [phone]

## 2022-11-07 ENCOUNTER — Other Ambulatory Visit (HOSPITAL_COMMUNITY): Payer: Self-pay

## 2022-11-07 ENCOUNTER — Inpatient Hospital Stay (HOSPITAL_COMMUNITY): Payer: BLUE CROSS/BLUE SHIELD

## 2022-11-07 DIAGNOSIS — E876 Hypokalemia: Secondary | ICD-10-CM

## 2022-11-07 DIAGNOSIS — E669 Obesity, unspecified: Secondary | ICD-10-CM

## 2022-11-07 DIAGNOSIS — I469 Cardiac arrest, cause unspecified: Secondary | ICD-10-CM | POA: Diagnosis not present

## 2022-11-07 DIAGNOSIS — G9341 Metabolic encephalopathy: Secondary | ICD-10-CM | POA: Diagnosis not present

## 2022-11-07 DIAGNOSIS — J9601 Acute respiratory failure with hypoxia: Secondary | ICD-10-CM | POA: Diagnosis not present

## 2022-11-07 LAB — BASIC METABOLIC PANEL
Anion gap: 8 (ref 5–15)
BUN: 15 mg/dL (ref 6–20)
CO2: 29 mmol/L (ref 22–32)
Calcium: 10 mg/dL (ref 8.9–10.3)
Chloride: 102 mmol/L (ref 98–111)
Creatinine, Ser: 1.2 mg/dL — ABNORMAL HIGH (ref 0.44–1.00)
GFR, Estimated: 55 mL/min — ABNORMAL LOW (ref >60)
Glucose, Bld: 92 mg/dL (ref 70–99)
Potassium: 4.1 mmol/L (ref 3.5–5.1)
Sodium: 139 mmol/L (ref 135–145)

## 2022-11-07 LAB — GLUCOSE, CAPILLARY
Glucose-Capillary: 78 mg/dL (ref 70–99)
Glucose-Capillary: 83 mg/dL (ref 70–99)
Glucose-Capillary: 93 mg/dL (ref 70–99)
Glucose-Capillary: 90 mg/dL (ref 70–99)
Glucose-Capillary: 104 mg/dL — ABNORMAL HIGH (ref 70–99)

## 2022-11-07 MED ORDER — GADOBUTROL 1 MMOL/ML IV SOLN
10.0000 mL | Freq: Once | INTRAVENOUS | Status: AC | PRN
  Administered 2022-11-07: 10 mL via INTRAVENOUS

## 2022-11-07 MED ORDER — SACUBITRIL-VALSARTAN 24-26 MG PO TABS
1.0000 | ORAL_TABLET | Freq: Two times a day (BID) | ORAL | Status: DC
  Administered 2022-11-08: 1 via ORAL
  Filled 2022-11-07: qty 1

## 2022-11-07 NOTE — Hospital Course (Addendum)
Karen Lutz was admitted to the hospital after a cardiac arrest, presumed to be due to hypokalemia, qt prolongation.   52 yo female with the past medical history of obesity sp bariatric surgery who was found down unresponsive. EMS was called and she was in ventricular fibrillation. Patient had prompt defibrillation, converting to PEA and VT. Recurrent VF, underwent second shock and converting to SVT. Patient was placed on amiodarone and epinephrine infusions. After 23 minutes of CPR she recovered spontaneous circulation. She was admitted to the intensive care unit on invasive mechanical ventilation. Blood pressure on admission to the ICU 102/70, HR 95, RR 26 and 02 saturation 100%. Lungs with coarse breath sounds, heart with S1 and S2 present and rhythmic, abdomen not distended and no lower extremity edema.   Patient was placed non norepinephrine infusion for shock.  Echocardiogram with reduced LV systolic function and diffuse wall motion abnormalities.  Antibiotic therapy for possible aspiration pneumonia.   03/31 liberated from mechanical ventilation.  Off vasopressors.  04/10 Placed ARB and spironolactone.  04/02 Transferred to [facility]. Right and left cardiac catheterization with normal coronaries, and elevated filling pressures. Preserved cardiac output.  04/03 Cardiac MRI, consulted EP for secondary prevention ICD.  04/05 ICD implantation today.

## 2022-11-07 NOTE — Progress Notes (Signed)
Pt received HF book and was educated on LE edema and other concerning s/s, recording daily weights and when to call provider, sodium reduction (printout provided), importance of taking meds and physical activity, and fluid restriction. Will refer to [place] 11/07/2022 2:46 PM

## 2022-11-07 NOTE — Assessment & Plan Note (Signed)
Calculated BMI is 38.6

## 2022-11-07 NOTE — TOC Benefit Eligibility Note (Addendum)
Patient Teacher, English as a foreign language completed.    The patient is currently admitted and upon discharge could be taking Farxiga 10 mg.  The current 30 day co-pay is $477.18 due to a $450.00 deductible.  Will be $40.00 once deductible is met.   The patient is currently admitted and upon discharge could be taking Entresto 24-26 mg.  The current 30 day co-pay is $477.18 due to a $450.00 deductible.  Will be $40.00 once deductible is met.   The patient is insured through [employer]   This test claim was processed through [place] amounts may vary at other pharmacies due to [employer], or as the patient moves through the different stages of their insurance plan.  Lyndel Safe, [place] Patient Advocate Specialist [place] Patient Advocate Team Direct Number: [phone]  Fax: [phone]

## 2022-11-07 NOTE — Progress Notes (Addendum)
Physical Therapy Treatment Patient Details Name: Karen Lutz MRN: [id number] DOB: 01/25/1971 Today's Date: 11/07/2022   History of Present Illness [name] admitted 3/28. Pt found unresponsive at home in cardiac arrest requiring at least 72minutes of CPR before ROSC. Pt hypokalemic UDS + for benzos. VDRF 3/28-3/30. PMH: obesity s/p gastric bypass sx    PT Comments    Pt received in long sit in bed, agreeable to therapy session and with good participation and tolerance for transfer, gait and stair training. Pt with decreased insight into deficits/safety and unable to recall post-cath precs, reminded her not to push/pull heavily for 5 days with affected extremity, pt would benefit from handout to reinforce. Pt needing frequent minA during gait with RW and stair training due to R LOB and will need constant supervision/assist at home for safety given balance deficits and decreased insight. Pt A&O x4 today but reports poor memory of early hospital admission and post-cath precs. Recommend max HH services upon DC. Pt would benefit from OT consult to continue working on cognition and balance/iADL tasks. Pt continues to benefit from PT services to progress toward functional mobility goals.    Recommendations for follow up therapy are one component of a multi-disciplinary discharge planning process, led by the attending physician.  Recommendations may be updated based on patient status, additional functional criteria and insurance authorization.  Follow Up Recommendations       Assistance Recommended at Discharge Frequent or constant Supervision/Assistance  Patient can return home with the following A little help with walking and/or transfers;A little help with bathing/dressing/bathroom;Assistance with cooking/housework;Assist for transportation;Help with stairs or ramp for entrance   Equipment Recommendations  Rolling walker (2 wheels);BSC/3in1    Recommendations for Other Services        Precautions / Restrictions Precautions Precautions: Fall Precaution Comments: s/p RHC, reviewed RUE precs, pt was unaware Restrictions Weight Bearing Restrictions: No Other Position/Activity Restrictions:  (post RHC RUE no pushing/pulling >5 lbs for 5 days)     Mobility  Bed Mobility Overal bed mobility: Needs Assistance Bed Mobility: Supine to Sit     Supine to sit: Supervision     General bed mobility comments: pt long sitting in bed, no assist needed to scoot to EOB, cues not to push heavily with RUE    Transfers Overall transfer level: Needs assistance Equipment used: Rolling walker (2 wheels), None Transfers: Sit to/from Stand Sit to Stand: Min guard, Min assist           General transfer comment: cues for hand placement to rise from [facility] and to chair, no heavy pulling/pushing with RUE, pt with decreased eccentric control to sit needing minA    Ambulation/Gait Ambulation/Gait assistance: Min guard, Min assist Gait Distance (Feet): 150 Feet Assistive device: Rolling walker (2 wheels) Gait Pattern/deviations: Step-through pattern, Decreased stride length, Trunk flexed, Drifts right/left, Narrow base of support, Scissoring       General Gait Details: pt with tendency to drift right with gait with cues for posture, proximity to RW and direction of RW. Frequent brief pauses while lifting LLE and these pauses tend to lead her off balance to her R side, needing min guard to minA to correct. Pt states she just feels "off balance" and denies other sx when this happens. HR tachy to 128 bpm with exertion. SpO2 99% on RA. BP stable with sit to stand.   Stairs Stairs: Yes Stairs assistance: Min assist Stair Management: One rail Right, One rail Left, Step to pattern, Forwards  Number of Stairs: 3 General stair comments: single 7" step x3 reps to simulate stairs at home, mildly unsteady with minA to steady. No significant fatigue afterward   Wheelchair Mobility     Modified Rankin (Stroke Patients Only)       Balance Overall balance assessment: Needs assistance Sitting-balance support: No upper extremity supported Sitting balance-Leahy Scale: Good Sitting balance - Comments: long sitting in bed without difficulty   Standing balance support: Bilateral upper extremity supported, Reliant on assistive device for balance Standing balance-Leahy Scale: Poor Standing balance comment: needs single UE for static standing and BUE for dynamic standing (RW vs furniture) >3 LOB during gait trial                            Cognition Arousal/Alertness: Awake/alert Behavior During Therapy: Flat affect Overall Cognitive Status: Impaired/Different from baseline Area of Impairment: Memory, Safety/judgement, Problem solving                     Memory: Decreased short-term memory, Decreased recall of precautions Following Commands: Follows one step commands with increased time Safety/Judgement: Decreased awareness of safety, Decreased awareness of deficits   Problem Solving: Slow processing, Requires verbal cues General Comments: Pt working 2 jobs PTA.  Pt needing incr commands today and decreased memory and recall of safety, pt with frequent pauses while stepping (only for a moment) with LLE.        Exercises      General Comments General comments (skin integrity, edema, etc.): VSS on RA, see gait section      Pertinent Vitals/Pain Pain Assessment Pain Assessment: No/denies pain Pain Intervention(s): Monitored during session, Repositioned           PT Goals (current goals can now be found in the care plan section) Acute Rehab PT Goals Patient Stated Goal: to go home PT Goal Formulation: With patient Time For Goal Achievement: 11/18/22 Progress towards PT goals: Progressing toward goals    Frequency    Min 1X/week      PT Plan Current plan remains appropriate       AM-PAC PT "6 Clicks" Mobility   Outcome  Measure  Help needed turning from your back to your side while in a flat bed without using bedrails?: None Help needed moving from lying on your back to sitting on the side of a flat bed without using bedrails?: A Little Help needed moving to and from a bed to a chair (including a wheelchair)?: A Little Help needed standing up from a chair using your arms (e.g., wheelchair or bedside chair)?: A Little Help needed to walk in hospital room?: A Little Help needed climbing 3-5 steps with a railing? : A Little 6 Click Score: 19    End of Session Equipment Utilized During Treatment: Gait belt Activity Tolerance: Patient tolerated treatment well Patient left: in bed;with call bell/phone within reach;with bed alarm set;Other (comment) (pt requesting to sit EOB, taking phone call) Nurse Communication: Mobility status;Other (comment) (bed hydraulics broken, [place] notified and ordered another bed, her current bed is tagged to be removed/changed out) PT Visit Diagnosis: Other abnormalities of gait and mobility (R26.89);Muscle weakness (generalized) (M62.81)     Time: [id number] PT Time Calculation (min) (ACUTE ONLY): 26 min  Charges:  $Gait Training: 8-22 mins $Therapeutic Activity: 8-22 mins                     Axl Rodino P., PTA  Acute Rehabilitation Services Secure Chat Preferred 9a-5:30pm Office: [place] 11/07/2022, 7:05 PM

## 2022-11-07 NOTE — Assessment & Plan Note (Addendum)
Stable renal functio with serum cr at 1,12 with K at 4,2 and serum bicarbonate at 28. Plan to continue diuresis.

## 2022-11-07 NOTE — Assessment & Plan Note (Addendum)
Cardiogenic shock Ventricular fibrillation.  Acute on chronic systolic heart failure.   Echocardiogram with reduced LV systolic function with EF 25%, global hypokinesis, severe dilatated LV cavity, no LVH, RV systolic function preserved. RVSP 34,6. LA with moderate dilatation. No pericardial effusion. No significant valvular disease.   04/02 cardiac catheterization PA 47/28 mean 36 PCWP mean 26 PVR 1.5  Cardiac output 6.4 and index 2.9 per Fick.   Post capillary pulmonary hypertension.  Coronary angiography with normal coronaries.   Plan to continue diuresis with furosemide, spironolactone and dapagliflozin.  Entresto, holding on B blockade  Plan for ICD implantation for secondary prophylaxis V fib and V tach.

## 2022-11-07 NOTE — Assessment & Plan Note (Addendum)
Post cardiac arrest encephalopathy. Clinically resolving Continue neuro checks per unit protocol. PT and OT

## 2022-11-07 NOTE — Progress Notes (Addendum)
Progress Note   Patient: Karen Lutz [id number] DOB: 1971-06-20 DOA: 11/01/2022     6 DOS: the patient was seen and examined on 11/07/2022   Brief hospital course: Mrs. Karen Lutz was admitted to the hospital after a cardiac arrest, presumed to be due to hypokalemia, qt prolongation.   52 yo female with the past medical history of obesity sp bariatric surgery who was found down unresponsive. EMS was called and she was in ventricular fibrillation. Patient had prompt defibrillation, converting to PEA and VT. Recurrent VF, underwent second shock and converting to SVT. Patient was placed on amiodarone and epinephrine infusions. After 23 minutes of CPR she recovered spontaneous circulation. She was admitted to the intensive care unit on invasive mechanical ventilation. Blood pressure on admission to the ICU 102/70, HR 95, RR 26 and 02 saturation 100%. Lungs with coarse breath sounds, heart with S1 and S2 present and rhythmic, abdomen not distended and no lower extremity edema.   Patient was placed non norepinephrine infusion for shock.  Echocardiogram with reduced LV systolic function and diffuse wall motion abnormalities.  Antibiotic therapy for possible aspiration pneumonia.   03/31 liberated from mechanical ventilation.  Off vasopressors.  04/10 Placed ARB and spironolactone.  04/02 Transferred to [facility]. Right and left cardiac catheterization with normal coronaries, and elevated filling pressures. Preserved cardiac output.  04/03 Cardiac MRI, consulted EP for secondary prevention ICD.     Assessment and Plan: * Cardiac arrest Cardiogenic shock Ventricular fibrillation.  Acute on chronic systolic heart failure.   Echocardiogram with reduced LV systolic function with EF 25%, global hypokinesis, severe dilatated LV cavity, no LVH, RV systolic function preserved. RVSP 34,6. LA with moderate dilatation. No pericardial effusion. No significant valvular disease.   04/02 cardiac  catheterization PA 47/28 mean 36 PCWP mean 26 PVR 1.5  Cardiac output 6.4 and index 2.9 per Fick.   Post capillary pulmonary hypertension.  Coronary angiography with normal coronaries.   Urine output 3,600 ml.  Systolic blood pressure 123456, 134, 96  SV02 66%  Plan to continue diuresis with spironolactone and added empagliflozin.  Continue Entresto.  No B blocker for now.   Hypokalemia Renal function stable with serum cr at 1,2 with K at 4,1 and serum bicarbonate at 29, Plan to continue spironolactone and follow up renal function and electrolytes in am.   Acute respiratory failure with hypoxia Respiratory failure has improved with diuresis. Her 02 saturation is 96% on room air.   She has been treated for aspiration pneumonia with antibiotic therapy with good toleration,.   Acute metabolic encephalopathy Post cardiac arrest encephalopathy. Clinically is improving.  Continue neuro checks per unit protocol. PT and OT  Class 2 obesity Calculated BMI is 38.6         Subjective: Patient is feeling better, no dyspnea or chest pain, out of bed to a chair beside the bed, her daughter is with her today.   Physical Exam: Vitals:   11/06/22 2011 11/06/22 2337 11/07/22 0402 11/07/22 0742  BP: 110/80 124/75 119/81 (!) 134/92  Pulse: 99 96 92 94  Resp: 20 20 19 18   Temp: 97.7 F (36.5 C) 98.3 F (36.8 C) 97.8 F (36.6 C) 98.4 F (36.9 C)  TempSrc: Oral Oral Oral Oral  SpO2: 97% 96% 94% 95%  Weight:   108.6 kg   Height:       Neurology awake and alert ENT with mild pallor  Cardiovascular with S1 and S2 present and rhythmic with no gallops, rubs or  murmurs No JVD No lower extremity edema Respiratory with no rales or wheezing, no rhonchi Abdomen with no distention  Data Reviewed:    Family Communication: I spoke with patient's daughter at the bedside, we talked in detail about patient's condition, plan of care and prognosis and all questions were  addressed.    Disposition: Status is: Inpatient Remains inpatient appropriate because: cardiac work up and EP consultation  Planned Discharge Destination: Home      Author: Tawni Millers, MD 11/07/2022 10:59 AM  For on call review [email or web address].

## 2022-11-07 NOTE — Progress Notes (Addendum)
Advanced Heart Failure Rounding Note  PCP-Cardiologist: None   Subjective:    3.6L in UOP yesterday w/ IV Lasix. Wt trending down. Breathing improved. Able to lay flat during cMRI w/o orthopnea.   SCr up 0.98>>1.20. SBP 110s-130s   cMRI completed. Interpretation pending.    LHC - normal cors Right Heart   Right Heart Pressures RHC Procedural Findings: Hemodynamics (mmHg) RA mean 8 RV 46/10 PA 47/28, mean 36 PCWP mean 26 LV 130/28 AO 132/78  Oxygen saturations: PA 66% AO 96%  Cardiac Output (Fick) 6.45  Cardiac Index (Fick) 2.94 PVR 1.55 WU    Objective:   Weight Range: 108.6 kg Body mass index is 38.64 kg/m.   Vital Signs:   Temp:  [97.7 F (36.5 C)-98.9 F (37.2 C)] 98.4 F (36.9 C) (04/03 0742) Pulse Rate:  [0-116] 94 (04/03 0742) Resp:  [16-28] 18 (04/03 0742) BP: (109-168)/(66-95) 134/92 (04/03 0742) SpO2:  [85 %-99 %] 95 % (04/03 0742) Weight:  [108.6 kg-115 kg] 108.6 kg (04/03 0402) Last BM Date : 11/06/22  Weight change: Filed Weights   11/06/22 0346 11/06/22 1845 11/07/22 0402  Weight: 113.4 kg 115 kg 108.6 kg    Intake/Output:   Intake/Output Summary (Last 24 hours) at 11/07/2022 0852 Last data filed at 11/06/2022 2148 Gross per 24 hour  Intake --  Output 3600 ml  Net -3600 ml      Physical Exam    General:  Well appearing. obese. No resp difficulty HEENT: Normal Neck: Supple. JVP not elevated . Carotids 2+ bilat; no bruits. No lymphadenopathy or thyromegaly appreciated. Cor: PMI nondisplaced. Regular rate & rhythm. No rubs, gallops or murmurs. Lungs: Clear Abdomen: Soft, nontender, nondistended. No hepatosplenomegaly. No bruits or masses. Good bowel sounds. Extremities: No cyanosis, clubbing, rash, edema Neuro: Alert & orientedx3, cranial nerves grossly intact. moves all 4 extremities w/o difficulty. Affect pleasant   Telemetry   NSR-ST 90s-low 100s  EKG    No new EKG to review   Labs    CBC Recent Labs     11/05/22 0430 11/06/22 0209 11/06/22 0839  WBC 4.7 5.0  --   HGB 11.0* 11.1* 11.6*  11.2*  HCT 35.1* 36.2 34.0*  33.0*  MCV 86.5 87.7  --   PLT 135* 142*  --    Basic Metabolic Panel Recent Labs    11/05/22 0430 11/06/22 0209 11/06/22 0839 11/07/22 0642  NA 140 139 143  143 139  K 3.5 3.6 3.9  3.8 4.1  CL 105 106  --  102  CO2 25 24  --  29  GLUCOSE 105* 86  --  92  BUN 17 12  --  15  CREATININE 1.02* 0.98  --  1.20*  CALCIUM 9.3 9.4  --  10.0  MG 2.4 2.1  --   --   PHOS 3.8 3.8  --   --    Liver Function Tests Recent Labs    11/05/22 0430  AST 14*  ALT 17  ALKPHOS 74  BILITOT 0.5  PROT 5.8*  ALBUMIN 2.5*   No results for input(s): "LIPASE", "AMYLASE" in the last 72 hours. Cardiac Enzymes No results for input(s): "CKTOTAL", "CKMB", "CKMBINDEX", "TROPONINI" in the last 72 hours.  BNP: BNP (last 3 results) No results for input(s): "BNP" in the last 8760 hours.  ProBNP (last 3 results) No results for input(s): "PROBNP" in the last 8760 hours.   D-Dimer No results for input(s): "DDIMER" in the last 72 hours. Hemoglobin  A1C No results for input(s): "HGBA1C" in the last 72 hours. Fasting Lipid Panel No results for input(s): "CHOL", "HDL", "LDLCALC", "TRIG", "CHOLHDL", "LDLDIRECT" in the last 72 hours. Thyroid Function Tests Recent Labs    11/06/22 0209  TSH 2.716    Other results:   Imaging    No results found.   Medications:     Scheduled Medications:  aspirin  81 mg Per Tube Daily   Chlorhexidine Gluconate Cloth  6 each Topical Q0600   dapagliflozin propanediol  10 mg Oral Daily   docusate  100 mg Per Tube BID   enoxaparin (LOVENOX) injection  40 mg Subcutaneous [id number]   folic acid  1 mg Oral Daily   furosemide  60 mg Intravenous BID   insulin aspart  0-15 Units Subcutaneous Q4H   losartan  12.5 mg Oral Daily   polyethylene glycol  17 g Per Tube Daily   sodium chloride flush  10-40 mL Intracatheter Q12H   sodium chloride flush  3  mL Intravenous Q12H   sodium chloride flush  3 mL Intravenous Q12H   spironolactone  25 mg Oral Daily   thiamine  100 mg Oral Daily    Infusions:  sodium chloride      PRN Medications: sodium chloride, acetaminophen **OR** acetaminophen (TYLENOL) oral liquid 160 mg/5 mL **OR** acetaminophen, acetaminophen, albuterol, mouth rinse, prochlorperazine, sodium chloride flush, sodium chloride flush    Patient Profile   52 y/o [name] w/ h/o obesity s/p bariatric surgery 11/23 at [facility], reported FH of premature CAD (father MI in 16s) and nd FH of CHF (2 sisters). Had Influenza A 12/23. Admitted w/ OOH VF arrest.   Assessment/Plan   1. VF arrest: Out of hospital. She was hypokalemic initially with QTc 511 msec.  F/u ECG post correction of hypokalemia showed normal QTc.  Think she will need secondary prevention ICD.  - Will need eventual EP evaluation, will get cMRI first (plan for today)  2. Acute systolic CHF: Echo with EF 25%, diffuse hypokinesis, normal RV.  Cause uncertain.  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  No CAD on cath.  Filling pressures remain elevated on cath with preserved cardiac output.  - Continue spironolactone 25 mg daily . - Stop Losartan - Start Entresto 24-26 mg bid  - Continue Farxiga 10 mg daily. - received 60 mg IV Lasix this am. Hold further doses  - cMRI completed. Interpretation pending  3. Neuro: Still with some confusion post-arrest.  She can be re-oriented  Length of Stay: [address] PA-C  11/07/2022, 8:52 AM  Advanced Heart Failure Team Pager [phone] (M-F; 7a - 5p)  Please contact [facility] for night-coverage after hours (5p -7a ) and weekends on [email or web address]  Patient seen with PA, agree with the above. Note.   She diuresed well with IV Lasix yesterday, weight down.  Denies dyspnea.  Seems less confused today.  Creatinine 1.2.   General: NAD Neck: No JVD, no thyromegaly or thyroid nodule.  Lungs:  Clear to auscultation bilaterally with normal respiratory effort. CV: Nondisplaced PMI.  Heart regular S1/S2, no S3/S4, no murmur.  No peripheral edema.   Abdomen: Soft, nontender, no hepatosplenomegaly, no distention.  Skin: Intact without lesions or rashes.  Neurologic: Alert and oriented x 3.  Psych: Normal affect. Extremities: No clubbing or cyanosis.  HEENT: Normal.   I will review the cardiac MRI done this morning.   She had Lasix IV this morning, will stop IV Lasix  now.  Can start po diuretic tomorrow.   Will stop losartan and start Entresto 24/26 bid.   Confusion post-arrest now seems improved.   She will need secondary prevention ICD, EP seeing her today.   Mobilize.   Loralie Champagne 11/07/2022 12:00 PM

## 2022-11-07 NOTE — Consult Note (Addendum)
Cardiology Consultation   Patient ID: HEAHTER BILEK MRN: [id number]; DOB: [birth year]  Admit date: 11/01/2022 Date of Consult: 11/07/2022  PCP:  Karen Lutz, [place] Providers Cardiologist:  None    Patient Profile:   Karen Lutz is a 52 y.o. female with a hx of bariatric surgery ([place] on 06/06/22), GERD  who is being seen 11/07/2022 for the evaluation of ICD implant at the request of Dr. Aundra Dubin.  History of Present Illness:   Ms. Karen Lutz admitted after OOH cardiac arrest  EMS report reviewed 01:36: Initial vitals report unresponsive/pulsless by GFD 1st recorded HR 223 Intubated (by GFD) CPR VF Defibrillation 360J SVT with faint pulse > VT/VF IO access IVF Epi Defibrillation 360J Amio 300mg  Epi Narcan Epi 01:58 CPR discontinue ROSC Epi infusion IVF Fentanyl/versed  Bystander reported that he was there to have lunch she c/o abdominal pain and stopped responding Here with her daughter at bedside, reported a few days of abdominal pain, no c/o CP  LABS K+ 2.9 > 2.9 > 3.0 > 2.6 > 3.5 > 4.0 >>>> 4.5 > 3.4 >>>> 4.1 today Mag 2.1 > 1.8 >>> 2.1 BUN 9 >>> 15 Creat 0.94 >> 1.21 >> 1.33 >>> 0.98 > 1.20 today HS Trop 27 > 99 > 178 WBC 14 > 13 >>> 5.0 H/H  11.6/34 Plts 142 TSH 2.716  Tox only benzo  TTE done day of admission LVEF 25% (then described as left ventricle has normal function. The left ventricle demonstrates global hypokinesis. The left ventricular internal cavity size was severely dilated. Left ventricular diastolic parameters are consistent with Grade II diastolic dysfunction) mod dilation LA, no VHD  Some temps required lowered temps to maintain TTM Levophed Extubated 3/31 4/1 off pressor > adding GDMT  LHC 4/2 normal coronaries, preserved CO, elevated filling pressures, mild pulm venous HTN  Initial EKGs with some QT prolongation   No QT prolonging agents on home meds list  Had FluA Dec  2023  C.MRI is completed, pending reading   The patient feels well today She has memory gaps, does not recall the day of her arrest, can notrecall passwords and some other details She does recall the prior days she thinks and doesn't remember any particular symptoms, reports good oral intake of food and water No N/V/D leading to the event either  She tells me she has been told of a heart murmur since childhood Never has had any CP, palpitations or cardiac awareness No ETOH Clean from cocaine now for 8 years  She has a sister that dies in her 22's, known seizure d/o and died during a seizure event  The patient and her daughter at bedside (with help of calling family on the phone) clarify her family hx Her father died in his 25's of heart failure, no specifics  Her father's brother and sister also dies in their 98's of unclear cardiac/CHF diagnosis'    Past Medical History:  Diagnosis Date   Acid reflux    Anemia    Fibroids    Heart murmur    Knee pain    UTI (lower urinary tract infection)     Past Surgical History:  Procedure Laterality Date   ABDOMINAL HYSTERECTOMY     BARIATRIC SURGERY     Karen Lutz x1 week ago   ceaserian     CESAREAN SECTION     CESAREAN SECTION     KNEE SURGERY Left    KNEE SURGERY  RIGHT/LEFT HEART CATH AND CORONARY ANGIOGRAPHY N/A 11/06/2022   Procedure: RIGHT/LEFT HEART CATH AND CORONARY ANGIOGRAPHY;  Surgeon: Larey Dresser, MD;  Location: [facility];  Service: Cardiovascular;  Laterality: N/A;     Home Medications:  Prior to Admission medications   Medication Sig Start Date End Date Taking? Authorizing Provider  predniSONE (STERAPRED UNI-PAK 21 TAB) 5 MG (21) TBPK tablet Take 5-10 mg by mouth See admin instructions. follow package directions (Typical regimens for 21 tablet dose packs of Methylprednisolone 4mg , Prednisone 5mg , and Prednisone 10mg ) Day 1: 2 tabs before breakfast, 1 tab after lunch, 1 tab after supper, and 2  tabs at bedtime. Day 2: 1 tab before breakfast, 1 tab after lunch, 1 tab after supper, and 2 tabs at bedtime. Day 3: 1 tab before breakfast, 1 tab after lunch, 1 tab after supper, and 1 tab at bedtime. Day 4: 1 tab before breakfast, 1 tab after lunch, and 1 tab at bedtime. Day 5: 1 tab before breakfast and 1 tab at bedtime. Day 6: 1 tab before breakfast. 10/22/22  Yes [provider]  Cholecalciferol 1.25 MG (50000 UT) capsule Take 50,000 Units by mouth once a week.    [provider]  cyanocobalamin (VITAMIN B12) 1000 MCG/ML injection Inject 1,000 mcg into the muscle every 30 (thirty) days. 10/18/22   [provider]  HYDROcodone-acetaminophen (NORCO) 10-325 MG tablet Take 1 tablet by mouth every 6 (six) hours as needed for moderate pain or severe pain. 10/22/22   [provider]  pantoprazole (PROTONIX) 40 MG tablet Take 1 tablet (40 mg total) by mouth daily. 05/04/21 06/03/21  Lutz, Karen A, PA-C  predniSONE (DELTASONE) 50 MG tablet Take 1 tablet (50 mg total) by mouth daily with breakfast. 07/23/22   Karen Eagles, PA-C  sucralfate (CARAFATE) 1 g tablet Take 1 tablet (1 g total) by mouth 4 (four) times daily. 05/04/21 06/03/21  Lutz, Karen A, PA-C  albuterol (VENTOLIN HFA) 108 (90 Base) MCG/ACT inhaler Inhale 2 puffs into the lungs every 4 (four) hours as needed for wheezing or shortness of breath. Patient not taking: Reported on 10/30/2020 09/08/20 11/24/20  Hans Eden, NP    Inpatient Medications: Scheduled Meds:  aspirin  81 mg Per Tube Daily   Chlorhexidine Gluconate Cloth  6 each Topical Q0600   dapagliflozin propanediol  10 mg Oral Daily   docusate  100 mg Per Tube BID   enoxaparin (LOVENOX) injection  40 mg Subcutaneous [id number]   folic acid  1 mg Oral Daily   furosemide  60 mg Intravenous BID   insulin aspart  0-15 Units Subcutaneous Q4H   losartan  12.5 mg Oral Daily   polyethylene glycol  17 g Per Tube Daily   sodium chloride flush  10-40 mL  Intracatheter Q12H   sodium chloride flush  3 mL Intravenous Q12H   sodium chloride flush  3 mL Intravenous Q12H   spironolactone  25 mg Oral Daily   thiamine  100 mg Oral Daily   Continuous Infusions:  sodium chloride     PRN Meds: sodium chloride, acetaminophen **OR** acetaminophen (TYLENOL) oral liquid 160 mg/5 mL **OR** acetaminophen, acetaminophen, albuterol, mouth rinse, prochlorperazine, sodium chloride flush, sodium chloride flush  Allergies:    Allergies  Allergen Reactions   Penicillins Rash    Has patient had a PCN reaction causing immediate rash, facial/tongue/throat swelling, SOB or lightheadedness with hypotension: unknown Has patient had a PCN reaction causing severe rash involving mucus membranes or  skin necrosis: unknown Has patient had a PCN reaction that required hospitalization : yes Has patient had a PCN reaction occurring within the last 10 years: no If all of the above answers are "NO", then may proceed with Cephalosporin use.    Penicillin G Other (See Comments)    Per patient, childhood allergy, reaction unknown.      Social History:   Social History   Socioeconomic History   Marital status: Legally Separated    Spouse name: Not on file   Number of children: Not on file   Years of education: Not on file   Highest education level: Not on file  Occupational History   Not on file  Tobacco Use   Smoking status: Never   Smokeless tobacco: Never  Vaping Use   Vaping Use: Never used  Substance and Sexual Activity   Alcohol use: No   Drug use: Never   Sexual activity: Yes    Birth control/protection: None  Other Topics Concern   Not on file  Social History Narrative   Not on file   Social Determinants of Health   Financial Resource Strain: Low Risk  (11/02/2022)   Overall Financial Resource Strain (CARDIA)    Difficulty of Paying Living Expenses: Not hard at all  Food Insecurity: No Food Insecurity (11/02/2022)   Hunger Vital Sign    Worried  About Running Out of Food in the Last Year: Never true    [place] in the Last Year: Never true  Transportation Needs: No Transportation Needs (11/02/2022)   PRAPARE - Hydrologist (Medical): No    Lack of Transportation (Non-Medical): No  Physical Activity: Not on file  Stress: Not on file  Social Connections: Not on file  Intimate Partner Violence: Not on file    Family History:   Family History  Problem Relation Age of Onset   Hypertension Other    Cancer Other    Alcohol abuse Mother    Heart disease Mother    Alcohol abuse Father    Heart disease Father    Arthritis Maternal Grandmother    Arthritis Maternal Grandfather    Arthritis Paternal Grandmother    Arthritis Paternal Grandfather      ROS:  Please see the history of present illness.  All other ROS reviewed and negative.     Physical Exam/Data:   Vitals:   11/06/22 2011 11/06/22 2337 11/07/22 0402 11/07/22 0742  BP: 110/80 124/75 119/81 (!) 134/92  Pulse: 99 96 92 94  Resp: 20 20 19 18   Temp: 97.7 F (36.5 C) 98.3 F (36.8 C) 97.8 F (36.6 C) 98.4 F (36.9 C)  TempSrc: Oral Oral Oral Oral  SpO2: 97% 96% 94% 95%  Weight:   108.6 kg   Height:        Intake/Output Summary (Last 24 hours) at 11/07/2022 1002 Last data filed at 11/06/2022 2148 Gross per 24 hour  Intake --  Output 3600 ml  Net -3600 ml      11/07/2022    4:02 AM 11/06/2022    6:45 PM 11/06/2022    3:46 AM  Last 3 Weights  Weight (lbs) 239 lb 6.4 oz 253 lb 8.5 oz 250 lb 1.6 oz  Weight (kg) 108.591 kg 115 kg 113.445 kg     Body mass index is 38.64 kg/m.  General:  Well nourished, well developed, in no acute distress HEENT: small abrasion R forehead Neck: no JVD Vascular: No  carotid bruits; Distal pulses 2+ bilaterally Cardiac:  RRR; tachycardic, no murmurs, gallops or rubs Lungs:  CTA b/l, no wheezing, rhonchi or rales  Abd: soft, nontender Ext: no edema Musculoskeletal:  No deformities Skin: warm  and dry  Neuro:  no focal abnormalities noted Psych:  Normal affect   EKG:  The EKG was personally reviewed and demonstrates:   SR 98bpm, QTc 511 SR 66 QTc 522 SR 95bpm Qtc 467,s  OLD 06/17/22 SR 89bpm, QTc 452   Telemetry:  Telemetry was personally reviewed and demonstrates:   SR/ST 90's-100's, she has occ OVCs, rare couplets NSVTs, 4-6 beats She has 2 PVC morphologies She has unclear tachycardia with a very short PR, ? Junctional tach  Relevant CV Studies:    11/06/22: R/LHC 1. Normal coronaries.  2. Elevated filling pressures.  3. Mild pulmonary venous hypertension.  4. Preserved cardiac output.  RA mean 8 RV 46/10 PA 47/28, mean 36 PCWP mean 26 LV 130/28 AO 132/78  Oxygen saturations: PA 66% AO 96%  Cardiac Output (Fick) 6.45  Cardiac Index (Fick) 2.94 PVR 1.55 WU   11/01/22: 11/01/22 1. Global hypokinesis worse in septum and apex. Left ventricular ejection  fraction, by estimation, is 25%. The left ventricle has normal function.  The left ventricle demonstrates global hypokinesis. The left ventricular  internal cavity size was severely   dilated. Left ventricular diastolic parameters are consistent with Grade  II diastolic dysfunction (pseudonormalization). Elevated left ventricular  end-diastolic pressure.   2. Right ventricular systolic function is normal. The right ventricular  size is normal. There is normal pulmonary artery systolic pressure.   3. Left atrial size was moderately dilated.   4. The mitral valve is abnormal. Mild mitral valve regurgitation. No  evidence of mitral stenosis.   5. The aortic valve is tricuspid. Aortic valve regurgitation is not  visualized. No aortic stenosis is present.   6. The inferior vena cava is normal in size with greater than 50%  respiratory variability, suggesting right atrial pressure of 3 mmHg.    Laboratory Data:  High Sensitivity Troponin:   Recent Labs  Lab 11/01/22 0250 11/01/22 0440 11/01/22 1213   TROPONINIHS 27* 99* 178*     Chemistry Recent Labs  Lab 11/04/22 0410 11/05/22 0430 11/06/22 0209 11/06/22 0839 11/07/22 0642  NA 140 140 139 143  143 139  K 3.8 3.5 3.6 3.9  3.8 4.1  CL 108 105 106  --  102  CO2 24 25 24   --  29  GLUCOSE 116* 105* 86  --  92  BUN 20 17 12   --  15  CREATININE 1.20* 1.02* 0.98  --  1.20*  CALCIUM 9.0 9.3 9.4  --  10.0  MG 1.8 2.4 2.1  --   --   GFRNONAA 55* >60 >60  --  55*  ANIONGAP 8 10 9   --  8    Recent Labs  Lab 11/03/22 0400 11/04/22 0410 11/05/22 0430  PROT 5.3* 5.6* 5.8*  ALBUMIN 2.4* 2.5* 2.5*  AST 23 16 14*  ALT 32 23 17  ALKPHOS 83 79 74  BILITOT 0.5 0.6 0.5   Lipids No results for input(s): "CHOL", "TRIG", "HDL", "LABVLDL", "LDLCALC", "CHOLHDL" in the last 168 hours.  Hematology Recent Labs  Lab 11/04/22 0410 11/05/22 0430 11/06/22 0209 11/06/22 0839  WBC 5.7 4.7 5.0  --   RBC 4.13 4.06 4.13  --   HGB 11.4* 11.0* 11.1* 11.6*  11.2*  HCT 34.3* 35.1* 36.2 34.0*  33.0*  MCV 83.1 86.5 87.7  --   MCH 27.6 27.1 26.9  --   MCHC 33.2 31.3 30.7  --   RDW 15.5 15.6* 15.4  --   PLT 135* 135* 142*  --    Thyroid  Recent Labs  Lab 11/06/22 0209  TSH 2.716    BNPNo results for input(s): "BNP", "PROBNP" in the last 168 hours.  DDimer No results for input(s): "DDIMER" in the last 168 hours.   Radiology/Studies:   DG CHEST PORT 1 VIEW Result Date: 11/05/2022 CLINICAL DATA:  NG tube placement EXAM: PORTABLE CHEST 1 VIEW COMPARISON:  CXR 11/03/22 FINDINGS: Right arm PICC with tip near the cavoatrial junction/upper right atrium. Endotracheal tube terminates approximately 1.5 cm above the carina. Enteric tube courses below diaphragm with the side hole and tip out of the field of view. No pleural effusion. No pneumothorax. Cardiomegaly. Compared to prior exam there is increased bilateral perihilar opacities, which could be seen in the setting of pulmonary edema or atypical infection. Visualized upper abdomen is unremarkable.  No radiographically apparent displaced rib fractures. IMPRESSION: Increased bilateral perihilar opacities, which could be seen in the setting of pulmonary edema. Cardiomegaly. Electronically Signed   By: Marin Roberts M.D.   On: 11/05/2022 07:26   MR CERVICAL SPINE WO CONTRAST Result Date: 11/04/2022 CLINICAL DATA:  Myelopathy, chronic, cervical spine. EXAM: LIMITED MRI CERVICAL SPINE WITHOUT CONTRAST TECHNIQUE: Sagittal multisequence MR imaging of the cervical spine was performed. Patient was unable to complete the examination. No axial imaging obtained. No intravenous contrast was administered. COMPARISON:  Cervical spine CT 06/24/2020. Cervical spine radiographs 07/26/2022. FINDINGS: Despite efforts by the technologist and patient, moderate motion artifact is present on today's exam and could not be eliminated. This reduces exam sensitivity and specificity. As above, patient unable to complete examination. No axial images obtained. Alignment: Normal. Vertebrae: No evidence of acute fracture or traumatic subluxation. No suspicious focal osseous lesions. Cord: Normal in signal and caliber. No evidence for myelopathy on sagittal imaging. The spinal canal appears widely patent. Posterior Fossa, vertebral arteries, paraspinal tissues: The posterior fossa appears unremarkable as imaged in the sagittal plane. No paraspinal abnormalities identified on limited sagittal imaging. Disc levels: Sagittal images demonstrate mild spondylosis at C4-5 with asymmetric uncinate spurring on the right. This does not appear to cause more than mild right-sided foraminal narrowing based on the sagittal images. No large disc herniation, cord deformity or high-grade foraminal narrowing identified. IMPRESSION: 1. Limited examination. The patient was unable to complete the examination due to claustrophobia. No axial images obtained. 2. No evidence of cord deformity, myelopathy or significant spinal stenosis. 3. Mild spondylosis at C4-5  with asymmetric uncinate spurring on the right contributing to mild right-sided foraminal narrowing. No large disc herniation, cord deformity or high-grade foraminal narrowing identified. Electronically Signed   By: Richardean Sale M.D.   On: 11/04/2022 16:47     Assessment and Plan:   OOH cardiac arrest EMS as above Initial rhythm (paddles) is noisy, probably VT?  HR reported 233 No pre-excitation on her EKGs, on telemetry she has often a very short PR No hx of syncope Unclear family his by her father and his brother/sister died in the 74's of CHF/cardiac causes  C/MRI is pending read Genetic testing planned out pt with the HF team  Secondary prevention ICD  Dr. 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  The striking left ventricular enlargement from the little bit of reading I been able to do suggests that there may be an underlying cardiomyopathy as this was not a parameter noted to be as significantly enlarged as in this patient in a recent ECMO study following cardiac arrest.  The fact that she has fragmented QRS further supports the hypothesis of underlying cardiomyopathy which might be the explanation for her cardiac arrest; in addition she has Q waves anteriorly the explanation of which is not yet clear  In this regard she has had very fast nonsustained ventricular tachycardia cycle length about 220 ms  and a surprisingly narrow QRS.  She also has evidence of preexcitation with a relatively narrow QRS also suggesting an atrial fascicular or atrial nodal bypass tract with a different P wave morphology suggesting a conduction over the bypass tract requires a proximate triggering beat.  It is not clear to me whether this cohort of patients would be a risk for sudden cardiac death with rapid conduction over a bypass tract in the setting of atrial fibrillation, I guess it would relate to the insertion site on the AV node and how much AV nodal slowing would be incurred following activation, the fact that there is no isoelectric PR interval suggest that there is very little slowing.  This would need to be excluded by EP study  Other exclusions would be long QT syndrome, an interesting paper suggesting that there is QT shortening following gastric bypass surgery, the mechanism was not available to the cursory review that I had the fact that her QT interval was not significantly prolonged before her bariatric surgery and it has normalized now suggest that this would likely not be the culprit and that the QT prolongation following the arrest would be in the context of the arrest and the relative hypothermia  If further imaging is not revealing, it would be appropriate however to give her an epinephrine infusion to exclude long QT.  The fact that the event occurred at rest makes Catecholaminergic polymorphic VT unlikely.  There is nothing to suggest Brugada we will do an electrocardiogram with leads in the second intercostal space  There was death in her father's family at the age of 3 or so, I was unable to access her father's record when he died at [facility]   She is advised today, albeit with not certain recall, that she is not allowed to drive for 6 months

## 2022-11-07 NOTE — Assessment & Plan Note (Signed)
Respiratory failure has improved with diuresis. Her 02 saturation is 96% on room air.   She has been treated for aspiration pneumonia with antibiotic therapy with good toleration,.

## 2022-11-08 DIAGNOSIS — I469 Cardiac arrest, cause unspecified: Secondary | ICD-10-CM | POA: Diagnosis not present

## 2022-11-08 DIAGNOSIS — R7303 Prediabetes: Secondary | ICD-10-CM

## 2022-11-08 DIAGNOSIS — E876 Hypokalemia: Secondary | ICD-10-CM | POA: Diagnosis not present

## 2022-11-08 DIAGNOSIS — J9601 Acute respiratory failure with hypoxia: Secondary | ICD-10-CM | POA: Diagnosis not present

## 2022-11-08 DIAGNOSIS — G9341 Metabolic encephalopathy: Secondary | ICD-10-CM | POA: Diagnosis not present

## 2022-11-08 LAB — GLUCOSE, CAPILLARY
Glucose-Capillary: 77 mg/dL (ref 70–99)
Glucose-Capillary: 78 mg/dL (ref 70–99)
Glucose-Capillary: 87 mg/dL (ref 70–99)
Glucose-Capillary: 96 mg/dL (ref 70–99)
Glucose-Capillary: 96 mg/dL (ref 70–99)
Glucose-Capillary: 97 mg/dL (ref 70–99)

## 2022-11-08 LAB — BASIC METABOLIC PANEL
Anion gap: 13 (ref 5–15)
BUN: 18 mg/dL (ref 6–20)
CO2: 28 mmol/L (ref 22–32)
Calcium: 10.1 mg/dL (ref 8.9–10.3)
Chloride: 98 mmol/L (ref 98–111)
Creatinine, Ser: 1.18 mg/dL — ABNORMAL HIGH (ref 0.44–1.00)
GFR, Estimated: 56 mL/min — ABNORMAL LOW (ref >60)
Glucose, Bld: 84 mg/dL (ref 70–99)
Potassium: 3.5 mmol/L (ref 3.5–5.1)
Sodium: 139 mmol/L (ref 135–145)

## 2022-11-08 LAB — MAGNESIUM: Magnesium: 1.9 mg/dL (ref 1.7–2.4)

## 2022-11-08 LAB — LIPOPROTEIN A (LPA): Lipoprotein (a): 209.1 nmol/L — ABNORMAL HIGH (ref <75.0)

## 2022-11-08 MED ORDER — SACUBITRIL-VALSARTAN 49-51 MG PO TABS
1.0000 | ORAL_TABLET | Freq: Two times a day (BID) | ORAL | Status: DC
  Administered 2022-11-08 – 2022-11-10 (×3): 1 via ORAL
  Filled 2022-11-08 (×5): qty 1

## 2022-11-08 MED ORDER — FUROSEMIDE 40 MG PO TABS
40.0000 mg | ORAL_TABLET | Freq: Every day | ORAL | Status: DC
  Administered 2022-11-08 – 2022-11-10 (×3): 40 mg via ORAL
  Filled 2022-11-08 (×3): qty 1

## 2022-11-08 MED ORDER — ADULT MULTIVITAMIN W/MINERALS CH
1.0000 | ORAL_TABLET | Freq: Every day | ORAL | Status: DC
  Administered 2022-11-08 – 2022-11-10 (×3): 1 via ORAL
  Filled 2022-11-08 (×3): qty 1

## 2022-11-08 MED ORDER — ENSURE MAX PROTEIN PO LIQD
11.0000 [oz_av] | Freq: Two times a day (BID) | ORAL | Status: DC
  Administered 2022-11-08: 11 [oz_av] via ORAL
  Filled 2022-11-08 (×5): qty 330

## 2022-11-08 MED ORDER — POTASSIUM CHLORIDE CRYS ER 20 MEQ PO TBCR
40.0000 meq | EXTENDED_RELEASE_TABLET | Freq: Two times a day (BID) | ORAL | Status: AC
  Administered 2022-11-08 (×2): 40 meq via ORAL
  Filled 2022-11-08 (×2): qty 2

## 2022-11-08 NOTE — Progress Notes (Addendum)
Advanced Heart Failure Rounding Note  PCP-Cardiologist: None   Subjective:    Only 336mL UOP documented yesterday with 60 IV lasix. Weight stable.   SCr downtrending 0.98>>1.20>1.18. SBP 120s  cMRI 4/3: EF 22%, normal RV size RV EF 50%, No myocardial LGE, so no definitive evidence for prior MI, myocarditis, or infiltrative disease  Mentation improving, A&O x4 but still difficulty recalling certain things. Denies CP/SOB. States she has walked in the halls with no complaints. CVP 8/9  LHC - normal cors  Right Heart Pressures RHC Procedural Findings: Hemodynamics (mmHg) RA mean 8 RV 46/10 PA 47/28, mean 36 PCWP mean 26 LV 130/28 AO 132/78  Oxygen saturations: PA 66% AO 96%  Cardiac Output (Fick) 6.45  Cardiac Index (Fick) 2.94 PVR 1.55 WU    Objective:   Weight Range: 108.5 kg Body mass index is 38.61 kg/m.   Vital Signs:   Temp:  [98 F (36.7 C)-98.5 F (36.9 C)] 98.2 F (36.8 C) (04/04 0830) Pulse Rate:  [90-105] 100 (04/04 0830) Resp:  [16-18] 16 (04/04 0830) BP: (94-125)/(63-89) 124/83 (04/04 0830) SpO2:  [88 %-99 %] 88 % (04/04 0829) Weight:  [108.5 kg] 108.5 kg (04/04 0530) Last BM Date : 11/06/22  Weight change: Filed Weights   11/06/22 1845 11/07/22 0402 11/08/22 0530  Weight: 115 kg 108.6 kg 108.5 kg   Intake/Output:   Intake/Output Summary (Last 24 hours) at 11/08/2022 1046 Last data filed at 11/08/2022 0343 Gross per 24 hour  Intake --  Output 300 ml  Net -300 ml    Physical Exam   CVP 8/9 General:  well appearing.  No respiratory difficulty HEENT: normal Neck: supple. JVD ~8 cm. Carotids 2+ bilat; no bruits. No lymphadenopathy or thyromegaly appreciated. Cor: PMI nondisplaced. Regular rate & rhythm. No rubs, gallops or murmurs. Lungs: clear Abdomen: soft, nontender, nondistended. No hepatosplenomegaly. No bruits or masses. Good bowel sounds. Extremities: no cyanosis, clubbing, rash, trace BLE edema. PICC RUE  Neuro: alert & oriented  x 3, cranial nerves grossly intact. moves all 4 extremities w/o difficulty. Affect pleasant.   Telemetry   NSR low 100s, 1-3 PVCs/hr (Personally reviewed)    EKG    No new EKG to review   Labs    CBC Recent Labs    11/06/22 0209 11/06/22 0839  WBC 5.0  --   HGB 11.1* 11.6*  11.2*  HCT 36.2 34.0*  33.0*  MCV 87.7  --   PLT 142*  --    Basic Metabolic Panel Recent Labs    11/06/22 0209 11/06/22 0839 11/07/22 0642 11/08/22 0401  NA 139   < > 139 139  K 3.6   < > 4.1 3.5  CL 106  --  102 98  CO2 24  --  29 28  GLUCOSE 86  --  92 84  BUN 12  --  15 18  CREATININE 0.98  --  1.20* 1.18*  CALCIUM 9.4  --  10.0 10.1  MG 2.1  --   --  1.9  PHOS 3.8  --   --   --    < > = values in this interval not displayed.   Liver Function Tests No results for input(s): "AST", "ALT", "ALKPHOS", "BILITOT", "PROT", "ALBUMIN" in the last 72 hours.  No results for input(s): "LIPASE", "AMYLASE" in the last 72 hours. Cardiac Enzymes No results for input(s): "CKTOTAL", "CKMB", "CKMBINDEX", "TROPONINI" in the last 72 hours.  BNP: BNP (last 3 results) No results for  input(s): "BNP" in the last 8760 hours.  ProBNP (last 3 results) No results for input(s): "PROBNP" in the last 8760 hours.   D-Dimer No results for input(s): "DDIMER" in the last 72 hours. Hemoglobin A1C No results for input(s): "HGBA1C" in the last 72 hours. Fasting Lipid Panel No results for input(s): "CHOL", "HDL", "LDLCALC", "TRIG", "CHOLHDL", "LDLDIRECT" in the last 72 hours. Thyroid Function Tests Recent Labs    11/06/22 0209  TSH 2.716   Other results:  Imaging    No results found.   Medications:     Scheduled Medications:  aspirin  81 mg Per Tube Daily   Chlorhexidine Gluconate Cloth  6 each Topical Q0600   dapagliflozin propanediol  10 mg Oral Daily   docusate  100 mg Per Tube BID   folic acid  1 mg Oral Daily   insulin aspart  0-15 Units Subcutaneous Q4H   polyethylene glycol  17 g Per  Tube Daily   sacubitril-valsartan  1 tablet Oral BID   sodium chloride flush  10-40 mL Intracatheter Q12H   sodium chloride flush  3 mL Intravenous Q12H   sodium chloride flush  3 mL Intravenous Q12H   spironolactone  25 mg Oral Daily   thiamine  100 mg Oral Daily    Infusions:  sodium chloride      PRN Medications: sodium chloride, acetaminophen **OR** acetaminophen (TYLENOL) oral liquid 160 mg/5 mL **OR** acetaminophen, acetaminophen, albuterol, mouth rinse, prochlorperazine, sodium chloride flush, sodium chloride flush  Patient Profile   52 y/o [name] w/ h/o obesity s/p bariatric surgery 11/23 at [facility], reported FH of premature CAD (father MI in 100s) and nd FH of CHF (2 sisters). Had Influenza A 12/23. Admitted w/ OOH VF arrest.   Assessment/Plan   1. VF arrest: Out of hospital. She was hypokalemic initially with QTc 511 msec.  F/u ECG post correction of hypokalemia showed normal QTc.  Think she will need secondary prevention ICD. She also  - EP following, plan for ICD possibly Monday depending on mental status - Keep K >4 and Mg >2  2. Acute systolic CHF: Echo with EF 25%, diffuse hypokinesis, normal RV.  Cause uncertain.  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  No CAD on cath.  Filling pressures remain elevated on cath with preserved cardiac output. Cardiac MRI with LV EF 22%, RV EF 50%, no myocardial LGE.  - Continue Spironolactone 25 mg daily . - Increase Entresto 24-26>49/51 mg bid  - Continue Farxiga 10 mg daily. - CVP 8-9/ Got 60 IV lasix yesterday, will start lasix 40 PO daily  - cMRI 4/3: EF 22%, normal RV size RV EF 50%, No myocardial LGE, so no definitive evidence for prior MI, myocarditis, or infiltrative disease  3. Neuro: Still with some confusion post-arrest.  Improving.   Length of Stay: [place], NP  11/08/2022, 10:46 AM  Advanced Heart Failure Team Pager [phone] (M-F; 7a - 5p)  Please contact [facility] for  night-coverage after hours (5p -7a ) and weekends on [email or web address]  Patient seen with NP, agree with the above note.   She feels good this morning.  CVP around 8.  Creatinine stable.  BP stable.   General: NAD Neck: No JVD, no thyromegaly or thyroid nodule.  Lungs: Clear to auscultation bilaterally with normal respiratory effort. CV: Nondisplaced PMI.  Heart regular S1/S2, no S3/S4, no murmur.  No peripheral edema.   Abdomen: Soft, nontender, no hepatosplenomegaly, no distention.  Skin: Intact without lesions or rashes.  Neurologic: Alert and oriented x 3.  Psych: Normal affect. Extremities: No clubbing or cyanosis.  HEENT: Normal.   Patient needs secondary prevention ICD.  She is quite clear today and is able to describe her conversation with Dr. Caryl Comes.  I think she is ready for ICD placement tomorrow, will contact EP and see if we can move this up from Monday as she likely can go home over weekend after ICD.   Nonischemic CMP, likely familial. CVP mildly elevated. Increase Entresto 49/51 bid, start Lasix 40 mg po daily.   Loralie Champagne 11/08/2022 1:01 PM

## 2022-11-08 NOTE — Progress Notes (Addendum)
Progress Note   Patient: Karen Lutz [id number] DOB: [birth year] DOA: 11/01/2022     7 DOS: the patient was seen and examined on 11/08/2022   Brief hospital course: Mrs. Karen Lutz was admitted to the hospital after a cardiac arrest, presumed to be due to hypokalemia, qt prolongation.   52 yo female with the past medical history of obesity sp bariatric surgery who was found down unresponsive. EMS was called and she was in ventricular fibrillation. Patient had prompt defibrillation, converting to PEA and VT. Recurrent VF, underwent second shock and converting to SVT. Patient was placed on amiodarone and epinephrine infusions. After 23 minutes of CPR she recovered spontaneous circulation. She was admitted to the intensive care unit on invasive mechanical ventilation. Blood pressure on admission to the ICU 102/70, HR 95, RR 26 and 02 saturation 100%. Lungs with coarse breath sounds, heart with S1 and S2 present and rhythmic, abdomen not distended and no lower extremity edema.   Patient was placed non norepinephrine infusion for shock.  Echocardiogram with reduced LV systolic function and diffuse wall motion abnormalities.  Antibiotic therapy for possible aspiration pneumonia.   03/31 liberated from mechanical ventilation.  Off vasopressors.  04/10 Placed ARB and spironolactone.  04/02 Transferred to [place]. Right and left cardiac catheterization with normal coronaries, and elevated filling pressures. Preserved cardiac output.  04/03 Cardiac MRI, consulted EP for secondary prevention ICD.     Assessment and Plan: * Cardiac arrest Cardiogenic shock Ventricular fibrillation.  Acute on chronic systolic heart failure.   Echocardiogram with reduced LV systolic function with EF 25%, global hypokinesis, severe dilatated LV cavity, no LVH, RV systolic function preserved. RVSP 34,6. LA with moderate dilatation. No pericardial effusion. No significant valvular disease.   04/02 cardiac  catheterization PA 47/28 mean 36 PCWP mean 26 PVR 1.5  Cardiac output 6.4 and index 2.9 per Fick.   Post capillary pulmonary hypertension.  Coronary angiography with normal coronaries.   Plan to continue diuresis with furosemide, spironolactone and dapagliflozin.  Entresto, holding on B blockade  Plan for ICD implantation for secondary prophylaxis V fib and V tach.  Continue Entresto.  No B blocker for now.   Hypokalemia Electrolytes have been corrected with serum K at 3,5 with Mg at 1,9.  Renal function stable with serum cr at 1,18   Acute respiratory failure with hypoxia Respiratory failure has improved with diuresis. Her 02 saturation is 96% on room air.   She has been treated for aspiration pneumonia with antibiotic therapy with good toleration,.   Acute metabolic encephalopathy Post cardiac arrest encephalopathy. Clinically resolved. Continue neuro checks per unit protocol. PT and OT  Class 2 obesity Calculated BMI is 38.6   Prediabetes Hgb A1c 5,1  Will discontinue insulin therapy and check CBG only as needed.         Subjective: Patient is feeling well with no dyspnea or chest pain, , no edema, PND or orthopnea.   Physical Exam: Vitals:   11/08/22 0829 11/08/22 0830 11/08/22 1100 11/08/22 1617  BP:  124/83 96/64 102/66  Pulse: 90 100 93 94  Resp:  16 18 17   Temp:  98.2 F (36.8 C) 98.3 F (36.8 C) 98 F (36.7 C)  TempSrc:  Oral Oral Oral  SpO2: (!) 88% 98% 93% 97%  Weight:      Height:       Neurology awake and alert ENT with no pallor Cardiovascular with S1 and S2 present and rhythmic with no gallops rubs or murmurs No  JVD No lower extremity edema Respiratory with no rales or wheezing Abdomen with no distention   Data Reviewed:    Family Communication: no family at the bedside   Disposition: Status is: Inpatient Remains inpatient appropriate because: pending ICD implantation   Planned Discharge Destination:  Home      Author: Tawni Millers, MD 11/08/2022 5:39 PM  For on call review [email or web address].

## 2022-11-08 NOTE — Progress Notes (Addendum)
Rounding Note    Patient Name: Karen Lutz Date of Encounter: 11/08/2022  [place] Cardiologist: None prior  Subjective   No complaints this morning  Inpatient Medications    Scheduled Meds:  aspirin  81 mg Per Tube Daily   Chlorhexidine Gluconate Cloth  6 each Topical Q0600   dapagliflozin propanediol  10 mg Oral Daily   docusate  100 mg Per Tube BID   folic acid  1 mg Oral Daily   insulin aspart  0-15 Units Subcutaneous Q4H   polyethylene glycol  17 g Per Tube Daily   sacubitril-valsartan  1 tablet Oral BID   sodium chloride flush  10-40 mL Intracatheter Q12H   sodium chloride flush  3 mL Intravenous Q12H   sodium chloride flush  3 mL Intravenous Q12H   spironolactone  25 mg Oral Daily   thiamine  100 mg Oral Daily   Continuous Infusions:  sodium chloride     PRN Meds: sodium chloride, acetaminophen **OR** acetaminophen (TYLENOL) oral liquid 160 mg/5 mL **OR** acetaminophen, acetaminophen, albuterol, mouth rinse, prochlorperazine, sodium chloride flush, sodium chloride flush   Vital Signs    Vitals:   11/08/22 0341 11/08/22 0530 11/08/22 0721 11/08/22 0829  BP: 125/82  124/88   Pulse: 96  90 90  Resp: 18  17   Temp: 98.5 F (36.9 C)  98.1 F (36.7 C)   TempSrc: Oral  Oral   SpO2: 99%  99% (!) 88%  Weight:  108.5 kg    Height:        Intake/Output Summary (Last 24 hours) at 11/08/2022 0833 Last data filed at 11/08/2022 0343 Gross per 24 hour  Intake --  Output 300 ml  Net -300 ml      11/08/2022    5:30 AM 11/07/2022    4:02 AM 11/06/2022    6:45 PM  Last 3 Weights  Weight (lbs) 239 lb 3.2 oz 239 lb 6.4 oz 253 lb 8.5 oz  Weight (kg) 108.5 kg 108.591 kg 115 kg      Telemetry    SR/ST brief SVT, intermittent short PR rhythm continues, no VT - Personally Reviewed  ECG    No new EKGs - Personally Reviewed  Physical Exam   GEN: No acute distress.   Neck: No JVD Cardiac: RRR, no murmurs, rubs, or gallops.  Respiratory: CTA  b/l. GI: Soft, nontender, non-distended  MS: No edema; No deformity. Neuro:  Nonfocal remains with poor memory/recall Psych: Normal affect   Labs    High Sensitivity Troponin:   Recent Labs  Lab 11/01/22 0250 11/01/22 0440 11/01/22 1213  TROPONINIHS 27* 99* 178*     Chemistry Recent Labs  Lab 11/03/22 0400 11/04/22 0410 11/05/22 0430 11/06/22 0209 11/06/22 0839 11/07/22 0642 11/08/22 0401  NA 137 140 140 139 143  143 139 139  K 3.4* 3.8 3.5 3.6 3.9  3.8 4.1 3.5  CL 107 108 105 106  --  102 98  CO2 22 24 25 24   --  29 28  GLUCOSE 161* 116* 105* 86  --  92 84  BUN 18 20 17 12   --  15 18  CREATININE 1.33* 1.20* 1.02* 0.98  --  1.20* 1.18*  CALCIUM 9.2 9.0 9.3 9.4  --  10.0 10.1  MG 2.1 1.8 2.4 2.1  --   --  1.9  PROT 5.3* 5.6* 5.8*  --   --   --   --   ALBUMIN 2.4* 2.5*  2.5*  --   --   --   --   AST 23 16 14*  --   --   --   --   ALT 32 23 17  --   --   --   --   ALKPHOS 83 79 74  --   --   --   --   BILITOT 0.5 0.6 0.5  --   --   --   --   GFRNONAA 49* 55* >60 >60  --  55* 56*  ANIONGAP 8 8 10 9   --  8 13    Lipids No results for input(s): "CHOL", "TRIG", "HDL", "LABVLDL", "LDLCALC", "CHOLHDL" in the last 168 hours.  Hematology Recent Labs  Lab 11/04/22 0410 11/05/22 0430 11/06/22 0209 11/06/22 0839  WBC 5.7 4.7 5.0  --   RBC 4.13 4.06 4.13  --   HGB 11.4* 11.0* 11.1* 11.6*  11.2*  HCT 34.3* 35.1* 36.2 34.0*  33.0*  MCV 83.1 86.5 87.7  --   MCH 27.6 27.1 26.9  --   MCHC 33.2 31.3 30.7  --   RDW 15.5 15.6* 15.4  --   PLT 135* 135* 142*  --    Thyroid  Recent Labs  Lab 11/06/22 0209  TSH 2.716    BNPNo results for input(s): "BNP", "PROBNP" in the last 168 hours.  DDimer No results for input(s): "DDIMER" in the last 168 hours.   Radiology      Cardiac Studies   11/07/22: c.MRI IMPRESSION: 1.  Moderately dilated LV with global hypokinesis, EF 22%. 2.  Normal RV size with EF 50%. 3. No myocardial LGE, so no definitive evidence for prior  MI, myocarditis, or infiltrative disease.  11/06/22: R/LHC 1. Normal coronaries.  2. Elevated filling pressures.  3. Mild pulmonary venous hypertension.  4. Preserved cardiac output.  RA mean 8 RV 46/10 PA 47/28, mean 36 PCWP mean 26 LV 130/28 AO 132/78  Oxygen saturations: PA 66% AO 96%  Cardiac Output (Fick) 6.45  Cardiac Index (Fick) 2.94 PVR 1.55 WU    11/01/22: 11/01/22 1. Global hypokinesis worse in septum and apex. Left ventricular ejection  fraction, by estimation, is 25%. The left ventricle has normal function.  The left ventricle demonstrates global hypokinesis. The left ventricular  internal cavity size was severely   dilated. Left ventricular diastolic parameters are consistent with Grade  II diastolic dysfunction (pseudonormalization). Elevated left ventricular  end-diastolic pressure.   2. Right ventricular systolic function is normal. The right ventricular  size is normal. There is normal pulmonary artery systolic pressure.   3. Left atrial size was moderately dilated.   4. The mitral valve is abnormal. Mild mitral valve regurgitation. No  evidence of mitral stenosis.   5. The aortic valve is tricuspid. Aortic valve regurgitation is not  visualized. No aortic stenosis is present.   6. The inferior vena cava is normal in size with greater than 50%  respiratory variability, suggesting right atrial pressure of 3 mmHg.     Patient Profile     52 y.o. female with a hx of bariatric surgery (DUODENAL SWITCH LAPAROSCOPIC on 06/06/22), GERD admitted with cardiac arrest  Assessment & Plan    OOH cardiac arrest Suspect NICM is not new post arrest and likely etiology of her event  Short PR, likely a pathway, suggesting an atrial fascicular or atrial nodal bypass tract  No further NSVTs Did have brief SVT Occ PVCs  3.  NICM  4.  She will need a secondary prevention ICD Post arrest hypoxic encephalopathy   Mental status/memory remains poor She did not recall  visiting with Dr. Graciela Husbands yesterday  In Dr. Koren Bound d/w Dr. Shirlee Latch will look towards Monday for implant hopefully mind will be clearer   For questions or updates, please contact [facility] Please consult [email or web address] for contact info under        Signed, Sheilah Pigeon, PA-C  11/08/2022, 8:33 AM   (As above)  Persistent cardiomyopathy with OOH Cardiac arrest   needs ICD  timing to be determinted

## 2022-11-08 NOTE — Progress Notes (Signed)
Nutrition Follow-up  DOCUMENTATION CODES:   Morbid obesity  INTERVENTION:  Ensure Max po BID, each supplement provides 150 kcal and 30 grams of protein.  MVI  Education on adequate protein intake   NUTRITION DIAGNOSIS:   Impaired nutrient utilization related to altered GI function as evidenced by  (hx of bariatric surgery).   GOAL:   Patient will meet greater than or equal to 90% of their needs -ongoing  MONITOR:   I & O's, Labs, PO intake, Supplement acceptance, Weight trends  REASON FOR ASSESSMENT:   Ventilator, Consult Enteral/tube feeding initiation and management (recent bariatric surgery)  ASSESSMENT:   Pt with hx of GERD and SADI-S bariatric surgery (06/2022) presented to ED after becoming unresponsive at home. EMS arrived and found pt to be in cardiac arrest.   Labs: reviewed, Nutrition labs WDL  Meds: colace, folvite, lasix, insulin, miralax, klor-con, NS, aldactone, thiamine Wt: 23# (9%) wt loss since admission possibly related to fluid shifts   -admit wt: 262#   -CBW 239# PO: 70-75% meal intake x 2 documented meals  I/O's: -4.5 L  Visited patient who was on the phone with her significant other during visit.   TF's d/c'd on 4/1.   Patient reports she is eating well. RD observed she had eaten 25% of her pizza. Patient has been noted to have some memory issues 2/2 cardiac arrest.   She is agreeable to Ensure Max BID. RD instructed her to sip on it throughout the day. RD stressed optimizing protein intake.   Diet Order:   Diet Order             Diet NPO time specified Except for: Sips with Meds  Diet effective midnight           Diet Heart Room service appropriate? Yes; Fluid consistency: Thin  Diet effective now                   EDUCATION NEEDS:   Not appropriate for education at this time  Skin:  Skin Assessment: Reviewed RN Assessment  Last BM:  4/1 - type 7  Height:   Ht Readings from Last 1 Encounters:  11/06/22 5\' 6"  (1.676 m)     Weight:   Wt Readings from Last 1 Encounters:  11/08/22 108.5 kg    Ideal Body Weight:  59.1 kg  BMI:  Body mass index is 38.61 kg/m.  Estimated Nutritional Needs:   Kcal:  1700-2000 kcal/d  Protein:  90-120 g/d  Fluid:  >/=1.8 L/d    Trey Paula, RDN, LDN  Clinical Nutrition

## 2022-11-08 NOTE — Assessment & Plan Note (Signed)
Hgb A1c 5,1  Will discontinue insulin therapy and check CBG only as needed.

## 2022-11-08 NOTE — Progress Notes (Signed)
CARDIAC REHAB PHASE I   PRE:  Rate/Rhythm: 97 NSR  BP:  Sitting: 96/64      SaO2: RA  MODE:  Ambulation: 470 ft   AD:  None  POST:  Rate/Rhythm: 119 ST  BP:  Sitting: 113/65      SaO2: RA  Pt amb with standby assistance, pt denies CP and SOB during amb and was returned to room w/o complaint.  Christen Bame  1:28 PM 11/08/2022

## 2022-11-09 ENCOUNTER — Encounter (HOSPITAL_COMMUNITY)
Admission: EM | Disposition: A | Discharge status: Home / Self Care | Payer: Self-pay | Source: Home / Self Care | Attending: Internal Medicine

## 2022-11-09 ENCOUNTER — Other Ambulatory Visit (HOSPITAL_COMMUNITY): Payer: Self-pay

## 2022-11-09 DIAGNOSIS — R7303 Prediabetes: Secondary | ICD-10-CM

## 2022-11-09 DIAGNOSIS — G9341 Metabolic encephalopathy: Secondary | ICD-10-CM | POA: Diagnosis not present

## 2022-11-09 DIAGNOSIS — J9601 Acute respiratory failure with hypoxia: Secondary | ICD-10-CM | POA: Diagnosis not present

## 2022-11-09 DIAGNOSIS — E876 Hypokalemia: Secondary | ICD-10-CM | POA: Diagnosis not present

## 2022-11-09 DIAGNOSIS — I469 Cardiac arrest, cause unspecified: Secondary | ICD-10-CM

## 2022-11-09 HISTORY — PX: ICD IMPLANT: EP1208

## 2022-11-09 LAB — BASIC METABOLIC PANEL
Anion gap: 8 (ref 5–15)
BUN: 17 mg/dL (ref 6–20)
CO2: 28 mmol/L (ref 22–32)
Calcium: 9.9 mg/dL (ref 8.9–10.3)
Chloride: 103 mmol/L (ref 98–111)
Creatinine, Ser: 1.12 mg/dL — ABNORMAL HIGH (ref 0.44–1.00)
GFR, Estimated: 60 mL/min — ABNORMAL LOW (ref >60)
Glucose, Bld: 96 mg/dL (ref 70–99)
Potassium: 4.2 mmol/L (ref 3.5–5.1)
Sodium: 139 mmol/L (ref 135–145)

## 2022-11-09 LAB — SURGICAL PCR SCREEN
MRSA, PCR: NEGATIVE
Staphylococcus aureus: NEGATIVE

## 2022-11-09 SURGERY — ICD IMPLANT

## 2022-11-09 MED ORDER — CHLORHEXIDINE GLUCONATE 4 % EX LIQD
60.0000 mL | Freq: Once | CUTANEOUS | Status: DC
  Administered 2022-11-09: 4 via TOPICAL
  Filled 2022-11-09: qty 60

## 2022-11-09 MED ORDER — WHITE PETROLATUM EX OINT: TOPICAL_OINTMENT | CUTANEOUS | Status: DC | PRN

## 2022-11-09 MED ORDER — FENTANYL CITRATE (PF) 100 MCG/2ML IJ SOLN
INTRAMUSCULAR | Status: DC | PRN
  Administered 2022-11-09: 12.5 ug via INTRAVENOUS
  Administered 2022-11-09: 25 ug via INTRAVENOUS
  Administered 2022-11-09: 12.5 ug via INTRAVENOUS

## 2022-11-09 MED ORDER — FENTANYL CITRATE (PF) 100 MCG/2ML IJ SOLN
INTRAMUSCULAR | Status: AC
  Filled 2022-11-09: qty 2

## 2022-11-09 MED ORDER — DAPAGLIFLOZIN PROPANEDIOL 10 MG PO TABS
10.0000 mg | ORAL_TABLET | Freq: Every day | ORAL | 3 refills | Status: DC
  Filled 2022-11-09: qty 30, 30d supply, fill #0

## 2022-11-09 MED ORDER — ASPIRIN 81 MG PO CHEW
81.0000 mg | CHEWABLE_TABLET | Freq: Every day | ORAL | Status: DC
  Administered 2022-11-09 – 2022-11-10 (×2): 81 mg via ORAL
  Filled 2022-11-09 (×2): qty 1

## 2022-11-09 MED ORDER — ASPIRIN 81 MG PO CHEW
81.0000 mg | CHEWABLE_TABLET | Freq: Every day | ORAL | 3 refills | Status: DC
  Filled 2022-11-09: qty 30, 30d supply, fill #0

## 2022-11-09 MED ORDER — HEPARIN (PORCINE) IN NACL 1000-0.9 UT/500ML-% IV SOLN
INTRAVENOUS | Status: DC | PRN
  Administered 2022-11-09: 500 mL

## 2022-11-09 MED ORDER — FUROSEMIDE 40 MG PO TABS
40.0000 mg | ORAL_TABLET | Freq: Every day | ORAL | 3 refills | Status: DC
  Filled 2022-11-09: qty 30, 30d supply, fill #0

## 2022-11-09 MED ORDER — SODIUM CHLORIDE 0.9 % IV SOLN
INTRAVENOUS | Status: AC
  Filled 2022-11-09: qty 2

## 2022-11-09 MED ORDER — SODIUM CHLORIDE 0.9 % IV SOLN: INTRAVENOUS | Status: DC

## 2022-11-09 MED ORDER — MAGNESIUM SULFATE 2 GM/50ML IV SOLN
2.0000 g | Freq: Once | INTRAVENOUS | Status: AC
  Administered 2022-11-09: 2 g via INTRAVENOUS
  Filled 2022-11-09: qty 50

## 2022-11-09 MED ORDER — MIDAZOLAM HCL 5 MG/5ML IJ SOLN
INTRAMUSCULAR | Status: DC | PRN
  Administered 2022-11-09: .5 mg via INTRAVENOUS
  Administered 2022-11-09: 1 mg via INTRAVENOUS
  Administered 2022-11-09: .5 mg via INTRAVENOUS

## 2022-11-09 MED ORDER — VANCOMYCIN HCL 1500 MG/300ML IV SOLN
1500.0000 mg | INTRAVENOUS | Status: AC
  Administered 2022-11-09: 1500 mg via INTRAVENOUS
  Filled 2022-11-09: qty 300

## 2022-11-09 MED ORDER — MIDAZOLAM HCL 5 MG/5ML IJ SOLN
INTRAMUSCULAR | Status: AC
  Filled 2022-11-09: qty 5

## 2022-11-09 MED ORDER — VANCOMYCIN HCL IN DEXTROSE 1-5 GM/200ML-% IV SOLN
INTRAVENOUS | Status: AC
  Filled 2022-11-09: qty 200

## 2022-11-09 MED ORDER — SODIUM CHLORIDE 0.9% FLUSH
3.0000 mL | Freq: Two times a day (BID) | INTRAVENOUS | Status: DC
  Administered 2022-11-09: 3 mL via INTRAVENOUS

## 2022-11-09 MED ORDER — SODIUM CHLORIDE 0.9 % IV SOLN: 250.0000 mL | INTRAVENOUS | Status: DC

## 2022-11-09 MED ORDER — LIDOCAINE HCL (PF) 1 % IJ SOLN
INTRAMUSCULAR | Status: DC | PRN
  Administered 2022-11-09: 60 mL

## 2022-11-09 MED ORDER — SPIRONOLACTONE 25 MG PO TABS
25.0000 mg | ORAL_TABLET | Freq: Every day | ORAL | 3 refills | Status: DC
  Filled 2022-11-09: qty 3, 3d supply, fill #0

## 2022-11-09 MED ORDER — CARVEDILOL 3.125 MG PO TABS
3.1250 mg | ORAL_TABLET | Freq: Two times a day (BID) | ORAL | Status: DC
  Administered 2022-11-09 – 2022-11-10 (×2): 3.125 mg via ORAL
  Filled 2022-11-09 (×2): qty 1

## 2022-11-09 MED ORDER — SODIUM CHLORIDE 0.9% FLUSH: 3.0000 mL | INTRAVENOUS | Status: DC | PRN

## 2022-11-09 MED ORDER — VANCOMYCIN HCL IN DEXTROSE 1-5 GM/200ML-% IV SOLN: INTRAVENOUS | Status: DC | PRN

## 2022-11-09 MED ORDER — SODIUM CHLORIDE 0.9 % IV SOLN
80.0000 mg | INTRAVENOUS | Status: AC
  Administered 2022-11-09: 80 mg
  Filled 2022-11-09 (×3): qty 2

## 2022-11-09 MED ORDER — SACUBITRIL-VALSARTAN 49-51 MG PO TABS
1.0000 | ORAL_TABLET | Freq: Two times a day (BID) | ORAL | 3 refills | Status: DC
  Filled 2022-11-09: qty 60, 30d supply, fill #0

## 2022-11-09 MED ORDER — LIDOCAINE HCL (PF) 1 % IJ SOLN
INTRAMUSCULAR | Status: AC
  Filled 2022-11-09: qty 60

## 2022-11-09 SURGICAL SUPPLY — 7 items
CABLE SURGICAL S-101-97-12 (CABLE) ×1 IMPLANT
ICD ACTICOR DX (ICD Generator) IMPLANT
LEAD PLEXA 65/15 (Lead) IMPLANT
PAD DEFIB RADIO PHYSIO CONN (PAD) ×1 IMPLANT
SHEATH 8FR PRELUDE SNAP 13 (SHEATH) IMPLANT
SHEATH PROBE COVER 6X72 (BAG) IMPLANT
TRAY PACEMAKER INSERTION (PACKS) ×1 IMPLANT

## 2022-11-09 NOTE — Progress Notes (Signed)
PT Cancellation Note  Patient Details Name: Karen Lutz MRN: [id number] DOB: July 09, 1971   Cancelled Treatment:    Reason Eval/Treat Not Completed: Other (comment). Pt amb with cardiac rehab and going for ICD later today. Will continue to follow.   Angelina Ok [facility] 11/09/2022, 10:28 AM Skip Mayer PT Acute Rehabilitation Services Office [phone]

## 2022-11-09 NOTE — Evaluation (Signed)
Occupational Therapy Evaluation Patient Details Name: Karen Lutz MRN: [id number] DOB: 06-13-1971 Today's Date: 11/09/2022   History of Present Illness 52 yo female admitted 3/28. Pt found unresponsive at home in cardiac arrest requiring at least 23 minutes of CPR before ROSC. Pt hypokalemic UDS + for benzos. VDRF 3/28-3/30. PMH: obesity s/p gastric bypass sx   Clinical Impression   Pt admitted for above diagnosis and reports being independent at baseline for functional mobility and bADLs/iADLs. Pt currently functioning at or near baseline but HR increases with functional activity. Pt scheduled for R heart cath procedure, so they will remain on OT caseload to reevaluate functional performance and educate patient on post op precautions. Anticipate quick progression, no follow-up OT recommended at this time.      Recommendations for follow up therapy are one component of a multi-disciplinary discharge planning process, led by the attending physician.  Recommendations may be updated based on patient status, additional functional criteria and insurance authorization.   Assistance Recommended at Discharge PRN  Patient can return home with the following Assistance with cooking/housework    Functional Status Assessment  Patient has not had a recent decline in their functional status  Equipment Recommendations  None recommended by OT    Recommendations for Other Services       Precautions / Restrictions Precautions Precautions: Fall Precaution Comments: [facility] precautions reviewed, patient is schedule for procedure 11/09/22 Restrictions Weight Bearing Restrictions: No Other Position/Activity Restrictions:  (post RHC RUE no pushing/pulling >5 lbs for 5 days)      Mobility Bed Mobility               General bed mobility comments: Pt rec'd and left sitting in chair    Transfers Overall transfer level: Independent Equipment used: None                      Balance  Overall balance assessment: Mild deficits observed, not formally tested                                         ADL either performed or assessed with clinical judgement   ADL Overall ADL's : Modified independent                                       General ADL Comments: Pt completing bADLs, lower body dressing, and functional ambulation with Mod I (gait is just typically slower paced than normal)     Vision Baseline Vision/History: 1 Wears glasses Ocular Range of Motion: Within Functional Limits Tracking/Visual Pursuits: Able to track stimulus in all quads without difficulty Visual Fields: No apparent deficits Additional Comments: No apparent visual deficit from testing     Perception     Praxis      Pertinent Vitals/Pain Pain Assessment Pain Assessment: No/denies pain     Hand Dominance Right   Extremity/Trunk Assessment Upper Extremity Assessment Upper Extremity Assessment: Overall WFL for tasks assessed   Lower Extremity Assessment Lower Extremity Assessment: Overall WFL for tasks assessed   Cervical / Trunk Assessment Cervical / Trunk Assessment: Normal   Communication Communication Communication: No difficulties   Cognition Arousal/Alertness: Awake/alert Behavior During Therapy: WFL for tasks assessed/performed Overall Cognitive Status: Within Functional Limits for tasks assessed  General Comments  HR to 122 bpm with functional ambulation. Pt denies any signs of exertion    Exercises     Shoulder Instructions      Home Living Family/patient expects to be discharged to:: Private residence Living Arrangements: Alone Available Help at Discharge: Family;Available 24 hours/day (can go stay with dautghter with 3 steps into house) Type of Home: Apartment (2nd floor) Home Access: Stairs to enter Entrance Stairs-Number of Steps: 19 Entrance Stairs-Rails: Right Home  Layout: One level     Bathroom Shower/Tub: Producer, television/film/video: Standard     Home Equipment: None   Additional Comments: Pt works at [employer] and for [employer]      Prior Functioning/Environment Prior Level of Function : Independent/Modified Independent;Driving;Working/employed             Mobility Comments: Independent ADLs Comments: Independent        OT Problem List: Impaired balance (sitting and/or standing)      OT Treatment/Interventions:      OT Goals(Current goals can be found in the care plan section) Acute Rehab OT Goals Patient Stated Goal: To go home OT Goal Formulation: With patient Time For Goal Achievement: 11/23/22 Potential to Achieve Goals: Good  OT Frequency:      Co-evaluation              AM-PAC OT "6 Clicks" Daily Activity     Outcome Measure Help from another person eating meals?: None Help from another person taking care of personal grooming?: None Help from another person toileting, which includes using toliet, bedpan, or urinal?: None Help from another person bathing (including washing, rinsing, drying)?: None Help from another person to put on and taking off regular upper body clothing?: None Help from another person to put on and taking off regular lower body clothing?: None 6 Click Score: 24   End of Session Equipment Utilized During Treatment: Gait belt Nurse Communication: Mobility status  Activity Tolerance: Patient tolerated treatment well Patient left: in chair;with call bell/phone within reach  OT Visit Diagnosis: Unsteadiness on feet (R26.81)                Time: 6808-8110 OT Time Calculation (min): 14 min Charges:  OT General Charges $OT Visit: 1 Visit OT Evaluation $OT Eval Moderate Complexity: 1 Mod  11/09/2022  AB, OTR/L  Acute Rehabilitation Services  Office: [phone]   Tristan Schroeder 11/09/2022, 2:36 PM

## 2022-11-09 NOTE — Discharge Instructions (Signed)
     Supplemental Discharge Instructions for  Pacemaker/Defibrillator Patients   Activity No heavy lifting or vigorous activity with your left/right arm for 6 to 8 weeks.  Do not raise your left/right arm above your head for one week.  Gradually raise your affected arm as drawn below.             11/14/22                    11/15/22                     11/16/22                   11/17/22 __  NO DRIVING for 6 months.  WOUND CARE Keep the wound area clean and dry.  Do not get this area wet , no showers for one week; you may shower on   11/17/22  . The tape/steri-strips on your wound will fall off; do not pull them off.  No bandage is needed on the site.  DO  NOT apply any creams, oils, or ointments to the wound area. If you notice any drainage or discharge from the wound, any swelling or bruising at the site, or you develop a fever > 101? F after you are discharged home, call the office at once.  Special Instructions You are still able to use cellular telephones; use the ear opposite the side where you have your pacemaker/defibrillator.  Avoid carrying your cellular phone near your device. When traveling through airports, show security personnel your identification card to avoid being screened in the metal detectors.  Ask the security personnel to use the hand wand. Avoid arc welding equipment, MRI testing (magnetic resonance imaging), TENS units (transcutaneous nerve stimulators).  Call the office for questions about other devices. Avoid electrical appliances that are in poor condition or are not properly grounded. Microwave ovens are safe to be near or to operate.  Additional information for defibrillator patients should your device go off: If your device goes off ONCE and you feel fine afterward, notify the device clinic nurses. If your device goes off ONCE and you do not feel well afterward, call 911. If your device goes off TWICE, call 911. If your device goes off THREE times in one  day, call 911.  DO NOT DRIVE YOURSELF OR A FAMILY MEMBER WITH A DEFIBRILLATOR TO THE HOSPITAL--CALL 911.

## 2022-11-09 NOTE — Progress Notes (Signed)
Medications have been on hold d/t patient not wanting to take medications in empty stomach. Will resume after the procedure.

## 2022-11-09 NOTE — Progress Notes (Signed)
Progress Note   Patient: Karen Lutz YOO:[id number] DOB: 10/29/1970 DOA: 11/01/2022     8 DOS: the patient was seen and examined on 11/09/2022   Brief hospital course: Mrs. Manalili was admitted to the hospital after a cardiac arrest, presumed to be due to hypokalemia, qt prolongation.   52 yo female with the past medical history of obesity sp bariatric surgery who was found down unresponsive. EMS was called and she was in ventricular fibrillation. Patient had prompt defibrillation, converting to PEA and VT. Recurrent VF, underwent second shock and converting to SVT. Patient was placed on amiodarone and epinephrine infusions. After 23 minutes of CPR she recovered spontaneous circulation. She was admitted to the intensive care unit on invasive mechanical ventilation. Blood pressure on admission to the ICU 102/70, HR 95, RR 26 and 02 saturation 100%. Lungs with coarse breath sounds, heart with S1 and S2 present and rhythmic, abdomen not distended and no lower extremity edema.   Patient was placed non norepinephrine infusion for shock.  Echocardiogram with reduced LV systolic function and diffuse wall motion abnormalities.  Antibiotic therapy for possible aspiration pneumonia.   03/31 liberated from mechanical ventilation.  Off vasopressors.  04/10 Placed ARB and spironolactone.  04/02 Transferred to TR[facility]. Right and left cardiac catheterization with normal coronaries, and elevated filling pressures. Preserved cardiac output.  04/03 Cardiac MRI, consulted EP for secondary prevention ICD.  04/05 ICD implantation today.     Assessment and Plan: * Cardiac arrest Cardiogenic shock Ventricular fibrillation.  Acute on chronic systolic heart failure.   Echocardiogram with reduced LV systolic function with EF 25%, global hypokinesis, severe dilatated LV cavity, no LVH, RV systolic function preserved. RVSP 34,6. LA with moderate dilatation. No pericardial effusion. No significant valvular disease.    04/02 cardiac catheterization PA 47/28 mean 36 PCWP mean 26 PVR 1.5  Cardiac output 6.4 and index 2.9 per Fick.   Post capillary pulmonary hypertension.  Coronary angiography with normal coronaries.   Plan to continue diuresis with furosemide, spironolactone and dapagliflozin.  Entresto, holding on B blockade  Plan for ICD implantation for secondary prophylaxis V fib and V tach.   Hypokalemia Stable renal functio with serum cr at 1,12 with K at 4,2 and serum bicarbonate at 28. Plan to continue diuresis.   Acute respiratory failure with hypoxia Respiratory failure has improved with diuresis. Her 02 saturation is 96% on room air.   She has been treated for aspiration pneumonia with antibiotic therapy with good toleration,.   Acute metabolic encephalopathy Post cardiac arrest encephalopathy. Clinically resolving Continue neuro checks per unit protocol. PT and OT  Class 2 obesity Calculated BMI is 38.6   Prediabetes Hgb A1c 5,1  Will discontinue insulin therapy and check CBG only as needed.         Subjective: Patient is feeling better, no chest pain, no dyspnea, no PND or orthopnea.   Physical Exam: Vitals:   11/08/22 1617 11/08/22 1955 11/09/22 0026 11/09/22 0418  BP: 102/66 97/73 100/68 104/65  Pulse: 94 100 90 98  Resp: 17 20 18 16   Temp: 98 F (36.7 C) 98 F (36.7 C) 98 F (36.7 C) 98 F (36.7 C)  TempSrc: Oral Oral Oral Oral  SpO2: 97% 97% 97% 97%  Weight:    108.8 kg  Height:       Neurology awake and alert ENT with mild pallor Cardiovascular with S1 and S2 present and rhythmic with no gallops, rubs or murmurs Respiratory with no rales or wheezing  Abdomen with no distention  No lower extremity edema  Data Reviewed:    Family Communication: no family at the bedside   Disposition: Status is: Inpatient Remains inpatient appropriate because: pending ICD implantation   Planned Discharge Destination: Home     Author: Coralie Keens, MD 11/09/2022 12:27 PM  For on call review [email or web address].

## 2022-11-09 NOTE — TOC Progression Note (Signed)
Discharge medications (5) are being stored in the main pharmacy on the ground floor until patient is ready for discharge.   

## 2022-11-09 NOTE — Progress Notes (Addendum)
Advanced Heart Failure Rounding Note  PCP-Cardiologist: None   Subjective:    Weight stable.   SCr downtrending 0.98>>1.20>1.18>1.12. SBP 100s  cMRI 4/3: EF 22%, normal RV size RV EF 50%, No myocardial LGE, so no definitive evidence for prior MI, myocarditis, or infiltrative disease  Feels fine this morning, sitting on EOB. Denies CP/SOB. CVP not hooked up.   LHC - normal cors  Right Heart Pressures RHC Procedural Findings: Hemodynamics (mmHg) RA mean 8 RV 46/10 PA 47/28, mean 36 PCWP mean 26 LV 130/28 AO 132/78  Oxygen saturations: PA 66% AO 96%  Cardiac Output (Fick) 6.45  Cardiac Index (Fick) 2.94 PVR 1.55 WU    Objective:   Weight Range: 108.8 kg Body mass index is 38.7 kg/m.   Vital Signs:   Temp:  [98 F (36.7 C)-98.3 F (36.8 C)] 98 F (36.7 C) (04/05 0418) Pulse Rate:  [90-100] 98 (04/05 0418) Resp:  [16-20] 16 (04/05 0418) BP: (96-104)/(64-73) 104/65 (04/05 0418) SpO2:  [93 %-97 %] 97 % (04/05 0418) Weight:  [108.8 kg] 108.8 kg (04/05 0418) Last BM Date : 11/06/22  Weight change: Filed Weights   11/07/22 0402 11/08/22 0530 11/09/22 0418  Weight: 108.6 kg 108.5 kg 108.8 kg   Intake/Output:  No intake or output data in the 24 hours ending 11/09/22 1021   Physical Exam   General:  well appearing.  No respiratory difficulty HEENT: normal Neck: supple. JVD ~6 cm. Carotids 2+ bilat; no bruits. No lymphadenopathy or thyromegaly appreciated. Cor: PMI nondisplaced. Regular rate & rhythm. No rubs, gallops or murmurs. Lungs: clear Abdomen: soft, nontender, nondistended. No hepatosplenomegaly. No bruits or masses. Good bowel sounds. Extremities: no cyanosis, clubbing, rash, non-pitting BLE edema  Neuro: alert & oriented x 3, cranial nerves grossly intact. moves all 4 extremities w/o difficulty. Affect pleasant.   Telemetry   NSR 90s, brief 6 beat NSVT (Personally reviewed)    EKG    No new EKG to review   Labs    CBC No results for  input(s): "WBC", "NEUTROABS", "HGB", "HCT", "MCV", "PLT" in the last 72 hours.  Basic Metabolic Panel Recent Labs    75/88/32 0401 11/09/22 0443  NA 139 139  K 3.5 4.2  CL 98 103  CO2 28 28  GLUCOSE 84 96  BUN 18 17  CREATININE 1.18* 1.12*  CALCIUM 10.1 9.9  MG 1.9  --    Liver Function Tests No results for input(s): "AST", "ALT", "ALKPHOS", "BILITOT", "PROT", "ALBUMIN" in the last 72 hours.  No results for input(s): "LIPASE", "AMYLASE" in the last 72 hours. Cardiac Enzymes No results for input(s): "CKTOTAL", "CKMB", "CKMBINDEX", "TROPONINI" in the last 72 hours.  BNP: BNP (last 3 results) No results for input(s): "BNP" in the last 8760 hours.  ProBNP (last 3 results) No results for input(s): "PROBNP" in the last 8760 hours.   D-Dimer No results for input(s): "DDIMER" in the last 72 hours. Hemoglobin A1C No results for input(s): "HGBA1C" in the last 72 hours. Fasting Lipid Panel No results for input(s): "CHOL", "HDL", "LDLCALC", "TRIG", "CHOLHDL", "LDLDIRECT" in the last 72 hours. Thyroid Function Tests No results for input(s): "TSH", "T4TOTAL", "T3FREE", "THYROIDAB" in the last 72 hours.  Invalid input(s): "FREET3"  Other results:  Imaging    No results found.   Medications:     Scheduled Medications:  aspirin  81 mg Per Tube Daily   chlorhexidine  60 mL Topical Once   chlorhexidine  60 mL Topical Once  Chlorhexidine Gluconate Cloth  6 each Topical Q0600   dapagliflozin propanediol  10 mg Oral Daily   docusate  100 mg Per Tube BID   folic acid  1 mg Oral Daily   furosemide  40 mg Oral Daily   gentamicin (GARAMYCIN) 80 mg in sodium chloride 0.9 % 500 mL irrigation  80 mg Irrigation On Call   multivitamin with minerals  1 tablet Oral Daily   polyethylene glycol  17 g Per Tube Daily   Ensure Max Protein  11 oz Oral BID   sacubitril-valsartan  1 tablet Oral BID   sodium chloride flush  10-40 mL Intracatheter Q12H   sodium chloride flush  3 mL  Intravenous Q12H   sodium chloride flush  3 mL Intravenous Q12H   sodium chloride flush  3 mL Intravenous Q12H   spironolactone  25 mg Oral Daily   thiamine  100 mg Oral Daily    Infusions:  sodium chloride     sodium chloride     sodium chloride     vancomycin      PRN Medications: sodium chloride, acetaminophen **OR** acetaminophen (TYLENOL) oral liquid 160 mg/5 mL **OR** acetaminophen, acetaminophen, albuterol, mouth rinse, prochlorperazine, sodium chloride flush, sodium chloride flush, sodium chloride flush  Patient Profile   52 y/o [name] w/ h/o obesity s/p bariatric surgery 11/23 at [facility], reported FH of premature CAD (father MI in 17s) and nd FH of CHF (2 sisters). Had Influenza A 12/23. Admitted w/ OOH VF arrest.   Assessment/Plan   1. VF arrest: Out of hospital. She was hypokalemic initially with QTc 511 msec.  F/u ECG post correction of hypokalemia showed normal QTc.  Think she will need secondary prevention ICD. She also  - EP following, plan for ICD today - Keep K >4 and Mg >2  2. Acute systolic CHF: Echo with EF 25%, diffuse hypokinesis, normal RV.  Cause uncertain.  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  No CAD on cath.  Filling pressures remain elevated on cath with preserved cardiac output. Cardiac MRI with LV EF 22%, RV EF 50%, no myocardial LGE.  - Continue Spironolactone 25 mg daily . - Continue Entresto 49/51 mg bid  - Continue Farxiga 10 mg daily. - Now on 40 PO lasix daily. States more UOP than what's documented.  - cMRI 4/3: EF 22%, normal RV size RV EF 50%, No myocardial LGE, so no definitive evidence for prior MI, myocarditis, or infiltrative disease  3. Neuro: Still with some confusion post-arrest.  Improving.   Length of Stay: 8  Karen Bleacher, Karen Lutz  11/09/2022, 10:21 AM  Advanced Heart Failure Team Pager [phone] (M-F; 7a - 5p)  Please contact [facility] for night-coverage after hours (5p -7a ) and weekends on  [email or web address]  10:21 AM  Patient seen with Karen Lutz, agree with the above note.   Patient had Biotronik ICD placed today.  No dyspnea.   General: NAD Neck: No JVD, no thyromegaly or thyroid nodule.  Lungs: Clear to auscultation bilaterally with normal respiratory effort. CV: Nondisplaced PMI.  Heart regular S1/S2, no S3/S4, no murmur.  No peripheral edema.   Abdomen: Soft, nontender, no hepatosplenomegaly, no distention.  Skin: Intact without lesions or rashes.  Neurologic: Alert and oriented x 3.  Psych: Normal affect. Extremities: No clubbing or cyanosis.  HEENT: Normal.   Continue current GDMT and will add Coreg 3.125 mg bid.   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  Would aim for home tomorrow, follow CHF clinic and will need [employer] gene testing as outpatient.   Karen Lutz 11/09/2022 4:02 PM

## 2022-11-09 NOTE — Progress Notes (Addendum)
Rounding Note    Patient Name: Karen Lutz Date of Encounter: 11/08/2022  [facility] Cardiologist: None prior  Subjective   No complaints this morning, feels well, hopes to go home soon  Inpatient Medications    Scheduled Meds:  aspirin  81 mg Per Tube Daily   Chlorhexidine Gluconate Cloth  6 each Topical Q0600   dapagliflozin propanediol  10 mg Oral Daily   docusate  100 mg Per Tube BID   folic acid  1 mg Oral Daily   insulin aspart  0-15 Units Subcutaneous Q4H   polyethylene glycol  17 g Per Tube Daily   sacubitril-valsartan  1 tablet Oral BID   sodium chloride flush  10-40 mL Intracatheter Q12H   sodium chloride flush  3 mL Intravenous Q12H   sodium chloride flush  3 mL Intravenous Q12H   spironolactone  25 mg Oral Daily   thiamine  100 mg Oral Daily   Continuous Infusions:  sodium chloride     PRN Meds: sodium chloride, acetaminophen **OR** acetaminophen (TYLENOL) oral liquid 160 mg/5 mL **OR** acetaminophen, acetaminophen, albuterol, mouth rinse, prochlorperazine, sodium chloride flush, sodium chloride flush   Vital Signs    Vitals:   11/08/22 0341 11/08/22 0530 11/08/22 0721 11/08/22 0829  BP: 125/82  124/88   Pulse: 96  90 90  Resp: 18  17   Temp: 98.5 F (36.9 C)  98.1 F (36.7 C)   TempSrc: Oral  Oral   SpO2: 99%  99% (!) 88%  Weight:  108.5 kg    Height:        Intake/Output Summary (Last 24 hours) at 11/08/2022 0833 Last data filed at 11/08/2022 0343 Gross per 24 hour  Intake --  Output 300 ml  Net -300 ml      11/08/2022    5:30 AM 11/07/2022    4:02 AM 11/06/2022    6:45 PM  Last 3 Weights  Weight (lbs) 239 lb 3.2 oz 239 lb 6.4 oz 253 lb 8.5 oz  Weight (kg) 108.5 kg 108.591 kg 115 kg      Telemetry    Unchanged, SR/ST brief SVT, intermittent short PR rhythm continues, no VT - Personally Reviewed  ECG    No new EKGs - Personally Reviewed  Physical Exam   GEN: No acute distress.   Neck: No JVD Cardiac: RRR, no  murmurs, rubs, or gallops.  Respiratory: CTA b/l. GI: Soft, nontender, non-distended  MS: No edema; No deformity. Neuro:  Nonfocal, AAO x4 today Psych: Normal affect   Labs    High Sensitivity Troponin:   Recent Labs  Lab 11/01/22 0250 11/01/22 0440 11/01/22 1213  TROPONINIHS 27* 99* 178*     Chemistry Recent Labs  Lab 11/03/22 0400 11/04/22 0410 11/05/22 0430 11/06/22 0209 11/06/22 0839 11/07/22 0642 11/08/22 0401  NA 137 140 140 139 143  143 139 139  K 3.4* 3.8 3.5 3.6 3.9  3.8 4.1 3.5  CL 107 108 105 106  --  102 98  CO2 22 24 25 24   --  29 28  GLUCOSE 161* 116* 105* 86  --  92 84  BUN 18 20 17 12   --  15 18  CREATININE 1.33* 1.20* 1.02* 0.98  --  1.20* 1.18*  CALCIUM 9.2 9.0 9.3 9.4  --  10.0 10.1  MG 2.1 1.8 2.4 2.1  --   --  1.9  PROT 5.3* 5.6* 5.8*  --   --   --   --  ALBUMIN 2.4* 2.5* 2.5*  --   --   --   --   AST 23 16 14*  --   --   --   --   ALT 32 23 17  --   --   --   --   ALKPHOS 83 79 74  --   --   --   --   BILITOT 0.5 0.6 0.5  --   --   --   --   GFRNONAA 49* 55* >60 >60  --  55* 56*  ANIONGAP 8 8 10 9   --  8 13    Lipids No results for input(s): "CHOL", "TRIG", "HDL", "LABVLDL", "LDLCALC", "CHOLHDL" in the last 168 hours.  Hematology Recent Labs  Lab 11/04/22 0410 11/05/22 0430 11/06/22 0209 11/06/22 0839  WBC 5.7 4.7 5.0  --   RBC 4.13 4.06 4.13  --   HGB 11.4* 11.0* 11.1* 11.6*  11.2*  HCT 34.3* 35.1* 36.2 34.0*  33.0*  MCV 83.1 86.5 87.7  --   MCH 27.6 27.1 26.9  --   MCHC 33.2 31.3 30.7  --   RDW 15.5 15.6* 15.4  --   PLT 135* 135* 142*  --    Thyroid  Recent Labs  Lab 11/06/22 0209  TSH 2.716    BNPNo results for input(s): "BNP", "PROBNP" in the last 168 hours.  DDimer No results for input(s): "DDIMER" in the last 168 hours.   Radiology      Cardiac Studies   11/07/22: c.MRI IMPRESSION: 1.  Moderately dilated LV with global hypokinesis, EF 22%. 2.  Normal RV size with EF 50%. 3. No myocardial LGE, so no  definitive evidence for prior MI, myocarditis, or infiltrative disease.  11/06/22: R/LHC 1. Normal coronaries.  2. Elevated filling pressures.  3. Mild pulmonary venous hypertension.  4. Preserved cardiac output.  RA mean 8 RV 46/10 PA 47/28, mean 36 PCWP mean 26 LV 130/28 AO 132/78  Oxygen saturations: PA 66% AO 96%  Cardiac Output (Fick) 6.45  Cardiac Index (Fick) 2.94 PVR 1.55 WU    11/01/22: 11/01/22 1. Global hypokinesis worse in septum and apex. Left ventricular ejection  fraction, by estimation, is 25%. The left ventricle has normal function.  The left ventricle demonstrates global hypokinesis. The left ventricular  internal cavity size was severely   dilated. Left ventricular diastolic parameters are consistent with Grade  II diastolic dysfunction (pseudonormalization). Elevated left ventricular  end-diastolic pressure.   2. Right ventricular systolic function is normal. The right ventricular  size is normal. There is normal pulmonary artery systolic pressure.   3. Left atrial size was moderately dilated.   4. The mitral valve is abnormal. Mild mitral valve regurgitation. No  evidence of mitral stenosis.   5. The aortic valve is tricuspid. Aortic valve regurgitation is not  visualized. No aortic stenosis is present.   6. The inferior vena cava is normal in size with greater than 50%  respiratory variability, suggesting right atrial pressure of 3 mmHg.     Patient Profile     52 y.o. female with a hx of bariatric surgery (DUODENAL SWITCH LAPAROSCOPIC on 06/06/22), GERD admitted with cardiac arrest  Assessment & Plan    OOH cardiac arrest Suspect NICM is not new post arrest and likely etiology of her event  Short PR, likely a pathway, suggesting an atrial fascicular or atrial nodal bypass tract  No further NSVTs Did have brief SVT Occ PVCs  I have discussed  [place] driving restriction of 6 mo, she is obviously not happy but understands the rational for it    3.   NICM  4.   She will need a secondary prevention ICD Planned for today Dr. Lalla Brothers has seen and examined her, discussed rational for implant, the procedure, potential risks and benefits She is agreeable Orders written Will plan Biotronik device  Post arrest hypoxic encephalopathy Much improved She was able to independently recall that she is planned for ICD implant AAO x4, has now as well recalled her personal passwords.    For questions or updates, please contact [facility] Please consult [email or web address] for contact info under        Signed, Sheilah Pigeon, PA-C  11/08/2022, 8:33 AM

## 2022-11-09 NOTE — TOC Initial Note (Signed)
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 States she did not need walker or bedside commode for home. Family will provide transportation.   Will need note for work. Pt has a scale at home or can get a scale to do daily weight. Educated pt on importance of daily weights. Meds in [facility] pharmacy for dc this weekend.,   Expected Discharge Plan: Home/Self Care Barriers to Discharge: No Barriers Identified   Patient Goals and CMS Choice Patient states their goals for this hospitalization and ongoing recovery are:: wants to get better          Expected Discharge Plan and Services   Discharge Planning Services: CM Consult   Living arrangements for the past 2 months: Apartment                                      Prior Living Arrangements/Services Living arrangements for the past 2 months: Apartment Lives with:: Adult Children Patient language and need for interpreter reviewed:: Yes Do you feel safe going back to the place where you live?: Yes      Need for Family Participation in Patient Care: No (Comment) Care giver support system in place?: Yes (comment)   Criminal Activity/Legal Involvement Pertinent to Current Situation/Hospitalization: No - Comment as needed  Activities of Daily Living      Permission Sought/Granted Permission sought to share information with : Case Manager, Family Supports, PCP Permission granted to share information with : Yes, Verbal Permission Granted  Share Information with NAME: Lenell Antu     Permission granted to share info w Relationship: sister  Permission granted to share info w Contact Information:  [phone]  Emotional Assessment Appearance:: Appears stated age Attitude/Demeanor/Rapport: Engaged Affect (typically observed): Accepting Orientation: : Oriented to Self, Oriented to Place, Oriented to  Time, Oriented to Situation   Psych Involvement: No (comment)  Admission diagnosis:  Cardiac arrest [I46.9] Cardiopulmonary arrest [I46.9] Patient Active Problem List   Diagnosis Date Noted   Prediabetes 11/08/2022   Class 2 obesity 11/07/2022   Acute metabolic encephalopathy 11/07/2022   Acute on chronic combined systolic and diastolic CHF (congestive heart failure) 11/05/2022   Acute HFrEF (heart failure with reduced ejection fraction) 11/05/2022   Hyperglycemia 11/05/2022   Prolonged Q-T interval on ECG 11/05/2022   VF (ventricular fibrillation) 11/05/2022   Cardiopulmonary arrest 11/05/2022   Cardiac arrest 11/01/2022   Hypokalemia 11/01/2022   Acute respiratory failure with hypoxia 11/01/2022   Cardiogenic shock 11/01/2022   Ventricular fibrillation 11/01/2022   Visit for wound check 10/03/2020   OA (osteoarthritis) of knee 05/09/2020   Epigastric pain 07/03/2013   Anemia 07/03/2013   Migraine 07/03/2013   Mood disorder 02/15/2011   Iron deficiency anemia 02/15/2011   Uterine fibroid 02/15/2011   GERD (gastroesophageal reflux disease) 02/15/2011   PCP:  Verlee Rossetti, PA-C Pharmacy:   [id number] - [place], [place] - [address] AT [facility] [address]  [address] [place] [address] Phone: [phone] Fax: [phone]  [facility] [address] [place] [address] Phone: [phone] Fax: [phone]     Social Determinants of Health (SDOH) Social History: SDOH Screenings   Food Insecurity: No Food Insecurity (11/02/2022)  Housing: Low Risk  (11/02/2022)  Transportation Needs: No Transportation Needs (11/02/2022)  Utilities: Not At Risk (11/02/2022)  Financial  Resource Strain: Low Risk  (11/02/2022)  Tobacco Use: Low Risk  (11/06/2022)   SDOH Interventions: Food Insecurity Interventions: Intervention Not Indicated Transportation Interventions: Intervention Not Indicated Utilities Interventions: Intervention Not Indicated Financial Strain Interventions: Intervention Not Indicated   Readmission Risk Interventions     No data to display

## 2022-11-09 NOTE — Progress Notes (Signed)
CARDIAC REHAB PHASE I   PRE:  Rate/Rhythm: 92 SR with 6 bt NSVT    BP: sitting 109/63    SpO2: 97 RA  MODE:  Ambulation: 270 ft   POST:  Rate/Rhythm: 116 ST    BP: sitting 98/70     SpO2: 97 RA  Pt ambulated slowly in hall. No major c/o. X1 LOB with min assist to correct.  Reviewed low sodium, daily wts. Pt unable to recall numbers until I stated them. Has materials. [id number]  Karen Lutz BS, ACSM-CEP 11/09/2022 10:24 AM

## 2022-11-10 ENCOUNTER — Inpatient Hospital Stay (HOSPITAL_COMMUNITY): Payer: BLUE CROSS/BLUE SHIELD

## 2022-11-10 DIAGNOSIS — G9341 Metabolic encephalopathy: Secondary | ICD-10-CM | POA: Diagnosis not present

## 2022-11-10 DIAGNOSIS — E876 Hypokalemia: Secondary | ICD-10-CM | POA: Diagnosis not present

## 2022-11-10 DIAGNOSIS — I469 Cardiac arrest, cause unspecified: Secondary | ICD-10-CM | POA: Diagnosis not present

## 2022-11-10 DIAGNOSIS — J9601 Acute respiratory failure with hypoxia: Secondary | ICD-10-CM | POA: Diagnosis not present

## 2022-11-10 LAB — BASIC METABOLIC PANEL
Anion gap: 11 (ref 5–15)
BUN: 16 mg/dL (ref 6–20)
CO2: 25 mmol/L (ref 22–32)
Calcium: 9.9 mg/dL (ref 8.9–10.3)
Chloride: 100 mmol/L (ref 98–111)
Creatinine, Ser: 1.19 mg/dL — ABNORMAL HIGH (ref 0.44–1.00)
GFR, Estimated: 56 mL/min — ABNORMAL LOW (ref >60)
Glucose, Bld: 102 mg/dL — ABNORMAL HIGH (ref 70–99)
Potassium: 4.2 mmol/L (ref 3.5–5.1)
Sodium: 136 mmol/L (ref 135–145)

## 2022-11-10 MED ORDER — CARVEDILOL 3.125 MG PO TABS: 3.1250 mg | ORAL_TABLET | Freq: Two times a day (BID) | ORAL | 0 refills | Status: DC

## 2022-11-10 NOTE — Discharge Summary (Signed)
Physician Discharge Summary   Patient: Karen Lutz MRN: [id number] DOB: 31-Jan-1971  Admit date:     11/01/2022  Discharge date: 11/10/22  Discharge Physician: York Ram Nylah Butkus   PCP: Verlee Rossetti, PA-C   Recommendations at discharge:    Patient has been placed on guideline directed medical therapy for heart failure with reduced systolic function. Diuresis with furosemide 40 mg daily. Follow up renal function and electrolytes in 7 days as outpatient.  SP ICD Biotronik implantation for secondary prophylaxis.  Follow up with heart failure clinic as outpatient, will need [employer] gene testing.  Follow up with Raenette Rover PA-C in 7 to 10 days.   Discharge Diagnoses: Principal Problem:   Cardiac arrest Active Problems:   Hypokalemia   Acute respiratory failure with hypoxia   Acute metabolic encephalopathy   Class 2 obesity   Prediabetes  Resolved Problems:   * No resolved hospital problems. [facility] Course: Karen Lutz was admitted to the hospital after a cardiac arrest, presumed to be due to hypokalemia, qt prolongation.   52 yo female with the past medical history of obesity sp bariatric surgery who was found down unresponsive. EMS was called and she was in ventricular fibrillation. Patient had prompt defibrillation, converting to PEA and VT. Recurrent VF, underwent second shock and converting to SVT. Patient was placed on amiodarone and epinephrine infusions. After 23 minutes of CPR she recovered spontaneous circulation. She was admitted to the intensive care unit on invasive mechanical ventilation. Blood pressure (on admission to the ICU) 102/70, HR 95, RR 26 and 02 saturation 100%. Lungs with coarse breath sounds, heart with S1 and S2 present and rhythmic, abdomen not distended and no lower extremity edema.   Na 140, K 2,9 Cl 106 bicarbonate 24, glucose 174, bun 9 cr 0,94  AST 209, ALT 60  High sensitive troponin 27, 99, 178   Wbc 14, hgb 12.1 plt 232  Head  CT with no acute changes.  Chest radiograph with cardiomegaly and bilateral hilar vascular congestion with no effusions.   EKG 98 bpm, normal axis, qtc 511, sinus rhythm with no significant ST segment or T wave changes.   Patient was placed non norepinephrine infusion for shock.  Echocardiogram with reduced LV systolic function and diffuse wall motion abnormalities.  Antibiotic therapy for possible aspiration pneumonia.   03/31 liberated from mechanical ventilation.  Off vasopressors.  04/10 Placed ARB and spironolactone.  04/02 Transferred to [facility]. Right and left cardiac catheterization with normal coronaries, and elevated filling pressures. Preserved cardiac output.  04/03 Cardiac MRI with no infiltrative process, consulted EP for secondary prevention ICD.  04/05 ICD implantation today.  04/06 patient medically stable for discharge with plan to follow up as outpatient.   Assessment and Plan: * Cardiac arrest Cardiogenic shock Ventricular fibrillation.  Acute on chronic systolic heart failure.   Echocardiogram with reduced LV systolic function with EF 25%, global hypokinesis, severe dilatated LV cavity, no LVH, RV systolic function preserved. RVSP 34,6. LA with moderate dilatation. No pericardial effusion. No significant valvular disease.   04/02 cardiac catheterization PA 47/28 mean 36 PCWP mean 26 PVR 1.5  Cardiac output 6.4 and index 2.9 per Fick.   Post capillary pulmonary hypertension.  Coronary angiography with normal coronaries.   Cardiac MRI with moderately dilatation of LV with global hypokinesis, EF 22%, no myocardial LGE, no definite evidence of prior MI, myocarditis or infiltrative disease.   Plan to continue diuresis with furosemide, spironolactone and dapagliflozin.  Sherryll Burger, and  carvedilol.  sp ICD implantation for secondary prophylaxis V fib and V tach.   Plan to follow up with cardiology as outpatient.   Hypokalemia Renal function remained  stable. Electrolytes were corrected. Patient has been placed on diuretic therapy.  At the time of her discharge her serum cr is 1,19 with K at 4,2 and serum bicarbonate at 25. Na is 136 and Mg 1,9  Plan to continue diuretic therapy and follow up with renal function electrolytes as outpatient in 7 days.   Acute respiratory failure with hypoxia Respiratory failure has improved with diuresis. Her 02 saturation is 96% on room air.   She has been treated for aspiration pneumonia with antibiotic therapy with good toleration,.   Acute metabolic encephalopathy Post cardiac arrest encephalopathy. Clinically resolving Continue neuro checks per unit protocol. PT and OT  Class 2 obesity Calculated BMI is 38.6   Prediabetes Hgb A1c 5,1  Will discontinue insulin therapy and check CBG only as needed.          Consultants: cardiology, critical care, EP  Procedures performed: cardiac catheterization right and left. ICD implantation   Disposition: Home Diet recommendation:  Discharge Diet Orders (From admission, onward)     Start     Ordered   11/10/22 0000  Diet - low sodium heart healthy        11/10/22 0949           Cardiac and Carb modified diet DISCHARGE MEDICATION: Allergies as of 11/10/2022       Reactions   Penicillins Rash   Has patient had a PCN reaction causing immediate rash, facial/tongue/throat swelling, SOB or lightheadedness with hypotension: unknown Has patient had a PCN reaction causing severe rash involving mucus membranes or skin necrosis: unknown Has patient had a PCN reaction that required hospitalization : yes Has patient had a PCN reaction occurring within the last 10 years: no If all of the above answers are "NO", then may proceed with Cephalosporin use.   Penicillin G Other (See Comments)   Per patient, childhood allergy, reaction unknown.          Medication List     STOP taking these medications    Cholecalciferol 1.25 MG (50000 UT)  capsule   cyanocobalamin 1000 MCG/ML injection Commonly known as: VITAMIN B12   HYDROcodone-acetaminophen 10-325 MG tablet Commonly known as: NORCO   pantoprazole 40 MG tablet Commonly known as: PROTONIX   predniSONE 5 MG (21) Tbpk tablet Commonly known as: STERAPRED UNI-PAK 21 TAB   predniSONE 50 MG tablet Commonly known as: DELTASONE   sucralfate 1 g tablet Commonly known as: Carafate       TAKE these medications    Aspirin Low Dose 81 MG chewable tablet Generic drug: aspirin Chew 1 tablet (81 mg total) by mouth daily.   carvedilol 3.125 MG tablet Commonly known as: COREG Take 1 tablet (3.125 mg total) by mouth 2 (two) times daily with a meal.   Entresto 49-51 MG Generic drug: sacubitril-valsartan Take 1 tablet by mouth 2 (two) times daily.   Farxiga 10 MG Tabs tablet Generic drug: dapagliflozin propanediol Take 1 tablet (10 mg total) by mouth daily.   furosemide 40 MG tablet Commonly known as: LASIX Take 1 tablet (40 mg total) by mouth daily.   spironolactone 25 MG tablet Commonly known as: ALDACTONE Take 1 tablet (25 mg total) by mouth daily.               Durable Medical Equipment  (From admission,  onward)           Start     Ordered   11/05/22 1629  For home use only DME Bedside commode  Once       Question:  Patient needs a bedside commode to treat with the following condition  Answer:  Heart failure   11/05/22 1628   11/05/22 1628  For home use only DME 4 wheeled rolling walker with seat  Once       Question:  Patient needs a walker to treat with the following condition  Answer:  Heart failure   11/05/22 1628            Follow-up Information     [facility] Follow up on 11/22/2022.   Specialty: Cardiology Why: follow up in the Advanced Heart Failure Clinic 11/22/22 at noon Entrance C, Valet Parking Contact information: [address] [id number] mc [place]  [address] [phone]               Discharge Exam: Filed Weights   11/08/22 0530 11/09/22 0418 11/10/22 0525  Weight: 108.5 kg 108.8 kg 107.6 kg   BP 106/67 (BP Location: Right Arm)   Pulse 96   Temp 97.7 F (36.5 C) (Oral)   Resp 16   Ht  (1.676 m)   Wt 107.6 kg   LMP  (LMP Unknown)   SpO2 98%   BMI 38.29 kg/m   Patient is feeling better, no chest pain or dyspnea, she has been out of bed.   Neurology awake and alert ENT with mild pallor Cardiovascular with S1 and S2 present and rhythmic with no gallops, rubs or murmurs Respiratory with no rales or wheezing Abdomen with no distention  No lower extremity edema   Condition at discharge: stable  The results of significant diagnostics from this hospitalization (including imaging, microbiology, ancillary and laboratory) are listed below for reference.   Imaging Studies: DG Chest 2 View  Result Date: 11/10/2022 CLINICAL DATA:  Status post left AICD placement. EXAM: CHEST - 2 VIEW COMPARISON:  11/05/2022 FINDINGS: Interval left AICD with the lead tip near the right ventricular apex. No pneumothorax. Clear lungs with normal vascularity. Unremarkable bones. IMPRESSION: AICD lead tip near the right ventricular apex. No pneumothorax. Electronically Signed   By: Beckie Salts M.D.   On: 11/10/2022 08:55   EP PPM/ICD IMPLANT  Result Date: 11/09/2022  CONCLUSIONS:  1. History of sudden out of hospital cardiac arrest  2. Successful ICD implantation with a Biotronik VDD ICD implanted for secondary prevention of sudden death.  3.  No early apparent complications.   MR CARDIAC MORPHOLOGY W WO CONTRAST  Result Date: 11/07/2022 CLINICAL DATA:  Cardiomyopathy of uncertain etiology EXAM: CARDIAC MRI TECHNIQUE: The patient was scanned on a 1.5 Tesla GE magnet. A dedicated cardiac coil was used. Functional imaging was done using Fiesta sequences. 2,3, and 4 chamber views were done to assess for RWMA's. Modified Simpson's rule using a short  axis stack was used to calculate an ejection fraction on a dedicated work [employer]. The patient received 10 cc of Gadavist. After 10 minutes inversion recovery sequences were used to assess for infiltration and scar tissue. FINDINGS: Limited images of the lung fields showed no gross abnormalities. Small circumferential pericardial effusion. Moderate left ventricular dilation with normal wall thickness, global hypokinesis EF 22%. No LV thrombus noted. Normal right ventricular size and systolic function, EF 50%. Mild left atrial enlargement,  normal right atrium. Trileaflet aortic valve with no stenosis or regurgitation noted. Probably mild mitral regurgitation visually. On delayed enhancement imaging, there was no myocardial late gadolinium enhancement (LGE). Flow sequences not done, patient unable to complete study due to claustrophobia. MEASUREMENTS: MEASUREMENTS LVEDV 253 mL LVEDVi 113 mL/m2 LVSV 57 mL LVEF 22% RVEDV 129 mL RVEDVi 58 mL/m2 RVSV 64 mL RVEF 50% T1 1096, unable to calculate ECV as post-contrast T1 sequence not acquired. IMPRESSION: 1.  Moderately dilated LV with global hypokinesis, EF 22%. 2.  Normal RV size with EF 50%. 3. No myocardial LGE, so no definitive evidence for prior MI, myocarditis, or infiltrative disease. Dalton Mclean Electronically Signed   By: Marca Ancona M.D.   On: 11/07/2022 21:37   CARDIAC CATHETERIZATION  Result Date: 11/06/2022 1. Normal coronaries. 2. Elevated filling pressures. 3. Mild pulmonary venous hypertension. 4. Preserved cardiac output.   DG CHEST PORT 1 VIEW  Result Date: 11/05/2022 CLINICAL DATA:  NG tube placement EXAM: PORTABLE CHEST 1 VIEW COMPARISON:  CXR 11/03/22 FINDINGS: Right arm PICC with tip near the cavoatrial junction/upper right atrium. Endotracheal tube terminates approximately 1.5 cm above the carina. Enteric tube courses below diaphragm with the side hole and tip out of the field of view. No pleural effusion. No  pneumothorax. Cardiomegaly. Compared to prior exam there is increased bilateral perihilar opacities, which could be seen in the setting of pulmonary edema or atypical infection. Visualized upper abdomen is unremarkable. No radiographically apparent displaced rib fractures. IMPRESSION: Increased bilateral perihilar opacities, which could be seen in the setting of pulmonary edema. Cardiomegaly. Electronically Signed   By: Lorenza Cambridge M.D.   On: 11/05/2022 07:26   MR CERVICAL SPINE WO CONTRAST  Result Date: 11/04/2022 CLINICAL DATA:  Myelopathy, chronic, cervical spine. EXAM: LIMITED MRI CERVICAL SPINE WITHOUT CONTRAST TECHNIQUE: Sagittal multisequence MR imaging of the cervical spine was performed. Patient was unable to complete the examination. No axial imaging obtained. No intravenous contrast was administered. COMPARISON:  Cervical spine CT 06/24/2020. Cervical spine radiographs 07/26/2022. FINDINGS: Despite efforts by the technologist and patient, moderate motion artifact is present on today's exam and could not be eliminated. This reduces exam sensitivity and specificity. As above, patient unable to complete examination. No axial images obtained. Alignment: Normal. Vertebrae: No evidence of acute fracture or traumatic subluxation. No suspicious focal osseous lesions. Cord: Normal in signal and caliber. No evidence for myelopathy on sagittal imaging. The spinal canal appears widely patent. Posterior Fossa, vertebral arteries, paraspinal tissues: The posterior fossa appears unremarkable as imaged in the sagittal plane. No paraspinal abnormalities identified on limited sagittal imaging. Disc levels: Sagittal images demonstrate mild spondylosis at C4-5 with asymmetric uncinate spurring on the right. This does not appear to cause more than mild right-sided foraminal narrowing based on the sagittal images. No large disc herniation, cord deformity or high-grade foraminal narrowing identified. IMPRESSION: 1.  Limited examination. The patient was unable to complete the examination due to claustrophobia. No axial images obtained. 2. No evidence of cord deformity, myelopathy or significant spinal stenosis. 3. Mild spondylosis at C4-5 with asymmetric uncinate spurring on the right contributing to mild right-sided foraminal narrowing. No large disc herniation, cord deformity or high-grade foraminal narrowing identified. Electronically Signed   By: Carey Bullocks M.D.   On: 11/04/2022 16:47   DG CHEST PORT 1 VIEW  Result Date: 11/03/2022 CLINICAL DATA:  Shortness of breath EXAM: PORTABLE CHEST 1 VIEW COMPARISON:  Chest radiograph dated 11/02/2022 FINDINGS: Lines/tubes: Endotracheal tube tip projects 3.0 cm  above the carina. Enteric tube tip reaches the diaphragm and terminates below the field of view. Right upper extremity PICC tip projects over the right atrium. Chest: Low lung volumes with bilateral interstitial and bibasilar patchy opacities. Pleura: Trace bilateral pleural effusions.  No pneumothorax. Heart/mediastinum: Similar enlarged cardiomediastinal silhouette. Bones: No acute osseous abnormality. IMPRESSION: 1. Low lung volumes with bibasilar patchy opacities, which may represent atelectasis, aspiration, or pneumonia. 2. Trace bilateral pleural effusions and pulmonary edema. 3. Support lines and tubes as described. Electronically Signed   By: Agustin Cree M.D.   On: 11/03/2022 12:14   DG Abd Portable 1V  Result Date: 11/02/2022 CLINICAL DATA:  [id number] Encounter for feeding tube placement [id number] EXAM: PORTABLE ABDOMEN - 1 VIEW COMPARISON:  Radiograph 11/01/2022 FINDINGS: Nonobstructive bowel gas pattern. Mild gaseous distention of the colon. Feeding tube courses across the right hemiabdomen, inferiorly than cephalad with tip likely overlying the duodenum. Surgical clips noted. IMPRESSION: Feeding tube tip overlies the right hemiabdomen, likely overlying the duodenum. Electronically Signed   By: Caprice Renshaw M.D.    On: 11/02/2022 11:09   DG Chest Port 1 View  Result Date: 11/02/2022 CLINICAL DATA:  Cardiac arrest EXAM: PORTABLE CHEST 1 VIEW COMPARISON:  CXR 11/01/22 FINDINGS: Endotracheal tube terminates at the level of the carina. Enteric tube courses below diaphragm with the tip out of the field of view. Right arm PICC terminates in the right atrium. Persistent cardiomegaly. No pleural effusion. No pneumothorax. Compared to prior exam there is a new hazy opacity at the right lung base/costophrenic angle. There are prominent bilateral interstitial opacities, which are favored to represent findings related to pulmonary venous congestion or mild pulmonary edema. No radiographically apparent displaced rib fractures. Visualized upper abdomen is unremarkable. IMPRESSION: 1. New hazy opacity at the right lung base/costophrenic angle, which may represent atelectasis or infection. 2. Prominent bilateral interstitial opacities, which are favored to represent findings related to pulmonary venous congestion or mild pulmonary edema. 3. Endotracheal tube terminates at the level of the carina. Electronically Signed   By: Lorenza Cambridge M.D.   On: 11/02/2022 08:37   DG Abd 1 View  Result Date: 11/01/2022 CLINICAL DATA:  Check gastric catheter placement EXAM: ABDOMEN - 1 VIEW COMPARISON:  Film from earlier in the same day. FINDINGS: Gastric catheter is noted in the distal stomach stable in appearance from the prior exam. Scattered large and small bowel gas is noted. IMPRESSION: Gastric catheter within the stomach. Electronically Signed   By: Alcide Clever M.D.   On: 11/01/2022 22:00   ECHOCARDIOGRAM COMPLETE  Result Date: 11/01/2022    ECHOCARDIOGRAM REPORT   Patient Name:   AMANI MARSEILLE Date of Exam: 11/01/2022 Medical Rec #:  [id number]        Height:       66.0 in Accession #:    [id number]       Weight:       262.3 lb Date of Birth:  [birth year]        BSA:          2.244 m Patient Age:    51 years         BP:           100/72  mmHg Patient Gender: F                HR:           87 bpm. Exam Location:  Inpatient Procedure: 2D Echo, Cardiac Doppler and Color Doppler Indications:  cardiac arrest  History:        Patient has no prior history of Echocardiogram examinations.                 Arrythmias:Ventricular Fibrillation.  Sonographer:    Delcie RochLauren Pennington RDCS Referring Phys: [phone] PAULA B SIMPSON  Sonographer Comments: Patient is obese and echo performed with patient supine and on artificial respirator. Image acquisition challenging due to patient body habitus. IMPRESSIONS  1. Global hypokinesis worse in septum and apex. Left ventricular ejection fraction, by estimation, is 25%. The left ventricle has normal function. The left ventricle demonstrates global hypokinesis. The left ventricular internal cavity size was severely  dilated. Left ventricular diastolic parameters are consistent with Grade II diastolic dysfunction (pseudonormalization). Elevated left ventricular end-diastolic pressure.  2. Right ventricular systolic function is normal. The right ventricular size is normal. There is normal pulmonary artery systolic pressure.  3. Left atrial size was moderately dilated.  4. The mitral valve is abnormal. Mild mitral valve regurgitation. No evidence of mitral stenosis.  5. The aortic valve is tricuspid. Aortic valve regurgitation is not visualized. No aortic stenosis is present.  6. The inferior vena cava is normal in size with greater than 50% respiratory variability, suggesting right atrial pressure of 3 mmHg. FINDINGS  Left Ventricle: Global hypokinesis worse in septum and apex. Left ventricular ejection fraction, by estimation, is 25%. The left ventricle has normal function. The left ventricle demonstrates global hypokinesis. The left ventricular internal cavity size  was severely dilated. There is no left ventricular hypertrophy. Left ventricular diastolic parameters are consistent with Grade II diastolic dysfunction  (pseudonormalization). Elevated left ventricular end-diastolic pressure. Right Ventricle: The right ventricular size is normal. No increase in right ventricular wall thickness. Right ventricular systolic function is normal. There is normal pulmonary artery systolic pressure. The tricuspid regurgitant velocity is 2.81 m/s, and  with an assumed right atrial pressure of 3 mmHg, the estimated right ventricular systolic pressure is 34.6 mmHg. Left Atrium: Left atrial size was moderately dilated. Right Atrium: Right atrial size was normal in size. Pericardium: There is no evidence of pericardial effusion. Mitral Valve: The mitral valve is abnormal. There is mild thickening of the mitral valve leaflet(s). There is mild calcification of the mitral valve leaflet(s). Mild mitral valve regurgitation. No evidence of mitral valve stenosis. Tricuspid Valve: The tricuspid valve is normal in structure. Tricuspid valve regurgitation is mild . No evidence of tricuspid stenosis. Aortic Valve: The aortic valve is tricuspid. Aortic valve regurgitation is not visualized. No aortic stenosis is present. Pulmonic Valve: The pulmonic valve was normal in structure. Pulmonic valve regurgitation is not visualized. No evidence of pulmonic stenosis. Aorta: The aortic root is normal in size and structure. Venous: The inferior vena cava is normal in size with greater than 50% respiratory variability, suggesting right atrial pressure of 3 mmHg. IAS/Shunts: The interatrial septum was not well visualized.  LEFT VENTRICLE PLAX 2D LVIDd:         6.60 cm      Diastology LVIDs:         5.70 cm      LV e' medial:    4.68 cm/s LV PW:         1.10 cm      LV E/e' medial:  22.6 LV IVS:        0.80 cm      LV e' lateral:   5.77 cm/s LVOT diam:     1.80 cm  LV E/e' lateral: 18.4 LV SV:         26 LV SV Index:   12 LVOT Area:     2.54 cm  LV Volumes (MOD) LV vol d, MOD A4C: 168.0 ml LV vol s, MOD A4C: 133.0 ml LV SV MOD A4C:     168.0 ml RIGHT VENTRICLE              IVC RV S prime:     11.70 cm/s  IVC diam: 1.80 cm TAPSE (M-mode): 2.3 cm LEFT ATRIUM             Index        RIGHT ATRIUM           Index LA diam:        4.90 cm 2.18 cm/m   RA Area:     13.90 cm LA Vol (A2C):   92.1 ml 41.04 ml/m  RA Volume:   33.40 ml  14.88 ml/m LA Vol (A4C):   55.5 ml 24.73 ml/m LA Biplane Vol: 79.3 ml 35.33 ml/m  AORTIC VALVE LVOT Vmax:   63.90 cm/s LVOT Vmean:  42.400 cm/s LVOT VTI:    0.104 m  AORTA Ao Root diam: 2.30 cm Ao Asc diam:  2.70 cm MITRAL VALVE                 TRICUSPID VALVE MV Area (PHT): 6.12 cm      TR Peak grad:   31.6 mmHg MV Decel Time: 124 msec      TR Vmax:        281.00 cm/s MR Peak grad:   51.0 mmHg MR Mean grad:   34.0 mmHg    SHUNTS MR Vmax:        357.00 cm/s  Systemic VTI:  0.10 m MR Vmean:       272.0 cm/s   Systemic Diam: 1.80 cm MR PISA:        0.25 cm MR PISA Radius: 0.20 cm MV E velocity: 106.00 cm/s MV A velocity: 52.90 cm/s MV E/A ratio:  2.00 Charlton Haws MD Electronically signed by Charlton Haws MD Signature Date/Time: 11/01/2022/2:59:22 PM    Final    EEG adult  Result Date: 11/01/2022 Charlsie Quest, MD     11/01/2022  8:49 AM Patient Name: OSHYN METRO MRN: [id number] Epilepsy Attending: Charlsie Quest Referring Physician/Provider: Duayne Cal, NP Date: 11/01/2022 Duration: 24.25 mins Patient history: 52yo F s/p cardiac arrest. EEG to evaluate for seizure Level of alertness: comatose AEDs during EEG study: Versed Technical aspects: This EEG study was done with scalp electrodes positioned according to the 10-20 International system of electrode placement. Electrical activity was reviewed with band pass filter of 1-70Hz , sensitivity of 7 uV/mm, display speed of 27mm/sec with a 60Hz  notched filter applied as appropriate. EEG data were recorded continuously and digitally stored.  Video monitoring was available and reviewed as appropriate. Description: EEG showed continuous generalized background attenuation. Hyperventilation and  photic stimulation were not performed.   ABNORMALITY - Background attenuation, generalized IMPRESSION: This study is suggestive of profound diffuse encephalopathy, nonspecific etiology. No seizures or epileptiform discharges were seen throughout the recording. Charlsie Quest   [place] EKG SITE RITE  Result Date: 11/01/2022 If [facility] image not attached, placement could not be confirmed due to current cardiac rhythm.  CT HEAD WO CONTRAST ( )  Result Date: 11/01/2022 CLINICAL DATA:  Cardiac arrest. EXAM: CT HEAD WITHOUT CONTRAST TECHNIQUE: Contiguous axial images were  obtained from the base of the skull through the vertex without intravenous contrast. RADIATION DOSE REDUCTION: This exam was performed according to the departmental dose-optimization program which includes automated exposure control, adjustment of the mA and/or kV according to patient size and/or use of iterative reconstruction technique. COMPARISON:  03/12/2022 FINDINGS: Brain: No evidence of acute infarction, hemorrhage, hydrocephalus, extra-axial collection or mass lesion/mass effect. Vascular: No hyperdense vessel or unexpected calcification. Skull: Enlarged biparietal foramina, incidental and developmental. Sinuses/Orbits: No acute finding. Other: Multiple levels of streak artifact due to metal hardware. IMPRESSION: Negative head CT. Electronically Signed   By: Tiburcio Pea M.D.   On: 11/01/2022 04:23   DG Abd Portable 1 View  Result Date: 11/01/2022 CLINICAL DATA:  Check nasogastric catheter placement EXAM: PORTABLE ABDOMEN - 1 VIEW COMPARISON:  None Available. FINDINGS: Scattered large and small bowel gas is noted. Gastric catheter is noted in the distal stomach. No obstructive changes are seen. IMPRESSION: Gastric catheter within the stomach. Electronically Signed   By: Alcide Clever M.D.   On: 11/01/2022 03:07   DG Chest Port 1 View  Result Date: 11/01/2022 CLINICAL DATA:  Status post intubation EXAM: PORTABLE CHEST 1 VIEW  COMPARISON:  06/16/2022 FINDINGS: Cardiac shadow is mildly prominent but accentuated by the portable technique. Endotracheal tube is noted at the level of the carina. This could be withdrawn 1-2 cm for improved positioning. Gastric catheter extends into the stomach. Lungs are well aerated bilaterally. Increased central airspace opacity is noted particularly on the right likely related to pulmonary edema. IMPRESSION: Tubes and lines as described above. Endotracheal tube could be withdrawn slightly as described. Diffuse increased opacity is noted within the right lung likely representing edema. Electronically Signed   By: Alcide Clever M.D.   On: 11/01/2022 03:07    Microbiology: Results for orders placed or performed during the hospital encounter of 11/01/22  MRSA Next Gen by PCR, Nasal     Status: None   Collection Time: 11/01/22  5:37 AM   Specimen: Nasal Mucosa; Nasal Swab  Result Value Ref Range Status   MRSA by PCR Next Gen NOT DETECTED NOT DETECTED Final    Comment: (NOTE) The GeneXpert MRSA Assay (FDA approved for NASAL specimens only), is one component of a comprehensive MRSA colonization surveillance program. It is not intended to diagnose MRSA infection nor to guide or monitor treatment for MRSA infections. Test performance is not FDA approved in patients less than 66 years old. Performed at [facility], [address], [place], [place] [address]   Culture, Respiratory w Gram Stain     Status: None   Collection Time: 11/01/22  5:52 AM   Specimen: Endotracheal; Respiratory  Result Value Ref Range Status   Specimen Description ENDOTRACHEAL  Final   Special Requests NONE  Final   Gram Stain   Final    MODERATE WBC PRESENT, PREDOMINANTLY PMN RARE GRAM POSITIVE COCCI IN PAIRS    Culture   Final    RARE Normal respiratory flora-no Staph aureus or Pseudomonas seen Performed at [facility], [address], [place], [place] [address]    Report Status 11/03/2022 FINAL   Final  Culture, blood (Routine X 2) w Reflex to ID Panel     Status: None   Collection Time: 11/01/22 11:26 AM   Specimen: BLOOD LEFT WRIST  Result Value Ref Range Status   Specimen Description BLOOD LEFT WRIST  Final   Special Requests   Final    BOTTLES  DRAWN AEROBIC ONLY Blood Culture adequate volume   Culture   Final    NO GROWTH 5 DAYS Performed at [facility], [address] [place], [place] [address]    Report Status 11/06/2022 FINAL  Final  Culture, blood (Routine X 2) w Reflex to ID Panel     Status: None   Collection Time: 11/01/22 12:57 PM   Specimen: BLOOD  Result Value Ref Range Status   Specimen Description BLOOD FOOT  Final   Special Requests   Final    BOTTLES DRAWN AEROBIC AND ANAEROBIC Blood Culture results may not be optimal due to an inadequate volume of blood received in culture bottles   Culture   Final    NO GROWTH 5 DAYS Performed at [facility], [address] [place], [place] [address]    Report Status 11/06/2022 FINAL  Final  Surgical PCR screen     Status: None   Collection Time: 11/09/22  3:00 PM   Specimen: Nasal Mucosa; Nasal Swab  Result Value Ref Range Status   MRSA, PCR NEGATIVE NEGATIVE Final   Staphylococcus aureus NEGATIVE NEGATIVE Final    Comment: (NOTE) The Xpert SA Assay (FDA approved for NASAL specimens in patients 4 years of age and older), is one component of a comprehensive surveillance program. It is not intended to diagnose infection nor to guide or monitor treatment. Performed at [facility], [address] [place], [place] [address]     Labs: CBC: Recent Labs  Lab 11/04/22 0410 11/05/22 0430 11/06/22 0209 11/06/22 0839  WBC 5.7 4.7 5.0  --   HGB 11.4* 11.0* 11.1* 11.6*  11.2*  HCT 34.3* 35.1* 36.2 34.0*  33.0*  MCV 83.1 86.5 87.7  --   PLT 135* 135* 142*  --    Basic Metabolic Panel: Recent Labs  Lab 11/04/22 0410 11/05/22 0430 11/06/22 0209 11/06/22 0839 11/07/22 0642  11/08/22 0401 11/09/22 0443 11/10/22 0134  NA 140 140 139 143  143 139 139 139 136  K 3.8 3.5 3.6 3.9  3.8 4.1 3.5 4.2 4.2  CL 108 105 106  --  102 98 103 100  CO2 24 25 24   --  29 28 28 25   GLUCOSE 116* 105* 86  --  92 84 96 102*  BUN 20 17 12   --  15 18 17 16   CREATININE 1.20* 1.02* 0.98  --  1.20* 1.18* 1.12* 1.19*  CALCIUM 9.0 9.3 9.4  --  10.0 10.1 9.9 9.9  MG 1.8 2.4 2.1  --   --  1.9  --   --   PHOS 3.4 3.8 3.8  --   --   --   --   --    Liver Function Tests: Recent Labs  Lab 11/04/22 0410 11/05/22 0430  AST 16 14*  ALT 23 17  ALKPHOS 79 74  BILITOT 0.6 0.5  PROT 5.6* 5.8*  ALBUMIN 2.5* 2.5*   CBG: Recent Labs  Lab 11/08/22 0417 11/08/22 0726 11/08/22 1218 11/08/22 1617 11/08/22 2153  GLUCAP 87 77 97 78 96    Discharge time spent: greater than 30 minutes.  Signed: Coralie Keens, MD [facility] 11/10/2022

## 2022-11-10 NOTE — Progress Notes (Signed)
Rounding Note    Patient Name: NEEKA DEGOOD Date of Encounter: 11/10/2022  [facility] Cardiologist: None prior  Subjective   No complaints this morning, feels well, hopes to go home soon  Inpatient Medications    Scheduled Meds:  aspirin  81 mg Oral Daily   carvedilol  3.125 mg Oral BID WC   Chlorhexidine Gluconate Cloth  6 each Topical Q0600   dapagliflozin propanediol  10 mg Oral Daily   docusate  100 mg Per Tube BID   folic acid  1 mg Oral Daily   furosemide  40 mg Oral Daily   multivitamin with minerals  1 tablet Oral Daily   polyethylene glycol  17 g Per Tube Daily   Ensure Max Protein  11 oz Oral BID   sacubitril-valsartan  1 tablet Oral BID   sodium chloride flush  10-40 mL Intracatheter Q12H   sodium chloride flush  3 mL Intravenous Q12H   sodium chloride flush  3 mL Intravenous Q12H   sodium chloride flush  3 mL Intravenous Q12H   spironolactone  25 mg Oral Daily   thiamine  100 mg Oral Daily   Continuous Infusions:  sodium chloride     PRN Meds: sodium chloride, acetaminophen **OR** acetaminophen (TYLENOL) oral liquid 160 mg/5 mL **OR** acetaminophen, acetaminophen, albuterol, mouth rinse, prochlorperazine, sodium chloride flush, sodium chloride flush, white petrolatum   Vital Signs    Vitals:   11/09/22 1826 11/09/22 2001 11/09/22 2348 11/10/22 0525  BP: 125/79 (!) 99/58 116/71 107/69  Pulse: (!) 102 94  99  Resp:  18  16  Temp:  97.9 F (36.6 C)  97.9 F (36.6 C)  TempSrc:  Oral  Oral  SpO2: 98% 98%  98%  Weight:    107.6 kg  Height:        Intake/Output Summary (Last 24 hours) at 11/10/2022 5859 Last data filed at 11/10/2022 0310 Gross per 24 hour  Intake 0 ml  Output 700 ml  Net -700 ml      11/10/2022    5:25 AM 11/09/2022    4:18 AM 11/08/2022    5:30 AM  Last 3 Weights  Weight (lbs) 237 lb 3.2 oz 239 lb 12.8 oz 239 lb 3.2 oz  Weight (kg) 107.593 kg 108.773 kg 108.5 kg      Telemetry    Unchanged, SR/ST no VT -  Personally Reviewed  ECG    No new EKGs - Personally Reviewed  Physical Exam   GEN: No acute distress.   Neck: No JVD Cardiac: RRR, no murmurs, rubs, or gallops.  Device site: appropriately tender.  Small amount of bloody soil on Steri-Strips. No appreciable hematoma. Respiratory: CTA b/l. GI: Soft, nontender, non-distended  MS: No edema; No deformity. Neuro:  Nonfocal, AAO x4 today Psych: Normal affect   Labs    High Sensitivity Troponin:   Recent Labs  Lab 11/01/22 0250 11/01/22 0440 11/01/22 1213  TROPONINIHS 27* 99* 178*     Chemistry Recent Labs  Lab 11/04/22 0410 11/05/22 0430 11/06/22 0209 11/06/22 0839 11/08/22 0401 11/09/22 0443 11/10/22 0134  NA 140 140 139   < > 139 139 136  K 3.8 3.5 3.6   < > 3.5 4.2 4.2  CL 108 105 106   < > 98 103 100  CO2 24 25 24    < > 28 28 25   GLUCOSE 116* 105* 86   < > 84 96 102*  BUN 20 17 12    < >  18 17 16   CREATININE 1.20* 1.02* 0.98   < > 1.18* 1.12* 1.19*  CALCIUM 9.0 9.3 9.4   < > 10.1 9.9 9.9  MG 1.8 2.4 2.1  --  1.9  --   --   PROT 5.6* 5.8*  --   --   --   --   --   ALBUMIN 2.5* 2.5*  --   --   --   --   --   AST 16 14*  --   --   --   --   --   ALT 23 17  --   --   --   --   --   ALKPHOS 79 74  --   --   --   --   --   BILITOT 0.6 0.5  --   --   --   --   --   GFRNONAA 55* >60 >60   < > 56* 60* 56*  ANIONGAP 8 10 9    < > 13 8 11    < > = values in this interval not displayed.    Lipids No results for input(s): "CHOL", "TRIG", "HDL", "LABVLDL", "LDLCALC", "CHOLHDL" in the last 168 hours.  Hematology Recent Labs  Lab 11/04/22 0410 11/05/22 0430 11/06/22 0209 11/06/22 0839  WBC 5.7 4.7 5.0  --   RBC 4.13 4.06 4.13  --   HGB 11.4* 11.0* 11.1* 11.6*  11.2*  HCT 34.3* 35.1* 36.2 34.0*  33.0*  MCV 83.1 86.5 87.7  --   MCH 27.6 27.1 26.9  --   MCHC 33.2 31.3 30.7  --   RDW 15.5 15.6* 15.4  --   PLT 135* 135* 142*  --    Thyroid  Recent Labs  Lab 11/06/22 0209  TSH 2.716    BNPNo results for  input(s): "BNP", "PROBNP" in the last 168 hours.  DDimer No results for input(s): "DDIMER" in the last 168 hours.   Radiology    CXR - reviewed by me today. No pneumothorax. Lead location and slack are good.  Cardiac Studies   11/07/22: c.MRI IMPRESSION: 1.  Moderately dilated LV with global hypokinesis, EF 22%. 2.  Normal RV size with EF 50%. 3. No myocardial LGE, so no definitive evidence for prior MI, myocarditis, or infiltrative disease.  11/06/22: R/LHC 1. Normal coronaries.  2. Elevated filling pressures.  3. Mild pulmonary venous hypertension.  4. Preserved cardiac output.  RA mean 8 RV 46/10 PA 47/28, mean 36 PCWP mean 26 LV 130/28 AO 132/78  Oxygen saturations: PA 66% AO 96%  Cardiac Output (Fick) 6.45  Cardiac Index (Fick) 2.94 PVR 1.55 WU    11/01/22: 11/01/22 1. Global hypokinesis worse in septum and apex. Left ventricular ejection  fraction, by estimation, is 25%. The left ventricle has normal function.  The left ventricle demonstrates global hypokinesis. The left ventricular  internal cavity size was severely   dilated. Left ventricular diastolic parameters are consistent with Grade  II diastolic dysfunction (pseudonormalization). Elevated left ventricular  end-diastolic pressure.   2. Right ventricular systolic function is normal. The right ventricular  size is normal. There is normal pulmonary artery systolic pressure.   3. Left atrial size was moderately dilated.   4. The mitral valve is abnormal. Mild mitral valve regurgitation. No  evidence of mitral stenosis.   5. The aortic valve is tricuspid. Aortic valve regurgitation is not  visualized. No aortic stenosis is present.   6. The inferior vena cava is  normal in size with greater than 50%  respiratory variability, suggesting right atrial pressure of 3 mmHg.     Patient Profile     52 y.o. female with a hx of bariatric surgery (DUODENAL SWITCH LAPAROSCOPIC on 06/06/22), GERD admitted with cardiac  arrest  Assessment & Plan    OOH cardiac arrest Suspect NICM is not new post arrest and likely etiology of her event She has been informed of the [place] driving restriction of 6 mo.  Short PR (4/3 ECG), possibly a pathway -- atrial fascicular or atrial nodal bypass tract; seen during ectopic atrial rhythm  No further NSVTs Did have brief SVT Occ PVCs  3.  NICM Per primary cardiology team.  4.   Post arrest hypoxic encephalopathy Much improved AAO x4 and appropriate now   For questions or updates, please contact [facility] Please consult [email or web address] for contact info under        Signed, Maurice SmallAugustus E Bryen Hinderman, MD  11/10/2022, 7:02 AM

## 2022-11-10 NOTE — Progress Notes (Signed)
CARDIAC REHAB PHASE I   PRE:  Rate/Rhythm: 100 SR rare PVC  BP:  Sitting: 119/87      SaO2: 96 RA  MODE:  Ambulation: 270 ft   POST:  Rate/Rhythm: 121 ST  BP:  Standing: 119/101      SaO2: 98 RA  Pt tolerated exercise well and amb 270 ft with stand-by assist. Pt denies CP, SOB, or dizziness throughout walk. Pt still somewhat slow ambulating. Pt is wanting to go home. Will continue to follow.  [id number] Joya San, MS, ACSM-CEP 11/10/2022 9:32 AM

## 2022-11-10 NOTE — Plan of Care (Signed)
Problem: Education: Goal: Ability to manage disease process will improve 11/10/2022 0803 by Ancil Linsey, RN Outcome: Adequate for Discharge 11/10/2022 0803 by Ancil Linsey, RN Outcome: Adequate for Discharge   Problem: Cardiac: Goal: Ability to achieve and maintain adequate cardiopulmonary perfusion will improve 11/10/2022 0803 by Ancil Linsey, RN Outcome: Adequate for Discharge 11/10/2022 0803 by Ancil Linsey, RN Outcome: Adequate for Discharge   Problem: Neurologic: Goal: Promote progressive neurologic recovery 11/10/2022 0803 by Ancil Linsey, RN Outcome: Adequate for Discharge 11/10/2022 0803 by Ancil Linsey, RN Outcome: Adequate for Discharge   Problem: Skin Integrity: Goal: Risk for impaired skin integrity will be minimized. 11/10/2022 0803 by Ancil Linsey, RN Outcome: Adequate for Discharge 11/10/2022 0803 by Ancil Linsey, RN Outcome: Adequate for Discharge   Problem: Education: Goal: Knowledge of General Education information will improve Description: Including pain rating scale, medication(s)/side effects and non-pharmacologic comfort measures 11/10/2022 0803 by Ancil Linsey, RN Outcome: Adequate for Discharge 11/10/2022 0803 by Ancil Linsey, RN Outcome: Adequate for Discharge   Problem: Health Behavior/Discharge Planning: Goal: Ability to manage health-related needs will improve 11/10/2022 0803 by Ancil Linsey, RN Outcome: Adequate for Discharge 11/10/2022 0803 by Ancil Linsey, RN Outcome: Adequate for Discharge   Problem: Clinical Measurements: Goal: Ability to maintain clinical measurements within normal limits will improve 11/10/2022 0803 by Ancil Linsey, RN Outcome: Adequate for Discharge 11/10/2022 0803 by Ancil Linsey, RN Outcome: Adequate for Discharge Goal: Will remain free from infection 11/10/2022 0803 by Ancil Linsey, RN Outcome: Adequate for Discharge 11/10/2022 0803 by  Ancil Linsey, RN Outcome: Adequate for Discharge Goal: Diagnostic test results will improve 11/10/2022 0803 by Ancil Linsey, RN Outcome: Adequate for Discharge 11/10/2022 0803 by Ancil Linsey, RN Outcome: Adequate for Discharge Goal: Respiratory complications will improve 11/10/2022 0803 by Ancil Linsey, RN Outcome: Adequate for Discharge 11/10/2022 0803 by Ancil Linsey, RN Outcome: Adequate for Discharge Goal: Cardiovascular complication will be avoided 11/10/2022 0803 by Ancil Linsey, RN Outcome: Adequate for Discharge 11/10/2022 0803 by Ancil Linsey, RN Outcome: Adequate for Discharge   Problem: Activity: Goal: Risk for activity intolerance will decrease 11/10/2022 0803 by Ancil Linsey, RN Outcome: Adequate for Discharge 11/10/2022 0803 by Ancil Linsey, RN Outcome: Adequate for Discharge   Problem: Nutrition: Goal: Adequate nutrition will be maintained 11/10/2022 0803 by Ancil Linsey, RN Outcome: Adequate for Discharge 11/10/2022 0803 by Ancil Linsey, RN Outcome: Adequate for Discharge   Problem: Coping: Goal: Level of anxiety will decrease 11/10/2022 0803 by Ancil Linsey, RN Outcome: Adequate for Discharge 11/10/2022 0803 by Ancil Linsey, RN Outcome: Adequate for Discharge   Problem: Elimination: Goal: Will not experience complications related to bowel motility 11/10/2022 0803 by Ancil Linsey, RN Outcome: Adequate for Discharge 11/10/2022 0803 by Ancil Linsey, RN Outcome: Adequate for Discharge Goal: Will not experience complications related to urinary retention 11/10/2022 0803 by Ancil Linsey, RN Outcome: Adequate for Discharge 11/10/2022 0803 by Ancil Linsey, RN Outcome: Adequate for Discharge   Problem: Pain Managment: Goal: General experience of comfort will improve 11/10/2022 0803 by Ancil Linsey, RN Outcome: Adequate for Discharge 11/10/2022 0803 by Ancil Linsey, RN Outcome: Adequate for Discharge   Problem: Safety: Goal: Ability to remain free from injury will improve 11/10/2022 0803 by Ancil Linsey, RN Outcome: Adequate for Discharge 11/10/2022 0803 by Ancil Linsey, RN Outcome: Adequate for Discharge  Problem: Skin Integrity: Goal: Risk for impaired skin integrity will decrease 11/10/2022 0803 by Ancil LinseyBuendia, Colter Magowan O, RN Outcome: Adequate for Discharge 11/10/2022 0803 by Ancil LinseyBuendia, Xian Apostol O, RN Outcome: Adequate for Discharge   Problem: Education: Goal: Ability to describe self-care measures that may prevent or decrease complications (Diabetes Survival Skills Education) will improve 11/10/2022 0803 by Ancil LinseyBuendia, Shabria Egley O, RN Outcome: Adequate for Discharge 11/10/2022 0803 by Ancil LinseyBuendia, Steele Stracener O, RN Outcome: Adequate for Discharge Goal: Individualized Educational Video(s) 11/10/2022 0803 by Ancil LinseyBuendia, Yancy Hascall O, RN Outcome: Adequate for Discharge 11/10/2022 0803 by Ancil LinseyBuendia, Hether Anselmo O, RN Outcome: Adequate for Discharge   Problem: Coping: Goal: Ability to adjust to condition or change in health will improve 11/10/2022 0803 by Ancil LinseyBuendia, Marigene Erler O, RN Outcome: Adequate for Discharge 11/10/2022 0803 by Ancil LinseyBuendia, Sixto Bowdish O, RN Outcome: Adequate for Discharge   Problem: Fluid Volume: Goal: Ability to maintain a balanced intake and output will improve 11/10/2022 0803 by Ancil LinseyBuendia, Danique Hartsough O, RN Outcome: Adequate for Discharge 11/10/2022 0803 by Ancil LinseyBuendia, Callie Bunyard O, RN Outcome: Adequate for Discharge   Problem: Health Behavior/Discharge Planning: Goal: Ability to identify and utilize available resources and services will improve 11/10/2022 0803 by Ancil LinseyBuendia, Conny Situ O, RN Outcome: Adequate for Discharge 11/10/2022 0803 by Ancil LinseyBuendia, Aubre Quincy O, RN Outcome: Adequate for Discharge Goal: Ability to manage health-related needs will improve 11/10/2022 0803 by Ancil LinseyBuendia, Lucylle Foulkes O, RN Outcome: Adequate for Discharge 11/10/2022 0803 by Ancil LinseyBuendia,  Endi Lagman O, RN Outcome: Adequate for Discharge   Problem: Metabolic: Goal: Ability to maintain appropriate glucose levels will improve 11/10/2022 0803 by Ancil LinseyBuendia, Mylinda Brook O, RN Outcome: Adequate for Discharge 11/10/2022 0803 by Ancil LinseyBuendia, Child Campoy O, RN Outcome: Adequate for Discharge   Problem: Nutritional: Goal: Maintenance of adequate nutrition will improve 11/10/2022 0803 by Ancil LinseyBuendia, Azir Muzyka O, RN Outcome: Adequate for Discharge 11/10/2022 0803 by Ancil LinseyBuendia, Yacine Droz O, RN Outcome: Adequate for Discharge Goal: Progress toward achieving an optimal weight will improve 11/10/2022 0803 by Ancil LinseyBuendia, Mehgan Santmyer O, RN Outcome: Adequate for Discharge 11/10/2022 0803 by Ancil LinseyBuendia, Shray Hunley O, RN Outcome: Adequate for Discharge   Problem: Skin Integrity: Goal: Risk for impaired skin integrity will decrease 11/10/2022 0803 by Ancil LinseyBuendia, Kara Melching O, RN Outcome: Adequate for Discharge 11/10/2022 0803 by Ancil LinseyBuendia, Kimala Horne O, RN Outcome: Adequate for Discharge   Problem: Tissue Perfusion: Goal: Adequacy of tissue perfusion will improve 11/10/2022 0803 by Ancil LinseyBuendia, Sherese Heyward O, RN Outcome: Adequate for Discharge 11/10/2022 0803 by Ancil LinseyBuendia, Leslee Suire O, RN Outcome: Adequate for Discharge   Problem: Education: Goal: Understanding of CV disease, CV risk reduction, and recovery process will improve 11/10/2022 0803 by Ancil LinseyBuendia, Akshath Mccarey O, RN Outcome: Adequate for Discharge 11/10/2022 0803 by Ancil LinseyBuendia, Remo Kirschenmann O, RN Outcome: Adequate for Discharge Goal: Individualized Educational Video(s) 11/10/2022 0803 by Ancil LinseyBuendia, Alethea Terhaar O, RN Outcome: Adequate for Discharge 11/10/2022 0803 by Ancil LinseyBuendia, Mychelle Kendra O, RN Outcome: Adequate for Discharge   Problem: Activity: Goal: Ability to return to baseline activity level will improve 11/10/2022 0803 by Ancil LinseyBuendia, Ignacio Lowder O, RN Outcome: Adequate for Discharge 11/10/2022 0803 by Ancil LinseyBuendia, Yohance Hathorne O, RN Outcome: Adequate for Discharge   Problem: Cardiovascular: Goal: Ability to achieve and  maintain adequate cardiovascular perfusion will improve 11/10/2022 0803 by Ancil LinseyBuendia, Jenyfer Trawick O, RN Outcome: Adequate for Discharge 11/10/2022 0803 by Ancil LinseyBuendia, Missy Baksh O, RN Outcome: Adequate for Discharge Goal: Vascular access site(s) Level 0-1 will be maintained 11/10/2022 0803 by Ancil LinseyBuendia, Greidy Sherard O, RN Outcome: Adequate for Discharge 11/10/2022 0803 by Ancil LinseyBuendia, Kamori Barbier O, RN Outcome: Adequate for Discharge   Problem: Health Behavior/Discharge Planning: Goal: Ability to safely manage health-related  needs after discharge will improve 11/10/2022 0803 by Ancil Linsey, RN Outcome: Adequate for Discharge 11/10/2022 0803 by Ancil Linsey, RN Outcome: Adequate for Discharge   Problem: Education: Goal: Knowledge of cardiac device and self-care will improve 11/10/2022 0803 by Ancil Linsey, RN Outcome: Adequate for Discharge 11/10/2022 0803 by Ancil Linsey, RN Outcome: Adequate for Discharge Goal: Ability to safely manage health related needs after discharge will improve 11/10/2022 0803 by Ancil Linsey, RN Outcome: Adequate for Discharge 11/10/2022 0803 by Ancil Linsey, RN Outcome: Adequate for Discharge Goal: Individualized Educational Video(s) 11/10/2022 0803 by Ancil Linsey, RN Outcome: Adequate for Discharge 11/10/2022 0803 by Ancil Linsey, RN Outcome: Adequate for Discharge   Problem: Cardiac: Goal: Ability to achieve and maintain adequate cardiopulmonary perfusion will improve 11/10/2022 0803 by Ancil Linsey, RN Outcome: Adequate for Discharge 11/10/2022 0803 by Ancil Linsey, RN Outcome: Adequate for Discharge

## 2022-11-12 ENCOUNTER — Telehealth: Payer: Self-pay

## 2022-11-12 ENCOUNTER — Encounter (HOSPITAL_COMMUNITY): Payer: Self-pay | Admitting: Cardiology

## 2022-11-12 NOTE — Telephone Encounter (Signed)
Biotron[employer] alert received for "Slow NST" with unknown duration on remote monitor. Patient called, was unaware of any elevated rates and reports she felt well and is feeling good today. Alert timer reset to check 11/13/22. Patient advised of ER precautions and call if she has any non-urgent symptoms. Patient agreeable and voiced understanding.  Routing to Dr. Lalla Brothers as Lorain Childes.

## 2022-11-14 ENCOUNTER — Telehealth (HOSPITAL_COMMUNITY): Payer: Self-pay

## 2022-11-14 NOTE — Telephone Encounter (Signed)
Pt returned phone call and stated that she is not interested in the cardiac rehab program. Closed referral. 

## 2022-11-19 ENCOUNTER — Encounter: Payer: Self-pay | Admitting: *Deleted

## 2022-11-21 ENCOUNTER — Ambulatory Visit: Payer: BLUE CROSS/BLUE SHIELD | Attending: Internal Medicine

## 2022-11-21 DIAGNOSIS — I469 Cardiac arrest, cause unspecified: Secondary | ICD-10-CM

## 2022-11-21 DIAGNOSIS — I4901 Ventricular fibrillation: Secondary | ICD-10-CM

## 2022-11-21 NOTE — Patient Instructions (Addendum)
   After Your ICD (Implantable Cardiac Defibrillator)    Monitor your defibrillator site for redness, swelling, and drainage. Call the device clinic at [phone] if you experience these symptoms or fever/chills.  Your incision was closed with Steri-strips or staples:  You may shower 7 days after your procedure and wash your incision with soap and water. Avoid lotions, ointments, or perfumes over your incision until it is well-healed.  You may use a hot tub or a pool after your wound check appointment if the incision is completely closed.  Do not lift, push or pull greater than 10 pounds with the affected arm until Dec 21 2021. There are no other restrictions in arm movement after your wound check appointment.  Your ICD is designed to protect you from life threatening heart rhythms. Because of this, you may receive a shock.   1 shock with no symptoms:  Call the office during business hours. 1 shock with symptoms (chest pain, chest pressure, dizziness, lightheadedness, shortness of breath, overall feeling unwell):  Call 911. If you experience 2 or more shocks in 24 hours:  Call 911. If you receive a shock, you should not drive.  [place] DMV - no driving for 6 months if you receive appropriate therapy from your ICD.   Remote monitoring is used to monitor your ICD from home. This monitoring is scheduled every 91 days by our office. It allows [place] to keep an eye on the functioning of your device to ensure it is working properly. You will routinely see your Electrophysiologist annually (more often if necessary).

## 2022-11-21 NOTE — Progress Notes (Signed)
PCP: Raenette Rover PA-C Primary Cardiologist: Dr Gasper Lloyd  EP:Dr Mealor   HPI: 52 y/o [name] w/ h/o obesity s/p bariatric surgery 11/23 at [facility], reported FH of premature CAD (father MI in 74s) and FH of CHF (2 sisters),  On 11/01/22 she was found unresponsive by her boyfriend. EMS rhythm on arrival VF. Complete downtime unknown. Required ~ 23 min of CPR before ROSC. She was hypokalemic initially with QTc 511 msec. Follow up EKG post correction of hypokalemia showed normal QTc. Admitted to ICU intubated and on pressors. CT head negative. Gradually weaned off pressors. Had cath with normal coronaries. Echo EF ~ 25%. CMRI EF 22% RV EF 50%. EP consulted. Biotronik ICD placed. Started on GDMT . Discharged 11/10/2022.   Today she returns for post hospital follow up. Complaining of anxiety r/t recent hospitalization.  Says she has not been walking around much. Mild SOB with steps. Denies PND/Orthopnea. Appetite ok but she can only eat a few bites then she gets full. She has loose bowel movements 2-3 times a day which started after bariatriac surgery. No fever or chills. Weight at home 234-235 pounds. Taking all medications. Lives alone. She has 3 grown children.   Cardiac Testing   cMRI 11/2022: EF 22%, normal RV size RV EF 50%, No myocardial LGE, so no definitive evidence for prior MI, myocarditis, or infiltrative disease  Echo 10/2022   1. Global hypokinesis worse in septum and apex. Left ventricular ejection  fraction, by estimation, is 25%. The left ventricle has normal function.  The left ventricle demonstrates global hypokinesis. The left ventricular  internal cavity size was severely   dilated. Left ventricular diastolic parameters are consistent with Grade  II diastolic dysfunction (pseudonormalization). Elevated left ventricular  end-diastolic pressure.   2. Right ventricular systolic function is normal. The right ventricular  size is normal. There is normal pulmonary artery systolic pressure.    3. Left atrial size was moderately dilated.   4. The mitral valve is abnormal. Mild mitral valve regurgitation. No  evidence of mitral stenosis.    LHC 11/2022 - normal cors  Right Heart Pressures RHC Procedural Findings: Hemodynamics (mmHg) RA mean 8 RV 46/10 PA 47/28, mean 36 PCWP mean 26 LV 130/28 AO 132/78  Oxygen saturations: PA 66% AO 96%  Cardiac Output (Fick) 6.45  Cardiac Index (Fick) 2.94 PVR 1.55 WU    ROS: All systems negative except as listed in HPI, PMH and Problem List.  SH:  Social History   Socioeconomic History   Marital status: Legally Separated    Spouse name: Not on file   Number of children: Not on file   Years of education: Not on file   Highest education level: Not on file  Occupational History   Not on file  Tobacco Use   Smoking status: Never   Smokeless tobacco: Never  Vaping Use   Vaping Use: Never used  Substance and Sexual Activity   Alcohol use: No   Drug use: Never   Sexual activity: Yes    Birth control/protection: None  Other Topics Concern   Not on file  Social History Narrative   Not on file   Social Determinants of Health   Financial Resource Strain: Low Risk  (11/02/2022)   Overall Financial Resource Strain (CARDIA)    Difficulty of Paying Living Expenses: Not hard at all  Food Insecurity: No Food Insecurity (11/02/2022)   Hunger Vital Sign    Worried About Running Out of Food in the Last Year: Never  true    Ran Out of Food in the Last Year: Never true  Transportation Needs: No Transportation Needs (11/02/2022)   PRAPARE - Administrator, Civil Service (Medical): No    Lack of Transportation (Non-Medical): No  Physical Activity: Not on file  Stress: Not on file  Social Connections: Not on file  Intimate Partner Violence: Not on file    FH:  Family History  Problem Relation Age of Onset   Hypertension Other    Cancer Other    Alcohol abuse Mother    Heart disease Mother    Alcohol abuse Father     Heart disease Father    Arthritis Maternal Grandmother    Arthritis Maternal Grandfather    Arthritis Paternal Grandmother    Arthritis Paternal Grandfather     Past Medical History:  Diagnosis Date   Acid reflux    Anemia    Fibroids    Heart murmur    Knee pain    UTI (lower urinary tract infection)     Current Outpatient Medications  Medication Sig Dispense Refill   aspirin 81 MG chewable tablet Chew 1 tablet (81 mg total) by mouth daily. 30 tablet 3   carvedilol (COREG) 3.125 MG tablet Take 1 tablet (3.125 mg total) by mouth 2 (two) times daily with a meal. 60 tablet 0   dapagliflozin propanediol (FARXIGA) 10 MG TABS tablet Take 1 tablet (10 mg total) by mouth daily. 30 tablet 3   furosemide (LASIX) 40 MG tablet Take 1 tablet (40 mg total) by mouth daily. 30 tablet 3   sacubitril-valsartan (ENTRESTO) 49-51 MG Take 1 tablet by mouth 2 (two) times daily. 60 tablet 3   spironolactone (ALDACTONE) 25 MG tablet Take 1 tablet (25 mg total) by mouth at bedtime. 3 tablet 3   No current facility-administered medications for this encounter.    Vitals:   11/22/22 1347  BP: 90/62  Pulse: 69  SpO2: 100%  Weight: 106.6 kg (235 lb)   Wt Readings from Last 3 Encounters:  11/22/22 106.6 kg (235 lb)  11/10/22 107.6 kg (237 lb 3.2 oz)  06/17/22 135.2 kg (298 lb)    PHYSICAL EXAM: General:  Walked in the clinic. No resp difficulty HEENT: normal Neck: supple. JVP flat. Carotids 2+ bilaterally; no bruits. No lymphadenopathy or thryomegaly appreciated. Cor: PMI normal. Regular rate & rhythm. No rubs, gallops or murmurs. L upper chest incision Lungs: clear Abdomen: soft, nontender, nondistended. No hepatosplenomegaly. No bruits or masses. Good bowel sounds. Extremities: no cyanosis, clubbing, rash, edema Neuro: alert & orientedx3, cranial nerves grossly intact. Moves all 4 extremities w/o difficulty. Affect pleasant.   ECG: SR 70 bpm personally checked.    ASSESSMENT & PLAN: 1.  VF arrest: 10/2022 OOH VF arrest. She was hypokalemic initially with QTc 511 msec.  F/U ECG post correction of hypokalemia showed normal QTc.  SR today.  - ICD placed. Followed by Dr Nelly Laurence.  - Keep K >4 and Mg >2 - Check BMET and Mag.    2. Chronic HFrEF: Echo with EF 25%, diffuse hypokinesis, normal RV.  Cause uncertain.  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  No CAD on cath.   . Cardiac MRI with LV EF 22%, RV EF 50%, no myocardial LGE, so no definitive evidence for prior MI, myocarditis, or infiltrative disease - NYHA II  Volume status stable. Given frequent loose BMs will need to watch to make sure she does not get volume  depleted. Check BMET . GDMIT- Limited due to hypotension. Asymptomatic.  -Continue coreg 3.125 mg twice a day  -Continue Spironolactone 25 mg daily but will switch to bedtime starting tomorrow.  - Continue Entresto 49/51 mg bid . Hold tonights dose given soft BP then restart tomorrow. May need to cut back at next visit if SBP remains soft.  - Continue Farxiga 10 mg daily. - Repeat ECHO 3 months after HF meds optimized.  - Consider referring for genetic testing.   3. Obesity, S/P Bariatric Surgery 06/2022  Body mass index is 37.93 kg/m. Encouraged to start gradually increasing activity.   4. SDOH Refer to SW for coping strategies. Appreciate SW Team!   Follow up next week with Dr Gasper Lloyd next week. Greater than 50% of the (total minutes 30) visit spent in counseling/coordination of care regarding the above.     Devynn Scheff NP-C  2:24 PM

## 2022-11-21 NOTE — Progress Notes (Signed)
Wound check appointment. Steri-strips removed. Wound without redness or edema. Incision edges approximated, wound well healed. Normal device function. Thresholds, sensing, and impedances consistent with implant measurements. Device programmed at 3.5V for extra safety margin until 3 month visit. Histogram distribution appropriate for patient and level of activity. 2 HVR noted, EGMs illustrate likely SVT episodes <10sec in duration. Patient educated about wound care, arm mobility, lifting restrictions, shock plan. Patient Very tearful regarding new ICD implant/ cardiac arrest. Referral placed to psychologist

## 2022-11-22 ENCOUNTER — Ambulatory Visit (HOSPITAL_COMMUNITY)
Admit: 2022-11-22 | Discharge: 2022-11-22 | Disposition: A | Discharge status: Home / Self Care | Payer: BLUE CROSS/BLUE SHIELD | Attending: Family Medicine | Admitting: Family Medicine

## 2022-11-22 ENCOUNTER — Encounter (HOSPITAL_COMMUNITY): Payer: Self-pay

## 2022-11-22 VITALS — BP 90/62 | HR 69 | Wt 235.0 lb

## 2022-11-22 DIAGNOSIS — I5022 Chronic systolic (congestive) heart failure: Secondary | ICD-10-CM | POA: Diagnosis not present

## 2022-11-22 DIAGNOSIS — R9431 Abnormal electrocardiogram [ECG] [EKG]: Secondary | ICD-10-CM | POA: Diagnosis not present

## 2022-11-22 DIAGNOSIS — Z79899 Other long term (current) drug therapy: Secondary | ICD-10-CM | POA: Insufficient documentation

## 2022-11-22 DIAGNOSIS — I469 Cardiac arrest, cause unspecified: Secondary | ICD-10-CM | POA: Diagnosis not present

## 2022-11-22 DIAGNOSIS — E669 Obesity, unspecified: Secondary | ICD-10-CM | POA: Diagnosis not present

## 2022-11-22 DIAGNOSIS — F064 Anxiety disorder due to known physiological condition: Secondary | ICD-10-CM | POA: Diagnosis not present

## 2022-11-22 DIAGNOSIS — Z8249 Family history of ischemic heart disease and other diseases of the circulatory system: Secondary | ICD-10-CM | POA: Diagnosis not present

## 2022-11-22 DIAGNOSIS — I5042 Chronic combined systolic (congestive) and diastolic (congestive) heart failure: Secondary | ICD-10-CM

## 2022-11-22 DIAGNOSIS — Z95810 Presence of automatic (implantable) cardiac defibrillator: Secondary | ICD-10-CM | POA: Insufficient documentation

## 2022-11-22 DIAGNOSIS — I959 Hypotension, unspecified: Secondary | ICD-10-CM | POA: Diagnosis not present

## 2022-11-22 DIAGNOSIS — Z8674 Personal history of sudden cardiac arrest: Secondary | ICD-10-CM | POA: Diagnosis not present

## 2022-11-22 DIAGNOSIS — Z9884 Bariatric surgery status: Secondary | ICD-10-CM | POA: Diagnosis not present

## 2022-11-22 DIAGNOSIS — Z6837 Body mass index (BMI) 37.0-37.9, adult: Secondary | ICD-10-CM | POA: Insufficient documentation

## 2022-11-22 LAB — BASIC METABOLIC PANEL
Anion gap: 8 (ref 5–15)
BUN: 12 mg/dL (ref 6–20)
CO2: 27 mmol/L (ref 22–32)
Calcium: 10 mg/dL (ref 8.9–10.3)
Chloride: 101 mmol/L (ref 98–111)
Creatinine, Ser: 1.06 mg/dL — ABNORMAL HIGH (ref 0.44–1.00)
GFR, Estimated: >60 mL/min (ref >60)
Glucose, Bld: 112 mg/dL — ABNORMAL HIGH (ref 70–99)
Potassium: 3.7 mmol/L (ref 3.5–5.1)
Sodium: 136 mmol/L (ref 135–145)

## 2022-11-22 LAB — MAGNESIUM: Magnesium: 2.1 mg/dL (ref 1.7–2.4)

## 2022-11-22 MED ORDER — SPIRONOLACTONE 25 MG PO TABS: 25.0000 mg | ORAL_TABLET | Freq: Every evening | ORAL | 3 refills | Status: DC

## 2022-11-22 NOTE — Addendum Note (Signed)
Encounter addended by: Burna Sis, LCSW on: 11/22/2022 3:52 PM  Actions taken: Clinical Note Signed

## 2022-11-22 NOTE — Progress Notes (Signed)
H&V Care Navigation CSW Progress Note  Clinical Social Worker referred to speak with pt regarding current mental health struggles surrounding new heart failure diagnosis.  Pt reports that she did not know of heart failure prior to recent hospital stay when she had traumatic cardiac event resulting in her needing CPR for over 20 minutes.  Pt reports since this event she has been struggling to adjust to this diagnosis.    Reports she has a strong support system through family and the church- does not feel hesitant to reach out to them when she has concerns.   Is interested in talking to a mental health professional to working on coping skills and process what is going on.  Was referred to [facility] but is out of network.  CSW informed pt she should call [employer] member services to get list of in network options for therapy.    Pt also hoping she can go back to work soon- finds her work fulfilling and having to stop right now has been difficult for her mentally as well.  Pt anxious to leave clinic as she hasn't been able to eat so CSW cut assessment short- will plan to follow up with her by phone next week to check in on how she is doing and assist further if needed  SDOH Screenings   Food Insecurity: No Food Insecurity (11/02/2022)  Housing: Low Risk  (11/02/2022)  Transportation Needs: No Transportation Needs (11/02/2022)  Utilities: Not At Risk (11/02/2022)  Financial Resource Strain: Low Risk  (11/02/2022)  Tobacco Use: Low Risk  (11/22/2022)   Burna Sis, LCSW Clinical Social Worker [facility] Desk#: [phone] Cell#: [phone]

## 2022-11-22 NOTE — Patient Instructions (Signed)
Medication Changes:  HOLD Entresto for TONIGHT.   We changed spirolactone to take at bedtime.   Lab Work:  Labs done today, your results will be available in MyChart, we will contact you for abnormal readings.   Follow-Up in:   Your physician recommends that you schedule a follow-up appointment in: 1-2 weeks with Darvin Neighbours, MD.    Do the following things EVERYDAY: Weigh yourself in the morning before breakfast. Write it down and keep it in a log. Take your medicines as prescribed Eat low salt foods--Limit salt (sodium) to 2000 mg per day.  Stay as active as you can everyday Limit all fluids for the day to less than 2 liters    Need to Contact us:  If you have any questions or concerns before your next appointment please send [place] a message through [place] or call our office at [phone].    TO LEAVE A MESSAGE FOR THE NURSE SELECT OPTION 2, PLEASE LEAVE A MESSAGE INCLUDING: YOUR NAME DATE OF BIRTH CALL BACK NUMBER REASON FOR CALL**this is important as we prioritize the call backs  YOU WILL RECEIVE A CALL BACK THE SAME DAY AS LONG AS YOU CALL BEFORE 4:00 PM   At the [facility], you and your health needs are our priority. As part of our continuing mission to provide you with exceptional heart care, we have created designated Provider Care Teams. These Care Teams include your primary Cardiologist (physician) and Advanced Practice Providers (APPs- Physician Assistants and Nurse Practitioners) who all work together to provide you with the care you need, when you need it.   You may see any of the following providers on your designated Care Team at your next follow up: Dr Arvilla Meres Dr Marca Ancona Dr. Marcos Eke, NP Robbie Lis, [place] [facility] [place], [place] Brynda Peon, NP Karle Plumber, PharmD   Please be sure to bring in all your medications bottles to every appointment.    Thank you for choosing [place]  [facility]

## 2022-11-29 LAB — HM PAP SMEAR

## 2022-11-29 LAB — HM MAMMOGRAPHY

## 2022-11-29 LAB — RESULTS CONSOLE HPV: CHL HPV: NEGATIVE

## 2022-11-30 ENCOUNTER — Ambulatory Visit (HOSPITAL_COMMUNITY)
Admission: RE | Admit: 2022-11-30 | Discharge: 2022-11-30 | Disposition: A | Discharge status: Home / Self Care | Payer: BLUE CROSS/BLUE SHIELD | Source: Ambulatory Visit | Attending: Cardiology | Admitting: Cardiology

## 2022-11-30 ENCOUNTER — Other Ambulatory Visit (HOSPITAL_COMMUNITY): Payer: Self-pay | Admitting: Cardiology

## 2022-11-30 ENCOUNTER — Other Ambulatory Visit (HOSPITAL_COMMUNITY): Payer: Self-pay

## 2022-11-30 VITALS — BP 90/64 | HR 70 | Wt 237.0 lb

## 2022-11-30 DIAGNOSIS — I428 Other cardiomyopathies: Secondary | ICD-10-CM | POA: Diagnosis not present

## 2022-11-30 DIAGNOSIS — Z95810 Presence of automatic (implantable) cardiac defibrillator: Secondary | ICD-10-CM | POA: Insufficient documentation

## 2022-11-30 DIAGNOSIS — Z6838 Body mass index (BMI) 38.0-38.9, adult: Secondary | ICD-10-CM | POA: Insufficient documentation

## 2022-11-30 DIAGNOSIS — Z9884 Bariatric surgery status: Secondary | ICD-10-CM | POA: Insufficient documentation

## 2022-11-30 DIAGNOSIS — Z8249 Family history of ischemic heart disease and other diseases of the circulatory system: Secondary | ICD-10-CM | POA: Insufficient documentation

## 2022-11-30 DIAGNOSIS — I5020 Unspecified systolic (congestive) heart failure: Secondary | ICD-10-CM | POA: Insufficient documentation

## 2022-11-30 DIAGNOSIS — I5042 Chronic combined systolic (congestive) and diastolic (congestive) heart failure: Secondary | ICD-10-CM

## 2022-11-30 DIAGNOSIS — E669 Obesity, unspecified: Secondary | ICD-10-CM | POA: Diagnosis not present

## 2022-11-30 DIAGNOSIS — I4901 Ventricular fibrillation: Secondary | ICD-10-CM | POA: Insufficient documentation

## 2022-11-30 DIAGNOSIS — Z79899 Other long term (current) drug therapy: Secondary | ICD-10-CM | POA: Diagnosis not present

## 2022-11-30 MED ORDER — SPIRONOLACTONE 25 MG PO TABS: 25.0000 mg | ORAL_TABLET | Freq: Every evening | ORAL | 6 refills | Status: DC

## 2022-11-30 MED ORDER — CARVEDILOL 6.25 MG PO TABS: 3.1250 mg | ORAL_TABLET | Freq: Two times a day (BID) | ORAL | 0 refills | Status: DC

## 2022-11-30 NOTE — Patient Instructions (Addendum)
Medication Changes:  Take an extra Furosemide today and tomorrow only. Then resume your normal dosing.  Increase Carvedilol to 6.25 mg twice daily.  Lab Work:  Designer, jewellery has been done, this has to be sent to [place] to be processed and can take 1-2 weeks to get results back.  We will let you know the results.   Referrals:  We are referring you to Cardiac Rehab. They will contact you directly to set up an appointment.   Follow-Up in: Follow up in 1 month on 12/21/22 @ 2pm.  Echocardiogram is ordered and scheduled for 01/10/23 @ 10am.  At the [facility], you and your health needs are our priority. We have a designated team specialized in the treatment of Heart Failure. This Care Team includes your primary Heart Failure Specialized Cardiologist (physician), Advanced Practice Providers (APPs- Physician Assistants and Nurse Practitioners), and Pharmacist who all work together to provide you with the care you need, when you need it.   You may see any of the following providers on your designated Care Team at your next follow up:  Dr. Arvilla Meres Dr. Marca Ancona Dr. Marcos Eke, NP Robbie Lis, [place] [facility] [place], [place] Brynda Peon, NP Karle Plumber, PharmD   Please be sure to bring in all your medications bottles to every appointment.   Need to Contact us:  If you have any questions or concerns before your next appointment please send [place] a message through [place] or call our office at [phone].    TO LEAVE A MESSAGE FOR THE NURSE SELECT OPTION 2, PLEASE LEAVE A MESSAGE INCLUDING: YOUR NAME DATE OF BIRTH CALL BACK NUMBER REASON FOR CALL**this is important as we prioritize the call backs  YOU WILL RECEIVE A CALL BACK THE SAME DAY AS LONG AS YOU CALL BEFORE 4:00 PM

## 2022-11-30 NOTE — Progress Notes (Signed)
ADVANCED HEART FAILURE CLINIC NOTE  Referring Physician: Verlee Rossetti, PA-C  Primary Care: Verlee Rossetti, PA-C Primary Cardiologist: Dr. Gasper Lloyd  HPI: Karen Lutz is a 52 y.o. female with history of heart failure with reduced action fraction, obesity status post bariatric surgery in November 2023 at [facility] and a significant family history of early systolic heart failure that presents today to establish care.  Her cardiac history dates back to November 01, 2022 when she was found unresponsive by her boyfriend.  Her rhythm on arrival was ventricular fibrillation with an unknown downtime.  She underwent CPR for 23 minutes before ROSC.  On arrival she was found to be hypokalemic with a QTc of 511 ms with normalization after correction of hypokalemia.  She was intubated and placed on pressors with echocardiogram demonstrating EF of 25%, cardiac MRI with a EF of 22% and preserved RV function.  She had a Biotronik secondary prevention ICD placed was started on GDMT and discharged home 1 week later.   Interval history Discharged from the hospital she has been seen in [facility] clinic where GDMT was further uptitrated.  From a functional standpoint she is doing remarkably well.  She does have some mild shortness of breath with moderate exertion but performs ADLs independently.  She lives alone currently but has 3 grown children nearby.  She does have some difficulty with cognition and memory after her code which is very slowly improving.  Activity level/exercise tolerance: NYHA IIb Orthopnea:  Sleeps on 2 pillows Paroxysmal noctural dyspnea:  no Chest pain/pressure:  no Orthostatic lightheadedness:  no Palpitations:  no Lower extremity edema:  no Presyncope/syncope:  no Cough:  no  Past Medical History:  Diagnosis Date   Acid reflux    Anemia    Fibroids    Heart murmur    Knee pain    UTI (lower urinary tract infection)     Current Outpatient Medications   Medication Sig Dispense Refill   aspirin 81 MG chewable tablet Chew 1 tablet (81 mg total) by mouth daily. 30 tablet 3   carvedilol (COREG) 3.125 MG tablet Take 1 tablet (3.125 mg total) by mouth 2 (two) times daily with a meal. 60 tablet 0   dapagliflozin propanediol (FARXIGA) 10 MG TABS tablet Take 1 tablet (10 mg total) by mouth daily. 30 tablet 3   furosemide (LASIX) 40 MG tablet Take 1 tablet (40 mg total) by mouth daily. 30 tablet 3   sacubitril-valsartan (ENTRESTO) 49-51 MG Take 1 tablet by mouth 2 (two) times daily. 60 tablet 3   spironolactone (ALDACTONE) 25 MG tablet Take 1 tablet (25 mg total) by mouth at bedtime. 3 tablet 3   No current facility-administered medications for this encounter.    Allergies  Allergen Reactions   Penicillins Rash    Has patient had a PCN reaction causing immediate rash, facial/tongue/throat swelling, SOB or lightheadedness with hypotension: unknown Has patient had a PCN reaction causing severe rash involving mucus membranes or skin necrosis: unknown Has patient had a PCN reaction that required hospitalization : yes Has patient had a PCN reaction occurring within the last 10 years: no If all of the above answers are "NO", then may proceed with Cephalosporin use.    Penicillin G Other (See Comments)    Per patient, childhood allergy, reaction unknown.        Social History   Socioeconomic History   Marital status: Legally Separated    Spouse name: Not on file  Number of children: Not on file   Years of education: Not on file   Highest education level: Not on file  Occupational History   Not on file  Tobacco Use   Smoking status: Never   Smokeless tobacco: Never  Vaping Use   Vaping Use: Never used  Substance and Sexual Activity   Alcohol use: No   Drug use: Never   Sexual activity: Yes    Birth control/protection: None  Other Topics Concern   Not on file  Social History Narrative   Not on file   Social Determinants of Health    Financial Resource Strain: Low Risk  (11/02/2022)   Overall Financial Resource Strain (CARDIA)    Difficulty of Paying Living Expenses: Not hard at all  Food Insecurity: No Food Insecurity (11/02/2022)   Hunger Vital Sign    Worried About Running Out of Food in the Last Year: Never true    Ran Out of Food in the Last Year: Never true  Transportation Needs: No Transportation Needs (11/02/2022)   PRAPARE - Administrator, Civil Service (Medical): No    Lack of Transportation (Non-Medical): No  Physical Activity: Not on file  Stress: Not on file  Social Connections: Not on file  Intimate Partner Violence: Not on file      Family History  Problem Relation Age of Onset   Hypertension Other    Cancer Other    Alcohol abuse Mother    Heart disease Mother    Alcohol abuse Father    Heart disease Father    Arthritis Maternal Grandmother    Arthritis Maternal Grandfather    Arthritis Paternal Grandmother    Arthritis Paternal Grandfather     PHYSICAL EXAM: Vitals:   11/30/22 1157  BP: 90/64  Pulse: 70  SpO2: 99%   GENERAL: Well nourished, well developed, and in no apparent distress at rest.  HEENT: Negative for arcus senilis or xanthelasma. There is no scleral icterus.  The mucous membranes are pink and moist.   NECK: Supple, No masses. Normal carotid upstrokes without bruits. No masses or thyromegaly.    CHEST: There are no chest wall deformities. There is no chest wall tenderness. Respirations are unlabored.  Lungs- CTA B/L CARDIAC:  JVP: 7 cm H2O         Normal S1, S2  Normal rate with regular rhythm. No murmurs, rubs or gallops.  Pulses are 2+ and symmetrical in upper and lower extremities. no edema.  ABDOMEN: Soft, non-tender, non-distended. There are no masses or hepatomegaly. There are normal bowel sounds.  EXTREMITIES: Warm and well perfused with no cyanosis, clubbing.  LYMPHATIC: No axillary or supraclavicular lymphadenopathy.  NEUROLOGIC: Patient is  oriented x3 with no focal or lateralizing neurologic deficits.  PSYCH: Patients affect is appropriate, there is no evidence of anxiety or depression.  SKIN: Warm and dry; no lesions or wounds.   DATA REVIEW  ECG: 18/24: Normal sinus rhythm as per my personal interpretation  ECHO: 10/12/22: LVEF 25%, grade 2 diastolic dysfunction, normal RV function as per my personal interpretation  CATH: 1.Normal coronaries 2. Elevated filling pressures.  3. Mild pulmonary venous hypertension.  4. Preserved cardiac output.  1 point  CMR: 11/07/22: 1.  Moderately dilated LV with global hypokinesis, EF 22%.  2.  Normal RV size with EF 50%.  3. No myocardial LGE, so no definitive evidence for prior MI, myocarditis, or infiltrative disease.  ASSESSMENT & PLAN:  Heart failure with reduced ejection fraction  Etiology of HF: Nonischemic cardiomyopathy with cardiac MRI not demonstrating any infiltrative cardiomyopathy or LGE.  I spoke with her family while inpatient and they commented on significant family history of heart failure and multiple family members before the age of 59-60.  They may possibly have 1 family member with sudden cardiac arrest.  Will obtain genetic testing today. NYHA class / AHA Stage: NYHA II Volume status & Diuretics: Euvolemic continue Lasix 40 mg daily Vasodilators: Entresto 49/51 mg twice daily Beta-Blocker: Increase Coreg to 6.25 mg twice daily MRA: Start spironolactone 25 mg daily.  Repeat labs Cardiometabolic: Farxiga 10 mg Devices therapies & Valvulopathies: Secondary to an ICD in place Advanced therapies: Will plan to repeat echocardiogram in 2 months.  2.  V-fib arrest -No etiology determined at this point.  EP following.  Hypokalemic on arrival during her arrest.  Will obtain genetic screening today.  3.  Obesity -BMI 38 -Discussed importance of weight loss. -Will refer to healthy weight loss clinic at follow-up.   Dominick Morella [facility] Mechanical Circulatory Support

## 2022-11-30 NOTE — Progress Notes (Signed)
H&V Care Navigation CSW Progress Note  Clinical Social Worker met with pt in clinic to check in.  States she is doing well today but hasn't followed up regarding counseling at this time  CSW provided with general list of local mental health providers but encouraged her to reach out to insurance to get list of in network providers.  Pt reports no further needs at this time   SDOH Screenings   Food Insecurity: No Food Insecurity (11/02/2022)  Housing: Low Risk  (11/02/2022)  Transportation Needs: No Transportation Needs (11/02/2022)  Utilities: Not At Risk (11/02/2022)  Financial Resource Strain: Low Risk  (11/02/2022)  Tobacco Use: Low Risk  (11/22/2022)   Burna Sis, LCSW Clinical Social Worker [facility] Desk#: [phone] Cell#: [phone]

## 2022-12-01 ENCOUNTER — Other Ambulatory Visit (HOSPITAL_COMMUNITY): Payer: Self-pay | Admitting: Cardiology

## 2022-12-07 ENCOUNTER — Other Ambulatory Visit (HOSPITAL_COMMUNITY): Payer: Self-pay

## 2022-12-07 MED ORDER — FUROSEMIDE 40 MG PO TABS: 40.0000 mg | ORAL_TABLET | Freq: Every day | ORAL | 3 refills | Status: DC

## 2022-12-07 MED ORDER — SACUBITRIL-VALSARTAN 49-51 MG PO TABS: 1.0000 | ORAL_TABLET | Freq: Two times a day (BID) | ORAL | 3 refills | Status: DC

## 2022-12-07 MED ORDER — CARVEDILOL 6.25 MG PO TABS: ORAL_TABLET | ORAL | 3 refills | Status: DC

## 2022-12-07 MED ORDER — SPIRONOLACTONE 25 MG PO TABS: 25.0000 mg | ORAL_TABLET | Freq: Every evening | ORAL | 6 refills | Status: DC

## 2022-12-07 MED ORDER — DAPAGLIFLOZIN PROPANEDIOL 10 MG PO TABS: 10.0000 mg | ORAL_TABLET | Freq: Every day | ORAL | 3 refills | Status: DC

## 2022-12-11 ENCOUNTER — Telehealth (HOSPITAL_COMMUNITY): Payer: Self-pay

## 2022-12-11 NOTE — Telephone Encounter (Signed)
Patient states she has had an insurance issue, where she does not have any right now. Her Sherryll Burger and Marcelline Deist are more than she can afford. Can you help with her?

## 2022-12-12 ENCOUNTER — Telehealth (HOSPITAL_COMMUNITY): Payer: Self-pay

## 2022-12-12 ENCOUNTER — Other Ambulatory Visit (HOSPITAL_COMMUNITY): Payer: Self-pay

## 2022-12-12 ENCOUNTER — Telehealth (HOSPITAL_COMMUNITY): Payer: Self-pay | Admitting: Pharmacy Technician

## 2022-12-12 ENCOUNTER — Telehealth: Payer: Self-pay

## 2022-12-12 NOTE — Telephone Encounter (Signed)
She called this morning to follow up.

## 2022-12-12 NOTE — Telephone Encounter (Signed)
Advanced Heart Failure Patient Advocate Encounter  Returned patient call, pt is having issues with insurance for this month due to an investigation / change of plan. They are working to resolve this issue, but do not have an estimated resolution date at this time. I have reached out to have samples of Entresto and Marcelline Deist available for the patient to pick up, as she is almost out of medication.  Burnell Blanks, CPhT Rx Patient Advocate Phone: [phone]

## 2022-12-12 NOTE — Telephone Encounter (Signed)
Medication Samples have been provided to the patient.  Drug name: Sherryll Burger       Strength: 49-51mg         Qty: 2 bottles  LOT: [id number]  Exp.Date: 02/2024  Dosing instructions: Take 1 tablet twice daily.  The patient has been instructed regarding the correct time, dose, and frequency of taking this medication, including desired effects and most common side effects.   Allen Kell [facility] 10:23 AM 12/12/2022

## 2022-12-12 NOTE — Telephone Encounter (Signed)
Spoke with Pt.  She complains of soreness at ICD implant site.  ICD placed 1 month ago.  She thinks it might be related to sleeping on her left side.  Per Pt site is well healed.  No bruising/swelling.  She denies fever/chills.  Advised some continuing soreness is normal.  Recommended she avoid sleeping on her left side if possible while site is healing.  All questions answered.  She will call if any further concerns.

## 2022-12-21 ENCOUNTER — Ambulatory Visit (HOSPITAL_COMMUNITY)
Admission: RE | Admit: 2022-12-21 | Discharge: 2022-12-21 | Disposition: A | Discharge status: Home / Self Care | Payer: BLUE CROSS/BLUE SHIELD | Source: Ambulatory Visit | Attending: Cardiology | Admitting: Cardiology

## 2022-12-21 ENCOUNTER — Encounter (HOSPITAL_COMMUNITY): Payer: Self-pay | Admitting: Cardiology

## 2022-12-21 VITALS — BP 90/50 | HR 62 | Wt 231.4 lb

## 2022-12-21 DIAGNOSIS — Z79899 Other long term (current) drug therapy: Secondary | ICD-10-CM | POA: Diagnosis not present

## 2022-12-21 DIAGNOSIS — Z6838 Body mass index (BMI) 38.0-38.9, adult: Secondary | ICD-10-CM | POA: Insufficient documentation

## 2022-12-21 DIAGNOSIS — I469 Cardiac arrest, cause unspecified: Secondary | ICD-10-CM | POA: Diagnosis not present

## 2022-12-21 DIAGNOSIS — I4901 Ventricular fibrillation: Secondary | ICD-10-CM | POA: Insufficient documentation

## 2022-12-21 DIAGNOSIS — I5042 Chronic combined systolic (congestive) and diastolic (congestive) heart failure: Secondary | ICD-10-CM | POA: Diagnosis not present

## 2022-12-21 DIAGNOSIS — R079 Chest pain, unspecified: Secondary | ICD-10-CM | POA: Insufficient documentation

## 2022-12-21 DIAGNOSIS — E876 Hypokalemia: Secondary | ICD-10-CM | POA: Insufficient documentation

## 2022-12-21 DIAGNOSIS — Z95810 Presence of automatic (implantable) cardiac defibrillator: Secondary | ICD-10-CM | POA: Insufficient documentation

## 2022-12-21 DIAGNOSIS — I428 Other cardiomyopathies: Secondary | ICD-10-CM | POA: Diagnosis not present

## 2022-12-21 DIAGNOSIS — I5020 Unspecified systolic (congestive) heart failure: Secondary | ICD-10-CM | POA: Diagnosis not present

## 2022-12-21 DIAGNOSIS — Z9884 Bariatric surgery status: Secondary | ICD-10-CM | POA: Diagnosis not present

## 2022-12-21 DIAGNOSIS — E669 Obesity, unspecified: Secondary | ICD-10-CM | POA: Diagnosis present

## 2022-12-21 LAB — BASIC METABOLIC PANEL
Anion gap: 7 (ref 5–15)
BUN: 7 mg/dL (ref 6–20)
CO2: 26 mmol/L (ref 22–32)
Calcium: 9.9 mg/dL (ref 8.9–10.3)
Chloride: 105 mmol/L (ref 98–111)
Creatinine, Ser: 1.17 mg/dL — ABNORMAL HIGH (ref 0.44–1.00)
GFR, Estimated: 56 mL/min — ABNORMAL LOW (ref >60)
Glucose, Bld: 89 mg/dL (ref 70–99)
Potassium: 3.9 mmol/L (ref 3.5–5.1)
Sodium: 138 mmol/L (ref 135–145)

## 2022-12-21 LAB — BRAIN NATRIURETIC PEPTIDE: B Natriuretic Peptide: 156.1 pg/mL — ABNORMAL HIGH (ref 0.0–100.0)

## 2022-12-21 NOTE — Progress Notes (Signed)
ADVANCED HEART FAILURE CLINIC NOTE  Referring Physician: Verlee Rossetti, PA-C  Primary Care: Verlee Rossetti, PA-C Primary Cardiologist: Dr. Gasper Lloyd  HPI: Karen Lutz is a 52 y.o. female with history of heart failure with reduced action fraction, obesity status post bariatric surgery in November 2023 at [facility] and a significant family history of early systolic heart failure that presents today to establish care.  Her cardiac history dates back to November 01, 2022 when she was found unresponsive by her boyfriend.  Her rhythm on arrival was ventricular fibrillation with an unknown downtime.  She underwent CPR for 23 minutes before ROSC.  On arrival she was found to be hypokalemic with a QTc of 511 ms with normalization after correction of hypokalemia.  She was intubated and placed on pressors with echocardiogram demonstrating EF of 25%, cardiac MRI with a EF of 22% and preserved RV function.  She had a Biotronik secondary prevention ICD placed was started on GDMT and discharged home 1 week later.   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 She reports no shortness of breath now; no longer having LE edema, no chest pain other than soreness at the ICD implant site.    Activity level/exercise tolerance: NYHA IIb Orthopnea:  Sleeps on 2 pillows Paroxysmal noctural dyspnea:  no Chest pain/pressure:  no Orthostatic lightheadedness:  no Palpitations:  no Lower extremity edema:  no Presyncope/syncope:  no Cough:  no  Past Medical History:  Diagnosis Date   Acid reflux    Anemia    Fibroids    Heart murmur    Knee pain    UTI (lower urinary tract infection)     Current Outpatient Medications  Medication Sig Dispense Refill   aspirin 81 MG chewable tablet Chew 1 tablet (81 mg total) by mouth daily. 30 tablet 3   carvedilol (COREG) 6.25 MG tablet TAKE 1/2 TABLET(3.125 MG) BY MOUTH  TWICE DAILY WITH A MEAL 180 tablet 3   dapagliflozin propanediol (FARXIGA) 10 MG TABS tablet Take 1 tablet (10 mg total) by mouth daily. 30 tablet 3   furosemide (LASIX) 40 MG tablet Take 1 tablet (40 mg total) by mouth daily. 30 tablet 3   sacubitril-valsartan (ENTRESTO) 49-51 MG Take 1 tablet by mouth 2 (two) times daily. 60 tablet 3   spironolactone (ALDACTONE) 25 MG tablet Take 1 tablet (25 mg total) by mouth at bedtime. 30 tablet 6   No current facility-administered medications for this visit.    Allergies  Allergen Reactions   Penicillins Rash    Has patient had a PCN reaction causing immediate rash, facial/tongue/throat swelling, SOB or lightheadedness with hypotension: unknown Has patient had a PCN reaction causing severe rash involving mucus membranes or skin necrosis: unknown Has patient had a PCN reaction that required hospitalization : yes Has patient had a PCN reaction occurring within the last 10 years: no If all of the above answers are "NO", then may proceed with Cephalosporin use.    Penicillin G Other (See Comments)    Per patient, childhood allergy, reaction unknown.        Social History   Socioeconomic History   Marital status: Legally Separated    Spouse name: Not on file   Number of children: Not on file   Years of education: Not on file   Highest education level: Not on file  Occupational History   Not on  file  Tobacco Use   Smoking status: Never   Smokeless tobacco: Never  Vaping Use   Vaping Use: Never used  Substance and Sexual Activity   Alcohol use: No   Drug use: Never   Sexual activity: Yes    Birth control/protection: None  Other Topics Concern   Not on file  Social History Narrative   Not on file   Social Determinants of Health   Financial Resource Strain: Low Risk  (11/02/2022)   Overall Financial Resource Strain (CARDIA)    Difficulty of Paying Living Expenses: Not hard at all  Food Insecurity: No Food Insecurity (11/02/2022)    Hunger Vital Sign    Worried About Running Out of Food in the Last Year: Never true    Ran Out of Food in the Last Year: Never true  Transportation Needs: No Transportation Needs (11/02/2022)   PRAPARE - Administrator, Civil Service (Medical): No    Lack of Transportation (Non-Medical): No  Physical Activity: Not on file  Stress: Not on file  Social Connections: Not on file  Intimate Partner Violence: Not on file      Family History  Problem Relation Age of Onset   Hypertension Other    Cancer Other    Alcohol abuse Mother    Heart disease Mother    Alcohol abuse Father    Heart disease Father    Arthritis Maternal Grandmother    Arthritis Maternal Grandfather    Arthritis Paternal Grandmother    Arthritis Paternal Grandfather     PHYSICAL EXAM: Vitals:   12/21/22 1353  BP: (!) 90/50  Pulse: 62  SpO2: 99%   GENERAL: Well nourished, well developed, and in no apparent distress at rest.  HEENT: Negative for arcus senilis or xanthelasma. There is no scleral icterus.  The mucous membranes are pink and moist.   NECK: Supple, No masses. Normal carotid upstrokes without bruits. No masses or thyromegaly.    CHEST: There are no chest wall deformities. There is no chest wall tenderness. Respirations are unlabored.  Lungs-CTA bilaterally CARDIAC:  JVP: 7 cm          Normal rate with regular rhythm. No murmurs, rubs or gallops.  Pulses are 2+ and symmetrical in upper and lower extremities.  No edema.  ABDOMEN: Soft, non-tender, non-distended. There are no masses or hepatomegaly. There are normal bowel sounds.  EXTREMITIES: Warm and well perfused with no cyanosis, clubbing.  LYMPHATIC: No axillary or supraclavicular lymphadenopathy.  NEUROLOGIC: Patient is oriented x3 with no focal or lateralizing neurologic deficits.  PSYCH: Patients affect is appropriate, there is no evidence of anxiety or depression.  SKIN: Warm and dry; no lesions or wounds.    DATA  REVIEW  ECG: 18/24: Normal sinus rhythm as per my personal interpretation  ECHO: 10/12/22: LVEF 25%, grade 2 diastolic dysfunction, normal RV function as per my personal interpretation  CATH: 1.Normal coronaries 2. Elevated filling pressures.  3. Mild pulmonary venous hypertension.  4. Preserved cardiac output.  1 point  CMR: 11/07/22: 1.  Moderately dilated LV with global hypokinesis, EF 22%.  2.  Normal RV size with EF 50%.  3. No myocardial LGE, so no definitive evidence for prior MI, myocarditis, or infiltrative disease.  ASSESSMENT & PLAN:  Heart failure with reduced ejection fraction Etiology of HF: Nonischemic cardiomyopathy with cardiac MRI not demonstrating any infiltrative cardiomyopathy or LGE.  I spoke with her family while inpatient and they commented on significant family history of  heart failure and multiple family members before the age of 41-60.  They may possibly have 1 family member with sudden cardiac arrest.  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  Will refer to geneticist for further evaluation. NYHA class / AHA Stage: NYHA II Volume status & Diuretics: Euvolemic continue Lasix 40 mg daily Vasodilators: Entresto 49/51 mg twice daily Beta-Blocker: continue Coreg to 6.25 mg twice daily MRA: Start spironolactone 25 mg daily.  Repeat labs today Cardiometabolic: Farxiga 10 mg Devices therapies & Valvulopathies: Secondary to an ICD in place Advanced therapies: Continue GDMT; currently NYHA II. Repeat TTE in 32m. Start cardiac rehab.   2.  V-fib arrest -No etiology determined at this point.  EP following.  Hypokalemic on arrival during her arrest.  Will obtain genetic screening today. -JUP gene positive; associated with ARVC  3.  Obesity -BMI 38 -Discussed importance of weight loss. -Will refer to healthy weight loss clinic at follow-up.  4. Chest pain  - Reports having pain at ICD site; feels that it is swelling - Will obtain  CXR -On my exam no pain with palpation or erythema/swelling at ICD site.   Karen Lutz Advanced Heart Failure Mechanical Circulatory Support

## 2022-12-21 NOTE — Patient Instructions (Addendum)
There has been no changes to your medications.  Labs done today, your results will be available in MyChart, we will contact you for abnormal readings.  Your physician has requested that you have an echocardiogram. Echocardiography is a painless test that uses sound waves to create images of your heart. It provides your doctor with information about the size and shape of your heart and how well your heart's chambers and valves are working. This procedure takes approximately one hour. There are no restrictions for this procedure. Please do NOT wear cologne, perfume, aftershave, or lotions (deodorant is allowed). Please arrive 15 minutes prior to your appointment time.  Your physician recommends that you schedule a follow-up appointment in: 2 months  If you have any questions or concerns before your next appointment please send [place] a message through [place] or call our office at [phone].    TO LEAVE A MESSAGE FOR THE NURSE SELECT OPTION 2, PLEASE LEAVE A MESSAGE INCLUDING: YOUR NAME DATE OF BIRTH CALL BACK NUMBER REASON FOR CALL**this is important as we prioritize the call backs  YOU WILL RECEIVE A CALL BACK THE SAME DAY AS LONG AS YOU CALL BEFORE 4:00 PM  At the Advanced [facility] Failure Clinic, you and your health needs are our priority. As part of our continuing mission to provide you with exceptional heart care, we have created designated Provider Care Teams. These Care Teams include your primary Cardiologist (physician) and Advanced Practice Providers (APPs- Physician Assistants and Nurse Practitioners) who all work together to provide you with the care you need, when you need it.   You may see any of the following providers on your designated Care Team at your next follow up: Dr Arvilla Meres Dr Marca Ancona Dr. Marcos Eke, NP Robbie Lis, [place] [facility] [place], [place] Brynda Peon, NP Karle Plumber, PharmD   Please be sure to bring in all your  medications bottles to every appointment.    Thank you for choosing [facility]

## 2022-12-28 ENCOUNTER — Ambulatory Visit: Payer: BLUE CROSS/BLUE SHIELD | Admitting: Genetic Counselor

## 2023-01-03 ENCOUNTER — Other Ambulatory Visit (HOSPITAL_COMMUNITY): Payer: Self-pay

## 2023-01-03 MED ORDER — DAPAGLIFLOZIN PROPANEDIOL 10 MG PO TABS: 10.0000 mg | ORAL_TABLET | Freq: Every day | ORAL | 3 refills | Status: DC

## 2023-01-07 ENCOUNTER — Encounter (HOSPITAL_COMMUNITY): Payer: Self-pay

## 2023-01-07 ENCOUNTER — Telehealth (HOSPITAL_COMMUNITY): Payer: Self-pay

## 2023-01-07 NOTE — Telephone Encounter (Signed)
Attempted to call patient in regards to Cardiac Rehab - LM on VM Mailed letter 

## 2023-01-09 ENCOUNTER — Telehealth (HOSPITAL_COMMUNITY): Payer: Self-pay | Admitting: Cardiology

## 2023-01-09 ENCOUNTER — Other Ambulatory Visit (HOSPITAL_COMMUNITY): Payer: Self-pay | Admitting: Cardiology

## 2023-01-09 NOTE — Telephone Encounter (Signed)
Patient called with concerns of active chest pains and increased SOB Reports pain comes and goes-very sharp while active Symptoms started this morning Denies-jaw pain/arm pain or HA  CHEST PAIN ACTIVE DURING THE TIME OF THE CALL  Advised to report to nearest ER for further evaluation    Message to provider as Lorain Childes

## 2023-01-10 ENCOUNTER — Ambulatory Visit (HOSPITAL_COMMUNITY): Payer: BLUE CROSS/BLUE SHIELD

## 2023-01-14 ENCOUNTER — Other Ambulatory Visit (HOSPITAL_COMMUNITY): Payer: Self-pay | Admitting: Cardiology

## 2023-01-14 MED ORDER — CARVEDILOL 6.25 MG PO TABS: 6.2500 mg | ORAL_TABLET | Freq: Two times a day (BID) | ORAL | 6 refills | Status: DC

## 2023-01-23 ENCOUNTER — Telehealth (HOSPITAL_COMMUNITY): Payer: Self-pay

## 2023-01-23 ENCOUNTER — Encounter (HOSPITAL_COMMUNITY): Payer: Self-pay

## 2023-01-23 NOTE — Telephone Encounter (Signed)
Pt insurance is active and benefits verified through [employer] Co-pay 0, DED $700/0 met, out of pocket $3,000/0 met, co-insurance 30%. no pre-authorization required, Mike/[employer] 01/23/2023@10 :26, REF# [id number]   How many CR sessions are covered? (for ICR)no limit Is this a lifetime maximum or an annual maximum? annual Has the member used any of these services to date? no Is there a time limit (weeks/months) on start of program and/or program completion? no     Will contact patient to see if she is interested in the Cardiac Rehab Program. If interested, patient will need to complete follow up appt. Once completed, patient will be contacted for scheduling upon review by the RN Navigator.

## 2023-01-23 NOTE — Telephone Encounter (Signed)
Patient called and was interested in participating in the Cardiac Rehab Program. Patient will come in for orientation on 6/20@1 :15 and will attend the 1:45 exercise class.   Sent package.

## 2023-01-23 NOTE — Telephone Encounter (Signed)
Reviewed with patient the Cardiac Rehab Cardiac Risk Prolife Nursing Assessment. Patient knows office location, and to wear comfortable clothing/closed-toed shoes. Recommended to patient to eat, take medications, and if diabetic, to check blood sugar before coming to classes. Explained orientation may take 2 hours.  

## 2023-01-24 ENCOUNTER — Encounter (HOSPITAL_COMMUNITY)
Admission: RE | Admit: 2023-01-24 | Discharge: 2023-01-24 | Disposition: A | Discharge status: Home / Self Care | Payer: BC Managed Care – PPO | Source: Ambulatory Visit | Attending: Cardiology | Admitting: Cardiology

## 2023-01-24 ENCOUNTER — Telehealth (HOSPITAL_COMMUNITY): Payer: Self-pay | Admitting: Cardiology

## 2023-01-24 ENCOUNTER — Telehealth (HOSPITAL_COMMUNITY): Payer: Self-pay | Admitting: Licensed Clinical Social Worker

## 2023-01-24 ENCOUNTER — Other Ambulatory Visit (HOSPITAL_COMMUNITY): Payer: Self-pay

## 2023-01-24 VITALS — BP 89/58 | HR 63 | Ht 65.75 in | Wt 226.2 lb

## 2023-01-24 DIAGNOSIS — I428 Other cardiomyopathies: Secondary | ICD-10-CM | POA: Diagnosis present

## 2023-01-24 MED ORDER — SPIRONOLACTONE 25 MG PO TABS: 12.5000 mg | ORAL_TABLET | Freq: Every evening | ORAL | 6 refills | Status: DC

## 2023-01-24 NOTE — Telephone Encounter (Signed)
Pt aware  And Karen Lutz with cardiac rehab aware

## 2023-01-24 NOTE — Telephone Encounter (Signed)
Pt presented for cardiac rehab and bp 89/58 Pt did take meds and is asymptomatic   Maria,RN with rehab -pushing po fluids -will attempt work out today if SBP>90 (walk test)   Will need workout parameters to continue in the future.

## 2023-01-24 NOTE — Progress Notes (Signed)
Cardiac Individual Treatment Plan  Patient Details  Name: Karen Lutz MRN: [id number] Date of Birth: 01-17-1971 Referring Provider:   Flowsheet Row INTENSIVE CARDIAC REHAB ORIENT from 01/24/2023 in [facility]  Referring Provider Dorthula Nettles, DO       Initial Encounter Date:  Flowsheet Row INTENSIVE CARDIAC REHAB ORIENT from 01/24/2023 in [facility]  Date 01/24/23       Visit Diagnosis: 11/01/22 V fib Arrest  NICM  Patient's Home Medications on Admission:  Current Outpatient Medications:    aspirin 81 MG chewable tablet, Chew 1 tablet (81 mg total) by mouth daily., Disp: 30 tablet, Rfl: 3   carvedilol (COREG) 6.25 MG tablet, Take 1 tablet (6.25 mg total) by mouth 2 (two) times daily with a meal., Disp: 60 tablet, Rfl: 6   dapagliflozin propanediol (FARXIGA) 10 MG TABS tablet, Take 1 tablet (10 mg total) by mouth daily., Disp: 30 tablet, Rfl: 3   furosemide (LASIX) 40 MG tablet, Take 1 tablet (40 mg total) by mouth daily., Disp: 30 tablet, Rfl: 3   omeprazole (PRILOSEC OTC) 20 MG tablet, Take 20 mg by mouth daily. Check with Dr about getting prescription strength omperazole, Disp: , Rfl:    sacubitril-valsartan (ENTRESTO) 49-51 MG, Take 1 tablet by mouth 2 (two) times daily., Disp: 60 tablet, Rfl: 3   spironolactone (ALDACTONE) 25 MG tablet, Take 0.5 tablets (12.5 mg total) by mouth at bedtime., Disp: 15 tablet, Rfl: 6  Past Medical History: Past Medical History:  Diagnosis Date   Acid reflux    Anemia    Fibroids    Heart murmur    Knee pain    UTI (lower urinary tract infection)     Tobacco Use: Social History   Tobacco Use  Smoking Status Never  Smokeless Tobacco Never    Labs: Review Flowsheet  More data exists      Latest Ref Rng & Units 11/02/2022 11/03/2022 11/04/2022 11/05/2022 11/06/2022  Labs for ITP Cardiac and Pulmonary Rehab   Hemoglobin A1c 4.8 - 5.6 % 5.1  - - - -  PH, Arterial 7.35 - 7.45 7.392  - - - -  PCO2 arterial 32 - 48 mmHg 36.8  - - - -  Bicarbonate 20.0 - 28.0 mmol/L 20.0 - 28.0 mmol/L 22.4  - - - 26.8  27.8   TCO2 22 - 32 mmol/L 22 - 32 mmol/L 23  - - - 28  29   Acid-base deficit 0.0 - 2.0 mmol/L 2.0  - - - -  O2 Saturation % % 76.3  100  74.7  53.5  80.6  74.2  66  66     Capillary Blood Glucose: Lab Results  Component Value Date   GLUCAP 96 11/08/2022   GLUCAP 78 11/08/2022   GLUCAP 97 11/08/2022   GLUCAP 77 11/08/2022   GLUCAP 87 11/08/2022     Exercise Target Goals: Exercise Program Goal: Individual exercise prescription set using results from initial 6 min walk test and THRR while considering  patient's activity barriers and safety.   Exercise Prescription Goal: Initial exercise prescription builds to 30-45 minutes a day of aerobic activity, 2-3 days per week.  Home exercise guidelines will be given to patient during program as part of exercise prescription that the participant will acknowledge.  Activity Barriers & Risk Stratification:  Activity Barriers & Cardiac Risk Stratification - 01/24/23 1526  Activity Barriers & Cardiac Risk Stratification   Activity Barriers Deconditioning;Decreased Ventricular Function;Balance Concerns   single leg balance test 5.82 s   Cardiac Risk Stratification High   <5 METs on            6 Minute Walk:  6 Minute Walk     Row Name 01/24/23 1525         6 Minute Walk   Phase Initial     Distance 1008 feet     Walk Time 6 minutes     # of Rest Breaks 0     MPH 1.91     METS 2.56     RPE 7     Perceived Dyspnea  0     VO2 Peak 8.94     Symptoms No     Resting HR 63 bpm     Resting BP 103/70     Resting Oxygen Saturation  100 %     Exercise Oxygen Saturation  during 6 min walk 100 %     Max Ex. HR 89 bpm     Max Ex. BP 97/56     2 Minute Post BP 94/64              Oxygen Initial Assessment:   Oxygen  Re-Evaluation:   Oxygen Discharge (Final Oxygen Re-Evaluation):   Initial Exercise Prescription:  Initial Exercise Prescription - 01/24/23 1500       Date of Initial Exercise RX and Referring Provider   Date 01/24/23    Referring Provider Dorthula Nettles, DO    Expected Discharge Date 04/05/23      Recumbant Bike   Level 1    RPM 50    Watts 18    Minutes 15    METs 2      NuStep   Level 1    SPM 65    Minutes 15    METs 2      Prescription Details   Frequency (times per week) 3    Duration Progress to 30 minutes of continuous aerobic without signs/symptoms of physical distress      Intensity   THRR 40-80% of Max Heartrate 68-135    Ratings of Perceived Exertion 11-13    Perceived Dyspnea 0-4      Progression   Progression Continue progressive overload as per policy without signs/symptoms or physical distress.      Resistance Training   Training Prescription Yes    Weight 2    Reps 10-15             Perform Capillary Blood Glucose checks as needed.  Exercise Prescription Changes:   Exercise Comments:   Exercise Goals and Review:   Exercise Goals     Row Name 01/24/23 1527             Exercise Goals   Increase Physical Activity Yes       Intervention Provide advice, education, support and counseling about physical activity/exercise needs.;Develop an individualized exercise prescription for aerobic and resistive training based on initial evaluation findings, risk stratification, comorbidities and participant's personal goals.       Expected Outcomes Short Term: Attend rehab on a regular basis to increase amount of physical activity.;Long Term: Add in home exercise to make exercise part of routine and to increase amount of physical activity.;Long Term: Exercising regularly at least 3-5 days a week.       Increase Strength and Stamina Yes  Intervention Develop an individualized exercise prescription for aerobic and resistive training based  on initial evaluation findings, risk stratification, comorbidities and participant's personal goals.;Provide advice, education, support and counseling about physical activity/exercise needs.       Expected Outcomes Short Term: Increase workloads from initial exercise prescription for resistance, speed, and METs.;Short Term: Perform resistance training exercises routinely during rehab and add in resistance training at home;Long Term: Improve cardiorespiratory fitness, muscular endurance and strength as measured by increased METs and functional capacity ( )       Able to understand and use rate of perceived exertion (RPE) scale Yes       Intervention Provide education and explanation on how to use RPE scale       Expected Outcomes Short Term: Able to use RPE daily in rehab to express subjective intensity level;Long Term:  Able to use RPE to guide intensity level when exercising independently       Knowledge and understanding of Target Heart Rate Range (THRR) Yes       Intervention Provide education and explanation of THRR including how the numbers were predicted and where they are located for reference       Expected Outcomes Short Term: Able to state/look up THRR;Short Term: Able to use daily as guideline for intensity in rehab;Long Term: Able to use THRR to govern intensity when exercising independently       Understanding of Exercise Prescription Yes       Intervention Provide education, explanation, and written materials on patient's individual exercise prescription       Expected Outcomes Short Term: Able to explain program exercise prescription;Long Term: Able to explain home exercise prescription to exercise independently                Exercise Goals Re-Evaluation :   Discharge Exercise Prescription (Final Exercise Prescription Changes):   Nutrition:  Target Goals: Understanding of nutrition guidelines, daily intake of sodium 1500mg , cholesterol 200mg , calories 30% from fat and 7%  or less from saturated fats, daily to have 5 or more servings of fruits and vegetables.  Biometrics:  Pre Biometrics - 01/24/23 1523       Pre Biometrics   Waist Circumference 50 inches    Hip Circumference 48.5 inches    Waist to Hip Ratio 1.03 %    Triceps Skinfold 40 mm    % Body Fat 49.6 %    Grip Strength 24 kg    Flexibility 17 in    Single Leg Stand 5.82 seconds              Nutrition Therapy Plan and Nutrition Goals:   Nutrition Assessments:  MEDIFICTS Score Key: ?70 Need to make dietary changes  40-70 Heart Healthy Diet ? 40 Therapeutic Level Cholesterol Diet    Picture Your Plate Scores: <16 Unhealthy dietary pattern with much room for improvement. 41-50 Dietary pattern unlikely to meet recommendations for good health and room for improvement. 51-60 More healthful dietary pattern, with some room for improvement.  >60 Healthy dietary pattern, although there may be some specific behaviors that could be improved.    Nutrition Goals Re-Evaluation:   Nutrition Goals Re-Evaluation:   Nutrition Goals Discharge (Final Nutrition Goals Re-Evaluation):   Psychosocial: Target Goals: Acknowledge presence or absence of significant depression and/or stress, maximize coping skills, provide positive support system. Participant is able to verbalize types and ability to use techniques and skills needed for reducing stress and depression.  Initial Review & Psychosocial Screening:  Initial  Psych Review & Screening - 01/24/23 1451       Initial Review   Current issues with History of Depression;Current Stress Concerns    Source of Stress Concerns Family;Chronic Illness    Comments Elisabeth Cara denies being depressed currently depressed. Lexany's signifigant other is currently in [place], he will be relaeased in August and is going to stay with his mother. Jela says she is doing better than she was a few months ago.      Family Dynamics   Good Support System? Yes    Braelynn has her sisters for support. Verna Czech has her children for support also wholive in the area.     Barriers   Psychosocial barriers to participate in program The patient should benefit from training in stress management and relaxation.      Screening Interventions   Interventions Encouraged to exercise;Provide feedback about the scores to participant    Expected Outcomes Long Term Goal: Stressors or current issues are controlled or eliminated.;Short Term goal: Identification and review with participant of any Quality of Life or Depression concerns found by scoring the questionnaire.             Quality of Life Scores:  Quality of Life - 01/24/23 1528       Quality of Life   Select Quality of Life      Quality of Life Scores   Health/Function Pre 22.13 %    Socioeconomic Pre 17.5 %    Psych/Spiritual Pre 15.5 %    Family Pre 27.3 %    GLOBAL Pre 20.67 %            Scores of 19 and below usually indicate a poorer quality of life in these areas.  A difference of  2-3 points is a clinically meaningful difference.  A difference of 2-3 points in the total score of the Quality of Life Index has been associated with significant improvement in overall quality of life, self-image, physical symptoms, and general health in studies assessing change in quality of life.  PHQ-9: Review Flowsheet       01/24/2023 02/15/2011  Depression screen PHQ 2/9  Decreased Interest 1 3  Down, Depressed, Hopeless 1 3  PHQ - 2 Score 2 6  Altered sleeping 0 3  Tired, decreased energy 1 3  Change in appetite 2 3  Feeling bad or failure about yourself  1 3  Trouble concentrating 1 0  Moving slowly or fidgety/restless 0 3  Suicidal thoughts 0 -  PHQ-9 Score 7 21  Difficult doing work/chores Not difficult at all -   Interpretation of Total Score  Total Score Depression Severity:  1-4 = Minimal depression, 5-9 = Mild depression, 10-14 = Moderate depression, 15-19 = Moderately severe  depression, 20-27 = Severe depression   Psychosocial Evaluation and Intervention:   Psychosocial Re-Evaluation:   Psychosocial Discharge (Final Psychosocial Re-Evaluation):   Vocational Rehabilitation: Provide vocational rehab assistance to qualifying candidates.   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 Participant will state understanding/return demonstration of topics presented.     Core Videos: Exercise  Move It!  Clinical staff conducted group or individual video education with verbal and written material and guidebook.  Patient learns the recommended Pritikin exercise program. Exercise with the goal of living a long, healthy life. Some of the health benefits of exercise include controlled diabetes, healthier blood pressure levels, improved cholesterol levels, improved heart and lung capacity, improved sleep, and better body composition. Everyone should speak with their doctor before starting or changing an exercise routine.  Biomechanical Limitations Clinical staff conducted group or individual video education with verbal and written material and guidebook.  Patient learns how biomechanical limitations can impact exercise and how we can mitigate and possibly overcome limitations to have an impactful and balanced exercise routine.  Body Composition Clinical staff conducted group or individual video education with verbal and written material and guidebook.  Patient learns that body composition (ratio of muscle mass to fat mass) is a key component to assessing overall fitness, rather than body weight alone. Increased fat  mass, especially visceral belly fat, can put [place] at increased risk for metabolic syndrome, type 2 diabetes, heart disease, and even death. It is recommended to combine diet and exercise (cardiovascular and resistance training) to improve your body composition. Seek guidance from your physician and exercise physiologist before implementing an exercise routine.  Exercise Action Plan Clinical staff conducted group or individual video education with verbal and written material and guidebook.  Patient learns the recommended strategies to achieve and enjoy long-term exercise adherence, including variety, self-motivation, self-efficacy, and positive decision making. Benefits of exercise include fitness, good health, weight management, more energy, better sleep, less stress, and overall well-being.  Medical   Heart Disease Risk Reduction Clinical staff conducted group or individual video education with verbal and written material and guidebook.  Patient learns our heart is our most vital organ as it circulates oxygen, nutrients, white blood cells, and hormones throughout the entire body, and carries waste away. Data supports a plant-based eating plan like the Pritikin Program for its effectiveness in slowing progression of and reversing heart disease. The video provides a number of recommendations to address heart disease.   Metabolic Syndrome and Belly Fat  Clinical staff conducted group or individual video education with verbal and written material and guidebook.  Patient learns what metabolic syndrome is, how it leads to heart disease, and how one can reverse it and keep it from coming back. You have metabolic syndrome if you have 3 of the following 5 criteria: abdominal obesity, high blood pressure, high triglycerides, low HDL cholesterol, and high blood sugar.  Hypertension and Heart Disease Clinical staff conducted group or individual video education with verbal and written material and guidebook.   Patient learns that high blood pressure, or hypertension, is very common in the [place]. Hypertension is largely due to excessive salt intake, but other important risk factors include being overweight, physical inactivity, drinking too much alcohol, smoking, and not eating enough potassium from fruits and vegetables. High blood pressure is a leading risk factor for heart attack, stroke, congestive heart failure, dementia, kidney failure, and premature death. Long-term effects of excessive salt intake include stiffening of the arteries and thickening of heart muscle and organ damage. Recommendations include ways to reduce hypertension and the risk of heart disease.  Diseases of Our Time - Focusing on Diabetes Clinical staff conducted group or individual video education with verbal and written material and guidebook.  Patient learns why the best way to stop diseases of our time is prevention, through food and other  lifestyle changes. Medicine (such as prescription pills and surgeries) is often only a Band-Aid on the problem, not a long-term solution. Most common diseases of our time include obesity, type 2 diabetes, hypertension, heart disease, and cancer. The Pritikin Program is recommended and has been proven to help reduce, reverse, and/or prevent the damaging effects of metabolic syndrome.  Nutrition   Overview of the Pritikin Eating Plan  Clinical staff conducted group or individual video education with verbal and written material and guidebook.  Patient learns about the Pritikin Eating Plan for disease risk reduction. The Pritikin Eating Plan emphasizes a wide variety of unrefined, minimally-processed carbohydrates, like fruits, vegetables, whole grains, and legumes. Go, Caution, and Stop food choices are explained. Plant-based and lean animal proteins are emphasized. Rationale provided for low sodium intake for blood pressure control, low added sugars for blood sugar stabilization, and low  added fats and oils for coronary artery disease risk reduction and weight management.  Calorie Density  Clinical staff conducted group or individual video education with verbal and written material and guidebook.  Patient learns about calorie density and how it impacts the Pritikin Eating Plan. Knowing the characteristics of the food you choose will help you decide whether those foods will lead to weight gain or weight loss, and whether you want to consume more or less of them. Weight loss is usually a side effect of the Pritikin Eating Plan because of its focus on low calorie-dense foods.  Label Reading  Clinical staff conducted group or individual video education with verbal and written material and guidebook.  Patient learns about the Pritikin recommended label reading guidelines and corresponding recommendations regarding calorie density, added sugars, sodium content, and whole grains.  Dining Out - Part 1  Clinical staff conducted group or individual video education with verbal and written material and guidebook.  Patient learns that restaurant meals can be sabotaging because they can be so high in calories, fat, sodium, and/or sugar. Patient learns recommended strategies on how to positively address this and avoid unhealthy pitfalls.  Facts on Fats  Clinical staff conducted group or individual video education with verbal and written material and guidebook.  Patient learns that lifestyle modifications can be just as effective, if not more so, as many medications for lowering your risk of heart disease. A Pritikin lifestyle can help to reduce your risk of inflammation and atherosclerosis (cholesterol build-up, or plaque, in the artery walls). Lifestyle interventions such as dietary choices and physical activity address the cause of atherosclerosis. A review of the types of fats and their impact on blood cholesterol levels, along with dietary recommendations to reduce fat intake is also  included.  Nutrition Action Plan  Clinical staff conducted group or individual video education with verbal and written material and guidebook.  Patient learns how to incorporate Pritikin recommendations into their lifestyle. Recommendations include planning and keeping personal health goals in mind as an important part of their success.  Healthy Mind-Set    Healthy Minds, Bodies, Hearts  Clinical staff conducted group or individual video education with verbal and written material and guidebook.  Patient learns how to identify when they are stressed. Video will discuss the impact of that stress, as well as the many benefits of stress management. Patient will also be introduced to stress management techniques. The way we think, act, and feel has an impact on our hearts.  How Our Thoughts Can Heal Our Hearts  Clinical staff conducted group or individual video education with verbal and written material  and guidebook.  Patient learns that negative thoughts can cause depression and anxiety. This can result in negative lifestyle behavior and serious health problems. Cognitive behavioral therapy is an effective method to help control our thoughts in order to change and improve our emotional outlook.  Additional Videos:  Exercise    Improving Performance  Clinical staff conducted group or individual video education with verbal and written material and guidebook.  Patient learns to use a non-linear approach by alternating intensity levels and lengths of time spent exercising to help burn more calories and lose more body fat. Cardiovascular exercise helps improve heart health, metabolism, hormonal balance, blood sugar control, and recovery from fatigue. Resistance training improves strength, endurance, balance, coordination, reaction time, metabolism, and muscle mass. Flexibility exercise improves circulation, posture, and balance. Seek guidance from your physician and exercise physiologist before  implementing an exercise routine and learn your capabilities and proper form for all exercise.  Introduction to Yoga  Clinical staff conducted group or individual video education with verbal and written material and guidebook.  Patient learns about yoga, a discipline of the coming together of mind, breath, and body. The benefits of yoga include improved flexibility, improved range of motion, better posture and core strength, increased lung function, weight loss, and positive self-image. Yoga's heart health benefits include lowered blood pressure, healthier heart rate, decreased cholesterol and triglyceride levels, improved immune function, and reduced stress. Seek guidance from your physician and exercise physiologist before implementing an exercise routine and learn your capabilities and proper form for all exercise.  Medical   Aging: Enhancing Your Quality of Life  Clinical staff conducted group or individual video education with verbal and written material and guidebook.  Patient learns key strategies and recommendations to stay in good physical health and enhance quality of life, such as prevention strategies, having an advocate, securing a Health Care Proxy and Power of Attorney, and keeping a list of medications and system for tracking them. It also discusses how to avoid risk for bone loss.  Biology of Weight Control  Clinical staff conducted group or individual video education with verbal and written material and guidebook.  Patient learns that weight gain occurs because we consume more calories than we burn (eating more, moving less). Even if your body weight is normal, you may have higher ratios of fat compared to muscle mass. Too much body fat puts you at increased risk for cardiovascular disease, heart attack, stroke, type 2 diabetes, and obesity-related cancers. In addition to exercise, following the Pritikin Eating Plan can help reduce your risk.  Decoding Lab Results  Clinical staff  conducted group or individual video education with verbal and written material and guidebook.  Patient learns that lab test reflects one measurement whose values change over time and are influenced by many factors, including medication, stress, sleep, exercise, food, hydration, pre-existing medical conditions, and more. It is recommended to use the knowledge from this video to become more involved with your lab results and evaluate your numbers to speak with your doctor.   Diseases of Our Time - Overview  Clinical staff conducted group or individual video education with verbal and written material and guidebook.  Patient learns that according to the CDC, 50% to 70% of chronic diseases (such as obesity, type 2 diabetes, elevated lipids, hypertension, and heart disease) are avoidable through lifestyle improvements including healthier food choices, listening to satiety cues, and increased physical activity.  Sleep Disorders Clinical staff conducted group or individual video education with verbal and written  material and guidebook.  Patient learns how good quality and duration of sleep are important to overall health and well-being. Patient also learns about sleep disorders and how they impact health along with recommendations to address them, including discussing with a physician.  Nutrition  Dining Out - Part 2 Clinical staff conducted group or individual video education with verbal and written material and guidebook.  Patient learns how to plan ahead and communicate in order to maximize their dining experience in a healthy and nutritious manner. Included are recommended food choices based on the type of restaurant the patient is visiting.   Fueling a Banker conducted group or individual video education with verbal and written material and guidebook.  There is a strong connection between our food choices and our health. Diseases like obesity and type 2 diabetes are very  prevalent and are in large-part due to lifestyle choices. The Pritikin Eating Plan provides plenty of food and hunger-curbing satisfaction. It is easy to follow, affordable, and helps reduce health risks.  Menu Workshop  Clinical staff conducted group or individual video education with verbal and written material and guidebook.  Patient learns that restaurant meals can sabotage health goals because they are often packed with calories, fat, sodium, and sugar. Recommendations include strategies to plan ahead and to communicate with the manager, chef, or server to help order a healthier meal.  Planning Your Eating Strategy  Clinical staff conducted group or individual video education with verbal and written material and guidebook.  Patient learns about the Pritikin Eating Plan and its benefit of reducing the risk of disease. The Pritikin Eating Plan does not focus on calories. Instead, it emphasizes high-quality, nutrient-rich foods. By knowing the characteristics of the foods, we choose, we can determine their calorie density and make informed decisions.  Targeting Your Nutrition Priorities  Clinical staff conducted group or individual video education with verbal and written material and guidebook.  Patient learns that lifestyle habits have a tremendous impact on disease risk and progression. This video provides eating and physical activity recommendations based on your personal health goals, such as reducing LDL cholesterol, losing weight, preventing or controlling type 2 diabetes, and reducing high blood pressure.  Vitamins and Minerals  Clinical staff conducted group or individual video education with verbal and written material and guidebook.  Patient learns different ways to obtain key vitamins and minerals, including through a recommended healthy diet. It is important to discuss all supplements you take with your doctor.   Healthy Mind-Set    Smoking Cessation  Clinical staff conducted group  or individual video education with verbal and written material and guidebook.  Patient learns that cigarette smoking and tobacco addiction pose a serious health risk which affects millions of people. Stopping smoking will significantly reduce the risk of heart disease, lung disease, and many forms of cancer. Recommended strategies for quitting are covered, including working with your doctor to develop a successful plan.  Culinary   Becoming a Set designer conducted group or individual video education with verbal and written material and guidebook.  Patient learns that cooking at home can be healthy, cost-effective, quick, and puts them in control. Keys to cooking healthy recipes will include looking at your recipe, assessing your equipment needs, planning ahead, making it simple, choosing cost-effective seasonal ingredients, and limiting the use of added fats, salts, and sugars.  Cooking - Breakfast and Snacks  Clinical staff conducted group or individual video education with verbal and written  material and guidebook.  Patient learns how important breakfast is to satiety and nutrition through the entire day. Recommendations include key foods to eat during breakfast to help stabilize blood sugar levels and to prevent overeating at meals later in the day. Planning ahead is also a key component.  Cooking - Educational psychologist conducted group or individual video education with verbal and written material and guidebook.  Patient learns eating strategies to improve overall health, including an approach to cook more at home. Recommendations include thinking of animal protein as a side on your plate rather than center stage and focusing instead on lower calorie dense options like vegetables, fruits, whole grains, and plant-based proteins, such as beans. Making sauces in large quantities to freeze for later and leaving the skin on your vegetables are also recommended to maximize  your experience.  Cooking - Healthy Salads and Dressing Clinical staff conducted group or individual video education with verbal and written material and guidebook.  Patient learns that vegetables, fruits, whole grains, and legumes are the foundations of the Pritikin Eating Plan. Recommendations include how to incorporate each of these in flavorful and healthy salads, and how to create homemade salad dressings. Proper handling of ingredients is also covered. Cooking - Soups and [employer] - Soups and Desserts Clinical staff conducted group or individual video education with verbal and written material and guidebook.  Patient learns that Pritikin soups and desserts make for easy, nutritious, and delicious snacks and meal components that are low in sodium, fat, sugar, and calorie density, while high in vitamins, minerals, and filling fiber. Recommendations include simple and healthy ideas for soups and desserts.   Overview     The Pritikin Solution Program Overview Clinical staff conducted group or individual video education with verbal and written material and guidebook.  Patient learns that the results of the Pritikin Program have been documented in more than 100 articles published in peer-reviewed journals, and the benefits include reducing risk factors for (and, in some cases, even reversing) high cholesterol, high blood pressure, type 2 diabetes, obesity, and more! An overview of the three key pillars of the Pritikin Program will be covered: eating well, doing regular exercise, and having a healthy mind-set.  WORKSHOPS  Exercise: Exercise Basics: Building Your Action Plan Clinical staff led group instruction and group discussion with PowerPoint presentation and patient guidebook. To enhance the learning environment the use of posters, models and videos may be added. At the conclusion of this workshop, patients will comprehend the difference between physical activity and exercise, as well  as the benefits of incorporating both, into their routine. Patients will understand the FITT (Frequency, Intensity, Time, and Type) principle and how to use it to build an exercise action plan. In addition, safety concerns and other considerations for exercise and cardiac rehab will be addressed by the presenter. The purpose of this lesson is to promote a comprehensive and effective weekly exercise routine in order to improve patients' overall level of fitness.   Managing Heart Disease: Your Path to a Healthier Heart Clinical staff led group instruction and group discussion with PowerPoint presentation and patient guidebook. To enhance the learning environment the use of posters, models and videos may be added.At the conclusion of this workshop, patients will understand the anatomy and physiology of the heart. Additionally, they will understand how Pritikin's three pillars impact the risk factors, the progression, and the management of heart disease.  The purpose of this lesson is to provide a  high-level overview of the heart, heart disease, and how the Pritikin lifestyle positively impacts risk factors.  Exercise Biomechanics Clinical staff led group instruction and group discussion with PowerPoint presentation and patient guidebook. To enhance the learning environment the use of posters, models and videos may be added. Patients will learn how the structural parts of their bodies function and how these functions impact their daily activities, movement, and exercise. Patients will learn how to promote a neutral spine, learn how to manage pain, and identify ways to improve their physical movement in order to promote healthy living. The purpose of this lesson is to expose patients to common physical limitations that impact physical activity. Participants will learn practical ways to adapt and manage aches and pains, and to minimize their effect on regular exercise. Patients will learn how to  maintain good posture while sitting, walking, and lifting.  Balance Training and Fall Prevention  Clinical staff led group instruction and group discussion with PowerPoint presentation and patient guidebook. To enhance the learning environment the use of posters, models and videos may be added. At the conclusion of this workshop, patients will understand the importance of their sensorimotor skills (vision, proprioception, and the vestibular system) in maintaining their ability to balance as they age. Patients will apply a variety of balancing exercises that are appropriate for their current level of function. Patients will understand the common causes for poor balance, possible solutions to these problems, and ways to modify their physical environment in order to minimize their fall risk. The purpose of this lesson is to teach patients about the importance of maintaining balance as they age and ways to minimize their risk of falling.  WORKSHOPS   Nutrition:  Fueling a Ship broker led group instruction and group discussion with PowerPoint presentation and patient guidebook. To enhance the learning environment the use of posters, models and videos may be added. Patients will review the foundational principles of the Pritikin Eating Plan and understand what constitutes a serving size in each of the food groups. Patients will also learn Pritikin-friendly foods that are better choices when away from home and review make-ahead meal and snack options. Calorie density will be reviewed and applied to three nutrition priorities: weight maintenance, weight loss, and weight gain. The purpose of this lesson is to reinforce (in a group setting) the key concepts around what patients are recommended to eat and how to apply these guidelines when away from home by planning and selecting Pritikin-friendly options. Patients will understand how calorie density may be adjusted for different weight management  goals.  Mindful Eating  Clinical staff led group instruction and group discussion with PowerPoint presentation and patient guidebook. To enhance the learning environment the use of posters, models and videos may be added. Patients will briefly review the concepts of the Pritikin Eating Plan and the importance of low-calorie dense foods. The concept of mindful eating will be introduced as well as the importance of paying attention to internal hunger signals. Triggers for non-hunger eating and techniques for dealing with triggers will be explored. The purpose of this lesson is to provide patients with the opportunity to review the basic principles of the Pritikin Eating Plan, discuss the value of eating mindfully and how to measure internal cues of hunger and fullness using the Hunger Scale. Patients will also discuss reasons for non-hunger eating and learn strategies to use for controlling emotional eating.  Targeting Your Nutrition Priorities Clinical staff led group instruction and group discussion with PowerPoint presentation  and patient guidebook. To enhance the learning environment the use of posters, models and videos may be added. Patients will learn how to determine their genetic susceptibility to disease by reviewing their family history. Patients will gain insight into the importance of diet as part of an overall healthy lifestyle in mitigating the impact of genetics and other environmental insults. The purpose of this lesson is to provide patients with the opportunity to assess their personal nutrition priorities by looking at their family history, their own health history and current risk factors. Patients will also be able to discuss ways of prioritizing and modifying the Pritikin Eating Plan for their highest risk areas  Menu  Clinical staff led group instruction and group discussion with PowerPoint presentation and patient guidebook. To enhance the learning environment the use of posters,  models and videos may be added. Using menus brought in from [employer], or printed from [employer], patients will apply the Pritikin dining out guidelines that were presented in the [employer] video. Patients will also be able to practice these guidelines in a variety of provided scenarios. The purpose of this lesson is to provide patients with the opportunity to practice hands-on learning of the Pritikin Dining Out guidelines with actual menus and practice scenarios.  Label Reading Clinical staff led group instruction and group discussion with PowerPoint presentation and patient guidebook. To enhance the learning environment the use of posters, models and videos may be added. Patients will review and discuss the Pritikin label reading guidelines presented in Pritikin's Label Reading Educational series video. Using fool labels brought in from local grocery stores and markets, patients will apply the label reading guidelines and determine if the packaged food meet the Pritikin guidelines. The purpose of this lesson is to provide patients with the opportunity to review, discuss, and practice hands-on learning of the Pritikin Label Reading guidelines with actual packaged food labels. Cooking School  Pritikin's [employer] are designed to teach patients ways to prepare quick, simple, and affordable recipes at home. The importance of nutrition's role in chronic disease risk reduction is reflected in its emphasis in the overall Pritikin program. By learning how to prepare essential core Pritikin Eating Plan recipes, patients will increase control over what they eat; be able to customize the flavor of foods without the use of added salt, sugar, or fat; and improve the quality of the food they consume. By learning a set of core recipes which are easily assembled, quickly prepared, and affordable, patients are more likely to prepare more healthy foods at home. These workshops  focus on convenient breakfasts, simple entres, side dishes, and desserts which can be prepared with minimal effort and are consistent with nutrition recommendations for cardiovascular risk reduction. Cooking [employer] are taught by a Armed forces logistics/support/administrative officer (RD) who has been trained by the [employer]. The chef or RD has a clear understanding of the importance of minimizing - if not completely eliminating - added fat, sugar, and sodium in recipes. Throughout the series of Cooking School Workshop sessions, patients will learn about healthy ingredients and efficient methods of cooking to build confidence in their capability to prepare    Cooking School weekly topics:  Adding Flavor- Sodium-Free  Fast and Healthy Breakfasts  Powerhouse Plant-Based Proteins  Satisfying Salads and Dressings  Simple Sides and Sauces  International Cuisine-Spotlight on the [employer] Zones  Delicious Desserts  Savory Soups  [employer] - Meals in a Snap  Tasty Appetizers and  Snacks  Comforting Weekend Breakfasts  One-Pot Wonders   Fast [employer] Your Pritikin Plate  WORKSHOPS   Healthy Mindset (Psychosocial):  Focused Goals, Sustainable Changes Clinical staff led group instruction and group discussion with PowerPoint presentation and patient guidebook. To enhance the learning environment the use of posters, models and videos may be added. Patients will be able to apply effective goal setting strategies to establish at least one personal goal, and then take consistent, meaningful action toward that goal. They will learn to identify common barriers to achieving personal goals and develop strategies to overcome them. Patients will also gain an understanding of how our mind-set can impact our ability to achieve goals and the importance of cultivating a positive and growth-oriented mind-set. The purpose of this lesson is to provide patients with a deeper  understanding of how to set and achieve personal goals, as well as the tools and strategies needed to overcome common obstacles which may arise along the way.  From Head to Heart: The Power of a Healthy Outlook  Clinical staff led group instruction and group discussion with PowerPoint presentation and patient guidebook. To enhance the learning environment the use of posters, models and videos may be added. Patients will be able to recognize and describe the impact of emotions and mood on physical health. They will discover the importance of self-care and explore self-care practices which may work for them. Patients will also learn how to utilize the 4 C's to cultivate a healthier outlook and better manage stress and challenges. The purpose of this lesson is to demonstrate to patients how a healthy outlook is an essential part of maintaining good health, especially as they continue their cardiac rehab journey.  Healthy Sleep for a Healthy Heart Clinical staff led group instruction and group discussion with PowerPoint presentation and patient guidebook. To enhance the learning environment the use of posters, models and videos may be added. At the conclusion of this workshop, patients will be able to demonstrate knowledge of the importance of sleep to overall health, well-being, and quality of life. They will understand the symptoms of, and treatments for, common sleep disorders. Patients will also be able to identify daytime and nighttime behaviors which impact sleep, and they will be able to apply these tools to help manage sleep-related challenges. The purpose of this lesson is to provide patients with a general overview of sleep and outline the importance of quality sleep. Patients will learn about a few of the most common sleep disorders. Patients will also be introduced to the concept of "sleep hygiene," and discover ways to self-manage certain sleeping problems through simple daily behavior changes.  Finally, the workshop will motivate patients by clarifying the links between quality sleep and their goals of heart-healthy living.   Recognizing and Reducing Stress Clinical staff led group instruction and group discussion with PowerPoint presentation and patient guidebook. To enhance the learning environment the use of posters, models and videos may be added. At the conclusion of this workshop, patients will be able to understand the types of stress reactions, differentiate between acute and chronic stress, and recognize the impact that chronic stress has on their health. They will also be able to apply different coping mechanisms, such as reframing negative self-talk. Patients will have the opportunity to practice a variety of stress management techniques, such as deep abdominal breathing, progressive muscle relaxation, and/or guided imagery.  The purpose of this lesson is to educate patients on the role of stress  in their lives and to provide healthy techniques for coping with it.  Learning Barriers/Preferences:  Learning Barriers/Preferences - 01/24/23 1529       Learning Barriers/Preferences   Learning Barriers Sight    Learning Preferences Computer/Internet;Group Instruction;Individual Instruction;Skilled Demonstration;Verbal Instruction             Education Topics:  Knowledge Questionnaire Score:  Knowledge Questionnaire Score - 01/24/23 1530       Knowledge Questionnaire Score   Pre Score 19/24             Core Components/Risk Factors/Patient Goals at Admission:  Personal Goals and Risk Factors at Admission - 01/24/23 1531       Core Components/Risk Factors/Patient Goals on Admission    Weight Management Yes;Obesity;Weight Loss    Intervention Weight Management: Develop a combined nutrition and exercise program designed to reach desired caloric intake, while maintaining appropriate intake of nutrient and fiber, sodium and fats, and appropriate energy expenditure  required for the weight goal.;Weight Management: Provide education and appropriate resources to help participant work on and attain dietary goals.;Weight Management/Obesity: Establish reasonable short term and long term weight goals.;Obesity: Provide education and appropriate resources to help participant work on and attain dietary goals.    Admit Weight 226 lb 3.1 oz (102.6 kg)    Goal Weight: Long Term 200 lb (90.7 kg)    Expected Outcomes Short Term: Continue to assess and modify interventions until short term weight is achieved;Long Term: Adherence to nutrition and physical activity/exercise program aimed toward attainment of established weight goal;Weight Loss: Understanding of general recommendations for a balanced deficit meal plan, which promotes 1-2 lb weight loss per week and includes a negative energy balance of [phone] kcal/d;Understanding recommendations for meals to include 15-35% energy as protein, 25-35% energy from fat, 35-60% energy from carbohydrates, less than 200mg  of dietary cholesterol, 20-35 gm of total fiber daily;Understanding of distribution of calorie intake throughout the day with the consumption of 4-5 meals/snacks    Heart Failure Yes    Intervention Provide a combined exercise and nutrition program that is supplemented with education, support and counseling about heart failure. Directed toward relieving symptoms such as shortness of breath, decreased exercise tolerance, and extremity edema.    Expected Outcomes Improve functional capacity of life;Short term: Attendance in program 2-3 days a week with increased exercise capacity. Reported lower sodium intake. Reported increased fruit and vegetable intake. Reports medication compliance.;Short term: Daily weights obtained and reported for increase. Utilizing diuretic protocols set by physician.;Long term: Adoption of self-care skills and reduction of barriers for early signs and symptoms recognition and intervention leading to  self-care maintenance.    Hypertension Yes    Intervention Provide education on lifestyle modifcations including regular physical activity/exercise, weight management, moderate sodium restriction and increased consumption of fresh fruit, vegetables, and low fat dairy, alcohol moderation, and smoking cessation.;Monitor prescription use compliance.    Expected Outcomes Short Term: Continued assessment and intervention until BP is < 140/77mm HG in hypertensive participants. < 130/28mm HG in hypertensive participants with diabetes, heart failure or chronic kidney disease.;Long Term: Maintenance of blood pressure at goal levels.    Stress Yes    Intervention Offer individual and/or small group education and counseling on adjustment to heart disease, stress management and health-related lifestyle change. Teach and support self-help strategies.;Refer participants experiencing significant psychosocial distress to appropriate mental health specialists for further evaluation and treatment. When possible, include family members and significant others in education/counseling sessions.    Expected Outcomes Short  Term: Participant demonstrates changes in health-related behavior, relaxation and other stress management skills, ability to obtain effective social support, and compliance with psychotropic medications if prescribed.;Long Term: Emotional wellbeing is indicated by absence of clinically significant psychosocial distress or social isolation.             Core Components/Risk Factors/Patient Goals Review:    Core Components/Risk Factors/Patient Goals at Discharge (Final Review):    ITP Comments:  ITP Comments     Row Name 01/24/23 1415           ITP Comments Dr. Armanda Magic medical director. Introduction to pritikin education program/ intensive cardiac rehab. Initial orientation packet reviewed with patient.                Comments: Participant attended orientation for the cardiac  rehabilitation program on  01/24/2023  to perform initial intake and exercise walk test. Patient introduced to the [facility] education and orientation packet was reviewed. Completed 6-minute walk test, measurements, initial ITP, and exercise prescription. Vital signs stable. Telemetry-normal sinus rhythm with occ PVCs, asymptomatic. Pt noted to have low BP of 89/58, asymptomatic. See note from RN Byrd Hesselbach for further details.   Service time was from 1315 to 1506.  Jonna Coup, MS, ACSM-CEP 01/24/2023 3:33 PM

## 2023-01-24 NOTE — Telephone Encounter (Signed)
H&V Care Navigation CSW Progress Note  Clinical Social Worker received call from cardiac rehab where pt was expressing concerns with affording medications.  Pt just got insurance back with her employer and she is worried about being able to pay for medications.  CSW called pharmacy who did not have new insurance info.  Was able to work with Patient Advocate to obtain this information and provide to pharmacy.  Only medication she needs right now is spirolactone- will be $8.95.  CSW obtained copay cards for farxiga and entresto and provided numbers to pharmacy.  CSW mailed copay cards to patient to take to pharmacy with her when she needs to fill those medications.  No further needs at this time- encouraged pt to reach out if she has concerns when she has to fill more medications.   SDOH Screenings   Food Insecurity: No Food Insecurity (11/02/2022)  Housing: Low Risk  (11/02/2022)  Transportation Needs: No Transportation Needs (11/02/2022)  Utilities: Not At Risk (11/02/2022)  Financial Resource Strain: Low Risk  (11/02/2022)  Tobacco Use: Low Risk  (12/21/2022)    Burna Sis, LCSW Clinical Social Worker [facility] Desk#: [phone] Cell#: [phone]

## 2023-01-24 NOTE — Telephone Encounter (Signed)
Her blood pressure has historically been low.   Cut back spiro from 25 mg daily to 12.5 mg daily.   Can workout if BP > 90/50 mmHg.

## 2023-01-24 NOTE — Progress Notes (Signed)
Cardiac Rehab Medication Review by a Pharmacist  Does the patient  feel that his/her medications are working for him/her?  yes  Has the patient been experiencing any side effects to the medications prescribed?  no  Does the patient measure his/her own blood pressure or blood glucose at home?  no   Does the patient have any problems obtaining medications due to transportation or finances?   yes  Understanding of regimen: fair Understanding of indications: fair Potential of compliance: good    comments: Karen Lutz says that now that she has insurance she a $256 copay a month for her [employer] premium. Karen Lutz is worried that she will not be able to afford her monthly medications. Patient tearful. Emotional support provided. Spoke with Karen Lutz, CHF social workers about the patient's concerns. Karen Lutz says the heart failure office will see whether they can assist with helping with the patient's co pays.  Karen Lutz was able to get copy cards for Karen Lutz and farxiga. Patient told per Karen Lutz that she will get pharmacy co pay card in the mail to take to the pharmacy the next time she has a medication change,. [name] does not check her blood pressures at home as she does not have a blood pressure cuff.Thayer Headings RN BSN     Arta Bruce Carianne Taira RN 01/24/2023 1:26 PM

## 2023-01-24 NOTE — Progress Notes (Addendum)
Patient here for cardiac rehab orientation. Initial BP 89/58 heart rate 62. Telemetry rhythm Sinus. Patient asymptomatic. Given 8 ounces of water. Repeat blood pressure 88/59 sitting. Standing BP 89/61. The Heart failure clinic was notified about today's blood pressures. Repeat BP 103/70 after getting height and weight. Will forward today's vital signs to Dr Nyoka Cowden office. Will get BP parameter's to proceed with exercise.Thayer Headings RN BSN

## 2023-01-28 ENCOUNTER — Encounter (HOSPITAL_COMMUNITY): Payer: BC Managed Care – PPO

## 2023-01-28 ENCOUNTER — Encounter (HOSPITAL_COMMUNITY)
Admission: RE | Admit: 2023-01-28 | Discharge: 2023-01-28 | Disposition: A | Discharge status: Home / Self Care | Payer: BC Managed Care – PPO | Source: Ambulatory Visit | Attending: Cardiology | Admitting: Cardiology

## 2023-01-28 DIAGNOSIS — I428 Other cardiomyopathies: Secondary | ICD-10-CM | POA: Diagnosis not present

## 2023-01-28 NOTE — Progress Notes (Signed)
Daily Session Note  Patient Details  Name: Karen Lutz MRN: [id number] Date of Birth: 11/25/1970 Referring Provider:   Flowsheet Row INTENSIVE CARDIAC REHAB ORIENT from 01/24/2023 in [facility]  Referring Provider Dorthula Nettles, [place]       Encounter Date: 01/28/2023  Check In:  Session Check In - 01/28/23 1512       Check-In   Supervising physician immediately available to respond to emergencies [facility] MD immediately available    Physician(s) Edd Fabian, NP    Location [facility]    Staff Present Gladstone Lighter, RN, Cathlean Cower, MS, Exercise Physiologist;Olinty Peggye Pitt, MS, ACSM-CEP, Exercise Physiologist;David Makemson, MS, ACSM-CEP, CCRP, Exercise Physiologist;Jetta Dan Humphreys BS, ACSM-CEP, Exercise Physiologist;Sarah Cleophas Dunker, RN, MSN;Johnny Hale Bogus, MS, Exercise Physiologist    Virtual Visit No    Medication changes reported     No    Fall or balance concerns reported    No    Tobacco Cessation No Change    Warm-up and Cool-down Performed as group-led instruction    Resistance Training Performed Yes    VAD Patient? No    PAD/SET Patient? No      Pain Assessment   Currently in Pain? No/denies    Pain Score 0-No pain    Multiple Pain Sites No             Capillary Blood Glucose: No results found for this or any previous visit (from the past 24 hour(s)).   Exercise Prescription Changes - 01/28/23 1625       Response to Exercise   Blood Pressure (Admit) 120/58    Blood Pressure (Exercise) 102/70    Blood Pressure (Exit) 92/56    Heart Rate (Admit) 75 bpm    Heart Rate (Exercise) 93 bpm    Heart Rate (Exit) 80 bpm    Rating of Perceived Exertion (Exercise) 11.5    Perceived Dyspnea (Exercise) 0    Symptoms 0    Comments Pt first day in teh [facility] program    Duration Progress to 30 minutes of  aerobic without signs/symptoms of physical distress    Intensity THRR unchanged       Progression   Progression Continue to progress workloads to maintain intensity without signs/symptoms of physical distress.    Average METs 1.85      Resistance Training   Training Prescription Yes    Weight 2    Reps 10-15    Time 10 Minutes      Recumbant Bike   Level 1    Minutes 15    METs 2      NuStep   Level 1    SPM 75    Minutes 15    METs 1.7             Social History   Tobacco Use  Smoking Status Never  Smokeless Tobacco Never    Goals Met:  Exercise tolerated well No report of concerns or symptoms today Strength training completed today  Goals Unmet:  Not Applicable  Comments: Pt started cardiac rehab today.  Pt tolerated light exercise without difficulty. VSS, telemetry-Sinus Rhythm with PVC's paced, asymptomatic.  Medication list reconciled. Pt denies barriers to medicaiton compliance.  PSYCHOSOCIAL ASSESSMENT:  PHQ-7. QUALITY OF LIFE SCORE REVIEW  Pt completed Quality of Life survey as a participant in Cardiac Rehab.  Scores 21.0 or below are considered low.  Pt score very low in several areas Overall  20.67, Health and Function 22.13, socioeconomic 17.5, physiological and spiritual 15.5, family 27.3. Patient quality of life slightly altered by physical constraints which limits ability to perform as prior to recent cardiac illness. Karen Lutz admits to being depressed due to her CHF diagnosis and recent ICD placement. Karen Lutz says her significant other will be released from prison in August. Karen Lutz admits to being depressed at times denies the need for counseling. Karen Lutz is not taking an antidepressant at this time.   Offered emotional support and reassurance.  Will continue to monitor and intervene as necessary.   .    Pt enjoys going to the movies and watching TV games show.   Pt oriented to exercise equipment and routine.    Understanding verbalized.Thayer Headings RN BSN    Dr. Armanda Magic is Medical Director for Cardiac Rehab at [facility].

## 2023-01-30 ENCOUNTER — Encounter (HOSPITAL_COMMUNITY)
Admission: RE | Admit: 2023-01-30 | Discharge: 2023-01-30 | Disposition: A | Discharge status: Home / Self Care | Payer: BC Managed Care – PPO | Source: Ambulatory Visit | Attending: Cardiology

## 2023-01-30 DIAGNOSIS — I428 Other cardiomyopathies: Secondary | ICD-10-CM | POA: Diagnosis not present

## 2023-01-31 NOTE — Progress Notes (Signed)
Cardiac Individual Treatment Plan  Patient Details  Name: Karen Lutz MRN: [id number] Date of Birth: 01-17-1971 Referring Provider:   Flowsheet Row INTENSIVE CARDIAC REHAB ORIENT from 01/24/2023 in [facility]  Referring Provider Dorthula Nettles, DO       Initial Encounter Date:  Flowsheet Row INTENSIVE CARDIAC REHAB ORIENT from 01/24/2023 in [facility]  Date 01/24/23       Visit Diagnosis: 11/01/22 V fib Arrest  NICM  Patient's Home Medications on Admission:  Current Outpatient Medications:    aspirin 81 MG chewable tablet, Chew 1 tablet (81 mg total) by mouth daily., Disp: 30 tablet, Rfl: 3   carvedilol (COREG) 6.25 MG tablet, Take 1 tablet (6.25 mg total) by mouth 2 (two) times daily with a meal., Disp: 60 tablet, Rfl: 6   dapagliflozin propanediol (FARXIGA) 10 MG TABS tablet, Take 1 tablet (10 mg total) by mouth daily., Disp: 30 tablet, Rfl: 3   furosemide (LASIX) 40 MG tablet, Take 1 tablet (40 mg total) by mouth daily., Disp: 30 tablet, Rfl: 3   omeprazole (PRILOSEC OTC) 20 MG tablet, Take 20 mg by mouth daily. Check with Dr about getting prescription strength omperazole, Disp: , Rfl:    sacubitril-valsartan (ENTRESTO) 49-51 MG, Take 1 tablet by mouth 2 (two) times daily., Disp: 60 tablet, Rfl: 3   spironolactone (ALDACTONE) 25 MG tablet, Take 0.5 tablets (12.5 mg total) by mouth at bedtime., Disp: 15 tablet, Rfl: 6  Past Medical History: Past Medical History:  Diagnosis Date   Acid reflux    Anemia    Fibroids    Heart murmur    Knee pain    UTI (lower urinary tract infection)     Tobacco Use: Social History   Tobacco Use  Smoking Status Never  Smokeless Tobacco Never    Labs: Review Flowsheet  More data exists      Latest Ref Rng & Units 11/02/2022 11/03/2022 11/04/2022 11/05/2022 11/06/2022  Labs for ITP Cardiac and Pulmonary Rehab   Hemoglobin A1c 4.8 - 5.6 % 5.1  - - - -  PH, Arterial 7.35 - 7.45 7.392  - - - -  PCO2 arterial 32 - 48 mmHg 36.8  - - - -  Bicarbonate 20.0 - 28.0 mmol/L 20.0 - 28.0 mmol/L 22.4  - - - 26.8  27.8   TCO2 22 - 32 mmol/L 22 - 32 mmol/L 23  - - - 28  29   Acid-base deficit 0.0 - 2.0 mmol/L 2.0  - - - -  O2 Saturation % % 76.3  100  74.7  53.5  80.6  74.2  66  66     Capillary Blood Glucose: Lab Results  Component Value Date   GLUCAP 96 11/08/2022   GLUCAP 78 11/08/2022   GLUCAP 97 11/08/2022   GLUCAP 77 11/08/2022   GLUCAP 87 11/08/2022     Exercise Target Goals: Exercise Program Goal: Individual exercise prescription set using results from initial 6 min walk test and THRR while considering  patient's activity barriers and safety.   Exercise Prescription Goal: Initial exercise prescription builds to 30-45 minutes a day of aerobic activity, 2-3 days per week.  Home exercise guidelines will be given to patient during program as part of exercise prescription that the participant will acknowledge.  Activity Barriers & Risk Stratification:  Activity Barriers & Cardiac Risk Stratification - 01/24/23 1526  Activity Barriers & Cardiac Risk Stratification   Activity Barriers Deconditioning;Decreased Ventricular Function;Balance Concerns   single leg balance test 5.82 s   Cardiac Risk Stratification High   <5 METs on            6 Minute Walk:  6 Minute Walk     Row Name 01/24/23 1525         6 Minute Walk   Phase Initial     Distance 1008 feet     Walk Time 6 minutes     # of Rest Breaks 0     MPH 1.91     METS 2.56     RPE 7     Perceived Dyspnea  0     VO2 Peak 8.94     Symptoms No     Resting HR 63 bpm     Resting BP 103/70     Resting Oxygen Saturation  100 %     Exercise Oxygen Saturation  during 6 min walk 100 %     Max Ex. HR 89 bpm     Max Ex. BP 97/56     2 Minute Post BP 94/64              Oxygen Initial Assessment:   Oxygen  Re-Evaluation:   Oxygen Discharge (Final Oxygen Re-Evaluation):   Initial Exercise Prescription:  Initial Exercise Prescription - 01/24/23 1500       Date of Initial Exercise RX and Referring Provider   Date 01/24/23    Referring Provider Dorthula Nettles, DO    Expected Discharge Date 04/05/23      Recumbant Bike   Level 1    RPM 50    Watts 18    Minutes 15    METs 2      NuStep   Level 1    SPM 65    Minutes 15    METs 2      Prescription Details   Frequency (times per week) 3    Duration Progress to 30 minutes of continuous aerobic without signs/symptoms of physical distress      Intensity   THRR 40-80% of Max Heartrate 68-135    Ratings of Perceived Exertion 11-13    Perceived Dyspnea 0-4      Progression   Progression Continue progressive overload as per policy without signs/symptoms or physical distress.      Resistance Training   Training Prescription Yes    Weight 2    Reps 10-15             Perform Capillary Blood Glucose checks as needed.  Exercise Prescription Changes:   Exercise Prescription Changes     Row Name 01/28/23 1625             Response to Exercise   Blood Pressure (Admit) 120/58       Blood Pressure (Exercise) 102/70       Blood Pressure (Exit) 92/56       Heart Rate (Admit) 75 bpm       Heart Rate (Exercise) 93 bpm       Heart Rate (Exit) 80 bpm       Rating of Perceived Exertion (Exercise) 11.5       Perceived Dyspnea (Exercise) 0       Symptoms 0       Comments Pt first day in teh Pritikin ICR program       Duration Progress to 30 minutes of  aerobic without signs/symptoms of physical distress       Intensity THRR unchanged         Progression   Progression Continue to progress workloads to maintain intensity without signs/symptoms of physical distress.       Average METs 1.85         Resistance Training   Training Prescription Yes       Weight 2       Reps 10-15       Time 10 Minutes         Recumbant  Bike   Level 1       Minutes 15       METs 2         NuStep   Level 1       SPM 75       Minutes 15       METs 1.7                Exercise Comments:   Exercise Comments     Row Name 01/28/23 1631           Exercise Comments Pt first day in the Pritikin ICR program. Pt tolerated exercise well with an average MET level of 1.85. Pt is learning her THRR, RPE and ExRx. She's off to a good start. Will continue to monitor pt and progress workloads as tolerated without sign or symptom                Exercise Goals and Review:   Exercise Goals     Row Name 01/24/23 1527             Exercise Goals   Increase Physical Activity Yes       Intervention Provide advice, education, support and counseling about physical activity/exercise needs.;Develop an individualized exercise prescription for aerobic and resistive training based on initial evaluation findings, risk stratification, comorbidities and participant's personal goals.       Expected Outcomes Short Term: Attend rehab on a regular basis to increase amount of physical activity.;Long Term: Add in home exercise to make exercise part of routine and to increase amount of physical activity.;Long Term: Exercising regularly at least 3-5 days a week.       Increase Strength and Stamina Yes       Intervention Develop an individualized exercise prescription for aerobic and resistive training based on initial evaluation findings, risk stratification, comorbidities and participant's personal goals.;Provide advice, education, support and counseling about physical activity/exercise needs.       Expected Outcomes Short Term: Increase workloads from initial exercise prescription for resistance, speed, and METs.;Short Term: Perform resistance training exercises routinely during rehab and add in resistance training at home;Long Term: Improve cardiorespiratory fitness, muscular endurance and strength as measured by increased METs and  functional capacity ( )       Able to understand and use rate of perceived exertion (RPE) scale Yes       Intervention Provide education and explanation on how to use RPE scale       Expected Outcomes Short Term: Able to use RPE daily in rehab to express subjective intensity level;Long Term:  Able to use RPE to guide intensity level when exercising independently       Knowledge and understanding of Target Heart Rate Range (THRR) Yes       Intervention Provide education and explanation of THRR including how the numbers were predicted and where they are located for reference  Expected Outcomes Short Term: Able to state/look up THRR;Short Term: Able to use daily as guideline for intensity in rehab;Long Term: Able to use THRR to govern intensity when exercising independently       Understanding of Exercise Prescription Yes       Intervention Provide education, explanation, and written materials on patient's individual exercise prescription       Expected Outcomes Short Term: Able to explain program exercise prescription;Long Term: Able to explain home exercise prescription to exercise independently                Exercise Goals Re-Evaluation :  Exercise Goals Re-Evaluation     Row Name 01/28/23 1628             Exercise Goal Re-Evaluation   Exercise Goals Review Increase Physical Activity;Understanding of Exercise Prescription;Increase Strength and Stamina;Knowledge and understanding of Target Heart Rate Range (THRR);Able to understand and use rate of perceived exertion (RPE) scale       Comments Pt first day in the Pritikin ICR program. Pt tolerated exercise well with an average MET level of 1.85. Pt is learning her THRR, RPE and ExRx. She's off to a good start       Expected Outcomes Will continue to monitor pt and progress workloads as tolerated without sign or symptom                Discharge Exercise Prescription (Final Exercise Prescription Changes):  Exercise  Prescription Changes - 01/28/23 1625       Response to Exercise   Blood Pressure (Admit) 120/58    Blood Pressure (Exercise) 102/70    Blood Pressure (Exit) 92/56    Heart Rate (Admit) 75 bpm    Heart Rate (Exercise) 93 bpm    Heart Rate (Exit) 80 bpm    Rating of Perceived Exertion (Exercise) 11.5    Perceived Dyspnea (Exercise) 0    Symptoms 0    Comments Pt first day in teh Pritikin ICR program    Duration Progress to 30 minutes of  aerobic without signs/symptoms of physical distress    Intensity THRR unchanged      Progression   Progression Continue to progress workloads to maintain intensity without signs/symptoms of physical distress.    Average METs 1.85      Resistance Training   Training Prescription Yes    Weight 2    Reps 10-15    Time 10 Minutes      Recumbant Bike   Level 1    Minutes 15    METs 2      NuStep   Level 1    SPM 75    Minutes 15    METs 1.7             Nutrition:  Target Goals: Understanding of nutrition guidelines, daily intake of sodium 1500mg , cholesterol 200mg , calories 30% from fat and 7% or less from saturated fats, daily to have 5 or more servings of fruits and vegetables.  Biometrics:  Pre Biometrics - 01/24/23 1523       Pre Biometrics   Waist Circumference 50 inches    Hip Circumference 48.5 inches    Waist to Hip Ratio 1.03 %    Triceps Skinfold 40 mm    % Body Fat 49.6 %    Grip Strength 24 kg    Flexibility 17 in    Single Leg Stand 5.82 seconds  Nutrition Therapy Plan and Nutrition Goals:  Nutrition Therapy & Goals - 01/28/23 1553       Nutrition Therapy   Diet Heart Healthy/Bariatric Diet      Personal Nutrition Goals   Nutrition Goal Patient to identify strategies for reducing cardiovascular risk by attending the Pritikin education and nutrition series weekly.    Personal Goal #2 Patient to improve diet quality by using the plate method as a guide for meal planning to include lean  protein/plant protein, fruits, vegetables, whole grains, nonfat dairy as part of a well-balanced diet.    Personal Goal #3 Patient to limit sodium to 1500mg  per day    Personal Goal #4 Patient to begin bariatric supplements daily.    Comments Karen Lutz is s/p duodenal switch single anastamosis on 06/06/2022 at 316#; she is down 90# in ~38months (28% body weight). She is not currently taking any bariatric supplements as she reports not tolerating the chewable options recommended at [facility] weight management; will go over additional options. She continues one protein shake daily (Premier, 30g protein). Patient will benefit from participation in intensive cardiac rehab for nutrition, exercise, and lifestyle modification.      Intervention Plan   Intervention Prescribe, educate and counsel regarding individualized specific dietary modifications aiming towards targeted core components such as weight, hypertension, lipid management, diabetes, heart failure and other comorbidities.;Nutrition handout(s) given to patient.    Expected Outcomes Short Term Goal: Understand basic principles of dietary content, such as calories, fat, sodium, cholesterol and nutrients.;Long Term Goal: Adherence to prescribed nutrition plan.             Nutrition Assessments:  MEDIFICTS Score Key: ?70 Need to make dietary changes  40-70 Heart Healthy Diet ? 40 Therapeutic Level Cholesterol Diet    Picture Your Plate Scores: <09 Unhealthy dietary pattern with much room for improvement. 41-50 Dietary pattern unlikely to meet recommendations for good health and room for improvement. 51-60 More healthful dietary pattern, with some room for improvement.  >60 Healthy dietary pattern, although there may be some specific behaviors that could be improved.    Nutrition Goals Re-Evaluation:  Nutrition Goals Re-Evaluation     Row Name 01/28/23 1553             Goals   Current Weight 226 lb 3.1 oz (102.6 kg)       Comment  GFR 56, Cr 1.17, BNP 156       Expected Outcome Karen Lutz is s/p duodenal switch single anastamosis on 06/06/2022 at 316#; she is down 90# in ~55months (28% body weight). She is not currently taking any bariatric supplements as she reports not tolerating the chewable options recommended at [facility] weight management; stressed the importance of appropriate supplementation and will go over additional supplement options. She continues one protein shake daily (Premier, 30g protein). Patient will benefit from participation in intensive cardiac rehab for nutrition, exercise, and lifestyle modification.                Nutrition Goals Re-Evaluation:  Nutrition Goals Re-Evaluation     Row Name 01/28/23 1553             Goals   Current Weight 226 lb 3.1 oz (102.6 kg)       Comment GFR 56, Cr 1.17, BNP 156       Expected Outcome Karen Lutz is s/p duodenal switch single anastamosis on 06/06/2022 at 316#; she is down 90# in ~32months (28% body weight). She is not currently taking any bariatric supplements  as she reports not tolerating the chewable options recommended at [facility] weight management; stressed the importance of appropriate supplementation and will go over additional supplement options. She continues one protein shake daily (Premier, 30g protein). Patient will benefit from participation in intensive cardiac rehab for nutrition, exercise, and lifestyle modification.                Nutrition Goals Discharge (Final Nutrition Goals Re-Evaluation):  Nutrition Goals Re-Evaluation - 01/28/23 1553       Goals   Current Weight 226 lb 3.1 oz (102.6 kg)    Comment GFR 56, Cr 1.17, BNP 156    Expected Outcome Karen Lutz is s/p duodenal switch single anastamosis on 06/06/2022 at 316#; she is down 90# in ~34months (28% body weight). She is not currently taking any bariatric supplements as she reports not tolerating the chewable options recommended at [facility] weight management; stressed the importance of  appropriate supplementation and will go over additional supplement options. She continues one protein shake daily (Premier, 30g protein). Patient will benefit from participation in intensive cardiac rehab for nutrition, exercise, and lifestyle modification.             Psychosocial: Target Goals: Acknowledge presence or absence of significant depression and/or stress, maximize coping skills, provide positive support system. Participant is able to verbalize types and ability to use techniques and skills needed for reducing stress and depression.  Initial Review & Psychosocial Screening:  Initial Psych Review & Screening - 01/24/23 1451       Initial Review   Current issues with History of Depression;Current Stress Concerns    Source of Stress Concerns Family;Chronic Illness    Comments Karen Lutz denies being depressed currently depressed. Karen Lutz's signifigant other is currently in [place], he will be relaeased in August and is going to stay with his mother. Karen Lutz says she is doing better than she was a few months ago.      Family Dynamics   Good Support System? Yes   Yelonda has her sisters for support. Karen Lutz has her children for support also wholive in the area.     Barriers   Psychosocial barriers to participate in program The patient should benefit from training in stress management and relaxation.      Screening Interventions   Interventions Encouraged to exercise;Provide feedback about the scores to participant    Expected Outcomes Long Term Goal: Stressors or current issues are controlled or eliminated.;Short Term goal: Identification and review with participant of any Quality of Life or Depression concerns found by scoring the questionnaire.             Quality of Life Scores:  Quality of Life - 01/24/23 1528       Quality of Life   Select Quality of Life      Quality of Life Scores   Health/Function Pre 22.13 %    Socioeconomic Pre 17.5 %    Psych/Spiritual Pre  15.5 %    Family Pre 27.3 %    GLOBAL Pre 20.67 %            Scores of 19 and below usually indicate a poorer quality of life in these areas.  A difference of  2-3 points is a clinically meaningful difference.  A difference of 2-3 points in the total score of the Quality of Life Index has been associated with significant improvement in overall quality of life, self-image, physical symptoms, and general health in studies assessing change in quality of life.  PHQ-9:  Review Flowsheet       01/24/2023 02/15/2011  Depression screen PHQ 2/9  Decreased Interest 1 3  Down, Depressed, Hopeless 1 3  PHQ - 2 Score 2 6  Altered sleeping 0 3  Tired, decreased energy 1 3  Change in appetite 2 3  Feeling bad or failure about yourself  1 3  Trouble concentrating 1 0  Moving slowly or fidgety/restless 0 3  Suicidal thoughts 0 -  PHQ-9 Score 7 21  Difficult doing work/chores Not difficult at all -   Interpretation of Total Score  Total Score Depression Severity:  1-4 = Minimal depression, 5-9 = Mild depression, 10-14 = Moderate depression, 15-19 = Moderately severe depression, 20-27 = Severe depression   Psychosocial Evaluation and Intervention:   Psychosocial Re-Evaluation:  Psychosocial Re-Evaluation     Row Name 01/29/23 [phone]             Psychosocial Re-Evaluation   Current issues with Current Depression;Current Stress Concerns;History of Depression       Comments Reviewed quality of life questionniare and PHQ2-9. Flonnie admits to being depressed at times. Denies the need for counseling. Karen Lutz continues to have good support from her family       Expected Outcomes Karen Lutz will have controlled or decreased depression upon completion of intensive cardiac rehab       Interventions Relaxation education;Encouraged to attend Cardiac Rehabilitation for the exercise;Stress management education       Continue Psychosocial Services  Follow up required by staff         Initial Review    Source of Stress Concerns Chronic Illness;Financial       Comments Will continue to monitor and noffer support as needed                Psychosocial Discharge (Final Psychosocial Re-Evaluation):  Psychosocial Re-Evaluation - 01/29/23 0109       Psychosocial Re-Evaluation   Current issues with Current Depression;Current Stress Concerns;History of Depression    Comments Reviewed quality of life questionniare and PHQ2-9. Kaitlee admits to being depressed at times. Denies the need for counseling. Karen Lutz continues to have good support from her family    Expected Outcomes Karen Lutz will have controlled or decreased depression upon completion of intensive cardiac rehab    Interventions Relaxation education;Encouraged to attend Cardiac Rehabilitation for the exercise;Stress management education    Continue Psychosocial Services  Follow up required by staff      Initial Review   Source of Stress Concerns Chronic Illness;Financial    Comments Will continue to monitor and noffer support as needed             Vocational Rehabilitation: Provide vocational rehab assistance to qualifying candidates.   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 Participant will state understanding/return demonstration of topics presented.    Education     Row Name 01/28/23 1500     Education   Cardiac Education Topics Pritikin   Geographical information systems officer Exercise   Exercise Workshop Exercise Basics: Scientist, product/process development  Plan   Instruction Review Code 1- Verbalizes Understanding   Class Start Time  1404   Class Stop Time 1448   Class Time Calculation (min) 44 min    Row Name 01/30/23 1146     Education   Cardiac Education Topics Pritikin   Customer service manager   Weekly Topic Tasty Appetizers and Snacks   Instruction Review Code 1- Verbalizes Understanding   Class Start Time 1400   Class Stop Time 1440   Class Time Calculation (min) 40 min            Core Videos: Exercise    Move It!  Clinical staff conducted group or individual video education with verbal and written material and guidebook.  Patient learns the recommended Pritikin exercise program. Exercise with the goal of living a long, healthy life. Some of the health benefits of exercise include controlled diabetes, healthier blood pressure levels, improved cholesterol levels, improved heart and lung capacity, improved sleep, and better body composition. Everyone should speak with their doctor before starting or changing an exercise routine.  Biomechanical Limitations Clinical staff conducted group or individual video education with verbal and written material and guidebook.  Patient learns how biomechanical limitations can impact exercise and how we can mitigate and possibly overcome limitations to have an impactful and balanced exercise routine.  Body Composition Clinical staff conducted group or individual video education with verbal and written material and guidebook.  Patient learns that body composition (ratio of muscle mass to fat mass) is a key component to assessing overall fitness, rather than body weight alone. Increased fat mass, especially visceral belly fat, can put [place] at increased risk for metabolic syndrome, type 2 diabetes, heart disease, and even death. It is recommended to combine diet and exercise (cardiovascular and resistance training) to improve your body composition. Seek guidance from your physician and exercise physiologist before implementing an exercise  routine.  Exercise Action Plan Clinical staff conducted group or individual video education with verbal and written material and guidebook.  Patient learns the recommended strategies to achieve and enjoy long-term exercise adherence, including variety, self-motivation, self-efficacy, and positive decision making. Benefits of exercise include fitness, good health, weight management, more energy, better sleep, less stress, and overall well-being.  Medical   Heart Disease Risk Reduction Clinical staff conducted group or individual video education with verbal and written material and guidebook.  Patient learns our heart is our most vital organ as it circulates oxygen, nutrients, white blood cells, and hormones throughout the entire body, and carries waste away. Data supports a plant-based eating plan like the Pritikin Program for its effectiveness in slowing progression of and reversing heart disease. The video provides a number of recommendations to address heart disease.   Metabolic Syndrome and Belly Fat  Clinical staff conducted group or individual video education with verbal and written material and guidebook.  Patient learns what metabolic syndrome is, how it leads to heart disease, and how one can reverse it and keep it from coming back. You have metabolic syndrome if you have 3 of the following 5 criteria: abdominal obesity, high blood pressure, high triglycerides, low HDL cholesterol, and high blood sugar.  Hypertension and Heart Disease Clinical staff conducted group or individual video education with verbal and written material and guidebook.  Patient learns that high blood pressure, or hypertension, is very common in the [place]. Hypertension is largely due to excessive salt intake, but other important risk  factors include being overweight, physical inactivity, drinking too much alcohol, smoking, and not eating enough potassium from fruits and vegetables. High blood pressure is a  leading risk factor for heart attack, stroke, congestive heart failure, dementia, kidney failure, and premature death. Long-term effects of excessive salt intake include stiffening of the arteries and thickening of heart muscle and organ damage. Recommendations include ways to reduce hypertension and the risk of heart disease.  Diseases of Our Time - Focusing on Diabetes Clinical staff conducted group or individual video education with verbal and written material and guidebook.  Patient learns why the best way to stop diseases of our time is prevention, through food and other lifestyle changes. Medicine (such as prescription pills and surgeries) is often only a Band-Aid on the problem, not a long-term solution. Most common diseases of our time include obesity, type 2 diabetes, hypertension, heart disease, and cancer. The Pritikin Program is recommended and has been proven to help reduce, reverse, and/or prevent the damaging effects of metabolic syndrome.  Nutrition   Overview of the Pritikin Eating Plan  Clinical staff conducted group or individual video education with verbal and written material and guidebook.  Patient learns about the Pritikin Eating Plan for disease risk reduction. The Pritikin Eating Plan emphasizes a wide variety of unrefined, minimally-processed carbohydrates, like fruits, vegetables, whole grains, and legumes. Go, Caution, and Stop food choices are explained. Plant-based and lean animal proteins are emphasized. Rationale provided for low sodium intake for blood pressure control, low added sugars for blood sugar stabilization, and low added fats and oils for coronary artery disease risk reduction and weight management.  Calorie Density  Clinical staff conducted group or individual video education with verbal and written material and guidebook.  Patient learns about calorie density and how it impacts the Pritikin Eating Plan. Knowing the characteristics of the food you choose will  help you decide whether those foods will lead to weight gain or weight loss, and whether you want to consume more or less of them. Weight loss is usually a side effect of the Pritikin Eating Plan because of its focus on low calorie-dense foods.  Label Reading  Clinical staff conducted group or individual video education with verbal and written material and guidebook.  Patient learns about the Pritikin recommended label reading guidelines and corresponding recommendations regarding calorie density, added sugars, sodium content, and whole grains.  Dining Out - Part 1  Clinical staff conducted group or individual video education with verbal and written material and guidebook.  Patient learns that restaurant meals can be sabotaging because they can be so high in calories, fat, sodium, and/or sugar. Patient learns recommended strategies on how to positively address this and avoid unhealthy pitfalls.  Facts on Fats  Clinical staff conducted group or individual video education with verbal and written material and guidebook.  Patient learns that lifestyle modifications can be just as effective, if not more so, as many medications for lowering your risk of heart disease. A Pritikin lifestyle can help to reduce your risk of inflammation and atherosclerosis (cholesterol build-up, or plaque, in the artery walls). Lifestyle interventions such as dietary choices and physical activity address the cause of atherosclerosis. A review of the types of fats and their impact on blood cholesterol levels, along with dietary recommendations to reduce fat intake is also included.  Nutrition Action Plan  Clinical staff conducted group or individual video education with verbal and written material and guidebook.  Patient learns how to incorporate Pritikin recommendations into their  lifestyle. Recommendations include planning and keeping personal health goals in mind as an important part of their success.  Healthy Mind-Set     Healthy Minds, Bodies, Hearts  Clinical staff conducted group or individual video education with verbal and written material and guidebook.  Patient learns how to identify when they are stressed. Video will discuss the impact of that stress, as well as the many benefits of stress management. Patient will also be introduced to stress management techniques. The way we think, act, and feel has an impact on our hearts.  How Our Thoughts Can Heal Our Hearts  Clinical staff conducted group or individual video education with verbal and written material and guidebook.  Patient learns that negative thoughts can cause depression and anxiety. This can result in negative lifestyle behavior and serious health problems. Cognitive behavioral therapy is an effective method to help control our thoughts in order to change and improve our emotional outlook.  Additional Videos:  Exercise    Improving Performance  Clinical staff conducted group or individual video education with verbal and written material and guidebook.  Patient learns to use a non-linear approach by alternating intensity levels and lengths of time spent exercising to help burn more calories and lose more body fat. Cardiovascular exercise helps improve heart health, metabolism, hormonal balance, blood sugar control, and recovery from fatigue. Resistance training improves strength, endurance, balance, coordination, reaction time, metabolism, and muscle mass. Flexibility exercise improves circulation, posture, and balance. Seek guidance from your physician and exercise physiologist before implementing an exercise routine and learn your capabilities and proper form for all exercise.  Introduction to Yoga  Clinical staff conducted group or individual video education with verbal and written material and guidebook.  Patient learns about yoga, a discipline of the coming together of mind, breath, and body. The benefits of yoga include improved flexibility,  improved range of motion, better posture and core strength, increased lung function, weight loss, and positive self-image. Yoga's heart health benefits include lowered blood pressure, healthier heart rate, decreased cholesterol and triglyceride levels, improved immune function, and reduced stress. Seek guidance from your physician and exercise physiologist before implementing an exercise routine and learn your capabilities and proper form for all exercise.  Medical   Aging: Enhancing Your Quality of Life  Clinical staff conducted group or individual video education with verbal and written material and guidebook.  Patient learns key strategies and recommendations to stay in good physical health and enhance quality of life, such as prevention strategies, having an advocate, securing a Health Care Proxy and Power of Attorney, and keeping a list of medications and system for tracking them. It also discusses how to avoid risk for bone loss.  Biology of Weight Control  Clinical staff conducted group or individual video education with verbal and written material and guidebook.  Patient learns that weight gain occurs because we consume more calories than we burn (eating more, moving less). Even if your body weight is normal, you may have higher ratios of fat compared to muscle mass. Too much body fat puts you at increased risk for cardiovascular disease, heart attack, stroke, type 2 diabetes, and obesity-related cancers. In addition to exercise, following the Pritikin Eating Plan can help reduce your risk.  Decoding Lab Results  Clinical staff conducted group or individual video education with verbal and written material and guidebook.  Patient learns that lab test reflects one measurement whose values change over time and are influenced by many factors, including medication, stress, sleep, exercise, food,  hydration, pre-existing medical conditions, and more. It is recommended to use the knowledge from this  video to become more involved with your lab results and evaluate your numbers to speak with your doctor.   Diseases of Our Time - Overview  Clinical staff conducted group or individual video education with verbal and written material and guidebook.  Patient learns that according to the CDC, 50% to 70% of chronic diseases (such as obesity, type 2 diabetes, elevated lipids, hypertension, and heart disease) are avoidable through lifestyle improvements including healthier food choices, listening to satiety cues, and increased physical activity.  Sleep Disorders Clinical staff conducted group or individual video education with verbal and written material and guidebook.  Patient learns how good quality and duration of sleep are important to overall health and well-being. Patient also learns about sleep disorders and how they impact health along with recommendations to address them, including discussing with a physician.  Nutrition  Dining Out - Part 2 Clinical staff conducted group or individual video education with verbal and written material and guidebook.  Patient learns how to plan ahead and communicate in order to maximize their dining experience in a healthy and nutritious manner. Included are recommended food choices based on the type of restaurant the patient is visiting.   Fueling a Banker conducted group or individual video education with verbal and written material and guidebook.  There is a strong connection between our food choices and our health. Diseases like obesity and type 2 diabetes are very prevalent and are in large-part due to lifestyle choices. The Pritikin Eating Plan provides plenty of food and hunger-curbing satisfaction. It is easy to follow, affordable, and helps reduce health risks.  Menu Workshop  Clinical staff conducted group or individual video education with verbal and written material and guidebook.  Patient learns that restaurant meals can  sabotage health goals because they are often packed with calories, fat, sodium, and sugar. Recommendations include strategies to plan ahead and to communicate with the manager, chef, or server to help order a healthier meal.  Planning Your Eating Strategy  Clinical staff conducted group or individual video education with verbal and written material and guidebook.  Patient learns about the Pritikin Eating Plan and its benefit of reducing the risk of disease. The Pritikin Eating Plan does not focus on calories. Instead, it emphasizes high-quality, nutrient-rich foods. By knowing the characteristics of the foods, we choose, we can determine their calorie density and make informed decisions.  Targeting Your Nutrition Priorities  Clinical staff conducted group or individual video education with verbal and written material and guidebook.  Patient learns that lifestyle habits have a tremendous impact on disease risk and progression. This video provides eating and physical activity recommendations based on your personal health goals, such as reducing LDL cholesterol, losing weight, preventing or controlling type 2 diabetes, and reducing high blood pressure.  Vitamins and Minerals  Clinical staff conducted group or individual video education with verbal and written material and guidebook.  Patient learns different ways to obtain key vitamins and minerals, including through a recommended healthy diet. It is important to discuss all supplements you take with your doctor.   Healthy Mind-Set    Smoking Cessation  Clinical staff conducted group or individual video education with verbal and written material and guidebook.  Patient learns that cigarette smoking and tobacco addiction pose a serious health risk which affects millions of people. Stopping smoking will significantly reduce the risk of heart disease, lung disease,  and many forms of cancer. Recommended strategies for quitting are covered, including  working with your doctor to develop a successful plan.  Culinary   Becoming a Set designer conducted group or individual video education with verbal and written material and guidebook.  Patient learns that cooking at home can be healthy, cost-effective, quick, and puts them in control. Keys to cooking healthy recipes will include looking at your recipe, assessing your equipment needs, planning ahead, making it simple, choosing cost-effective seasonal ingredients, and limiting the use of added fats, salts, and sugars.  Cooking - Breakfast and Snacks  Clinical staff conducted group or individual video education with verbal and written material and guidebook.  Patient learns how important breakfast is to satiety and nutrition through the entire day. Recommendations include key foods to eat during breakfast to help stabilize blood sugar levels and to prevent overeating at meals later in the day. Planning ahead is also a key component.  Cooking - Educational psychologist conducted group or individual video education with verbal and written material and guidebook.  Patient learns eating strategies to improve overall health, including an approach to cook more at home. Recommendations include thinking of animal protein as a side on your plate rather than center stage and focusing instead on lower calorie dense options like vegetables, fruits, whole grains, and plant-based proteins, such as beans. Making sauces in large quantities to freeze for later and leaving the skin on your vegetables are also recommended to maximize your experience.  Cooking - Healthy Salads and Dressing Clinical staff conducted group or individual video education with verbal and written material and guidebook.  Patient learns that vegetables, fruits, whole grains, and legumes are the foundations of the Pritikin Eating Plan. Recommendations include how to incorporate each of these in flavorful and healthy  salads, and how to create homemade salad dressings. Proper handling of ingredients is also covered. Cooking - Soups and [employer] - Soups and Desserts Clinical staff conducted group or individual video education with verbal and written material and guidebook.  Patient learns that Pritikin soups and desserts make for easy, nutritious, and delicious snacks and meal components that are low in sodium, fat, sugar, and calorie density, while high in vitamins, minerals, and filling fiber. Recommendations include simple and healthy ideas for soups and desserts.   Overview     The Pritikin Solution Program Overview Clinical staff conducted group or individual video education with verbal and written material and guidebook.  Patient learns that the results of the Pritikin Program have been documented in more than 100 articles published in peer-reviewed journals, and the benefits include reducing risk factors for (and, in some cases, even reversing) high cholesterol, high blood pressure, type 2 diabetes, obesity, and more! An overview of the three key pillars of the Pritikin Program will be covered: eating well, doing regular exercise, and having a healthy mind-set.  WORKSHOPS  Exercise: Exercise Basics: Building Your Action Plan Clinical staff led group instruction and group discussion with PowerPoint presentation and patient guidebook. To enhance the learning environment the use of posters, models and videos may be added. At the conclusion of this workshop, patients will comprehend the difference between physical activity and exercise, as well as the benefits of incorporating both, into their routine. Patients will understand the FITT (Frequency, Intensity, Time, and Type) principle and how to use it to build an exercise action plan. In addition, safety concerns and other considerations for exercise and cardiac rehab  will be addressed by the presenter. The purpose of this lesson is to promote a  comprehensive and effective weekly exercise routine in order to improve patients' overall level of fitness.   Managing Heart Disease: Your Path to a Healthier Heart Clinical staff led group instruction and group discussion with PowerPoint presentation and patient guidebook. To enhance the learning environment the use of posters, models and videos may be added.At the conclusion of this workshop, patients will understand the anatomy and physiology of the heart. Additionally, they will understand how Pritikin's three pillars impact the risk factors, the progression, and the management of heart disease.  The purpose of this lesson is to provide a high-level overview of the heart, heart disease, and how the Pritikin lifestyle positively impacts risk factors.  Exercise Biomechanics Clinical staff led group instruction and group discussion with PowerPoint presentation and patient guidebook. To enhance the learning environment the use of posters, models and videos may be added. Patients will learn how the structural parts of their bodies function and how these functions impact their daily activities, movement, and exercise. Patients will learn how to promote a neutral spine, learn how to manage pain, and identify ways to improve their physical movement in order to promote healthy living. The purpose of this lesson is to expose patients to common physical limitations that impact physical activity. Participants will learn practical ways to adapt and manage aches and pains, and to minimize their effect on regular exercise. Patients will learn how to maintain good posture while sitting, walking, and lifting.  Balance Training and Fall Prevention  Clinical staff led group instruction and group discussion with PowerPoint presentation and patient guidebook. To enhance the learning environment the use of posters, models and videos may be added. At the conclusion of this workshop, patients will understand the  importance of their sensorimotor skills (vision, proprioception, and the vestibular system) in maintaining their ability to balance as they age. Patients will apply a variety of balancing exercises that are appropriate for their current level of function. Patients will understand the common causes for poor balance, possible solutions to these problems, and ways to modify their physical environment in order to minimize their fall risk. The purpose of this lesson is to teach patients about the importance of maintaining balance as they age and ways to minimize their risk of falling.  WORKSHOPS   Nutrition:  Fueling a Ship broker led group instruction and group discussion with PowerPoint presentation and patient guidebook. To enhance the learning environment the use of posters, models and videos may be added. Patients will review the foundational principles of the Pritikin Eating Plan and understand what constitutes a serving size in each of the food groups. Patients will also learn Pritikin-friendly foods that are better choices when away from home and review make-ahead meal and snack options. Calorie density will be reviewed and applied to three nutrition priorities: weight maintenance, weight loss, and weight gain. The purpose of this lesson is to reinforce (in a group setting) the key concepts around what patients are recommended to eat and how to apply these guidelines when away from home by planning and selecting Pritikin-friendly options. Patients will understand how calorie density may be adjusted for different weight management goals.  Mindful Eating  Clinical staff led group instruction and group discussion with PowerPoint presentation and patient guidebook. To enhance the learning environment the use of posters, models and videos may be added. Patients will briefly review the concepts of the Pritikin Eating Plan  and the importance of low-calorie dense foods. The concept of mindful  eating will be introduced as well as the importance of paying attention to internal hunger signals. Triggers for non-hunger eating and techniques for dealing with triggers will be explored. The purpose of this lesson is to provide patients with the opportunity to review the basic principles of the Pritikin Eating Plan, discuss the value of eating mindfully and how to measure internal cues of hunger and fullness using the Hunger Scale. Patients will also discuss reasons for non-hunger eating and learn strategies to use for controlling emotional eating.  Targeting Your Nutrition Priorities Clinical staff led group instruction and group discussion with PowerPoint presentation and patient guidebook. To enhance the learning environment the use of posters, models and videos may be added. Patients will learn how to determine their genetic susceptibility to disease by reviewing their family history. Patients will gain insight into the importance of diet as part of an overall healthy lifestyle in mitigating the impact of genetics and other environmental insults. The purpose of this lesson is to provide patients with the opportunity to assess their personal nutrition priorities by looking at their family history, their own health history and current risk factors. Patients will also be able to discuss ways of prioritizing and modifying the Pritikin Eating Plan for their highest risk areas  Menu  Clinical staff led group instruction and group discussion with PowerPoint presentation and patient guidebook. To enhance the learning environment the use of posters, models and videos may be added. Using menus brought in from [employer], or printed from [employer], patients will apply the Pritikin dining out guidelines that were presented in the [employer] video. Patients will also be able to practice these guidelines in a variety of provided scenarios. The purpose of this lesson is to provide  patients with the opportunity to practice hands-on learning of the Pritikin Dining Out guidelines with actual menus and practice scenarios.  Label Reading Clinical staff led group instruction and group discussion with PowerPoint presentation and patient guidebook. To enhance the learning environment the use of posters, models and videos may be added. Patients will review and discuss the Pritikin label reading guidelines presented in Pritikin's Label Reading Educational series video. Using fool labels brought in from local grocery stores and markets, patients will apply the label reading guidelines and determine if the packaged food meet the Pritikin guidelines. The purpose of this lesson is to provide patients with the opportunity to review, discuss, and practice hands-on learning of the Pritikin Label Reading guidelines with actual packaged food labels. Cooking School  Pritikin's [employer] are designed to teach patients ways to prepare quick, simple, and affordable recipes at home. The importance of nutrition's role in chronic disease risk reduction is reflected in its emphasis in the overall Pritikin program. By learning how to prepare essential core Pritikin Eating Plan recipes, patients will increase control over what they eat; be able to customize the flavor of foods without the use of added salt, sugar, or fat; and improve the quality of the food they consume. By learning a set of core recipes which are easily assembled, quickly prepared, and affordable, patients are more likely to prepare more healthy foods at home. These workshops focus on convenient breakfasts, simple entres, side dishes, and desserts which can be prepared with minimal effort and are consistent with nutrition recommendations for cardiovascular risk reduction. Cooking [employer] are taught by a Armed forces logistics/support/administrative officer (RD) who has been  trained by the [employer] team. The chef or RD has a clear  understanding of the importance of minimizing - if not completely eliminating - added fat, sugar, and sodium in recipes. Throughout the series of Cooking School Workshop sessions, patients will learn about healthy ingredients and efficient methods of cooking to build confidence in their capability to prepare    Cooking School weekly topics:  Adding Flavor- Sodium-Free  Fast and Healthy Breakfasts  Powerhouse Plant-Based Proteins  Satisfying Salads and Dressings  Simple Sides and Sauces  International Cuisine-Spotlight on the [employer] Zones  Delicious Desserts  Savory Soups  [employer] - Meals in a Astronomer Appetizers and Snacks  Comforting Weekend Breakfasts  One-Pot Wonders   Fast Evening Meals  Landscape architect Your Pritikin Plate  WORKSHOPS   Healthy Mindset (Psychosocial):  Focused Goals, Sustainable Changes Clinical staff led group instruction and group discussion with PowerPoint presentation and patient guidebook. To enhance the learning environment the use of posters, models and videos may be added. Patients will be able to apply effective goal setting strategies to establish at least one personal goal, and then take consistent, meaningful action toward that goal. They will learn to identify common barriers to achieving personal goals and develop strategies to overcome them. Patients will also gain an understanding of how our mind-set can impact our ability to achieve goals and the importance of cultivating a positive and growth-oriented mind-set. The purpose of this lesson is to provide patients with a deeper understanding of how to set and achieve personal goals, as well as the tools and strategies needed to overcome common obstacles which may arise along the way.  From Head to Heart: The Power of a Healthy Outlook  Clinical staff led group instruction and group discussion with PowerPoint presentation and patient guidebook. To enhance the learning  environment the use of posters, models and videos may be added. Patients will be able to recognize and describe the impact of emotions and mood on physical health. They will discover the importance of self-care and explore self-care practices which may work for them. Patients will also learn how to utilize the 4 C's to cultivate a healthier outlook and better manage stress and challenges. The purpose of this lesson is to demonstrate to patients how a healthy outlook is an essential part of maintaining good health, especially as they continue their cardiac rehab journey.  Healthy Sleep for a Healthy Heart Clinical staff led group instruction and group discussion with PowerPoint presentation and patient guidebook. To enhance the learning environment the use of posters, models and videos may be added. At the conclusion of this workshop, patients will be able to demonstrate knowledge of the importance of sleep to overall health, well-being, and quality of life. They will understand the symptoms of, and treatments for, common sleep disorders. Patients will also be able to identify daytime and nighttime behaviors which impact sleep, and they will be able to apply these tools to help manage sleep-related challenges. The purpose of this lesson is to provide patients with a general overview of sleep and outline the importance of quality sleep. Patients will learn about a few of the most common sleep disorders. Patients will also be introduced to the concept of "sleep hygiene," and discover ways to self-manage certain sleeping problems through simple daily behavior changes. Finally, the workshop will motivate patients by clarifying the links between quality sleep and their goals of heart-healthy living.   Recognizing and Reducing Stress Clinical staff  led group instruction and group discussion with PowerPoint presentation and patient guidebook. To enhance the learning environment the use of posters, models and videos  may be added. At the conclusion of this workshop, patients will be able to understand the types of stress reactions, differentiate between acute and chronic stress, and recognize the impact that chronic stress has on their health. They will also be able to apply different coping mechanisms, such as reframing negative self-talk. Patients will have the opportunity to practice a variety of stress management techniques, such as deep abdominal breathing, progressive muscle relaxation, and/or guided imagery.  The purpose of this lesson is to educate patients on the role of stress in their lives and to provide healthy techniques for coping with it.  Learning Barriers/Preferences:  Learning Barriers/Preferences - 01/24/23 1529       Learning Barriers/Preferences   Learning Barriers Sight    Learning Preferences Computer/Internet;Group Instruction;Individual Instruction;Skilled Demonstration;Verbal Instruction             Education Topics:  Knowledge Questionnaire Score:  Knowledge Questionnaire Score - 01/24/23 1530       Knowledge Questionnaire Score   Pre Score 19/24             Core Components/Risk Factors/Patient Goals at Admission:  Personal Goals and Risk Factors at Admission - 01/24/23 1531       Core Components/Risk Factors/Patient Goals on Admission    Weight Management Yes;Obesity;Weight Loss    Intervention Weight Management: Develop a combined nutrition and exercise program designed to reach desired caloric intake, while maintaining appropriate intake of nutrient and fiber, sodium and fats, and appropriate energy expenditure required for the weight goal.;Weight Management: Provide education and appropriate resources to help participant work on and attain dietary goals.;Weight Management/Obesity: Establish reasonable short term and long term weight goals.;Obesity: Provide education and appropriate resources to help participant work on and attain dietary goals.    Admit Weight  226 lb 3.1 oz (102.6 kg)    Goal Weight: Long Term 200 lb (90.7 kg)    Expected Outcomes Short Term: Continue to assess and modify interventions until short term weight is achieved;Long Term: Adherence to nutrition and physical activity/exercise program aimed toward attainment of established weight goal;Weight Loss: Understanding of general recommendations for a balanced deficit meal plan, which promotes 1-2 lb weight loss per week and includes a negative energy balance of [phone] kcal/d;Understanding recommendations for meals to include 15-35% energy as protein, 25-35% energy from fat, 35-60% energy from carbohydrates, less than 200mg  of dietary cholesterol, 20-35 gm of total fiber daily;Understanding of distribution of calorie intake throughout the day with the consumption of 4-5 meals/snacks    Heart Failure Yes    Intervention Provide a combined exercise and nutrition program that is supplemented with education, support and counseling about heart failure. Directed toward relieving symptoms such as shortness of breath, decreased exercise tolerance, and extremity edema.    Expected Outcomes Improve functional capacity of life;Short term: Attendance in program 2-3 days a week with increased exercise capacity. Reported lower sodium intake. Reported increased fruit and vegetable intake. Reports medication compliance.;Short term: Daily weights obtained and reported for increase. Utilizing diuretic protocols set by physician.;Long term: Adoption of self-care skills and reduction of barriers for early signs and symptoms recognition and intervention leading to self-care maintenance.    Hypertension Yes    Intervention Provide education on lifestyle modifcations including regular physical activity/exercise, weight management, moderate sodium restriction and increased consumption of fresh fruit, vegetables, and low fat dairy,  alcohol moderation, and smoking cessation.;Monitor prescription use compliance.     Expected Outcomes Short Term: Continued assessment and intervention until BP is < 140/25mm HG in hypertensive participants. < 130/27mm HG in hypertensive participants with diabetes, heart failure or chronic kidney disease.;Long Term: Maintenance of blood pressure at goal levels.    Stress Yes    Intervention Offer individual and/or small group education and counseling on adjustment to heart disease, stress management and health-related lifestyle change. Teach and support self-help strategies.;Refer participants experiencing significant psychosocial distress to appropriate mental health specialists for further evaluation and treatment. When possible, include family members and significant others in education/counseling sessions.    Expected Outcomes Short Term: Participant demonstrates changes in health-related behavior, relaxation and other stress management skills, ability to obtain effective social support, and compliance with psychotropic medications if prescribed.;Long Term: Emotional wellbeing is indicated by absence of clinically significant psychosocial distress or social isolation.             Core Components/Risk Factors/Patient Goals Review:   Goals and Risk Factor Review     Row Name 01/29/23 2130 01/31/23 1139           Core Components/Risk Factors/Patient Goals Review   Personal Goals Review Weight Management/Obesity;Heart Failure;Hypertension;Stress Weight Management/Obesity;Heart Failure;Hypertension;Stress      Review Karen Lutz started intensive cardiac rehab on 01/28/23 and did well with exercise. Vital signs were stable. Systolic BP between 90-120. Karen Lutz did report some right knee pain Karen Lutz started intensive cardiac rehab on 01/28/23 and is off to a good start  with exercise. Obtained parameters to exercise for systolic BP greater than 90. Blood pressures are improved since aldactdone dose has been decreased      Expected Outcomes Karen Lutz will continue to participate in  intensive cardiac rehab for exercise, nutrition and lifestyle modifications Karen Lutz will continue to participate in intensive cardiac rehab for exercise, nutrition and lifestyle modifications               Core Components/Risk Factors/Patient Goals at Discharge (Final Review):   Goals and Risk Factor Review - 01/31/23 1139       Core Components/Risk Factors/Patient Goals Review   Personal Goals Review Weight Management/Obesity;Heart Failure;Hypertension;Stress    Review Karen Lutz started intensive cardiac rehab on 01/28/23 and is off to a good start  with exercise. Obtained parameters to exercise for systolic BP greater than 90. Blood pressures are improved since aldactdone dose has been decreased    Expected Outcomes Karen Lutz will continue to participate in intensive cardiac rehab for exercise, nutrition and lifestyle modifications             ITP Comments:  ITP Comments     Row Name 01/24/23 1415 01/29/23 0757         ITP Comments Dr. Armanda Magic medical director. Introduction to pritikin education program/ intensive cardiac rehab. Initial orientation packet reviewed with patient. 30 Day ITP Review.  Shanisha started intensive cardiac rehab on 01/28/23 and did well with exercise.               Comments: See ITP comments.Thayer Headings RN BSN

## 2023-02-01 ENCOUNTER — Encounter (HOSPITAL_COMMUNITY): Payer: BC Managed Care – PPO

## 2023-02-01 ENCOUNTER — Other Ambulatory Visit (HOSPITAL_COMMUNITY): Payer: Self-pay | Admitting: Cardiology

## 2023-02-01 DIAGNOSIS — I5042 Chronic combined systolic (congestive) and diastolic (congestive) heart failure: Secondary | ICD-10-CM

## 2023-02-04 ENCOUNTER — Encounter (HOSPITAL_COMMUNITY)
Admission: RE | Admit: 2023-02-04 | Discharge: 2023-02-04 | Disposition: A | Discharge status: Home / Self Care | Payer: BC Managed Care – PPO | Source: Ambulatory Visit | Attending: Cardiology | Admitting: Cardiology

## 2023-02-04 DIAGNOSIS — Z95810 Presence of automatic (implantable) cardiac defibrillator: Secondary | ICD-10-CM | POA: Diagnosis not present

## 2023-02-04 DIAGNOSIS — I5042 Chronic combined systolic (congestive) and diastolic (congestive) heart failure: Secondary | ICD-10-CM | POA: Insufficient documentation

## 2023-02-04 DIAGNOSIS — I428 Other cardiomyopathies: Secondary | ICD-10-CM | POA: Diagnosis present

## 2023-02-04 DIAGNOSIS — I340 Nonrheumatic mitral (valve) insufficiency: Secondary | ICD-10-CM | POA: Diagnosis not present

## 2023-02-04 DIAGNOSIS — Z48812 Encounter for surgical aftercare following surgery on the circulatory system: Secondary | ICD-10-CM | POA: Insufficient documentation

## 2023-02-06 ENCOUNTER — Ambulatory Visit (HOSPITAL_BASED_OUTPATIENT_CLINIC_OR_DEPARTMENT_OTHER)
Admission: RE | Admit: 2023-02-06 | Discharge: 2023-02-06 | Disposition: A | Discharge status: Home / Self Care | Payer: BC Managed Care – PPO | Source: Ambulatory Visit

## 2023-02-06 ENCOUNTER — Encounter (HOSPITAL_COMMUNITY)
Admission: RE | Admit: 2023-02-06 | Discharge: 2023-02-06 | Disposition: A | Discharge status: Home / Self Care | Payer: BC Managed Care – PPO | Source: Ambulatory Visit

## 2023-02-06 DIAGNOSIS — I5042 Chronic combined systolic (congestive) and diastolic (congestive) heart failure: Secondary | ICD-10-CM | POA: Diagnosis not present

## 2023-02-06 DIAGNOSIS — Z48812 Encounter for surgical aftercare following surgery on the circulatory system: Secondary | ICD-10-CM | POA: Diagnosis not present

## 2023-02-06 DIAGNOSIS — I428 Other cardiomyopathies: Secondary | ICD-10-CM

## 2023-02-06 DIAGNOSIS — I340 Nonrheumatic mitral (valve) insufficiency: Secondary | ICD-10-CM | POA: Insufficient documentation

## 2023-02-07 LAB — ECHOCARDIOGRAM COMPLETE
Area-P 1/2: 2.38 cm2
Calc EF: 31.9 %
S' Lateral: 5.1 cm
Single Plane A2C EF: 33 %
Single Plane A4C EF: 32.5 %

## 2023-02-08 ENCOUNTER — Encounter (HOSPITAL_COMMUNITY): Payer: BC Managed Care – PPO

## 2023-02-11 ENCOUNTER — Encounter (HOSPITAL_COMMUNITY)
Admission: RE | Admit: 2023-02-11 | Discharge: 2023-02-11 | Disposition: A | Discharge status: Home / Self Care | Payer: BC Managed Care – PPO | Source: Ambulatory Visit | Attending: Cardiology | Admitting: Cardiology

## 2023-02-11 ENCOUNTER — Ambulatory Visit (INDEPENDENT_AMBULATORY_CARE_PROVIDER_SITE_OTHER): Payer: BC Managed Care – PPO

## 2023-02-11 DIAGNOSIS — I469 Cardiac arrest, cause unspecified: Secondary | ICD-10-CM

## 2023-02-11 DIAGNOSIS — I428 Other cardiomyopathies: Secondary | ICD-10-CM

## 2023-02-11 DIAGNOSIS — Z48812 Encounter for surgical aftercare following surgery on the circulatory system: Secondary | ICD-10-CM | POA: Diagnosis not present

## 2023-02-11 LAB — CUP PACEART REMOTE DEVICE CHECK
Date Time Interrogation Session: 20240708101338
Implantable Lead Connection Status: 753985
Implantable Lead Implant Date: 20240405
Implantable Lead Location: 753860
Implantable Lead Model: 436909
Implantable Lead Serial Number: 81561050
Implantable Pulse Generator Implant Date: 20240405
Pulse Gen Model: 429525
Pulse Gen Serial Number: 84953112

## 2023-02-13 ENCOUNTER — Encounter (HOSPITAL_COMMUNITY): Payer: BC Managed Care – PPO

## 2023-02-15 ENCOUNTER — Encounter (HOSPITAL_COMMUNITY): Payer: BC Managed Care – PPO

## 2023-02-18 ENCOUNTER — Encounter (HOSPITAL_COMMUNITY)
Admission: RE | Admit: 2023-02-18 | Discharge: 2023-02-18 | Disposition: A | Discharge status: Home / Self Care | Payer: BC Managed Care – PPO | Source: Ambulatory Visit | Attending: Cardiology

## 2023-02-18 DIAGNOSIS — Z48812 Encounter for surgical aftercare following surgery on the circulatory system: Secondary | ICD-10-CM | POA: Diagnosis not present

## 2023-02-18 DIAGNOSIS — I428 Other cardiomyopathies: Secondary | ICD-10-CM

## 2023-02-20 ENCOUNTER — Encounter (HOSPITAL_COMMUNITY)
Admission: RE | Admit: 2023-02-20 | Discharge: 2023-02-20 | Discharge status: Home / Self Care | Payer: BC Managed Care – PPO | Source: Ambulatory Visit | Attending: Cardiology

## 2023-02-20 DIAGNOSIS — I428 Other cardiomyopathies: Secondary | ICD-10-CM

## 2023-02-20 DIAGNOSIS — Z48812 Encounter for surgical aftercare following surgery on the circulatory system: Secondary | ICD-10-CM | POA: Diagnosis not present

## 2023-02-22 ENCOUNTER — Ambulatory Visit (HOSPITAL_COMMUNITY)
Admission: RE | Admit: 2023-02-22 | Discharge: 2023-02-22 | Disposition: A | Discharge status: Home / Self Care | Payer: BC Managed Care – PPO | Source: Ambulatory Visit | Attending: Cardiology | Admitting: Cardiology

## 2023-02-22 ENCOUNTER — Telehealth (HOSPITAL_COMMUNITY): Payer: Self-pay

## 2023-02-22 VITALS — BP 102/70 | HR 58 | Wt 222.2 lb

## 2023-02-22 DIAGNOSIS — Z9884 Bariatric surgery status: Secondary | ICD-10-CM | POA: Diagnosis not present

## 2023-02-22 DIAGNOSIS — Z8674 Personal history of sudden cardiac arrest: Secondary | ICD-10-CM | POA: Diagnosis not present

## 2023-02-22 DIAGNOSIS — Z79899 Other long term (current) drug therapy: Secondary | ICD-10-CM | POA: Diagnosis not present

## 2023-02-22 DIAGNOSIS — I428 Other cardiomyopathies: Secondary | ICD-10-CM | POA: Insufficient documentation

## 2023-02-22 DIAGNOSIS — Z95810 Presence of automatic (implantable) cardiac defibrillator: Secondary | ICD-10-CM | POA: Insufficient documentation

## 2023-02-22 DIAGNOSIS — I4901 Ventricular fibrillation: Secondary | ICD-10-CM | POA: Diagnosis not present

## 2023-02-22 DIAGNOSIS — E669 Obesity, unspecified: Secondary | ICD-10-CM | POA: Diagnosis not present

## 2023-02-22 DIAGNOSIS — Z6838 Body mass index (BMI) 38.0-38.9, adult: Secondary | ICD-10-CM | POA: Diagnosis not present

## 2023-02-22 DIAGNOSIS — Z8249 Family history of ischemic heart disease and other diseases of the circulatory system: Secondary | ICD-10-CM | POA: Insufficient documentation

## 2023-02-22 DIAGNOSIS — I5042 Chronic combined systolic (congestive) and diastolic (congestive) heart failure: Secondary | ICD-10-CM | POA: Diagnosis not present

## 2023-02-22 LAB — BRAIN NATRIURETIC PEPTIDE: B Natriuretic Peptide: 191 pg/mL — ABNORMAL HIGH (ref 0.0–100.0)

## 2023-02-22 LAB — BASIC METABOLIC PANEL
Anion gap: 8 (ref 5–15)
BUN: 8 mg/dL (ref 6–20)
CO2: 27 mmol/L (ref 22–32)
Calcium: 9.7 mg/dL (ref 8.9–10.3)
Chloride: 105 mmol/L (ref 98–111)
Creatinine, Ser: 0.73 mg/dL (ref 0.44–1.00)
GFR, Estimated: >60 mL/min (ref >60)
Glucose, Bld: 83 mg/dL (ref 70–99)
Potassium: 3.3 mmol/L — ABNORMAL LOW (ref 3.5–5.1)
Sodium: 140 mmol/L (ref 135–145)

## 2023-02-22 MED ORDER — SPIRONOLACTONE 25 MG PO TABS: 25.0000 mg | ORAL_TABLET | Freq: Every evening | ORAL | 6 refills | Status: DC

## 2023-02-22 NOTE — Patient Instructions (Addendum)
There has been no changes to your medications.  Labs done today, your results will be available in MyChart, we will contact you for abnormal readings.  You have been [facility] for Ozempic. They will call you to arrange your appointment.   Your physician recommends that you schedule a follow-up appointment in: 3 months  ( October) ** PLEASE CALL THE OFFICE IN MID AUGUST TO ARRANGE YOUR FOLLOW UP APPOINTMENT. **  If you have any questions or concerns before your next appointment please send [place] a message through [place] or call our office at [phone].    TO LEAVE A MESSAGE FOR THE NURSE SELECT OPTION 2, PLEASE LEAVE A MESSAGE INCLUDING: YOUR NAME DATE OF BIRTH CALL BACK NUMBER REASON FOR CALL**this is important as we prioritize the call backs  YOU WILL RECEIVE A CALL BACK THE SAME DAY AS LONG AS YOU CALL BEFORE 4:00 PM  At the [facility], you and your health needs are our priority. As part of our continuing mission to provide you with exceptional heart care, we have created designated Provider Care Teams. These Care Teams include your primary Cardiologist (physician) and Advanced Practice Providers (APPs- Physician Assistants and Nurse Practitioners) who all work together to provide you with the care you need, when you need it.   You may see any of the following providers on your designated Care Team at your next follow up: Dr Arvilla Meres Dr Marca Ancona Dr. Marcos Eke, NP Robbie Lis, [place] [facility] [place], [place] Brynda Peon, NP Karle Plumber, PharmD   Please be sure to bring in all your medications bottles to every appointment.    Thank you for choosing [facility]

## 2023-02-22 NOTE — Telephone Encounter (Addendum)
  Pt aware, agreeable, and verbalized understanding  ----- Message from Karen Lutz sent at 02/22/2023  4:24 PM EDT ----- Can we ask Ms. Willingham to increase spironolactone 25mg  daily.

## 2023-02-22 NOTE — Progress Notes (Signed)
ADVANCED HEART FAILURE CLINIC NOTE  Referring Physician: Verlee Rossetti, PA-C  Primary Care: Verlee Rossetti, PA-C Primary Cardiologist: Dr. Gasper Lloyd  HPI: Karen Lutz is a 52 y.o. female with history of heart failure with reduced action fraction, obesity status post bariatric surgery in November 2023 at [facility] and a significant family history of early systolic heart failure that presents today to establish care.  Her cardiac history dates back to November 01, 2022 when she was found unresponsive by her boyfriend.  Her rhythm on arrival was ventricular fibrillation with an unknown downtime.  She underwent CPR for 23 minutes before ROSC.  On arrival she was found to be hypokalemic with a QTc of 511 ms with normalization after correction of hypokalemia.  She was intubated and placed on pressors with echocardiogram demonstrating EF of 25%, cardiac MRI with a EF of 22% and preserved RV function.  She had a Biotronik secondary prevention ICD placed was started on GDMT and discharged home 1 week later.   Interval history - Doing very well functionally; working at [employer]. Participating in cardiac rehab. Only complaint is left sided pain at ICD site.   Activity level/exercise tolerance: NYHA IIb Orthopnea:  Sleeps on 2 pillows Paroxysmal noctural dyspnea:  no Chest pain/pressure:  no Orthostatic lightheadedness:  no Palpitations:  no Lower extremity edema:  no Presyncope/syncope:  no Cough:  no  Past Medical History:  Diagnosis Date   Acid reflux    Anemia    Fibroids    Heart murmur    Knee pain    UTI (lower urinary tract infection)     Current Outpatient Medications  Medication Sig Dispense Refill   aspirin 81 MG chewable tablet Chew 1 tablet (81 mg total) by mouth daily. 30 tablet 3   carvedilol (COREG) 6.25 MG tablet Take 1 tablet (6.25 mg total) by mouth 2 (two) times daily with a meal. 60 tablet 6   dapagliflozin propanediol (FARXIGA) 10 MG TABS  tablet Take 1 tablet (10 mg total) by mouth daily. 30 tablet 3   furosemide (LASIX) 40 MG tablet Take 1 tablet (40 mg total) by mouth daily. 30 tablet 3   omeprazole (PRILOSEC OTC) 20 MG tablet Take 20 mg by mouth daily. Check with Dr about getting prescription strength omperazole     sacubitril-valsartan (ENTRESTO) 49-51 MG Take 1 tablet by mouth 2 (two) times daily. 60 tablet 3   spironolactone (ALDACTONE) 25 MG tablet Take 0.5 tablets (12.5 mg total) by mouth at bedtime. 15 tablet 6   No current facility-administered medications for this encounter.    Allergies  Allergen Reactions   Penicillins Rash    Has patient had a PCN reaction causing immediate rash, facial/tongue/throat swelling, SOB or lightheadedness with hypotension: unknown Has patient had a PCN reaction causing severe rash involving mucus membranes or skin necrosis: unknown Has patient had a PCN reaction that required hospitalization : yes Has patient had a PCN reaction occurring within the last 10 years: no If all of the above answers are "NO", then may proceed with Cephalosporin use.    Penicillin G Other (See Comments)    Per patient, childhood allergy, reaction unknown.        Social History   Socioeconomic History   Marital status: Legally Separated    Spouse name: Not on file   Number of children: Not on file   Years of education: Not on file   Highest education level: Not on file  Occupational History   Not on file  Tobacco Use   Smoking status: Never   Smokeless tobacco: Never  Vaping Use   Vaping status: Never Used  Substance and Sexual Activity   Alcohol use: No   Drug use: Never   Sexual activity: Yes    Birth control/protection: None  Other Topics Concern   Not on file  Social History Narrative   Not on file   Social Determinants of Health   Financial Resource Strain: Low Risk  (11/02/2022)   Overall Financial Resource Strain (CARDIA)    Difficulty of Paying Living Expenses: Not hard at  all  Food Insecurity: No Food Insecurity (11/02/2022)   Hunger Vital Sign    Worried About Running Out of Food in the Last Year: Never true    Ran Out of Food in the Last Year: Never true  Transportation Needs: No Transportation Needs (11/02/2022)   PRAPARE - Administrator, Civil Service (Medical): No    Lack of Transportation (Non-Medical): No  Physical Activity: Not on file  Stress: Not on file  Social Connections: Unknown (12/13/2021)   [facility]   Social Network    Social Network: Not on file  Intimate Partner Violence: Unknown (11/10/2021)   Received from [facility]   HITS    Physically Hurt: Not on file    Insult or Talk Down To: Not on file    Threaten Physical Harm: Not on file    Scream or Curse: Not on file      Family History  Problem Relation Age of Onset   Hypertension Other    Cancer Other    Alcohol abuse Mother    Heart disease Mother    Alcohol abuse Father    Heart disease Father    Arthritis Maternal Grandmother    Arthritis Maternal Grandfather    Arthritis Paternal Grandmother    Arthritis Paternal Grandfather     PHYSICAL EXAM: Vitals:   02/22/23 1458  BP: 102/70  Pulse: (!) 58  SpO2: 99%   GENERAL: Well nourished, well developed, and in no apparent distress at rest.  HEENT: Negative for arcus senilis or xanthelasma. There is no scleral icterus.  The mucous membranes are pink and moist.   NECK: Supple, No masses. Normal carotid upstrokes without bruits. No masses or thyromegaly.    CHEST: There are no chest wall deformities. There is no chest wall tenderness. Respirations are unlabored.  Lungs- CTA B/L CARDIAC:  JVP: 7 cm          Normal rate with regular rhythm. No murmurs, rubs or gallops.  Pulses are 2+ and symmetrical in upper and lower extremities. No edema.  ABDOMEN: Soft, non-tender, non-distended. There are no masses or hepatomegaly. There are normal bowel sounds.  EXTREMITIES: Warm and well perfused with  no cyanosis, clubbing.  LYMPHATIC: No axillary or supraclavicular lymphadenopathy.  NEUROLOGIC: Patient is oriented x3 with no focal or lateralizing neurologic deficits.  PSYCH: Patients affect is appropriate, there is no evidence of anxiety or depression.  SKIN: Warm and dry; no lesions or wounds.     DATA REVIEW  ECG: 18/24: Normal sinus rhythm as per my personal interpretation  ECHO: 10/12/22: LVEF 25%, grade 2 diastolic dysfunction, normal RV function as per my personal interpretation 02/22/23: LVEF 25%-30%, normal RV function.   CATH: 1.Normal coronaries 2. Elevated filling pressures.  3. Mild pulmonary venous hypertension.  4. Preserved cardiac output.  1 point  CMR: 11/07/22: 1.  Moderately dilated LV with global hypokinesis, EF 22%.  2.  Normal RV size with EF 50%.  3. No myocardial LGE, so no definitive evidence for prior MI, myocarditis, or infiltrative disease.  ASSESSMENT & PLAN:  Heart failure with reduced ejection fraction Etiology of HF: Nonischemic cardiomyopathy with cardiac MRI not demonstrating any infiltrative cardiomyopathy or LGE.  I spoke with her family while inpatient and they commented on significant family history of heart failure and multiple family members before the age of 65-60.  They may possibly have 1 family member with sudden cardiac arrest.  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  Following with Dr. Doristine Locks on 03/19/23.  NYHA class / AHA Stage: NYHA II Volume status & Diuretics: Euvolemic continue Lasix 40 mg daily Vasodilators: Entresto 49/51 mg twice daily Beta-Blocker: continue Coreg to 6.25 mg twice daily MRA: Continue spironolactone 12.5mg  daily Cardiometabolic: Farxiga 10 mg Devices therapies & Valvulopathies: Secondary to an ICD in place Advanced therapies: Continue GDMT; currently NYHA II. Persistently reduced LV function. Will continue cardiac rehab for now. Plan on CPX in the near future.Repeat labs  today  2.  V-fib arrest -No etiology determined at this point.  EP following.  Hypokalemic on arrival during her arrest.  Will obtain genetic screening today. -JUP gene positive; associated with ARVC  3.  Obesity -BMI 38 -Discussed importance of weight loss. -Will refer to healthy weight loss clinic at follow-up.  4. Chest pain  - Reports having pain at ICD site; feels that it is swelling - Will obtain CXR -On my exam no pain with palpation or erythema/swelling at ICD site.  Lengthy discussion today regarding potential need for advanced therapies in the future. She has persistently reduced LVEF; genetics postive for JUP mutation. Will plan on CPX soon. Functionally doing very well.    Mason Burleigh [facility] Mechanical Circulatory Support

## 2023-02-25 ENCOUNTER — Encounter (HOSPITAL_COMMUNITY): Payer: BC Managed Care – PPO

## 2023-02-26 NOTE — Progress Notes (Signed)
Remote ICD transmission.   

## 2023-02-27 ENCOUNTER — Encounter (HOSPITAL_COMMUNITY): Payer: BC Managed Care – PPO

## 2023-03-01 ENCOUNTER — Telehealth (HOSPITAL_COMMUNITY): Payer: Self-pay | Admitting: Cardiology

## 2023-03-01 ENCOUNTER — Encounter (HOSPITAL_COMMUNITY): Payer: BC Managed Care – PPO

## 2023-03-01 NOTE — Telephone Encounter (Signed)
Pt aware-  Reports she has had symptoms for a long time Will not report to PCP only wants meds Advised medications needed for yeast infection and or uti would need to come from PCP.  Reports again she does not symptoms to be managed only medications  Advised again-should contact PCP  Reports she hasn't seen in over a year and does not want a new bill

## 2023-03-01 NOTE — Telephone Encounter (Signed)
Patient called to report yeast infection and uti symptoms. Was advised to call should symptoms arise. -frequent urination  -vaginal itching    Also waiting on ozempic referral/medication -will route to pharmacy team for further review

## 2023-03-01 NOTE — Telephone Encounter (Signed)
Please call and stop [place].   May need to UA at PCP.   Monnica Saltsman Np-C  2:56 PM

## 2023-03-01 NOTE — Telephone Encounter (Signed)
Spoke with pt, she is aware I'll let her know once I hear back on coverage determination. Will schedule appt with PharmD if her insurance covers weight loss GLP.

## 2023-03-01 NOTE — Telephone Encounter (Addendum)
Called pt and left message to schedule PharmD appt to discuss GLP. Doesn't have DM or CAD so main option for coverage would be a weight loss GLP. Will submit prior auth for Zepbound to check on coverage in the mean time. Key [id number]. Waiting on clinical questions to populate.

## 2023-03-04 ENCOUNTER — Encounter (HOSPITAL_COMMUNITY): Payer: BC Managed Care – PPO

## 2023-03-04 NOTE — Telephone Encounter (Signed)
Received denial from insurance - "the requested service is not a covered benefit."  Called pt to advise her that her plan does not cover weight loss medication unfortunately. She is frustrated by this as she pays $300/month for her insurance plan. Advised her she can let us know if she changes insurance plans in the future and could rerun for possible coverage.

## 2023-03-05 ENCOUNTER — Telehealth (HOSPITAL_COMMUNITY): Payer: Self-pay | Admitting: *Deleted

## 2023-03-05 NOTE — Telephone Encounter (Signed)
Left message to call cardiac rehab.Maria Walden Whitaker RN BSN  

## 2023-03-06 ENCOUNTER — Encounter (HOSPITAL_COMMUNITY): Payer: BC Managed Care – PPO

## 2023-03-06 ENCOUNTER — Telehealth (HOSPITAL_COMMUNITY): Payer: Self-pay | Admitting: *Deleted

## 2023-03-06 NOTE — Progress Notes (Signed)
Cardiac Individual Treatment Plan  Patient Details  Name: JEWELINE BREITLING MRN: [id number] Date of Birth: 25-Mar-1971 Referring Provider:   Flowsheet Row INTENSIVE CARDIAC REHAB ORIENT from 01/24/2023 in [facility]  Referring Provider Dorthula Nettles, DO       Initial Encounter Date:  Flowsheet Row INTENSIVE CARDIAC REHAB ORIENT from 01/24/2023 in [facility]  Date 01/24/23       Visit Diagnosis: 11/01/22 V fib Arrest  NICM  Patient's Home Medications on Admission:  Current Outpatient Medications:    aspirin 81 MG chewable tablet, Chew 1 tablet (81 mg total) by mouth daily., Disp: 30 tablet, Rfl: 3   carvedilol (COREG) 6.25 MG tablet, Take 1 tablet (6.25 mg total) by mouth 2 (two) times daily with a meal., Disp: 60 tablet, Rfl: 6   dapagliflozin propanediol (FARXIGA) 10 MG TABS tablet, Take 1 tablet (10 mg total) by mouth daily., Disp: 30 tablet, Rfl: 3   furosemide (LASIX) 40 MG tablet, Take 1 tablet (40 mg total) by mouth daily., Disp: 30 tablet, Rfl: 3   omeprazole (PRILOSEC OTC) 20 MG tablet, Take 20 mg by mouth daily. Check with Dr about getting prescription strength omperazole, Disp: , Rfl:    sacubitril-valsartan (ENTRESTO) 49-51 MG, Take 1 tablet by mouth 2 (two) times daily., Disp: 60 tablet, Rfl: 3   spironolactone (ALDACTONE) 25 MG tablet, Take 1 tablet (25 mg total) by mouth at bedtime. Please cancel all previous orders for current medication. Change in dosage or pill size., Disp: 30 tablet, Rfl: 6  Past Medical History: Past Medical History:  Diagnosis Date   Acid reflux    Anemia    Fibroids    Heart murmur    Knee pain    UTI (lower urinary tract infection)     Tobacco Use: Social History   Tobacco Use  Smoking Status Never  Smokeless Tobacco Never    Labs: Review Flowsheet  More data exists      Latest Ref Rng & Units 11/02/2022  11/03/2022 11/04/2022 11/05/2022 11/06/2022  Labs for ITP Cardiac and Pulmonary Rehab  Hemoglobin A1c 4.8 - 5.6 % 5.1  - - - -  PH, Arterial 7.35 - 7.45 7.392  - - - -  PCO2 arterial 32 - 48 mmHg 36.8  - - - -  Bicarbonate 20.0 - 28.0 mmol/L 20.0 - 28.0 mmol/L 22.4  - - - 26.8  27.8   TCO2 22 - 32 mmol/L 22 - 32 mmol/L 23  - - - 28  29   Acid-base deficit 0.0 - 2.0 mmol/L 2.0  - - - -  O2 Saturation % % 76.3  100  74.7  53.5  80.6  74.2  66  66     Details       Multiple values from one day are sorted in reverse-chronological order         Capillary Blood Glucose: Lab Results  Component Value Date   GLUCAP 96 11/08/2022   GLUCAP 78 11/08/2022   GLUCAP 97 11/08/2022   GLUCAP 77 11/08/2022   GLUCAP 87 11/08/2022     Exercise Target Goals: Exercise Program Goal: Individual exercise prescription set using results from initial 6 min walk test and THRR while considering  patient's activity barriers and safety.   Exercise Prescription Goal: Initial exercise prescription builds to 30-45 minutes a day of aerobic activity, 2-3 days per week.  Home exercise guidelines will be given to patient during program as part of exercise prescription that the participant will acknowledge.  Activity Barriers & Risk Stratification:  Activity Barriers & Cardiac Risk Stratification - 01/24/23 1526       Activity Barriers & Cardiac Risk Stratification   Activity Barriers Deconditioning;Decreased Ventricular Function;Balance Concerns   single leg balance test 5.82 s   Cardiac Risk Stratification High   <5 METs on            6 Minute Walk:  6 Minute Walk     Row Name 01/24/23 1525         6 Minute Walk   Phase Initial     Distance 1008 feet     Walk Time 6 minutes     # of Rest Breaks 0     MPH 1.91     METS 2.56     RPE 7     Perceived Dyspnea  0     VO2 Peak 8.94     Symptoms No     Resting HR 63 bpm     Resting BP 103/70     Resting Oxygen Saturation  100 %      Exercise Oxygen Saturation  during 6 min walk 100 %     Max Ex. HR 89 bpm     Max Ex. BP 97/56     2 Minute Post BP 94/64              Oxygen Initial Assessment:   Oxygen Re-Evaluation:   Oxygen Discharge (Final Oxygen Re-Evaluation):   Initial Exercise Prescription:  Initial Exercise Prescription - 01/24/23 1500       Date of Initial Exercise RX and Referring Provider   Date 01/24/23    Referring Provider Dorthula Nettles, DO    Expected Discharge Date 04/05/23      Recumbant Bike   Level 1    RPM 50    Watts 18    Minutes 15    METs 2      NuStep   Level 1    SPM 65    Minutes 15    METs 2      Prescription Details   Frequency (times per week) 3    Duration Progress to 30 minutes of continuous aerobic without signs/symptoms of physical distress      Intensity   THRR 40-80% of Max Heartrate 68-135    Ratings of Perceived Exertion 11-13    Perceived Dyspnea 0-4      Progression   Progression Continue progressive overload as per policy without signs/symptoms or physical distress.      Resistance Training   Training Prescription Yes    Weight 2    Reps 10-15             Perform Capillary Blood Glucose checks as needed.  Exercise Prescription Changes:   Exercise Prescription Changes     Row Name 01/28/23 1625 02/11/23 1623           Response to Exercise   Blood Pressure (Admit) 120/58 104/58      Blood Pressure (Exercise) 102/70 110/66      Blood Pressure (Exit) 92/56 94/93      Heart Rate (Admit) 75 bpm 71 bpm      Heart Rate (Exercise) 93 bpm 102 bpm      Heart Rate (Exit) 80 bpm 63 bpm      Rating of Perceived Exertion (Exercise) 11.5 11.5  Perceived Dyspnea (Exercise) 0 0      Symptoms 0 0      Comments Pt first day in teh [facility] ICR program REVD MET's and goals      Duration Progress to 30 minutes of  aerobic without signs/symptoms of physical distress Progress to 30 minutes of  aerobic without signs/symptoms of physical  distress      Intensity THRR unchanged THRR unchanged        Progression   Progression Continue to progress workloads to maintain intensity without signs/symptoms of physical distress. Continue to progress workloads to maintain intensity without signs/symptoms of physical distress.      Average METs 1.85 2.45        Resistance Training   Training Prescription Yes Yes      Weight 2 2      Reps 10-15 10-15      Time 10 Minutes 10 Minutes        Recumbant Bike   Level 1 2      RPM -- 64      Watts -- 30      Minutes 15 15      METs 2 2.2        NuStep   Level 1 2      SPM 75 112      Minutes 15 15      METs 1.7 2.7               Exercise Comments:   Exercise Comments     Row Name 01/28/23 1631 02/11/23 1630         Exercise Comments Pt first day in the [facility] I[facility] program. Pt tolerated exercise well with an average MET level of 1.85. Pt is learning her THRR, RPE and ExRx. She's off to a good start. Will continue to monitor pt and progress workloads as tolerated without sign or symptom REVD MET's and goals. Evaluated home ExRx, but patient states exercise here in enough right now and she is still challenging herself with her stairs at home. Talked about reevaluating home exrx to achieve 5-7 of exercise to meet [facility] standards further along as she gains strength. Pt tolerated exercise well with an average MET level of 2.45. Pt feels good about her goals so far and is making progress               Exercise Goals and Review:   Exercise Goals     Row Name 01/24/23 1527             Exercise Goals   Increase Physical Activity Yes       Intervention Provide advice, education, support and counseling about physical activity/exercise needs.;Develop an individualized exercise prescription for aerobic and resistive training based on initial evaluation findings, risk stratification, comorbidities and participant's personal goals.       Expected Outcomes Short Term: Attend  rehab on a regular basis to increase amount of physical activity.;Long Term: Add in home exercise to make exercise part of routine and to increase amount of physical activity.;Long Term: Exercising regularly at least 3-5 days a week.       Increase Strength and Stamina Yes       Intervention Develop an individualized exercise prescription for aerobic and resistive training based on initial evaluation findings, risk stratification, comorbidities and participant's personal goals.;Provide advice, education, support and counseling about physical activity/exercise needs.       Expected Outcomes Short Term: Increase workloads from initial exercise prescription  for resistance, speed, and METs.;Short Term: Perform resistance training exercises routinely during rehab and add in resistance training at home;Long Term: Improve cardiorespiratory fitness, muscular endurance and strength as measured by increased METs and functional capacity ( )       Able to understand and use rate of perceived exertion (RPE) scale Yes       Intervention Provide education and explanation on how to use RPE scale       Expected Outcomes Short Term: Able to use RPE daily in rehab to express subjective intensity level;Long Term:  Able to use RPE to guide intensity level when exercising independently       Knowledge and understanding of Target Heart Rate Range (THRR) Yes       Intervention Provide education and explanation of THRR including how the numbers were predicted and where they are located for reference       Expected Outcomes Short Term: Able to state/look up THRR;Short Term: Able to use daily as guideline for intensity in rehab;Long Term: Able to use THRR to govern intensity when exercising independently       Understanding of Exercise Prescription Yes       Intervention Provide education, explanation, and written materials on patient's individual exercise prescription       Expected Outcomes Short Term: Able to explain program  exercise prescription;Long Term: Able to explain home exercise prescription to exercise independently                Exercise Goals Re-Evaluation :  Exercise Goals Re-Evaluation     Row Name 01/28/23 1628 02/11/23 1625           Exercise Goal Re-Evaluation   Exercise Goals Review Increase Physical Activity;Understanding of Exercise Prescription;Increase Strength and Stamina;Knowledge and understanding of Target Heart Rate Range (THRR);Able to understand and use rate of perceived exertion (RPE) scale Increase Physical Activity;Understanding of Exercise Prescription;Increase Strength and Stamina;Knowledge and understanding of Target Heart Rate Range (THRR);Able to understand and use rate of perceived exertion (RPE) scale      Comments Pt first day in the [facility] I[facility] program. Pt tolerated exercise well with an average MET level of 1.85. Pt is learning her THRR, RPE and ExRx. She's off to a good start REVD MET's and goals. Evaluated home ExRx, but patient states exercise here in enough right now and she is still challenging herself with her stairs at home. Talked about reevaluating home exrx to achieve 5-7 of exercise to meet [facility] standards further along as she gains strength. Pt tolerated exercise well with an average MET level of 2.45. Pt feels good about her goals so far and is making progress      Expected Outcomes Will continue to monitor pt and progress workloads as tolerated without sign or symptom Will continue to monitor pt and progress workloads as tolerated without sign or symptom               Discharge Exercise Prescription (Final Exercise Prescription Changes):  Exercise Prescription Changes - 02/11/23 1623       Response to Exercise   Blood Pressure (Admit) 104/58    Blood Pressure (Exercise) 110/66    Blood Pressure (Exit) 94/93    Heart Rate (Admit) 71 bpm    Heart Rate (Exercise) 102 bpm    Heart Rate (Exit) 63 bpm    Rating of Perceived Exertion (Exercise) 11.5     Perceived Dyspnea (Exercise) 0    Symptoms 0    Comments REVD MET's  and goals    Duration Progress to 30 minutes of  aerobic without signs/symptoms of physical distress    Intensity THRR unchanged      Progression   Progression Continue to progress workloads to maintain intensity without signs/symptoms of physical distress.    Average METs 2.45      Resistance Training   Training Prescription Yes    Weight 2    Reps 10-15    Time 10 Minutes      Recumbant Bike   Level 2    RPM 64    Watts 30    Minutes 15    METs 2.2      NuStep   Level 2    SPM 112    Minutes 15    METs 2.7             Nutrition:  Target Goals: Understanding of nutrition guidelines, daily intake of sodium 1500mg , cholesterol 200mg , calories 30% from fat and 7% or less from saturated fats, daily to have 5 or more servings of fruits and vegetables.  Biometrics:  Pre Biometrics - 01/24/23 1523       Pre Biometrics   Waist Circumference 50 inches    Hip Circumference 48.5 inches    Waist to Hip Ratio 1.03 %    Triceps Skinfold 40 mm    % Body Fat 49.6 %    Grip Strength 24 kg    Flexibility 17 in    Single Leg Stand 5.82 seconds              Nutrition Therapy Plan and Nutrition Goals:  Nutrition Therapy & Goals - 02/27/23 1342       Nutrition Therapy   Diet Heart Healthy/Bariatric Diet      Personal Nutrition Goals   Nutrition Goal Patient to identify strategies for reducing cardiovascular risk by attending the [facility] education and nutrition series weekly.   goal in progress.   Personal Goal #2 Patient to improve diet quality by using the plate method as a guide for meal planning to include lean protein/plant protein, fruits, vegetables, whole grains, nonfat dairy as part of a well-balanced diet.   goal in progress.   Personal Goal #3 Patient to limit sodium to 1500mg  per day   goal in progress.   Personal Goal #4 Patient to begin bariatric supplements daily.   goal in  progress.   Comments Goals in progress. Prianna continues to attend the [employer] and nutrition series regularly. Michaelle is s/p duodenal switch single anastamosis on 06/06/2022 at 316#; she is down 94# in ~56months (28% body weight). She is down 3.5# since starting with our program. We have discussed the importance of bariatric supplementation and gave resources.  She continues one protein shake daily (Premier, 30g protein). Patient will benefit from participation in intensive cardiac rehab for nutrition, exercise, and lifestyle modification.      Intervention Plan   Intervention Prescribe, educate and counsel regarding individualized specific dietary modifications aiming towards targeted core components such as weight, hypertension, lipid management, diabetes, heart failure and other comorbidities.;Nutrition handout(s) given to patient.    Expected Outcomes Short Term Goal: Understand basic principles of dietary content, such as calories, fat, sodium, cholesterol and nutrients.;Long Term Goal: Adherence to prescribed nutrition plan.;Short Term Goal: A plan has been developed with personal nutrition goals set during dietitian appointment.             Nutrition Assessments:  MEDIFICTS Score Key: ?70 Need to make dietary  changes  40-70 Heart Healthy Diet ? 40 Therapeutic Level Cholesterol Diet    Picture Your Plate Scores: <59 Unhealthy dietary pattern with much room for improvement. 41-50 Dietary pattern unlikely to meet recommendations for good health and room for improvement. 51-60 More healthful dietary pattern, with some room for improvement.  >60 Healthy dietary pattern, although there may be some specific behaviors that could be improved.    Nutrition Goals Re-Evaluation:  Nutrition Goals Re-Evaluation     Row Name 01/28/23 1553 02/27/23 1342           Goals   Current Weight 226 lb 3.1 oz (102.6 kg) 222 lb 10.6 oz (101 kg)      Comment GFR 56, Cr 1.17, BNP 156 BNP  191, potassium 3.3; most recent bariatric labs are WNL (from 5/24).      Expected Outcome Darbie is s/p duodenal switch single anastamosis on 06/06/2022 at 316#; she is down 90# in ~15months (28% body weight). She is not currently taking any bariatric supplements as she reports not tolerating the chewable options recommended at [facility] weight management; stressed the importance of appropriate supplementation and will go over additional supplement options. She continues one protein shake daily (Premier, 30g protein). Patient will benefit from participation in intensive cardiac rehab for nutrition, exercise, and lifestyle modification. Goals in progress. Abigael continues to attend the [employer] and nutrition series regularly. Amarrie is s/p duodenal switch single anastamosis on 06/06/2022 at 316#; she is down 94# in ~28months (28% body weight). She is down 3.5# since starting with our program. We have discussed the importance of bariatric supplementation and gave resources. She continues one protein shake daily (Premier, 30g protein). Patient will benefit from participation in intensive cardiac rehab for nutrition, exercise, and lifestyle modification.               Nutrition Goals Re-Evaluation:  Nutrition Goals Re-Evaluation     Row Name 01/28/23 1553 02/27/23 1342           Goals   Current Weight 226 lb 3.1 oz (102.6 kg) 222 lb 10.6 oz (101 kg)      Comment GFR 56, Cr 1.17, BNP 156 BNP 191, potassium 3.3; most recent bariatric labs are WNL (from 5/24).      Expected Outcome Karolee is s/p duodenal switch single anastamosis on 06/06/2022 at 316#; she is down 90# in ~37months (28% body weight). She is not currently taking any bariatric supplements as she reports not tolerating the chewable options recommended at [facility] weight management; stressed the importance of appropriate supplementation and will go over additional supplement options. She continues one protein shake daily (Premier,  30g protein). Patient will benefit from participation in intensive cardiac rehab for nutrition, exercise, and lifestyle modification. Goals in progress. Dashanae continues to attend the [employer] and nutrition series regularly. Shaneqwa is s/p duodenal switch single anastamosis on 06/06/2022 at 316#; she is down 94# in ~35months (28% body weight). She is down 3.5# since starting with our program. We have discussed the importance of bariatric supplementation and gave resources. She continues one protein shake daily (Premier, 30g protein). Patient will benefit from participation in intensive cardiac rehab for nutrition, exercise, and lifestyle modification.               Nutrition Goals Discharge (Final Nutrition Goals Re-Evaluation):  Nutrition Goals Re-Evaluation - 02/27/23 1342       Goals   Current Weight 222 lb 10.6 oz (101 kg)    Comment BNP 191,  potassium 3.3; most recent bariatric labs are WNL (from 5/24).    Expected Outcome Goals in progress. Latricia continues to attend the [employer] and nutrition series regularly. Evann is s/p duodenal switch single anastamosis on 06/06/2022 at 316#; she is down 94# in ~70months (28% body weight). She is down 3.5# since starting with our program. We have discussed the importance of bariatric supplementation and gave resources. She continues one protein shake daily (Premier, 30g protein). Patient will benefit from participation in intensive cardiac rehab for nutrition, exercise, and lifestyle modification.             Psychosocial: Target Goals: Acknowledge presence or absence of significant depression and/or stress, maximize coping skills, provide positive support system. Participant is able to verbalize types and ability to use techniques and skills needed for reducing stress and depression.  Initial Review & Psychosocial Screening:  Initial Psych Review & Screening - 01/24/23 1451       Initial Review   Current issues with  History of Depression;Current Stress Concerns    Source of Stress Concerns Family;Chronic Illness    Comments Elisabeth Cara denies being depressed currently depressed. Lashonna's signifigant other is currently in [place], he will be relaeased in August and is going to stay with his mother. Vonnetta says she is doing better than she was a few months ago.      Family Dynamics   Good Support System? Yes   Kymoni has her sisters for support. Verna Czech has her children for support also wholive in the area.     Barriers   Psychosocial barriers to participate in program The patient should benefit from training in stress management and relaxation.      Screening Interventions   Interventions Encouraged to exercise;Provide feedback about the scores to participant    Expected Outcomes Long Term Goal: Stressors or current issues are controlled or eliminated.;Short Term goal: Identification and review with participant of any Quality of Life or Depression concerns found by scoring the questionnaire.             Quality of Life Scores:  Quality of Life - 01/24/23 1528       Quality of Life   Select Quality of Life      Quality of Life Scores   Health/Function Pre 22.13 %    Socioeconomic Pre 17.5 %    Psych/Spiritual Pre 15.5 %    Family Pre 27.3 %    GLOBAL Pre 20.67 %            Scores of 19 and below usually indicate a poorer quality of life in these areas.  A difference of  2-3 points is a clinically meaningful difference.  A difference of 2-3 points in the total score of the Quality of Life Index has been associated with significant improvement in overall quality of life, self-image, physical symptoms, and general health in studies assessing change in quality of life.  PHQ-9: Review Flowsheet       01/24/2023 02/15/2011  Depression screen PHQ 2/9  Decreased Interest 1 3  Down, Depressed, Hopeless 1 3  PHQ - 2 Score 2 6  Altered sleeping 0 3  Tired, decreased energy 1 3  Change in  appetite 2 3  Feeling bad or failure about yourself  1 3  Trouble concentrating 1 0  Moving slowly or fidgety/restless 0 3  Suicidal thoughts 0 --  PHQ-9 Score 7 21  Difficult doing work/chores Not difficult at all -    Details  Interpretation of Total Score  Total Score Depression Severity:  1-4 = Minimal depression, 5-9 = Mild depression, 10-14 = Moderate depression, 15-19 = Moderately severe depression, 20-27 = Severe depression   Psychosocial Evaluation and Intervention:   Psychosocial Re-Evaluation:  Psychosocial Re-Evaluation     Row Name 01/29/23 5621 03/06/23 1303           Psychosocial Re-Evaluation   Current issues with Current Depression;Current Stress Concerns;History of Depression Current Depression;Current Stress Concerns;History of Depression      Comments Reviewed quality of life questionniare and PHQ2-9. Virgilia admits to being depressed at times. Denies the need for counseling. Krisitie continues to have good support from her family last day of exercise was on 02/20/23 unable to reassess.      Expected Outcomes Verna Czech will have controlled or decreased depression upon completion of intensive cardiac rehab Krisite will have controlled or decreased depression upon completion of intensive cardiac rehab      Interventions Relaxation education;Encouraged to attend Cardiac Rehabilitation for the exercise;Stress management education Relaxation education;Encouraged to attend Cardiac Rehabilitation for the exercise;Stress management education      Continue Psychosocial Services  Follow up required by staff Follow up required by staff        Initial Review   Source of Stress Concerns Chronic Illness;Financial Chronic Illness;Financial      Comments Will continue to monitor and noffer support as needed Will continue to monitor and noffer support as needed               Psychosocial Discharge (Final Psychosocial Re-Evaluation):  Psychosocial Re-Evaluation  - 03/06/23 1303       Psychosocial Re-Evaluation   Current issues with Current Depression;Current Stress Concerns;History of Depression    Comments last day of exercise was on 02/20/23 unable to reassess.    Expected Outcomes Verna Czech will have controlled or decreased depression upon completion of intensive cardiac rehab    Interventions Relaxation education;Encouraged to attend Cardiac Rehabilitation for the exercise;Stress management education    Continue Psychosocial Services  Follow up required by staff      Initial Review   Source of Stress Concerns Chronic Illness;Financial    Comments Will continue to monitor and noffer support as needed             Vocational Rehabilitation: Provide vocational rehab assistance to qualifying candidates.   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 Participant will state understanding/return demonstration of topics presented.    Education     Row Name 01/28/23 1500     Education   Cardiac Education Topics Pritikin   Select Workshops     Workshops   Educator Exercise Physiologist   Select Exercise   Exercise Workshop Exercise Basics: Building Your Action Plan   Instruction Review Code 1- Verbalizes Understanding   Class Start Time 1404   Class Stop Time 1448   Class Time Calculation (min) 44 min    Row Name 01/30/23 1146     Education   Cardiac Education Topics Pritikin   Customer service manager  Weekly Topic Tasty Appetizers and Snacks   Instruction Review Code 1- Verbalizes Understanding   Class Start Time 1400   Class Stop Time  1440   Class Time Calculation (min) 40 min    Row Name 02/04/23 1500     Education   Cardiac Education Topics Pritikin   Select Core Videos     Core Videos   Educator Dietitian   Select Nutrition   Nutrition Calorie Density   Instruction Review Code 1- Verbalizes Understanding   Class Start Time 1400   Class Stop Time 1445   Class Time Calculation (min) 45 min    Row Name 02/11/23 1400     Education   Cardiac Education Topics Pritikin   Select Workshops     Workshops   Educator Exercise Physiologist   Select Psychosocial   Psychosocial Workshop Focused Goals, Sustainable Changes   Instruction Review Code 1- Verbalizes Understanding   Class Start Time 1355   Class Stop Time 1437   Class Time Calculation (min) 42 min    Row Name 02/18/23 1600     Education   Cardiac Education Topics Pritikin   Psychologist, forensic Exercise Education   Exercise Education Biomechanial Limitations   Instruction Review Code 1- Verbalizes Understanding   Class Start Time 1406   Class Stop Time 1449   Class Time Calculation (min) 43 min    Row Name 02/20/23 1600     Education   Cardiac Education Topics Pritikin   Customer service manager   Weekly Topic Comforting Weekend Breakfasts   Instruction Review Code 1- Verbalizes Understanding   Class Start Time 1400   Class Stop Time 1445   Class Time Calculation (min) 45 min            Core Videos: Exercise    Move It!  Clinical staff conducted group or individual video education with verbal and written material and guidebook.  Patient learns the recommended Pritikin exercise program. Exercise with the goal of living a long, healthy life. Some of the health benefits of exercise include controlled diabetes, healthier blood pressure levels, improved cholesterol levels, improved heart and lung capacity, improved sleep, and better body  composition. Everyone should speak with their doctor before starting or changing an exercise routine.  Biomechanical Limitations Clinical staff conducted group or individual video education with verbal and written material and guidebook.  Patient learns how biomechanical limitations can impact exercise and how we can mitigate and possibly overcome limitations to have an impactful and balanced exercise routine.  Body Composition Clinical staff conducted group or individual video education with verbal and written material and guidebook.  Patient learns that body composition (ratio of muscle mass to fat mass) is a key component to assessing overall fitness, rather than body weight alone. Increased fat mass, especially visceral belly fat, can put [place] at increased risk for metabolic syndrome, type 2 diabetes, heart disease, and even death. It is recommended to combine diet and exercise (cardiovascular and resistance training) to improve your body composition. Seek guidance from your physician and exercise physiologist before implementing an exercise routine.  Exercise Action Plan Clinical staff conducted group or individual video education with verbal and written material and guidebook.  Patient learns the recommended strategies to achieve and enjoy long-term exercise adherence, including variety, self-motivation, self-efficacy, and positive decision making. Benefits of exercise include fitness,  good health, weight management, more energy, better sleep, less stress, and overall well-being.  Medical   Heart Disease Risk Reduction Clinical staff conducted group or individual video education with verbal and written material and guidebook.  Patient learns our heart is our most vital organ as it circulates oxygen, nutrients, white blood cells, and hormones throughout the entire body, and carries waste away. Data supports a plant-based eating plan like the Pritikin Program for its effectiveness in slowing  progression of and reversing heart disease. The video provides a number of recommendations to address heart disease.   Metabolic Syndrome and Belly Fat  Clinical staff conducted group or individual video education with verbal and written material and guidebook.  Patient learns what metabolic syndrome is, how it leads to heart disease, and how one can reverse it and keep it from coming back. You have metabolic syndrome if you have 3 of the following 5 criteria: abdominal obesity, high blood pressure, high triglycerides, low HDL cholesterol, and high blood sugar.  Hypertension and Heart Disease Clinical staff conducted group or individual video education with verbal and written material and guidebook.  Patient learns that high blood pressure, or hypertension, is very common in the [place]. Hypertension is largely due to excessive salt intake, but other important risk factors include being overweight, physical inactivity, drinking too much alcohol, smoking, and not eating enough potassium from fruits and vegetables. High blood pressure is a leading risk factor for heart attack, stroke, congestive heart failure, dementia, kidney failure, and premature death. Long-term effects of excessive salt intake include stiffening of the arteries and thickening of heart muscle and organ damage. Recommendations include ways to reduce hypertension and the risk of heart disease.  Diseases of Our Time - Focusing on Diabetes Clinical staff conducted group or individual video education with verbal and written material and guidebook.  Patient learns why the best way to stop diseases of our time is prevention, through food and other lifestyle changes. Medicine (such as prescription pills and surgeries) is often only a Band-Aid on the problem, not a long-term solution. Most common diseases of our time include obesity, type 2 diabetes, hypertension, heart disease, and cancer. The Pritikin Program is recommended and has been  proven to help reduce, reverse, and/or prevent the damaging effects of metabolic syndrome.  Nutrition   Overview of the Pritikin Eating Plan  Clinical staff conducted group or individual video education with verbal and written material and guidebook.  Patient learns about the Pritikin Eating Plan for disease risk reduction. The Pritikin Eating Plan emphasizes a wide variety of unrefined, minimally-processed carbohydrates, like fruits, vegetables, whole grains, and legumes. Go, Caution, and Stop food choices are explained. Plant-based and lean animal proteins are emphasized. Rationale provided for low sodium intake for blood pressure control, low added sugars for blood sugar stabilization, and low added fats and oils for coronary artery disease risk reduction and weight management.  Calorie Density  Clinical staff conducted group or individual video education with verbal and written material and guidebook.  Patient learns about calorie density and how it impacts the Pritikin Eating Plan. Knowing the characteristics of the food you choose will help you decide whether those foods will lead to weight gain or weight loss, and whether you want to consume more or less of them. Weight loss is usually a side effect of the Pritikin Eating Plan because of its focus on low calorie-dense foods.  Label Reading  Clinical staff conducted group or individual video education with verbal and  written material and guidebook.  Patient learns about the Pritikin recommended label reading guidelines and corresponding recommendations regarding calorie density, added sugars, sodium content, and whole grains.  Dining Out - Part 1  Clinical staff conducted group or individual video education with verbal and written material and guidebook.  Patient learns that restaurant meals can be sabotaging because they can be so high in calories, fat, sodium, and/or sugar. Patient learns recommended strategies on how to positively address  this and avoid unhealthy pitfalls.  Facts on Fats  Clinical staff conducted group or individual video education with verbal and written material and guidebook.  Patient learns that lifestyle modifications can be just as effective, if not more so, as many medications for lowering your risk of heart disease. A Pritikin lifestyle can help to reduce your risk of inflammation and atherosclerosis (cholesterol build-up, or plaque, in the artery walls). Lifestyle interventions such as dietary choices and physical activity address the cause of atherosclerosis. A review of the types of fats and their impact on blood cholesterol levels, along with dietary recommendations to reduce fat intake is also included.  Nutrition Action Plan  Clinical staff conducted group or individual video education with verbal and written material and guidebook.  Patient learns how to incorporate Pritikin recommendations into their lifestyle. Recommendations include planning and keeping personal health goals in mind as an important part of their success.  Healthy Mind-Set    Healthy Minds, Bodies, Hearts  Clinical staff conducted group or individual video education with verbal and written material and guidebook.  Patient learns how to identify when they are stressed. Video will discuss the impact of that stress, as well as the many benefits of stress management. Patient will also be introduced to stress management techniques. The way we think, act, and feel has an impact on our hearts.  How Our Thoughts Can Heal Our Hearts  Clinical staff conducted group or individual video education with verbal and written material and guidebook.  Patient learns that negative thoughts can cause depression and anxiety. This can result in negative lifestyle behavior and serious health problems. Cognitive behavioral therapy is an effective method to help control our thoughts in order to change and improve our emotional outlook.  Additional  Videos:  Exercise    Improving Performance  Clinical staff conducted group or individual video education with verbal and written material and guidebook.  Patient learns to use a non-linear approach by alternating intensity levels and lengths of time spent exercising to help burn more calories and lose more body fat. Cardiovascular exercise helps improve heart health, metabolism, hormonal balance, blood sugar control, and recovery from fatigue. Resistance training improves strength, endurance, balance, coordination, reaction time, metabolism, and muscle mass. Flexibility exercise improves circulation, posture, and balance. Seek guidance from your physician and exercise physiologist before implementing an exercise routine and learn your capabilities and proper form for all exercise.  Introduction to Yoga  Clinical staff conducted group or individual video education with verbal and written material and guidebook.  Patient learns about yoga, a discipline of the coming together of mind, breath, and body. The benefits of yoga include improved flexibility, improved range of motion, better posture and core strength, increased lung function, weight loss, and positive self-image. Yoga's heart health benefits include lowered blood pressure, healthier heart rate, decreased cholesterol and triglyceride levels, improved immune function, and reduced stress. Seek guidance from your physician and exercise physiologist before implementing an exercise routine and learn your capabilities and proper form for all exercise.  Medical   Aging: Enhancing Your Quality of Life  Clinical staff conducted group or individual video education with verbal and written material and guidebook.  Patient learns key strategies and recommendations to stay in good physical health and enhance quality of life, such as prevention strategies, having an advocate, securing a Health Care Proxy and Power of Attorney, and keeping a list of medications  and system for tracking them. It also discusses how to avoid risk for bone loss.  Biology of Weight Control  Clinical staff conducted group or individual video education with verbal and written material and guidebook.  Patient learns that weight gain occurs because we consume more calories than we burn (eating more, moving less). Even if your body weight is normal, you may have higher ratios of fat compared to muscle mass. Too much body fat puts you at increased risk for cardiovascular disease, heart attack, stroke, type 2 diabetes, and obesity-related cancers. In addition to exercise, following the Pritikin Eating Plan can help reduce your risk.  Decoding Lab Results  Clinical staff conducted group or individual video education with verbal and written material and guidebook.  Patient learns that lab test reflects one measurement whose values change over time and are influenced by many factors, including medication, stress, sleep, exercise, food, hydration, pre-existing medical conditions, and more. It is recommended to use the knowledge from this video to become more involved with your lab results and evaluate your numbers to speak with your doctor.   Diseases of Our Time - Overview  Clinical staff conducted group or individual video education with verbal and written material and guidebook.  Patient learns that according to the CDC, 50% to 70% of chronic diseases (such as obesity, type 2 diabetes, elevated lipids, hypertension, and heart disease) are avoidable through lifestyle improvements including healthier food choices, listening to satiety cues, and increased physical activity.  Sleep Disorders Clinical staff conducted group or individual video education with verbal and written material and guidebook.  Patient learns how good quality and duration of sleep are important to overall health and well-being. Patient also learns about sleep disorders and how they impact health along with  recommendations to address them, including discussing with a physician.  Nutrition  Dining Out - Part 2 Clinical staff conducted group or individual video education with verbal and written material and guidebook.  Patient learns how to plan ahead and communicate in order to maximize their dining experience in a healthy and nutritious manner. Included are recommended food choices based on the type of restaurant the patient is visiting.   Fueling a Banker conducted group or individual video education with verbal and written material and guidebook.  There is a strong connection between our food choices and our health. Diseases like obesity and type 2 diabetes are very prevalent and are in large-part due to lifestyle choices. The Pritikin Eating Plan provides plenty of food and hunger-curbing satisfaction. It is easy to follow, affordable, and helps reduce health risks.  Menu Workshop  Clinical staff conducted group or individual video education with verbal and written material and guidebook.  Patient learns that restaurant meals can sabotage health goals because they are often packed with calories, fat, sodium, and sugar. Recommendations include strategies to plan ahead and to communicate with the manager, chef, or server to help order a healthier meal.  Planning Your Eating Strategy  Clinical staff conducted group or individual video education with verbal and written material and guidebook.  Patient learns about the  Pritikin Eating Plan and its benefit of reducing the risk of disease. The Pritikin Eating Plan does not focus on calories. Instead, it emphasizes high-quality, nutrient-rich foods. By knowing the characteristics of the foods, we choose, we can determine their calorie density and make informed decisions.  Targeting Your Nutrition Priorities  Clinical staff conducted group or individual video education with verbal and written material and guidebook.  Patient learns  that lifestyle habits have a tremendous impact on disease risk and progression. This video provides eating and physical activity recommendations based on your personal health goals, such as reducing LDL cholesterol, losing weight, preventing or controlling type 2 diabetes, and reducing high blood pressure.  Vitamins and Minerals  Clinical staff conducted group or individual video education with verbal and written material and guidebook.  Patient learns different ways to obtain key vitamins and minerals, including through a recommended healthy diet. It is important to discuss all supplements you take with your doctor.   Healthy Mind-Set    Smoking Cessation  Clinical staff conducted group or individual video education with verbal and written material and guidebook.  Patient learns that cigarette smoking and tobacco addiction pose a serious health risk which affects millions of people. Stopping smoking will significantly reduce the risk of heart disease, lung disease, and many forms of cancer. Recommended strategies for quitting are covered, including working with your doctor to develop a successful plan.  Culinary   Becoming a Set designer conducted group or individual video education with verbal and written material and guidebook.  Patient learns that cooking at home can be healthy, cost-effective, quick, and puts them in control. Keys to cooking healthy recipes will include looking at your recipe, assessing your equipment needs, planning ahead, making it simple, choosing cost-effective seasonal ingredients, and limiting the use of added fats, salts, and sugars.  Cooking - Breakfast and Snacks  Clinical staff conducted group or individual video education with verbal and written material and guidebook.  Patient learns how important breakfast is to satiety and nutrition through the entire day. Recommendations include key foods to eat during breakfast to help stabilize blood sugar  levels and to prevent overeating at meals later in the day. Planning ahead is also a key component.  Cooking - Educational psychologist conducted group or individual video education with verbal and written material and guidebook.  Patient learns eating strategies to improve overall health, including an approach to cook more at home. Recommendations include thinking of animal protein as a side on your plate rather than center stage and focusing instead on lower calorie dense options like vegetables, fruits, whole grains, and plant-based proteins, such as beans. Making sauces in large quantities to freeze for later and leaving the skin on your vegetables are also recommended to maximize your experience.  Cooking - Healthy Salads and Dressing Clinical staff conducted group or individual video education with verbal and written material and guidebook.  Patient learns that vegetables, fruits, whole grains, and legumes are the foundations of the Pritikin Eating Plan. Recommendations include how to incorporate each of these in flavorful and healthy salads, and how to create homemade salad dressings. Proper handling of ingredients is also covered. Cooking - Soups and [employer] - Soups and Desserts Clinical staff conducted group or individual video education with verbal and written material and guidebook.  Patient learns that Pritikin soups and desserts make for easy, nutritious, and delicious snacks and meal components that are low in sodium, fat, sugar,  and calorie density, while high in vitamins, minerals, and filling fiber. Recommendations include simple and healthy ideas for soups and desserts.   Overview     The Pritikin Solution Program Overview Clinical staff conducted group or individual video education with verbal and written material and guidebook.  Patient learns that the results of the Pritikin Program have been documented in more than 100 articles published in peer-reviewed  journals, and the benefits include reducing risk factors for (and, in some cases, even reversing) high cholesterol, high blood pressure, type 2 diabetes, obesity, and more! An overview of the three key pillars of the Pritikin Program will be covered: eating well, doing regular exercise, and having a healthy mind-set.  WORKSHOPS  Exercise: Exercise Basics: Building Your Action Plan Clinical staff led group instruction and group discussion with PowerPoint presentation and patient guidebook. To enhance the learning environment the use of posters, models and videos may be added. At the conclusion of this workshop, patients will comprehend the difference between physical activity and exercise, as well as the benefits of incorporating both, into their routine. Patients will understand the FITT (Frequency, Intensity, Time, and Type) principle and how to use it to build an exercise action plan. In addition, safety concerns and other considerations for exercise and cardiac rehab will be addressed by the presenter. The purpose of this lesson is to promote a comprehensive and effective weekly exercise routine in order to improve patients' overall level of fitness.   Managing Heart Disease: Your Path to a Healthier Heart Clinical staff led group instruction and group discussion with PowerPoint presentation and patient guidebook. To enhance the learning environment the use of posters, models and videos may be added.At the conclusion of this workshop, patients will understand the anatomy and physiology of the heart. Additionally, they will understand how Pritikin's three pillars impact the risk factors, the progression, and the management of heart disease.  The purpose of this lesson is to provide a high-level overview of the heart, heart disease, and how the Pritikin lifestyle positively impacts risk factors.  Exercise Biomechanics Clinical staff led group instruction and group discussion with PowerPoint  presentation and patient guidebook. To enhance the learning environment the use of posters, models and videos may be added. Patients will learn how the structural parts of their bodies function and how these functions impact their daily activities, movement, and exercise. Patients will learn how to promote a neutral spine, learn how to manage pain, and identify ways to improve their physical movement in order to promote healthy living. The purpose of this lesson is to expose patients to common physical limitations that impact physical activity. Participants will learn practical ways to adapt and manage aches and pains, and to minimize their effect on regular exercise. Patients will learn how to maintain good posture while sitting, walking, and lifting.  Balance Training and Fall Prevention  Clinical staff led group instruction and group discussion with PowerPoint presentation and patient guidebook. To enhance the learning environment the use of posters, models and videos may be added. At the conclusion of this workshop, patients will understand the importance of their sensorimotor skills (vision, proprioception, and the vestibular system) in maintaining their ability to balance as they age. Patients will apply a variety of balancing exercises that are appropriate for their current level of function. Patients will understand the common causes for poor balance, possible solutions to these problems, and ways to modify their physical environment in order to minimize their fall risk. The purpose of this lesson  is to teach patients about the importance of maintaining balance as they age and ways to minimize their risk of falling.  WORKSHOPS   Nutrition:  Fueling a Ship broker led group instruction and group discussion with PowerPoint presentation and patient guidebook. To enhance the learning environment the use of posters, models and videos may be added. Patients will review the  foundational principles of the Pritikin Eating Plan and understand what constitutes a serving size in each of the food groups. Patients will also learn Pritikin-friendly foods that are better choices when away from home and review make-ahead meal and snack options. Calorie density will be reviewed and applied to three nutrition priorities: weight maintenance, weight loss, and weight gain. The purpose of this lesson is to reinforce (in a group setting) the key concepts around what patients are recommended to eat and how to apply these guidelines when away from home by planning and selecting Pritikin-friendly options. Patients will understand how calorie density may be adjusted for different weight management goals.  Mindful Eating  Clinical staff led group instruction and group discussion with PowerPoint presentation and patient guidebook. To enhance the learning environment the use of posters, models and videos may be added. Patients will briefly review the concepts of the Pritikin Eating Plan and the importance of low-calorie dense foods. The concept of mindful eating will be introduced as well as the importance of paying attention to internal hunger signals. Triggers for non-hunger eating and techniques for dealing with triggers will be explored. The purpose of this lesson is to provide patients with the opportunity to review the basic principles of the Pritikin Eating Plan, discuss the value of eating mindfully and how to measure internal cues of hunger and fullness using the Hunger Scale. Patients will also discuss reasons for non-hunger eating and learn strategies to use for controlling emotional eating.  Targeting Your Nutrition Priorities Clinical staff led group instruction and group discussion with PowerPoint presentation and patient guidebook. To enhance the learning environment the use of posters, models and videos may be added. Patients will learn how to determine their genetic susceptibility to  disease by reviewing their family history. Patients will gain insight into the importance of diet as part of an overall healthy lifestyle in mitigating the impact of genetics and other environmental insults. The purpose of this lesson is to provide patients with the opportunity to assess their personal nutrition priorities by looking at their family history, their own health history and current risk factors. Patients will also be able to discuss ways of prioritizing and modifying the Pritikin Eating Plan for their highest risk areas  Menu  Clinical staff led group instruction and group discussion with PowerPoint presentation and patient guidebook. To enhance the learning environment the use of posters, models and videos may be added. Using menus brought in from [employer], or printed from [employer], patients will apply the Pritikin dining out guidelines that were presented in the [employer] video. Patients will also be able to practice these guidelines in a variety of provided scenarios. The purpose of this lesson is to provide patients with the opportunity to practice hands-on learning of the Pritikin Dining Out guidelines with actual menus and practice scenarios.  Label Reading Clinical staff led group instruction and group discussion with PowerPoint presentation and patient guidebook. To enhance the learning environment the use of posters, models and videos may be added. Patients will review and discuss the Pritikin label reading guidelines presented in Pritikin's Label  Reading Educational series video. Using fool labels brought in from local grocery stores and markets, patients will apply the label reading guidelines and determine if the packaged food meet the Pritikin guidelines. The purpose of this lesson is to provide patients with the opportunity to review, discuss, and practice hands-on learning of the Pritikin Label Reading guidelines with actual packaged food  labels. Cooking School  Pritikin's [employer] are designed to teach patients ways to prepare quick, simple, and affordable recipes at home. The importance of nutrition's role in chronic disease risk reduction is reflected in its emphasis in the overall Pritikin program. By learning how to prepare essential core Pritikin Eating Plan recipes, patients will increase control over what they eat; be able to customize the flavor of foods without the use of added salt, sugar, or fat; and improve the quality of the food they consume. By learning a set of core recipes which are easily assembled, quickly prepared, and affordable, patients are more likely to prepare more healthy foods at home. These workshops focus on convenient breakfasts, simple entres, side dishes, and desserts which can be prepared with minimal effort and are consistent with nutrition recommendations for cardiovascular risk reduction. Cooking [employer] are taught by a Armed forces logistics/support/administrative officer (RD) who has been trained by the [employer]. The chef or RD has a clear understanding of the importance of minimizing - if not completely eliminating - added fat, sugar, and sodium in recipes. Throughout the series of Cooking School Workshop sessions, patients will learn about healthy ingredients and efficient methods of cooking to build confidence in their capability to prepare    Cooking School weekly topics:  Adding Flavor- Sodium-Free  Fast and Healthy Breakfasts  Powerhouse Plant-Based Proteins  Satisfying Salads and Dressings  Simple Sides and Sauces  International Cuisine-Spotlight on the [employer] Zones  Delicious Desserts  Savory Soups  [employer] - Meals in a Astronomer Appetizers and Snacks  Comforting Weekend Breakfasts  One-Pot Wonders   Fast Evening Meals  Landscape architect Your Pritikin Plate  WORKSHOPS   Healthy Mindset (Psychosocial):  Focused Goals, Sustainable  Changes Clinical staff led group instruction and group discussion with PowerPoint presentation and patient guidebook. To enhance the learning environment the use of posters, models and videos may be added. Patients will be able to apply effective goal setting strategies to establish at least one personal goal, and then take consistent, meaningful action toward that goal. They will learn to identify common barriers to achieving personal goals and develop strategies to overcome them. Patients will also gain an understanding of how our mind-set can impact our ability to achieve goals and the importance of cultivating a positive and growth-oriented mind-set. The purpose of this lesson is to provide patients with a deeper understanding of how to set and achieve personal goals, as well as the tools and strategies needed to overcome common obstacles which may arise along the way.  From Head to Heart: The Power of a Healthy Outlook  Clinical staff led group instruction and group discussion with PowerPoint presentation and patient guidebook. To enhance the learning environment the use of posters, models and videos may be added. Patients will be able to recognize and describe the impact of emotions and mood on physical health. They will discover the importance of self-care and explore self-care practices which may work for them. Patients will also learn how to utilize the 4 C's to cultivate a healthier outlook and better manage stress  and challenges. The purpose of this lesson is to demonstrate to patients how a healthy outlook is an essential part of maintaining good health, especially as they continue their cardiac rehab journey.  Healthy Sleep for a Healthy Heart Clinical staff led group instruction and group discussion with PowerPoint presentation and patient guidebook. To enhance the learning environment the use of posters, models and videos may be added. At the conclusion of this workshop, patients will be able  to demonstrate knowledge of the importance of sleep to overall health, well-being, and quality of life. They will understand the symptoms of, and treatments for, common sleep disorders. Patients will also be able to identify daytime and nighttime behaviors which impact sleep, and they will be able to apply these tools to help manage sleep-related challenges. The purpose of this lesson is to provide patients with a general overview of sleep and outline the importance of quality sleep. Patients will learn about a few of the most common sleep disorders. Patients will also be introduced to the concept of "sleep hygiene," and discover ways to self-manage certain sleeping problems through simple daily behavior changes. Finally, the workshop will motivate patients by clarifying the links between quality sleep and their goals of heart-healthy living.   Recognizing and Reducing Stress Clinical staff led group instruction and group discussion with PowerPoint presentation and patient guidebook. To enhance the learning environment the use of posters, models and videos may be added. At the conclusion of this workshop, patients will be able to understand the types of stress reactions, differentiate between acute and chronic stress, and recognize the impact that chronic stress has on their health. They will also be able to apply different coping mechanisms, such as reframing negative self-talk. Patients will have the opportunity to practice a variety of stress management techniques, such as deep abdominal breathing, progressive muscle relaxation, and/or guided imagery.  The purpose of this lesson is to educate patients on the role of stress in their lives and to provide healthy techniques for coping with it.  Learning Barriers/Preferences:  Learning Barriers/Preferences - 01/24/23 1529       Learning Barriers/Preferences   Learning Barriers Sight    Learning Preferences Computer/Internet;Group Instruction;Individual  Instruction;Skilled Demonstration;Verbal Instruction             Education Topics:  Knowledge Questionnaire Score:  Knowledge Questionnaire Score - 01/24/23 1530       Knowledge Questionnaire Score   Pre Score 19/24             Core Components/Risk Factors/Patient Goals at Admission:  Personal Goals and Risk Factors at Admission - 01/24/23 1531       Core Components/Risk Factors/Patient Goals on Admission    Weight Management Yes;Obesity;Weight Loss    Intervention Weight Management: Develop a combined nutrition and exercise program designed to reach desired caloric intake, while maintaining appropriate intake of nutrient and fiber, sodium and fats, and appropriate energy expenditure required for the weight goal.;Weight Management: Provide education and appropriate resources to help participant work on and attain dietary goals.;Weight Management/Obesity: Establish reasonable short term and long term weight goals.;Obesity: Provide education and appropriate resources to help participant work on and attain dietary goals.    Admit Weight 226 lb 3.1 oz (102.6 kg)    Goal Weight: Long Term 200 lb (90.7 kg)    Expected Outcomes Short Term: Continue to assess and modify interventions until short term weight is achieved;Long Term: Adherence to nutrition and physical activity/exercise program aimed toward attainment of established weight  goal;Weight Loss: Understanding of general recommendations for a balanced deficit meal plan, which promotes 1-2 lb weight loss per week and includes a negative energy balance of [id number] kcal/d;Understanding recommendations for meals to include 15-35% energy as protein, 25-35% energy from fat, 35-60% energy from carbohydrates, less than 200mg  of dietary cholesterol, 20-35 gm of total fiber daily;Understanding of distribution of calorie intake throughout the day with the consumption of 4-5 meals/snacks    Heart Failure Yes    Intervention Provide a combined  exercise and nutrition program that is supplemented with education, support and counseling about heart failure. Directed toward relieving symptoms such as shortness of breath, decreased exercise tolerance, and extremity edema.    Expected Outcomes Improve functional capacity of life;Short term: Attendance in program 2-3 days a week with increased exercise capacity. Reported lower sodium intake. Reported increased fruit and vegetable intake. Reports medication compliance.;Short term: Daily weights obtained and reported for increase. Utilizing diuretic protocols set by physician.;Long term: Adoption of self-care skills and reduction of barriers for early signs and symptoms recognition and intervention leading to self-care maintenance.    Hypertension Yes    Intervention Provide education on lifestyle modifcations including regular physical activity/exercise, weight management, moderate sodium restriction and increased consumption of fresh fruit, vegetables, and low fat dairy, alcohol moderation, and smoking cessation.;Monitor prescription use compliance.    Expected Outcomes Short Term: Continued assessment and intervention until BP is < 140/30mm HG in hypertensive participants. < 130/27mm HG in hypertensive participants with diabetes, heart failure or chronic kidney disease.;Long Term: Maintenance of blood pressure at goal levels.    Stress Yes    Intervention Offer individual and/or small group education and counseling on adjustment to heart disease, stress management and health-related lifestyle change. Teach and support self-help strategies.;Refer participants experiencing significant psychosocial distress to appropriate mental health specialists for further evaluation and treatment. When possible, include family members and significant others in education/counseling sessions.    Expected Outcomes Short Term: Participant demonstrates changes in health-related behavior, relaxation and other stress management  skills, ability to obtain effective social support, and compliance with psychotropic medications if prescribed.;Long Term: Emotional wellbeing is indicated by absence of clinically significant psychosocial distress or social isolation.             Core Components/Risk Factors/Patient Goals Review:   Goals and Risk Factor Review     Row Name 01/29/23 1610 01/31/23 1139 03/06/23 1306         Core Components/Risk Factors/Patient Goals Review   Personal Goals Review Weight Management/Obesity;Heart Failure;Hypertension;Stress Weight Management/Obesity;Heart Failure;Hypertension;Stress Weight Management/Obesity;Heart Failure;Hypertension;Stress     Review Krisite started intensive cardiac rehab on 01/28/23 and did well with exercise. Vital signs were stable. Systolic BP between 90-120. Krisitie did report some right knee pain Krisite started intensive cardiac rehab on 01/28/23 and is off to a good start  with exercise. Obtained parameters to exercise for systolic BP greater than 90. Blood pressures are improved since aldactdone dose has been decreased Verna Czech has good participation in  intensive cardiac rehab when in attendance. Ralphine has lost 1.6 kg since starting cardiac rehab. Verna Czech has not participated in cardiac rehab since 02/20/23     Expected Outcomes Krisitie will continue to participate in intensive cardiac rehab for exercise, nutrition and lifestyle modifications Krisitie will continue to participate in intensive cardiac rehab for exercise, nutrition and lifestyle modifications Krisitie will continue to participate in intensive cardiac rehab for exercise, nutrition and lifestyle modifications  Core Components/Risk Factors/Patient Goals at Discharge (Final Review):   Goals and Risk Factor Review - 03/06/23 1306       Core Components/Risk Factors/Patient Goals Review   Personal Goals Review Weight Management/Obesity;Heart Failure;Hypertension;Stress    Review  Verna Czech has good participation in  intensive cardiac rehab when in attendance. Corine has lost 1.6 kg since starting cardiac rehab. Verna Czech has not participated in cardiac rehab since 02/20/23    Expected Outcomes Krisitie will continue to participate in intensive cardiac rehab for exercise, nutrition and lifestyle modifications             ITP Comments:  ITP Comments     Row Name 01/24/23 1415 01/29/23 0757 03/06/23 1301       ITP Comments Dr. Armanda Magic medical director. Introduction to pritikin education program/ intensive cardiac rehab. Initial orientation packet reviewed with patient. 30 Day ITP Review.  Azailya started intensive cardiac rehab on 01/28/23 and did well with exercise. 30 Day ITP Review.  Rye has good participation in  intensive cardiac rehab when in attendance. Quinnlee has been absent since 02/20/23 due to work obligations and plans to return to exercise on 03/08/23              Comments: See ITP comments.Thayer Headings RN BSN

## 2023-03-06 NOTE — Telephone Encounter (Signed)
Spoke with Karen Lutz she has been busy with work. Toiya plans to return to cardiac rehab on Friday.Thayer Headings RN BSN

## 2023-03-07 ENCOUNTER — Encounter: Payer: Self-pay | Admitting: Cardiology

## 2023-03-07 ENCOUNTER — Ambulatory Visit: Payer: BC Managed Care – PPO | Attending: Cardiology | Admitting: Cardiology

## 2023-03-07 NOTE — Progress Notes (Deleted)
  Electrophysiology Office Follow up Visit Note:    Date:  03/07/2023   ID:  Karen Lutz, DOB [birth year], MRN [id number]  PCP:  Verlee Rossetti, PA-C  [facility] Cardiologist:  None  [facility] Electrophysiologist:  Lanier Prude, MD    Interval History:    Karen Lutz is a 52 y.o. female who presents for a follow up visit.   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      Past medical, surgical, social and family history were reviewed.  ROS:   Please see the history of present illness.    All other systems reviewed and are negative.  EKGs/Labs/Other Studies Reviewed:    The following studies were reviewed today:  March 07, 2023 in clinic device interrogation personally reviewed ***       Physical Exam:    VS:  LMP  (LMP Unknown)     Wt Readings from Last 3 Encounters:  02/22/23 222 lb 3.2 oz (100.8 kg)  01/24/23 226 lb 3.1 oz (102.6 kg)  12/21/22 231 lb 6.4 oz (105 kg)     GEN: *** Well nourished, well developed in no acute distress CARDIAC: ***RRR, no murmurs, rubs, gallops.  ICD pocket well-healed RESPIRATORY:  Clear to auscultation without rales, wheezing or rhonchi       ASSESSMENT:    1. Chronic combined systolic and diastolic heart failure (HCC)   2. Cardiac arrest (HCC)   3. Morbid obesity (HCC)    PLAN:    In order of problems listed above:  #Chronic systolic heart failure NYHA class II today.  Warm dry on exam.  Continue spironolactone, Entresto, Lasix, Farxiga, Coreg Follows with Dr. 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  She has follow-up scheduled with the geneticist. Device function appropriately.  Continue remote monitoring   Follow-up 1 year with APP.      Signed, Steffanie Dunn, MD, [facility], [facility] 03/07/2023  9:11 AM    Electrophysiology [facility]

## 2023-03-08 ENCOUNTER — Encounter (HOSPITAL_COMMUNITY): Payer: BC Managed Care – PPO

## 2023-03-11 ENCOUNTER — Encounter (HOSPITAL_COMMUNITY): Payer: Self-pay

## 2023-03-11 ENCOUNTER — Emergency Department (HOSPITAL_COMMUNITY)
Admission: EM | Admit: 2023-03-11 | Discharge: 2023-03-11 | Disposition: A | Discharge status: Home / Self Care | Payer: BC Managed Care – PPO | Attending: Emergency Medicine | Admitting: Emergency Medicine

## 2023-03-11 ENCOUNTER — Encounter (HOSPITAL_COMMUNITY): Payer: BC Managed Care – PPO

## 2023-03-11 ENCOUNTER — Other Ambulatory Visit: Payer: Self-pay

## 2023-03-11 DIAGNOSIS — N898 Other specified noninflammatory disorders of vagina: Secondary | ICD-10-CM

## 2023-03-11 DIAGNOSIS — Z7982 Long term (current) use of aspirin: Secondary | ICD-10-CM | POA: Insufficient documentation

## 2023-03-11 DIAGNOSIS — N952 Postmenopausal atrophic vaginitis: Secondary | ICD-10-CM | POA: Diagnosis not present

## 2023-03-11 LAB — URINALYSIS, ROUTINE W REFLEX MICROSCOPIC
Bacteria, UA: NONE SEEN
Bilirubin Urine: NEGATIVE
Glucose, UA: >=500 mg/dL — AB
Hgb urine dipstick: NEGATIVE
Ketones, ur: NEGATIVE mg/dL
Leukocytes,Ua: NEGATIVE
Nitrite: NEGATIVE
Protein, ur: NEGATIVE mg/dL
Specific Gravity, Urine: 1.022 (ref 1.005–1.030)
pH: 5 (ref 5.0–8.0)

## 2023-03-11 LAB — WET PREP, GENITAL
Clue Cells Wet Prep HPF POC: NONE SEEN
Sperm: NONE SEEN
Trich, Wet Prep: NONE SEEN
WBC, Wet Prep HPF POC: <10 (ref <10)
Yeast Wet Prep HPF POC: NONE SEEN

## 2023-03-11 LAB — PREGNANCY, URINE: Preg Test, Ur: NEGATIVE

## 2023-03-11 MED ORDER — LIDOCAINE 5 % EX OINT: 1.0000 | TOPICAL_OINTMENT | Freq: Two times a day (BID) | CUTANEOUS | 0 refills | Status: DC | PRN

## 2023-03-11 NOTE — Discharge Instructions (Addendum)
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  You have thinning out of the mucous membranes in your vaginal canal.  I have attached some information for you to read about this.  He will need to follow-up with an OB/GYN at community information for an office or you can follow-up with your primary care OB/GYN.   Please use Tylenol or ibuprofen for pain.  You may use 600 mg ibuprofen every 6 hours or 1000 mg of Tylenol every 6 hours.  You may choose to alternate between the 2.  This would be most effective.  Not to exceed 4 g of Tylenol within 24 hours.  Not to exceed 3200 mg ibuprofen 24 hours.

## 2023-03-11 NOTE — ED Triage Notes (Addendum)
Pt complaining of irritation, burning, and itching in her groin area. Pt has tried fluconazole, monistat, and azo yeast to try and treat it without relief. Pt also states that she has had more urinary frequency too.

## 2023-03-11 NOTE — ED Provider Notes (Signed)
[place] EMERGENCY DEPARTMENT AT [facility] Provider Note   CSN: [id number] Arrival date & time: 03/11/23  1656     History  Chief Complaint  Patient presents with   Groin discomfort    Karen Lutz is a 52 y.o. female.  HPI  Patient is a 52 year old female present emergency room today with complaints of burning itching and vaginal discomfort.  She states that she has tried fluconazole, Monistat, other yeast treatments.  It seems that she has not seen an OB/GYN.  She states that she has also noticed that she does pee more often it seems to burn her vaginal tissue when she urinates however she is not having blood in her urine per her account.  No abnormal vaginal discharge.  No abdominal pain nausea vomiting or fevers.     Home Medications Prior to Admission medications   Medication Sig Start Date End Date Taking? Authorizing Provider  aspirin 81 MG chewable tablet Chew 1 tablet (81 mg total) by mouth daily. 11/10/22   Arrien, York Ram, MD  carvedilol (COREG) 6.25 MG tablet Take 1 tablet (6.25 mg total) by mouth 2 (two) times daily with a meal. 01/14/23   Sabharwal, Aditya, DO  dapagliflozin propanediol (FARXIGA) 10 MG TABS tablet Take 1 tablet (10 mg total) by mouth daily. 01/03/23   Sabharwal, Aditya, DO  furosemide (LASIX) 40 MG tablet Take 1 tablet (40 mg total) by mouth daily. 12/07/22   Sabharwal, Aditya, DO  omeprazole (PRILOSEC OTC) 20 MG tablet Take 20 mg by mouth daily. Check with Dr about getting prescription strength omperazole    [provider]  sacubitril-valsartan (ENTRESTO) 49-51 MG Take 1 tablet by mouth 2 (two) times daily. 12/07/22   Sabharwal, Aditya, DO  spironolactone (ALDACTONE) 25 MG tablet Take 1 tablet (25 mg total) by mouth at bedtime. Please cancel all previous orders for current medication. Change in dosage or pill size. 02/22/23   Sabharwal, Aditya, DO  albuterol (VENTOLIN HFA) 108 (90 Base) MCG/ACT inhaler Inhale 2 puffs into  the lungs every 4 (four) hours as needed for wheezing or shortness of breath. Patient not taking: Reported on 10/30/2020 09/08/20 11/24/20  Valinda Hoar, NP      Allergies    Penicillins and Penicillin g    Review of Systems   Review of Systems  Physical Exam Updated Vital Signs BP 106/69   Pulse 63   Temp 98 F (36.7 C) (Oral)   Resp 17   Ht 5\' 6"  (1.676 m)   Wt 98 kg   LMP  (LMP Unknown)   SpO2 99%   BMI 34.86 kg/m  Physical Exam Vitals and nursing note reviewed.  Constitutional:      General: She is not in acute distress. HENT:     Head: Normocephalic and atraumatic.     Nose: Nose normal.  Eyes:     General: No scleral icterus. Cardiovascular:     Rate and Rhythm: Normal rate and regular rhythm.     Pulses: Normal pulses.     Heart sounds: Normal heart sounds.  Pulmonary:     Effort: Pulmonary effort is normal. No respiratory distress.     Breath sounds: No wheezing.  Abdominal:     Palpations: Abdomen is soft.     Tenderness: There is no abdominal tenderness.  Genitourinary:    Comments: Speculum and bimanual GU exam unremarkable.  Somewhat silvery shiny vaginal mucosal membrane. Musculoskeletal:     Cervical back: Normal range of  motion.     Right lower leg: No edema.     Left lower leg: No edema.  Skin:    General: Skin is warm and dry.     Capillary Refill: Capillary refill takes less than 2 seconds.  Neurological:     Mental Status: She is alert. Mental status is at baseline.  Psychiatric:        Mood and Affect: Mood normal.        Behavior: Behavior normal.     ED Results / Procedures / Treatments   Labs (all labs ordered are listed, but only abnormal results are displayed) Labs Reviewed  URINALYSIS, ROUTINE W REFLEX MICROSCOPIC - Abnormal; Notable for the following components:      Result Value   APPearance CLOUDY (*)    Glucose, UA >=500 (*)    All other components within normal limits  WET PREP, GENITAL  PREGNANCY, URINE   GC/CHLAMYDIA PROBE AMP ([place]) NOT AT [facility]    EKG None  Radiology No results found.  Procedures Procedures    Medications Ordered in ED Medications - No data to display  ED Course/ Medical Decision Making/ A&P                                 Medical Decision Making Amount and/or Complexity of Data Reviewed Labs: ordered.  Risk Prescription drug management.   Patient is a 51 year old female present emergency room today with complaints of burning itching and vaginal discomfort.  She states that she has tried fluconazole, Monistat, other yeast treatments.  It seems that she has not seen an OB/GYN.  She states that she has also noticed that she does pee more often it seems to burn her vaginal tissue when she urinates however she is not having blood in her urine per her account.  No abnormal vaginal discharge.  No abdominal pain nausea vomiting or fevers.  Urinalysis unremarkable, wet prep unremarkable, pregnancy test negative.  Gonorrhea chlamydia pending.  I do lengthy discussion with her about the need for OB/GYN follow-up.  I do not appreciate any infection signs her vital signs are normal and she is well-appearing her symptoms been ongoing for quite some time.  I recommend that she follow-up with OB/GYN and discussed the possibility of atrophic vaginitis.   Final Clinical Impression(s) / ED Diagnoses Final diagnoses:  Atrophic vaginitis  Vaginal irritation    Rx / DC Orders ED Discharge Orders     None         Gailen Shelter, [place] 03/11/23 2301    Charlynne Pander, MD 03/11/23 [phone]

## 2023-03-13 ENCOUNTER — Encounter (HOSPITAL_COMMUNITY): Admission: RE | Admit: 2023-03-13 | Payer: BC Managed Care – PPO | Source: Ambulatory Visit

## 2023-03-15 ENCOUNTER — Encounter (HOSPITAL_COMMUNITY): Payer: BC Managed Care – PPO | Attending: Cardiology

## 2023-03-15 DIAGNOSIS — I428 Other cardiomyopathies: Secondary | ICD-10-CM | POA: Insufficient documentation

## 2023-03-15 DIAGNOSIS — Z95810 Presence of automatic (implantable) cardiac defibrillator: Secondary | ICD-10-CM | POA: Insufficient documentation

## 2023-03-15 DIAGNOSIS — Z48812 Encounter for surgical aftercare following surgery on the circulatory system: Secondary | ICD-10-CM | POA: Insufficient documentation

## 2023-03-15 DIAGNOSIS — I340 Nonrheumatic mitral (valve) insufficiency: Secondary | ICD-10-CM | POA: Insufficient documentation

## 2023-03-15 DIAGNOSIS — I5042 Chronic combined systolic (congestive) and diastolic (congestive) heart failure: Secondary | ICD-10-CM | POA: Insufficient documentation

## 2023-03-18 ENCOUNTER — Encounter (HOSPITAL_COMMUNITY): Payer: BC Managed Care – PPO

## 2023-03-19 ENCOUNTER — Ambulatory Visit: Payer: BC Managed Care – PPO | Admitting: Genetic Counselor

## 2023-03-20 ENCOUNTER — Encounter (HOSPITAL_COMMUNITY): Payer: BC Managed Care – PPO

## 2023-03-20 NOTE — Progress Notes (Deleted)
Patient ID: Karen Lutz                 DOB: [birth year]                      MRN: [id number]     HPI: SHANESHA WHITEHORSE is a 52 y.o. female referred by Dr. Gasper Lloyd to pharmacy clinic for HF medication management. PMH is significant for heart failure with reduced action fraction, obesity status post bariatric surgery in November 2023 at [facility] and a significant family history of early systolic heart failure. Her cardiac history dates back to November 01, 2022 when she was found unresponsive by her boyfriend. Her rhythm on arrival was ventricular fibrillation with an unknown downtime. She underwent CPR for 23 minutes before ROSC. On arrival she was found to be hypokalemic with a QTc of 511 ms with normalization after correction of hypokalemia. She was intubated and placed on pressors with echocardiogram demonstrating EF of 25%, cardiac MRI with a EF of 22% and preserved RV function. She had a Biotronik secondary prevention ICD placed was started on GDMT and discharged home 1 week later. Most recent LVEF 25-30% on 02/06/23. Last saw Dr. Gasper Lloyd 02/22/23 and pt was doing well, HF GDMT spironolactone was increased to 25 mg, Entresto 49/51 mg, Farxiga 10 mg, and carvedilol 6.25 mg BID were continued. Patient called 03/01/23 reporting UTI symptoms and was instructed to stop [place] and present to PCP for UA. ED visit on 03/11/23 with continued symptoms. She followed up with PCP on 03/12/23 for yeast infection treatment with fluconazole, was hypotensive to 81/58, pulse 68. PCP decreased carvedilol to 3.125 mg BID (1/2 tab BID) and decreased furosemide to 20 mg daily. Of note, pt also recently referred to pharmacy for weight loss management with GLP-1, however she does not have diabetes and her insurance will not cover GLP-1s for weight loss indications.  Discussion with patient today included the following: cardiac medication indications, review of GDMT including reasoning behind medication  titration, importance of medication adherence, and patient engagement. ***Denies dizziness, fatigue, LE edema, or SOB. Able to complete all ADLs. *** checks daily weights at home. *** adheres to a low-sodium diet.  Current CHF meds: Entresto 49-51 mg BID, spironolactone 25 mg daily, carvedilol 3.125 mg BID (1/2 tab of 6.25 mg tablets) Previously tried: [place] 10 mg (yeast infection) BP goal: <130/80  Family History: mother- heart disease; father- heart disease  Social History: no tobacco, no alcohol  Diet:   Exercise:   Home BP readings:   Wt Readings from Last 3 Encounters:  03/11/23 216 lb (98 kg)  02/22/23 222 lb 3.2 oz (100.8 kg)  01/24/23 226 lb 3.1 oz (102.6 kg)   BP Readings from Last 3 Encounters:  03/11/23 101/64  02/22/23 102/70  01/24/23 (!) 89/58   Pulse Readings from Last 3 Encounters:  03/11/23 64  02/22/23 (!) 58  01/24/23 63    Renal function: CrCl cannot be calculated (Patient's most recent lab result is older than the maximum 21 days allowed.).  Past Medical History:  Diagnosis Date   Acid reflux    Anemia    Fibroids    Heart murmur    Knee pain    UTI (lower urinary tract infection)     Current Outpatient Medications on File Prior to Visit  Medication Sig Dispense Refill   aspirin 81 MG chewable tablet Chew 1 tablet (81 mg total) by mouth daily. 30 tablet 3  carvedilol (COREG) 6.25 MG tablet Take 1 tablet (6.25 mg total) by mouth 2 (two) times daily with a meal. 60 tablet 6   dapagliflozin propanediol (FARXIGA) 10 MG TABS tablet Take 1 tablet (10 mg total) by mouth daily. 30 tablet 3   furosemide (LASIX) 40 MG tablet Take 1 tablet (40 mg total) by mouth daily. 30 tablet 3   lidocaine (XYLOCAINE) 5 % ointment Apply 1 Application topically 2 (two) times daily as needed. 30 g 0   omeprazole (PRILOSEC OTC) 20 MG tablet Take 20 mg by mouth daily. Check with Dr about getting prescription strength omperazole     sacubitril-valsartan (ENTRESTO) 49-51  MG Take 1 tablet by mouth 2 (two) times daily. 60 tablet 3   spironolactone (ALDACTONE) 25 MG tablet Take 1 tablet (25 mg total) by mouth at bedtime. Please cancel all previous orders for current medication. Change in dosage or pill size. 30 tablet 6   [DISCONTINUED] albuterol (VENTOLIN HFA) 108 (90 Base) MCG/ACT inhaler Inhale 2 puffs into the lungs every 4 (four) hours as needed for wheezing or shortness of breath. (Patient not taking: Reported on 10/30/2020) 18 g 0   No current facility-administered medications on file prior to visit.    Allergies  Allergen Reactions   Penicillins Rash    Has patient had a PCN reaction causing immediate rash, facial/tongue/throat swelling, SOB or lightheadedness with hypotension: unknown Has patient had a PCN reaction causing severe rash involving mucus membranes or skin necrosis: unknown Has patient had a PCN reaction that required hospitalization : yes Has patient had a PCN reaction occurring within the last 10 years: no If all of the above answers are "NO", then may proceed with Cephalosporin use.    Penicillin G Other (See Comments)    Per patient, childhood allergy, reaction unknown.       Assessment/Plan:  1. CHF -    Adam Phenix, PharmD PGY-1 Pharmacy Resident

## 2023-03-21 ENCOUNTER — Ambulatory Visit: Payer: BC Managed Care – PPO

## 2023-03-21 ENCOUNTER — Telehealth: Payer: Self-pay | Admitting: Pharmacist

## 2023-03-21 NOTE — Telephone Encounter (Signed)
Patient has apt today with PharmD, however GLP-1 was denied by her insurance. I reached out to patient to see if she wanted to cancel apt. She said that she spoke to her insurance and they said they would pay for CV risk reduction. Patient does not have CAD. She has HF but this isnt an indication. I told her I would try anyway. Submitted PA and it was denied but did not ask about CV. I have submitted an appeals via CMM. Appeals was also denied Called pt and LVM per [facility]

## 2023-03-22 ENCOUNTER — Encounter (HOSPITAL_COMMUNITY): Payer: BC Managed Care – PPO

## 2023-03-24 ENCOUNTER — Other Ambulatory Visit (HOSPITAL_COMMUNITY): Payer: Self-pay | Admitting: Cardiology

## 2023-03-25 ENCOUNTER — Encounter (HOSPITAL_COMMUNITY): Payer: BC Managed Care – PPO

## 2023-03-27 ENCOUNTER — Telehealth (HOSPITAL_COMMUNITY): Payer: Self-pay | Admitting: *Deleted

## 2023-03-27 ENCOUNTER — Encounter (HOSPITAL_COMMUNITY): Payer: BC Managed Care – PPO

## 2023-03-27 ENCOUNTER — Encounter (HOSPITAL_COMMUNITY): Payer: Self-pay | Admitting: *Deleted

## 2023-03-27 DIAGNOSIS — I428 Other cardiomyopathies: Secondary | ICD-10-CM

## 2023-03-27 NOTE — Telephone Encounter (Signed)
Spoke with the patient mutually agreed to discharge form cardiac rehab. Karen Lutz has been absent since 02/20/23. Krisite attended 15 exercise sessions.Gladstone Lighter, RN,BSN 03/27/2023 2:29 PM

## 2023-03-27 NOTE — Progress Notes (Signed)
Discharge Progress Report  Patient Details  Name: MARSHA HARPS MRN: [id number] Date of Birth: [birth year] Referring Provider:   Flowsheet Row INTENSIVE CARDIAC REHAB ORIENT from 01/24/2023 in [facility]  Referring Provider Dorthula Nettles, DO        Number of Visits: 15  Reason for Discharge:  Early Exit:  Lack of attendance  Smoking History:  Social History   Tobacco Use  Smoking Status Never  Smokeless Tobacco Never    Diagnosis:  11/01/22 V fib Arrest  NICM  ADL UCSD:   Initial Exercise Prescription:   Discharge Exercise Prescription (Final Exercise Prescription Changes):  Exercise Prescription Changes - 02/11/23 1623       Response to Exercise   Blood Pressure (Admit) 104/58    Blood Pressure (Exercise) 110/66    Blood Pressure (Exit) 94/93    Heart Rate (Admit) 71 bpm    Heart Rate (Exercise) 102 bpm    Heart Rate (Exit) 63 bpm    Rating of Perceived Exertion (Exercise) 11.5    Perceived Dyspnea (Exercise) 0    Symptoms 0    Comments REVD MET's and goals    Duration Progress to 30 minutes of  aerobic without signs/symptoms of physical distress    Intensity THRR unchanged      Progression   Progression Continue to progress workloads to maintain intensity without signs/symptoms of physical distress.    Average METs 2.45      Resistance Training   Training Prescription Yes    Weight 2    Reps 10-15    Time 10 Minutes      Recumbant Bike   Level 2    RPM 64    Watts 30    Minutes 15    METs 2.2      NuStep   Level 2    SPM 112    Minutes 15    METs 2.7             Functional Capacity:   Psychological, QOL, Others - Outcomes: PHQ 2/9:    01/24/2023    3:24 PM 02/15/2011    1:43 PM  Depression screen PHQ 2/9  Decreased Interest 1 3  Down, Depressed, Hopeless 1 3  PHQ - 2 Score 2 6  Altered sleeping 0 3  Tired, decreased energy 1 3  Change in appetite 2 3   Feeling bad or failure about yourself  1 3  Trouble concentrating 1 0  Moving slowly or fidgety/restless 0 3  Suicidal thoughts 0 --  PHQ-9 Score 7 21  Difficult doing work/chores Not difficult at all     Quality of Life:   Personal Goals: Goals established at orientation with interventions provided to work toward goal.    Personal Goals Discharge:  Goals and Risk Factor Review     Row Name 01/29/23 0821 01/31/23 1139 03/06/23 1306 03/27/23 1434       Core Components/Risk Factors/Patient Goals Review   Personal Goals Review Weight Management/Obesity;Heart Failure;Hypertension;Stress Weight Management/Obesity;Heart Failure;Hypertension;Stress Weight Management/Obesity;Heart Failure;Hypertension;Stress Weight Management/Obesity;Heart Failure;Hypertension;Stress    Review Krisite started intensive cardiac rehab on 01/28/23 and did well with exercise. Vital signs were stable. Systolic BP between 90-120. Krisitie did report some right knee pain Krisite started intensive cardiac rehab on 01/28/23 and is off to a good start  with exercise. Obtained parameters to exercise for systolic BP greater than 90. Blood pressures are improved since aldactdone dose has been decreased  Verna Czech has good participation in  intensive cardiac rehab when in attendance. Ople has lost 1.6 kg since starting cardiac rehab. Verna Czech has not participated in cardiac rehab since 02/20/23 Verna Czech has not returned to exercise since 02/20/23. Will discharge due to nonattendance.    Expected Outcomes Krisitie will continue to participate in intensive cardiac rehab for exercise, nutrition and lifestyle modifications Krisitie will continue to participate in intensive cardiac rehab for exercise, nutrition and lifestyle modifications Krisitie will continue to participate in intensive cardiac rehab for exercise, nutrition and lifestyle modifications Hopefully Verna Czech will continue  exercise, nutrition and lifestyle on her own              Exercise Goals and Review:   Exercise Goals Re-Evaluation:  Exercise Goals Re-Evaluation     Row Name 01/28/23 1628 02/11/23 1625           Exercise Goal Re-Evaluation   Exercise Goals Review Increase Physical Activity;Understanding of Exercise Prescription;Increase Strength and Stamina;Knowledge and understanding of Target Heart Rate Range (THRR);Able to understand and use rate of perceived exertion (RPE) scale Increase Physical Activity;Understanding of Exercise Prescription;Increase Strength and Stamina;Knowledge and understanding of Target Heart Rate Range (THRR);Able to understand and use rate of perceived exertion (RPE) scale      Comments Pt first day in the [facility] ICR program. Pt tolerated exercise well with an average MET level of 1.85. Pt is learning her THRR, RPE and ExRx. She's off to a good start REVD MET's and goals. Evaluated home ExRx, but patient states exercise here in enough right now and she is still challenging herself with her stairs at home. Talked about reevaluating home exrx to achieve 5-7 of exercise to meet [facility] standards further along as she gains strength. Pt tolerated exercise well with an average MET level of 2.45. Pt feels good about her goals so far and is making progress      Expected Outcomes Will continue to monitor pt and progress workloads as tolerated without sign or symptom Will continue to monitor pt and progress workloads as tolerated without sign or symptom               Nutrition & Weight - Outcomes:    Nutrition:  Nutrition Therapy & Goals - 02/27/23 1342       Nutrition Therapy   Diet Heart Healthy/Bariatric Diet      Personal Nutrition Goals   Nutrition Goal Patient to identify strategies for reducing cardiovascular risk by attending the [facility] education and nutrition series weekly.   goal in progress.   Personal Goal #2 Patient to improve diet quality by using the plate method as a guide for meal planning to include  lean protein/plant protein, fruits, vegetables, whole grains, nonfat dairy as part of a well-balanced diet.   goal in progress.   Personal Goal #3 Patient to limit sodium to 1500mg  per day   goal in progress.   Personal Goal #4 Patient to begin bariatric supplements daily.   goal in progress.   Comments Goals in progress. Anariah continues to attend the [employer] and nutrition series regularly. Karie is s/p duodenal switch single anastamosis on 06/06/2022 at 316#; she is down 94# in ~56months (28% body weight). She is down 3.5# since starting with our program. We have discussed the importance of bariatric supplementation and gave resources.  She continues one protein shake daily (Premier, 30g protein). Patient will benefit from participation in intensive cardiac rehab for nutrition, exercise, and lifestyle modification.  Intervention Plan   Intervention Prescribe, educate and counsel regarding individualized specific dietary modifications aiming towards targeted core components such as weight, hypertension, lipid management, diabetes, heart failure and other comorbidities.;Nutrition handout(s) given to patient.    Expected Outcomes Short Term Goal: Understand basic principles of dietary content, such as calories, fat, sodium, cholesterol and nutrients.;Long Term Goal: Adherence to prescribed nutrition plan.;Short Term Goal: A plan has been developed with personal nutrition goals set during dietitian appointment.             Nutrition Discharge:   Education Questionnaire Score:   Krisitie completed 15 exercise and education sessions between 01/24/23-02/20/23 . Kaytee progressed nicely during their participation in rehab as evidenced by increased MET level when in participation. Krisitie was discharged due to nonattendance.Thayer Headings RN BSN

## 2023-03-27 NOTE — Progress Notes (Incomplete Revision)
Discharge Progress Report  Patient Details  Name: SEPHORA BENZON MRN: [id number] Date of Birth: 03/28/1971 Referring Provider:   Flowsheet Row INTENSIVE CARDIAC REHAB ORIENT from 01/24/2023 in [facility]  Referring Provider Dorthula Nettles, DO        Number of Visits: 15  Reason for Discharge:  Early Exit:  Lack of attendance  Smoking History:  Social History   Tobacco Use  Smoking Status Never  Smokeless Tobacco Never    Diagnosis:  11/01/22 V fib Arrest  NICM  ADL UCSD:   Initial Exercise Prescription:   Discharge Exercise Prescription (Final Exercise Prescription Changes):  Exercise Prescription Changes - 02/11/23 1623       Response to Exercise   Blood Pressure (Admit) 104/58    Blood Pressure (Exercise) 110/66    Blood Pressure (Exit) 94/93    Heart Rate (Admit) 71 bpm    Heart Rate (Exercise) 102 bpm    Heart Rate (Exit) 63 bpm    Rating of Perceived Exertion (Exercise) 11.5    Perceived Dyspnea (Exercise) 0    Symptoms 0    Comments REVD MET's and goals    Duration Progress to 30 minutes of  aerobic without signs/symptoms of physical distress    Intensity THRR unchanged      Progression   Progression Continue to progress workloads to maintain intensity without signs/symptoms of physical distress.    Average METs 2.45      Resistance Training   Training Prescription Yes    Weight 2    Reps 10-15    Time 10 Minutes      Recumbant Bike   Level 2    RPM 64    Watts 30    Minutes 15    METs 2.2      NuStep   Level 2    SPM 112    Minutes 15    METs 2.7             Functional Capacity:   Psychological, QOL, Others - Outcomes: PHQ 2/9:    01/24/2023    3:24 PM 02/15/2011    1:43 PM  Depression screen PHQ 2/9  Decreased Interest 1 3  Down, Depressed, Hopeless 1 3  PHQ - 2 Score 2 6  Altered sleeping 0 3  Tired, decreased energy 1 3  Change in appetite 2 3   Feeling bad or failure about yourself  1 3  Trouble concentrating 1 0  Moving slowly or fidgety/restless 0 3  Suicidal thoughts 0 --  PHQ-9 Score 7 21  Difficult doing work/chores Not difficult at all     Quality of Life:   Personal Goals: Goals established at orientation with interventions provided to work toward goal.    Personal Goals Discharge:  Goals and Risk Factor Review     Row Name 01/29/23 0821 01/31/23 1139 03/06/23 1306 03/27/23 1434       Core Components/Risk Factors/Patient Goals Review   Personal Goals Review Weight Management/Obesity;Heart Failure;Hypertension;Stress Weight Management/Obesity;Heart Failure;Hypertension;Stress Weight Management/Obesity;Heart Failure;Hypertension;Stress Weight Management/Obesity;Heart Failure;Hypertension;Stress    Review Krisite started intensive cardiac rehab on 01/28/23 and did well with exercise. Vital signs were stable. Systolic BP between 90-120. Krisitie did report some right knee pain Krisite started intensive cardiac rehab on 01/28/23 and is off to a good start  with exercise. Obtained parameters to exercise for systolic BP greater than 90. Blood pressures are improved since aldactdone dose has been decreased  Verna Czech has good participation in  intensive cardiac rehab when in attendance. Anegla has lost 1.6 kg since starting cardiac rehab. Verna Czech has not participated in cardiac rehab since 02/20/23 Verna Czech has not returned to exercise since 02/20/23. Will discharge due to nonattendance.    Expected Outcomes Krisitie will continue to participate in intensive cardiac rehab for exercise, nutrition and lifestyle modifications Krisitie will continue to participate in intensive cardiac rehab for exercise, nutrition and lifestyle modifications Krisitie will continue to participate in intensive cardiac rehab for exercise, nutrition and lifestyle modifications Hopefully Verna Czech will continue  exercise, nutrition and lifestyle on her own              Exercise Goals and Review:   Exercise Goals Re-Evaluation:  Exercise Goals Re-Evaluation     Row Name 01/28/23 1628 02/11/23 1625           Exercise Goal Re-Evaluation   Exercise Goals Review Increase Physical Activity;Understanding of Exercise Prescription;Increase Strength and Stamina;Knowledge and understanding of Target Heart Rate Range (THRR);Able to understand and use rate of perceived exertion (RPE) scale Increase Physical Activity;Understanding of Exercise Prescription;Increase Strength and Stamina;Knowledge and understanding of Target Heart Rate Range (THRR);Able to understand and use rate of perceived exertion (RPE) scale      Comments Pt first day in the [facility] I[facility] program. Pt tolerated exercise well with an average MET level of 1.85. Pt is learning her THRR, RPE and ExRx. She's off to a good start REVD MET's and goals. Evaluated home ExRx, but patient states exercise here in enough right now and she is still challenging herself with her stairs at home. Talked about reevaluating home exrx to achieve 5-7 of exercise to meet [facility] standards further along as she gains strength. Pt tolerated exercise well with an average MET level of 2.45. Pt feels good about her goals so far and is making progress      Expected Outcomes Will continue to monitor pt and progress workloads as tolerated without sign or symptom Will continue to monitor pt and progress workloads as tolerated without sign or symptom               Nutrition & Weight - Outcomes:    Nutrition:  Nutrition Therapy & Goals - 02/27/23 1342       Nutrition Therapy   Diet Heart Healthy/Bariatric Diet      Personal Nutrition Goals   Nutrition Goal Patient to identify strategies for reducing cardiovascular risk by attending the [facility] education and nutrition series weekly.   goal in progress.   Personal Goal #2 Patient to improve diet quality by using the plate method as a guide for meal planning to include  lean protein/plant protein, fruits, vegetables, whole grains, nonfat dairy as part of a well-balanced diet.   goal in progress.   Personal Goal #3 Patient to limit sodium to 1500mg  per day   goal in progress.   Personal Goal #4 Patient to begin bariatric supplements daily.   goal in progress.   Comments Goals in progress. Zee continues to attend the [employer] and nutrition series regularly. Kamilla is s/p duodenal switch single anastamosis on 06/06/2022 at 316#; she is down 94# in ~72months (28% body weight). She is down 3.5# since starting with our program. We have discussed the importance of bariatric supplementation and gave resources.  She continues one protein shake daily (Premier, 30g protein). Patient will benefit from participation in intensive cardiac rehab for nutrition, exercise, and lifestyle modification.  Intervention Plan   Intervention Prescribe, educate and counsel regarding individualized specific dietary modifications aiming towards targeted core components such as weight, hypertension, lipid management, diabetes, heart failure and other comorbidities.;Nutrition handout(s) given to patient.    Expected Outcomes Short Term Goal: Understand basic principles of dietary content, such as calories, fat, sodium, cholesterol and nutrients.;Long Term Goal: Adherence to prescribed nutrition plan.;Short Term Goal: A plan has been developed with personal nutrition goals set during dietitian appointment.             Nutrition Discharge:   Education Questionnaire Score:   Krisitie completed 15 exercise and education sessions between 01/24/23-02/20/23 . Jeannean progressed nicely during their participation in rehab as evidenced by increased MET level when in participation. Krisitie was discharged due to nonattendance.Thayer Headings RN BSN

## 2023-03-29 ENCOUNTER — Encounter (HOSPITAL_COMMUNITY): Payer: BC Managed Care – PPO

## 2023-04-01 ENCOUNTER — Encounter (HOSPITAL_COMMUNITY): Payer: BC Managed Care – PPO

## 2023-04-03 ENCOUNTER — Encounter (HOSPITAL_COMMUNITY): Payer: BC Managed Care – PPO

## 2023-04-04 NOTE — Progress Notes (Signed)
Patient ID: SOYLA SPIRK                 DOB: February 25, 1971                    MRN: [id number]      HPI: Karen Lutz is a 52 y.o. female patient referred to pharmacy clinic by Dr. Gasper Lloyd to initiate weight loss therapy with GLP1-RA. PMH is significant for obesity s/p bariatric surgery Nov 2023 and CHF. Her cardiac history dates back to November 01, 2022 when she was found unresponsive by her boyfriend. Her rhythm on arrival was ventricular fibrillation with an unknown downtime. She underwent CPR for 23 minutes before ROSC. On arrival she was found to be hypokalemic with a QTc of 511 ms with normalization after correction of hypokalemia. She was intubated and placed on pressors with echocardiogram demonstrating EF of 25%, cardiac MRI with a EF of 22% and preserved RV function. She had a Biotronik secondary prevention ICD placed was started on GDMT and discharged home 1 week later.   Most recent BMI 36.14. Spoke with Melissa, GLP-1 was denied by insurance for weight loss indication. Pt reported insurance said they cover [facility] for cardiovascular risk reduction, but she does not have ASCVD. Submitted appeal for insurance denial which was denied.  Patient reports to clinic today for weight management. Communicated with her that insurance appeal for Reginal Lutes was denied. Discussed coupon for Zepbound with no insurance coverage is $550 per month which is unaffordable for her. She reports barely being able to eat 2 meals/day since bariatric surgery last year. Has lost ~120 lbs. Will eat a couple bites of a sandwich and a few pieces of fruit for a meal. She works two jobs and has had a lot of stress this past year with cardiac arrest in March and husband released from prison a few weeks ago. She was tearful in the room discussing her stress and events from the past year. Asked if she had ever talked to a counselor in the past which she has, but not recently.  Current weight management medications:  none  Previously tried meds: none  Current meds that may affect weight: none  Baseline weight/BMI: 36.14  Insurance payor: [employer]  Diet:  Barely eating 2 meals/day -Breakfast (7-8 AM): biscuit, piece of chicken or sausage, something small -Dinner (4 PM): takeout, 1/2 of a chickfila salad -Snacks: fruit -Drinks: water, protein shakes  Exercise: up and down stairs all day at work, just finished cardiac rehab, no dedicated physical activity since then  Family History:  Mother- alcohol abuse, heart disease Father- alcohol abuse, heart disease  Social History: no tobacco, no alcohol  Labs: Lab Results  Component Value Date   HGBA1C 5.1 11/02/2022    Wt Readings from Last 1 Encounters:  03/11/23 216 lb (98 kg)    BP Readings from Last 1 Encounters:  03/11/23 101/64   Pulse Readings from Last 1 Encounters:  03/11/23 64       Component Value Date/Time   CHOL 124 02/07/2011 0345   TRIG 82 02/07/2011 0345   HDL 42 02/07/2011 0345   CHOLHDL 3.0 02/07/2011 0345   VLDL 16 02/07/2011 0345   LDLCALC 66 02/07/2011 0345    Past Medical History:  Diagnosis Date   Acid reflux    Anemia    Fibroids    Heart murmur    Knee pain    UTI (lower urinary tract infection)     Current  Outpatient Medications on File Prior to Visit  Medication Sig Dispense Refill   aspirin 81 MG chewable tablet Chew 1 tablet (81 mg total) by mouth daily. 30 tablet 3   carvedilol (COREG) 6.25 MG tablet Take 1 tablet (6.25 mg total) by mouth 2 (two) times daily with a meal. 60 tablet 6   dapagliflozin propanediol (FARXIGA) 10 MG TABS tablet Take 1 tablet (10 mg total) by mouth daily. 30 tablet 3   furosemide (LASIX) 40 MG tablet TAKE 1 TABLET(40 MG) BY MOUTH DAILY 30 tablet 3   lidocaine (XYLOCAINE) 5 % ointment Apply 1 Application topically 2 (two) times daily as needed. 30 g 0   omeprazole (PRILOSEC OTC) 20 MG tablet Take 20 mg by mouth daily. Check with Dr about getting prescription strength  omperazole     sacubitril-valsartan (ENTRESTO) 49-51 MG Take 1 tablet by mouth 2 (two) times daily. 60 tablet 3   spironolactone (ALDACTONE) 25 MG tablet Take 1 tablet (25 mg total) by mouth at bedtime. Please cancel all previous orders for current medication. Change in dosage or pill size. 30 tablet 6   [DISCONTINUED] albuterol (VENTOLIN HFA) 108 (90 Base) MCG/ACT inhaler Inhale 2 puffs into the lungs every 4 (four) hours as needed for wheezing or shortness of breath. (Patient not taking: Reported on 10/30/2020) 18 g 0   No current facility-administered medications on file prior to visit.    Allergies  Allergen Reactions   Penicillins Rash    Has patient had a PCN reaction causing immediate rash, facial/tongue/throat swelling, SOB or lightheadedness with hypotension: unknown Has patient had a PCN reaction causing severe rash involving mucus membranes or skin necrosis: unknown Has patient had a PCN reaction that required hospitalization : yes Has patient had a PCN reaction occurring within the last 10 years: no If all of the above answers are "NO", then may proceed with Cephalosporin use.    Penicillin G Other (See Comments)    Per patient, childhood allergy, reaction unknown.       Assessment/Plan:  1. Weight loss - Patient is not a good candidate for GLP-1 given already low appetite s/p bariatric surgery last year as well as lack of coverage with her insurance. Discussed diet and lifestyle today, recommended trying to incorporate exercise into weekly routine. Can start small and increase to target 150 minutes per week, she wants to try water aerobics. Discussed significant stress from the past year and recommended reaching out to counselor she spoke with before as she found this helpful in the past.  Adam Phenix, PharmD PGY-1 Pharmacy Resident  Megan E. Supple, PharmD, BCACP, CPP [facility] [address] [place], [place] [address] Phone: [phone]; Fax: [phone] 04/05/2023 12:39 PM

## 2023-04-05 ENCOUNTER — Ambulatory Visit: Payer: BC Managed Care – PPO | Attending: Anesthesiology | Admitting: Pharmacist

## 2023-04-05 ENCOUNTER — Encounter (HOSPITAL_COMMUNITY): Payer: BC Managed Care – PPO

## 2023-04-05 VITALS — Wt 210.8 lb

## 2023-04-05 DIAGNOSIS — E669 Obesity, unspecified: Secondary | ICD-10-CM | POA: Diagnosis not present

## 2023-04-08 ENCOUNTER — Other Ambulatory Visit (HOSPITAL_COMMUNITY): Payer: Self-pay | Admitting: Cardiology

## 2023-04-18 ENCOUNTER — Encounter: Payer: Self-pay | Admitting: General Practice

## 2023-04-29 ENCOUNTER — Other Ambulatory Visit (HOSPITAL_COMMUNITY): Payer: Self-pay | Admitting: Cardiology

## 2023-05-13 ENCOUNTER — Ambulatory Visit (INDEPENDENT_AMBULATORY_CARE_PROVIDER_SITE_OTHER): Payer: 59

## 2023-05-13 DIAGNOSIS — I469 Cardiac arrest, cause unspecified: Secondary | ICD-10-CM

## 2023-05-13 DIAGNOSIS — I4901 Ventricular fibrillation: Secondary | ICD-10-CM

## 2023-05-13 LAB — CUP PACEART REMOTE DEVICE CHECK
Date Time Interrogation Session: 20241007125450
Implantable Lead Connection Status: 753985
Implantable Lead Implant Date: 20240405
Implantable Lead Location: 753860
Implantable Lead Model: 436909
Implantable Lead Serial Number: 81561050
Implantable Pulse Generator Implant Date: 20240405
Pulse Gen Model: 429525
Pulse Gen Serial Number: 84953112

## 2023-05-14 ENCOUNTER — Ambulatory Visit: Payer: BC Managed Care – PPO | Attending: Genetic Counselor | Admitting: Genetic Counselor

## 2023-05-17 NOTE — Progress Notes (Signed)
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 African American woman who reports undergoing weight loss surgery on Jul 02, 2022 and has since lost 130 lbs. In march of this year, her friend found her non-responsive in bed at her home. She reports having no recollection of events past that time and during her stay in the hospital. She was told of having CHF and an ICD was implanted soon after. She reports having swollen ankles for years but attributed that to being overweight.  Upon discharge, her blood sample was sent out for genetic testing.  She reports having fatigue and attributed that to her anemia. Also reports feeling occasional lightheadedness and denies having syncope.  Traditional Risk Factors Daaiyah denies having myocarditis, ischemic heart disease, infiltrative myocardial disease (amyloidosis, sarcoidosis, hemochromatosis), infection with HIV virus, HTN, connective tissue disease (such as systemic lupus erythematosus etc.), doxorubicin therapy and heart valvular disease.  She does report drug abuse of crack cocaine for nearly 10 years and says that she has been clean since 2013.   Family history  Relation to Proband Pedigree # Current age Heart condition/age of onset Notes  Daughters, 3 IV.10-IV.12 31, 30, 25 None Echo/EKG to be done  Grandchildren V.1, V.2 4, 1 None   Sisters, 3 III.1, III.2, III.4 III.1-Deceased 53, 47 None III.1- Epilepsy after being hit by car while crossing street, died of seizures @ 51 III.4- syncope- 5x- no F/U  Nieces, nephews  IV.1-IV.9 IV.13, IV.14 36-11 None         Father II.10 Deceased M.I, CABG @ 43 Died OF RENAL AND HEART  FAILURE @ 54s   Paternal uncles, aunts II.1- II.9 All deceased, except II.9- 55s Unaware Died of ? @ ?  Paternal grandfather 1.1 Deceased Unaware Died of ? @ ?  Paternal grandmother I.2 Deceased Unaware Died of ? @ ?        Mother II.11 69 None   Maternal aunts, uncle II.12- II.15 All deceased, except II.14-70s  None II.7- Died @ 68-dementia (fraternal twin of pt's mother) II.15- Died @ 70s-poor health  Maternal grandfather I.3 Deceased None Died of old age @ 44s  Maternal grandmother I.4 Deceased Unaware Died of gangrene @ ?   Genetic Consult I reviewed the different genetic cardiomyopathies that are considered as non-ischemic cardiomyopathy, namely ARVC (DCM, HCM and LVNC. I discussed the genetics of HCM, DCM, LVNC and ACM, namely inheritance, incomplete penetrance, variable expression and digenic/compound mutations that can be seen in some patients.   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 This note also includes the re-interpretation of his genetic test result.  The variants were re-interpreted to verify the test report interpretation and to compile the information in light of the patient's clinical presentation and family history  JUP gene encodes junction plakogobin, an important cytoskeletal protein of the catenin family that is an integral part of cell junction complexes such as desmosomes. Mutations in this gene are observed in patients with ARVC, Naxos disease and other conditions that affect the skin and hair such as keratoderma with wooly hair and lethal congenital epidermolysis bullosa.  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 Additionally, this variant has been reported in patients with BrS and ARVC  where the ARVC patient also harbored a pathogenic splice variant in PKP2 gene (c.2146-1G>C).   Taken together, the non-specificity of clinical presentation in patients with this variant and the molecular data above makes it difficult to classify this as a pathogenic variant. Additional functional data or family members with NICM to demonstrate segregation with disease is needed to confirm the pathogenicity of this variant. 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 Nevertheless, it would be prudent to have her children undergo clinical surveillance for cardiomyopathy. In the future with increasing knowledgebase, if the JUP variant is reclassified as a pathogenic/benign variant, then we can revisit the appropriate follow up for the family at that time.   Patient should also be aware of the protections afforded by Genetic Information Non-Discrimination Act (GINA). GINA protects them from losing their employment or health insurance based on their genotype. However, these protections do not cover life insurance and disability     Sidney Ace, Ph.D, [facility] Clinical Molecular Geneticist

## 2023-05-22 ENCOUNTER — Emergency Department (HOSPITAL_COMMUNITY)
Admission: EM | Admit: 2023-05-22 | Discharge: 2023-05-22 | Disposition: A | Discharge status: Home / Self Care | Payer: 59 | Attending: Emergency Medicine | Admitting: Emergency Medicine

## 2023-05-22 ENCOUNTER — Other Ambulatory Visit: Payer: Self-pay

## 2023-05-22 ENCOUNTER — Encounter (HOSPITAL_COMMUNITY): Payer: Self-pay | Admitting: Emergency Medicine

## 2023-05-22 DIAGNOSIS — K648 Other hemorrhoids: Secondary | ICD-10-CM | POA: Diagnosis not present

## 2023-05-22 DIAGNOSIS — I509 Heart failure, unspecified: Secondary | ICD-10-CM | POA: Diagnosis not present

## 2023-05-22 DIAGNOSIS — Z7982 Long term (current) use of aspirin: Secondary | ICD-10-CM | POA: Diagnosis not present

## 2023-05-22 DIAGNOSIS — K6289 Other specified diseases of anus and rectum: Secondary | ICD-10-CM | POA: Diagnosis present

## 2023-05-22 DIAGNOSIS — K649 Unspecified hemorrhoids: Secondary | ICD-10-CM

## 2023-05-22 MED ORDER — HYDROCORTISONE ACETATE 25 MG RE SUPP: 25.0000 mg | Freq: Two times a day (BID) | RECTAL | 0 refills | Status: DC

## 2023-05-22 NOTE — ED Provider Notes (Signed)
[place] EMERGENCY DEPARTMENT AT [facility] Provider Note   CSN: [id number] Arrival date & time: 05/22/23  0100     History  Chief Complaint  Patient presents with   Rectal Pain    Karen Lutz is a 52 y.o. female.  HPI Patient presents for rectal pain.  Medical history includes anemia, uterine fibroids, GERD, migraines, CHF, prediabetes, bariatric surgery.  She had cardiopulmonary arrest in March of this year.  Since May, she has had rectal discomfort.  Discomfort is present with or without bowel movements.  She will occasionally have blood on toilet paper.  She states that she has had regular daily stools.  Stools have been soft/liquid.  She has been using Preparation H suppositories with minimal relief.  She denies any other recent symptoms.    Home Medications Prior to Admission medications   Medication Sig Start Date End Date Taking? Authorizing Provider  hydrocortisone (ANUSOL-HC) 25 MG suppository Place 1 suppository (25 mg total) rectally 2 (two) times daily. 05/22/23  Yes Gloris Manchester, MD  aspirin 81 MG chewable tablet Chew 1 tablet (81 mg total) by mouth daily. 11/10/22   Arrien, York Ram, MD  carvedilol (COREG) 6.25 MG tablet Take 1 tablet (6.25 mg total) by mouth 2 (two) times daily with a meal. 01/14/23   Sabharwal, Aditya, DO  dapagliflozin propanediol (FARXIGA) 10 MG TABS tablet TAKE 1 TABLET(10 MG) BY MOUTH DAILY 04/30/23   Sabharwal, Aditya, DO  ENTRESTO 49-51 MG TAKE 1 TABLET BY MOUTH TWICE DAILY 04/11/23   Sabharwal, Aditya, DO  furosemide (LASIX) 40 MG tablet TAKE 1 TABLET(40 MG) BY MOUTH DAILY 03/25/23   Sabharwal, Aditya, DO  lidocaine (XYLOCAINE) 5 % ointment Apply 1 Application topically 2 (two) times daily as needed. 03/11/23   Gailen Shelter, PA  omeprazole (PRILOSEC OTC) 20 MG tablet Take 20 mg by mouth daily. Check with Dr about getting prescription strength omperazole    [provider]  spironolactone (ALDACTONE) 25 MG tablet  Take 1 tablet (25 mg total) by mouth at bedtime. Please cancel all previous orders for current medication. Change in dosage or pill size. 02/22/23   Sabharwal, Aditya, DO  albuterol (VENTOLIN HFA) 108 (90 Base) MCG/ACT inhaler Inhale 2 puffs into the lungs every 4 (four) hours as needed for wheezing or shortness of breath. Patient not taking: Reported on 10/30/2020 09/08/20 11/24/20  Valinda Hoar, NP      Allergies    Penicillins and Penicillin g    Review of Systems   Review of Systems  Gastrointestinal:  Positive for rectal pain.  All other systems reviewed and are negative.   Physical Exam Updated Vital Signs BP 112/71   Pulse 76   Temp 97.6 F (36.4 C) (Oral)   Resp 18   Ht 5\' 6"  (1.676 m)   Wt 92.5 kg   LMP  (LMP Unknown)   SpO2 100%   BMI 32.93 kg/m  Physical Exam Vitals and nursing note reviewed. Exam conducted with a chaperone present.  Constitutional:      General: She is not in acute distress.    Appearance: Normal appearance. She is well-developed. She is not ill-appearing, toxic-appearing or diaphoretic.  HENT:     Head: Normocephalic and atraumatic.     Right Ear: External ear normal.     Left Ear: External ear normal.     Nose: Nose normal.     Mouth/Throat:     Mouth: Mucous membranes are moist.  Eyes:  Extraocular Movements: Extraocular movements intact.     Conjunctiva/sclera: Conjunctivae normal.  Cardiovascular:     Rate and Rhythm: Normal rate and regular rhythm.  Pulmonary:     Effort: Pulmonary effort is normal. No respiratory distress.  Abdominal:     General: There is no distension.     Palpations: Abdomen is soft.  Genitourinary:    Rectum: Tenderness and internal hemorrhoid present. No anal fissure.  Musculoskeletal:        General: No swelling or deformity. Normal range of motion.     Cervical back: Normal range of motion and neck supple.  Skin:    General: Skin is warm and dry.  Neurological:     General: No focal deficit  present.     Mental Status: She is alert and oriented to person, place, and time.  Psychiatric:        Mood and Affect: Mood normal.        Behavior: Behavior normal.     ED Results / Procedures / Treatments   Labs (all labs ordered are listed, but only abnormal results are displayed) Labs Reviewed - No data to display  EKG None  Radiology No results found.  Procedures Procedures    Medications Ordered in ED Medications - No data to display  ED Course/ Medical Decision Making/ A&P                                 Medical Decision Making  Patient presents for rectal pain that has been ongoing for the past several months.  Vital signs on arrival in the ED are normal.  Patient is well-appearing on exam.  She denies any abdominal pain or any systemic symptoms.  Rectal exam was performed with nurse chaperone present.  Although no hemorrhoids or fissures were visible on external examination, there were multiple tender hemorrhoids present on DRE.  There was no blood or melena.  There was no evidence of abscess.  Patient has recently been trying Preparation H suppositories.  She was counseled on the use of fiber supplement and sitz bath's.  She was prescribed hydrocortisone suppositories.  She was advised to follow-up with surgery as needed if she does have persistent symptoms.  She was discharged in stable condition.        Final Clinical Impression(s) / ED Diagnoses Final diagnoses:  Hemorrhoids without complication    Rx / DC Orders ED Discharge Orders          Ordered    hydrocortisone (ANUSOL-HC) 25 MG suppository  2 times daily        05/22/23 0129              Gloris Manchester, MD 05/22/23 0130

## 2023-05-22 NOTE — ED Triage Notes (Signed)
Pt c/o rectal pain x 2 months.

## 2023-05-22 NOTE — Discharge Instructions (Addendum)
A prescription for a steroid suppository was sent to your pharmacy.  Take this twice a day.  You can continue Preparation H suppositories as well.  You should take a daily fiber supplement such as Metamucil.  Utilize sitz bath's several times per day, including before and after bowel movements.  If you do have persistent symptoms, call the telephone number below for a surgery follow-up appointment.  Return to the emergency department for any new or worsening symptoms of concern.

## 2023-05-23 ENCOUNTER — Encounter (HOSPITAL_COMMUNITY): Payer: BC Managed Care – PPO | Admitting: Cardiology

## 2023-05-24 NOTE — Progress Notes (Signed)
Remote ICD transmission.   

## 2023-06-10 ENCOUNTER — Encounter (HOSPITAL_COMMUNITY): Payer: Self-pay | Admitting: Cardiology

## 2023-06-10 ENCOUNTER — Other Ambulatory Visit (HOSPITAL_COMMUNITY): Payer: Self-pay | Admitting: Cardiology

## 2023-06-10 ENCOUNTER — Ambulatory Visit (HOSPITAL_COMMUNITY)
Admission: RE | Admit: 2023-06-10 | Discharge: 2023-06-10 | Disposition: A | Discharge status: Home / Self Care | Payer: 59 | Source: Ambulatory Visit | Attending: Cardiology | Admitting: Cardiology

## 2023-06-10 VITALS — BP 106/68 | HR 80 | Wt 204.6 lb

## 2023-06-10 DIAGNOSIS — I5042 Chronic combined systolic (congestive) and diastolic (congestive) heart failure: Secondary | ICD-10-CM | POA: Diagnosis present

## 2023-06-10 DIAGNOSIS — Z6833 Body mass index (BMI) 33.0-33.9, adult: Secondary | ICD-10-CM | POA: Insufficient documentation

## 2023-06-10 DIAGNOSIS — Z79899 Other long term (current) drug therapy: Secondary | ICD-10-CM | POA: Diagnosis not present

## 2023-06-10 DIAGNOSIS — I4901 Ventricular fibrillation: Secondary | ICD-10-CM | POA: Insufficient documentation

## 2023-06-10 DIAGNOSIS — B3749 Other urogenital candidiasis: Secondary | ICD-10-CM

## 2023-06-10 DIAGNOSIS — Z8249 Family history of ischemic heart disease and other diseases of the circulatory system: Secondary | ICD-10-CM | POA: Diagnosis not present

## 2023-06-10 DIAGNOSIS — Z95810 Presence of automatic (implantable) cardiac defibrillator: Secondary | ICD-10-CM | POA: Diagnosis not present

## 2023-06-10 DIAGNOSIS — Z7984 Long term (current) use of oral hypoglycemic drugs: Secondary | ICD-10-CM | POA: Diagnosis not present

## 2023-06-10 DIAGNOSIS — I428 Other cardiomyopathies: Secondary | ICD-10-CM | POA: Insufficient documentation

## 2023-06-10 DIAGNOSIS — E669 Obesity, unspecified: Secondary | ICD-10-CM | POA: Insufficient documentation

## 2023-06-10 LAB — BASIC METABOLIC PANEL
Anion gap: 10 (ref 5–15)
BUN: 7 mg/dL (ref 6–20)
CO2: 26 mmol/L (ref 22–32)
Calcium: 10.1 mg/dL (ref 8.9–10.3)
Chloride: 105 mmol/L (ref 98–111)
Creatinine, Ser: 0.87 mg/dL (ref 0.44–1.00)
GFR, Estimated: >60 mL/min (ref >60)
Glucose, Bld: 89 mg/dL (ref 70–99)
Potassium: 3.7 mmol/L (ref 3.5–5.1)
Sodium: 141 mmol/L (ref 135–145)

## 2023-06-10 LAB — BRAIN NATRIURETIC PEPTIDE: B Natriuretic Peptide: 93.3 pg/mL (ref 0.0–100.0)

## 2023-06-10 MED ORDER — FLUCONAZOLE 150 MG PO TABS: 150.0000 mg | ORAL_TABLET | Freq: Once | ORAL | 0 refills | Status: AC

## 2023-06-10 MED ORDER — DAPAGLIFLOZIN PROPANEDIOL 10 MG PO TABS: 10.0000 mg | ORAL_TABLET | Freq: Every day | ORAL | 11 refills | Status: DC

## 2023-06-10 MED ORDER — CARVEDILOL 12.5 MG PO TABS: 12.5000 mg | ORAL_TABLET | Freq: Two times a day (BID) | ORAL | 3 refills | Status: DC

## 2023-06-10 NOTE — Progress Notes (Addendum)
ADVANCED HEART FAILURE CLINIC NOTE  Referring Physician: Verlee Rossetti, PA-C  Primary Care: Patient, No Pcp Per Primary Cardiologist: Dr. Gasper Lloyd  HPI: Karen Lutz is a 52 y.o. female with history of heart failure with reduced action fraction, obesity status post bariatric surgery in November 2023 at [facility] and a significant family history of early systolic heart failure that presents today to establish care.  Her cardiac history dates back to November 01, 2022 when she was found unresponsive by her boyfriend.  Her rhythm on arrival was ventricular fibrillation with an unknown downtime.  She underwent CPR for 23 minutes before ROSC.  On arrival she was found to be hypokalemic with a QTc of 511 ms with normalization after correction of hypokalemia.  She was intubated and placed on pressors with echocardiogram demonstrating EF of 25%, cardiac MRI with a EF of 22% and preserved RV function.  She had a Biotronik secondary prevention ICD placed was started on GDMT and discharged home 1 week later.   Interval history -From a functional standpoint she continues to do tremendously well.  She is active and working.  She continues to lose weight.  Her BMI is now down to 33 kg/m.  She reports that her dyspnea has nearly resolved. -She has now seen Dr. Doristine Locks for genetics; screening echocardiogram is recommended for her family.  Activity level/exercise tolerance: NYHA IIb Orthopnea:  Sleeps on 2 pillows Paroxysmal noctural dyspnea:  no Chest pain/pressure:  no Orthostatic lightheadedness:  no Palpitations:  no Lower extremity edema:  no Presyncope/syncope:  no Cough:  no  Past Medical History:  Diagnosis Date   Acid reflux    Anemia    Fibroids    Heart murmur    Knee pain    UTI (lower urinary tract infection)     Current Outpatient Medications  Medication Sig Dispense Refill   aspirin 81 MG chewable tablet Chew 1 tablet (81 mg total) by mouth daily. 30 tablet  3   fluconazole (DIFLUCAN) 150 MG tablet Take 1 tablet (150 mg total) by mouth once for 1 dose. 1 tablet 0   carvedilol (COREG) 12.5 MG tablet Take 1 tablet (12.5 mg total) by mouth 2 (two) times daily with a meal. 180 tablet 3   dapagliflozin propanediol (FARXIGA) 10 MG TABS tablet Take 1 tablet (10 mg total) by mouth daily. 30 tablet 11   ENTRESTO 49-51 MG TAKE 1 TABLET BY MOUTH TWICE DAILY 60 tablet 3   furosemide (LASIX) 40 MG tablet TAKE 1 TABLET(40 MG) BY MOUTH DAILY 30 tablet 3   hydrocortisone (ANUSOL-HC) 25 MG suppository Place 1 suppository (25 mg total) rectally 2 (two) times daily. 12 suppository 0   lidocaine (XYLOCAINE) 5 % ointment Apply 1 Application topically 2 (two) times daily as needed. 30 g 0   omeprazole (PRILOSEC OTC) 20 MG tablet Take 20 mg by mouth daily. Check with Dr about getting prescription strength omperazole     spironolactone (ALDACTONE) 25 MG tablet Take 1 tablet (25 mg total) by mouth at bedtime. Please cancel all previous orders for current medication. Change in dosage or pill size. 30 tablet 6   No current facility-administered medications for this encounter.    Allergies  Allergen Reactions   Penicillins Rash    Has patient had a PCN reaction causing immediate rash, facial/tongue/throat swelling, SOB or lightheadedness with hypotension: unknown Has patient had a PCN reaction causing severe rash involving mucus membranes or skin necrosis: unknown Has patient  had a PCN reaction that required hospitalization : yes Has patient had a PCN reaction occurring within the last 10 years: no If all of the above answers are "NO", then may proceed with Cephalosporin use.    Penicillin G Other (See Comments)    Per patient, childhood allergy, reaction unknown.        Social History   Socioeconomic History   Marital status: Legally Separated    Spouse name: Not on file   Number of children: Not on file   Years of education: Not on file   Highest education  level: Not on file  Occupational History   Not on file  Tobacco Use   Smoking status: Never   Smokeless tobacco: Never  Vaping Use   Vaping status: Never Used  Substance and Sexual Activity   Alcohol use: No   Drug use: Never   Sexual activity: Yes    Birth control/protection: None  Other Topics Concern   Not on file  Social History Narrative   Not on file   Social Determinants of Health   Financial Resource Strain: Low Risk  (11/02/2022)   Overall Financial Resource Strain (CARDIA)    Difficulty of Paying Living Expenses: Not hard at all  Food Insecurity: Low Risk  (03/12/2023)   Received from [facility]   Hunger Vital Sign    Worried About Running Out of Food in the Last Year: Never true    Ran Out of Food in the Last Year: Never true  Transportation Needs: Not on file (03/12/2023)  Physical Activity: Not on file  Stress: Not on file  Social Connections: Unknown (12/13/2021)   Received from [facility]   Social Network    Social Network: Not on file  Intimate Partner Violence: Unknown (11/10/2021)   Received from [facility], [facility]   HITS    Physically Hurt: Not on file    Insult or Talk Down To: Not on file    Threaten Physical Harm: Not on file    Scream or Curse: Not on file      Family History  Problem Relation Age of Onset   Hypertension Other    Cancer Other    Alcohol abuse Mother    Heart disease Mother    Alcohol abuse Father    Heart disease Father    Arthritis Maternal Grandmother    Arthritis Maternal Grandfather    Arthritis Paternal Grandmother    Arthritis Paternal Grandfather     PHYSICAL EXAM: Vitals:   06/10/23 1119  BP: 106/68  Pulse: 80  SpO2: 99%   GENERAL: Well nourished, well developed, and in no apparent distress at rest.  HEENT: Negative for arcus senilis or xanthelasma. There is no scleral icterus.  The mucous membranes are pink and moist.   NECK: Supple, No masses. Normal carotid upstrokes without  bruits. No masses or thyromegaly.    CHEST: There are no chest wall deformities. There is no chest wall tenderness. Respirations are unlabored.  Lungs-CTA bilaterally CARDIAC:  JVP: 7 cm          Normal rate with regular rhythm. No murmurs, rubs or gallops.  Pulses are 2+ and symmetrical in upper and lower extremities.  No edema.  ABDOMEN: Soft, non-tender, non-distended. There are no masses or hepatomegaly. There are normal bowel sounds.  EXTREMITIES: Warm and well perfused with no cyanosis, clubbing.  LYMPHATIC: No axillary or supraclavicular lymphadenopathy.  NEUROLOGIC: Patient is oriented x3 with no focal or  lateralizing neurologic deficits.  PSYCH: Patients affect is appropriate, there is no evidence of anxiety or depression.  SKIN: Warm and dry; no lesions or wounds.      DATA REVIEW  ECG: 18/24: Normal sinus rhythm as per my personal interpretation  ECHO: 10/12/22: LVEF 25%, grade 2 diastolic dysfunction, normal RV function as per my personal interpretation 02/22/23: LVEF 25%-30%, normal RV function.   CATH: 1.Normal coronaries 2. Elevated filling pressures.  3. Mild pulmonary venous hypertension.  4. Preserved cardiac output.  1 point  CMR: 11/07/22: 1.  Moderately dilated LV with global hypokinesis, EF 22%.  2.  Normal RV size with EF 50%.  3. No myocardial LGE, so no definitive evidence for prior MI, myocarditis, or infiltrative disease.  ASSESSMENT & PLAN:  Heart failure with reduced ejection fraction Etiology of HF: Nonischemic cardiomyopathy with cardiac MRI not demonstrating any infiltrative cardiomyopathy or LGE.  I spoke with her family while inpatient and they commented on significant family history of heart failure and multiple family members before the age of 29-60.  They may possibly have 1 family member with sudden cardiac arrest.  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  Seen by Dr. Doristine Locks; recommended clinical screening for  children.  NYHA class / AHA Stage: NYHA II Volume status & Diuretics: Euvolemic continue Lasix 40 mg daily Vasodilators: Entresto 49/51 mg twice daily Beta-Blocker: increase coreg to 12.5mg  BID.  MRA: Increase spironolactone to 25mg  daily.  Repeat lab work today. Cardiometabolic: Farxiga 10 mg Devices therapies & Valvulopathies: Secondary to an ICD in place Advanced therapies: Continue GDMT; currently NYHA II.  Will plan on repeat echocardiogram in 3 months.  Will repeat lab work today.  Possible CPX if LVEF does not improve.  2.  V-fib arrest -No etiology determined at this point.  EP following.  Hypokalemic on arrival during her arrest.   -JUP gene positive; associated with ARVD.  Screening recommended for family members  3.  Obesity -BMI 33, down from 38. 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   I spent 45 minutes caring for this patient today including face to face time, ordering and reviewing labs, reviewing records from genetics (Dr. Doristine Locks), seeing the patient, documenting in the record, and arranging follow ups. We also discussed screening for her family members. We can likely arrange this through their PCPs with echocardiograms to assess for any phenotype + disease.   Dannette Kinkaid Advanced Heart Failure Mechanical Circulatory Support

## 2023-06-10 NOTE — Patient Instructions (Signed)
INCREASE Carvedilol to 12.5 mg Twice daily  Take fluconazole for one dose only.  Labs done today, your results will be available in MyChart, we will contact you for abnormal readings.  Your physician recommends that you schedule a follow-up appointment in: 3 months (February 2025) ** PLEASE CALL THE OFFICE IN DECEMBER TO ARRANGE YOUR FOLLOW UP APPOINTMENT. **  If you have any questions or concerns before your next appointment please send [place] a message through [place] or call our office at [phone].    TO LEAVE A MESSAGE FOR THE NURSE SELECT OPTION 2, PLEASE LEAVE A MESSAGE INCLUDING: YOUR NAME DATE OF BIRTH CALL BACK NUMBER REASON FOR CALL**this is important as we prioritize the call backs  YOU WILL RECEIVE A CALL BACK THE SAME DAY AS LONG AS YOU CALL BEFORE 4:00 PM  At the [facility], you and your health needs are our priority. As part of our continuing mission to provide you with exceptional heart care, we have created designated Provider Care Teams. These Care Teams include your primary Cardiologist (physician) and Advanced Practice Providers (APPs- Physician Assistants and Nurse Practitioners) who all work together to provide you with the care you need, when you need it.   You may see any of the following providers on your designated Care Team at your next follow up: Dr Arvilla Meres Dr Marca Ancona Dr. Dorthula Nettles Dr. Clearnce Hasten Amy Filbert Schilder, NP Robbie Lis, [place] [facility] [place], [place] Brynda Peon, NP [place] Lee, NP Karle Plumber, PharmD   Please be sure to bring in all your medications bottles to every appointment.    Thank you for choosing [facility]

## 2023-06-11 ENCOUNTER — Other Ambulatory Visit (HOSPITAL_COMMUNITY): Payer: Self-pay

## 2023-06-11 NOTE — Telephone Encounter (Signed)
Pa in process per pharmacy team  Will give samples while awaiting answer  Medication Samples have been provided to the patient.  Drug name: farxiga       Strength: 10 mg        Qty: 7  LOT: [id number]    Exp.Date:  11/03/2025 Dosing instructions: one tab daily  The patient has been instructed regarding the correct time, dose, and frequency of taking this medication, including desired effects and most common side effects.   Magda Bernheim M 1:25 PM 06/11/2023

## 2023-06-13 ENCOUNTER — Ambulatory Visit: Payer: 59 | Admitting: Family Medicine

## 2023-06-13 ENCOUNTER — Telehealth: Payer: Self-pay

## 2023-06-13 ENCOUNTER — Encounter: Payer: Self-pay | Admitting: Family Medicine

## 2023-06-13 VITALS — BP 120/60 | HR 73 | Temp 98.0°F | Resp 18 | Ht 66.0 in | Wt 207.6 lb

## 2023-06-13 DIAGNOSIS — R350 Frequency of micturition: Secondary | ICD-10-CM

## 2023-06-13 DIAGNOSIS — J0140 Acute pansinusitis, unspecified: Secondary | ICD-10-CM

## 2023-06-13 DIAGNOSIS — R051 Acute cough: Secondary | ICD-10-CM

## 2023-06-13 LAB — POC URINALSYSI DIPSTICK (AUTOMATED)
Bilirubin, UA: NEGATIVE
Blood, UA: NEGATIVE
Glucose, UA: POSITIVE — AB
Ketones, UA: NEGATIVE
Leukocytes, UA: NEGATIVE
Nitrite, UA: NEGATIVE
Protein, UA: NEGATIVE
Spec Grav, UA: 1.01 (ref 1.010–1.025)
Urobilinogen, UA: 0.2 U/dL
pH, UA: 5 (ref 5.0–8.0)

## 2023-06-13 MED ORDER — FLUTICASONE PROPIONATE 50 MCG/ACT NA SUSP: 2.0000 | Freq: Every day | NASAL | 6 refills | Status: DC

## 2023-06-13 MED ORDER — BENZONATATE 100 MG PO CAPS
200.0000 mg | ORAL_CAPSULE | Freq: Three times a day (TID) | ORAL | 0 refills | Status: DC | PRN
Start: 2023-06-13 — End: 2023-09-13

## 2023-06-13 MED ORDER — AMOXICILLIN-POT CLAVULANATE 875-125 MG PO TABS: 1.0000 | ORAL_TABLET | Freq: Two times a day (BID) | ORAL | 0 refills | Status: DC

## 2023-06-13 MED ORDER — TERCONAZOLE 80 MG VA SUPP: 80.0000 mg | Freq: Every day | VAGINAL | 0 refills | Status: DC

## 2023-06-13 NOTE — Patient Instructions (Signed)

## 2023-06-13 NOTE — Telephone Encounter (Signed)
Details for arrhythmia episode received (all types) Episode details were received for a spontaneous SVT episode, which was detected on Jun 13, 2023, 1:10:19 AM. Duration: 58 secs Rate: 174 atrial/174 ventricular. Appears 1:1 atrial driven SVT.   Patient does not report any symptoms at time of event, was asleep. She does note that she has had a cold in recent days and has been self medicating with OTC cold and flu preparations.  Instructed her to follow up with her pharamcist on safe preparations for heart patients as a lot of these contain products that could contribute to tachycardia/arrhythmias.    Denies any missed doses of medications. Otherwise doing well.  She verbalizes understanding and risks of OTC meds and will discuss with pharmacy.  Has our device clinic number to call back if any concerns.

## 2023-06-13 NOTE — Progress Notes (Signed)
Established Patient Office Visit  Subjective   Patient ID: Karen Lutz, female    DOB: 1970/09/23  Age: 52 y.o. MRN: [id number]  Chief Complaint  Patient presents with   New Patient (Initial Visit)    Pt states having a cold for 2 weeks and states having urine freq.     HPI Discussed the use of AI scribe software for clinical note transcription with the patient, who gave verbal consent to proceed.  History of Present Illness   The patient, with a history of congestive heart failure, presents with a two-week history of cold symptoms, including sinus pressure, headache, sore throat, and productive cough with greenish sputum. She has been self-treating with over-the-counter medications, including Doxil and sinus headache medication, with minimal relief. She also reports experiencing chills and feverish symptoms, though she has not checked her temperature.  In addition to her cold symptoms, the patient reports increased urinary frequency. She also mentions that she has been experiencing palpitations, which she attributes to her over-the-counter cold medication.  The patient also has a history of hemorrhoids, for which she was recently hospitalized. She reports that her symptoms have improved over the past week, though she still experiences some discomfort.  The patient also mentions that she underwent weight loss surgery a year ago, resulting in a loss of over 120 pounds. She reports feeling much better since the surgery, with less knee pain and overall improved health.      Patient Active Problem List   Diagnosis Date Noted   Prediabetes 11/08/2022   Class 2 obesity 11/07/2022   Acute metabolic encephalopathy 11/07/2022   Chronic combined systolic and diastolic heart failure (HCC) 11/05/2022   Acute HFrEF (heart failure with reduced ejection fraction) (HCC) 11/05/2022   Hyperglycemia 11/05/2022   Prolonged Q-T interval on ECG 11/05/2022   VF (ventricular fibrillation) (HCC)  11/05/2022   Cardiopulmonary arrest (HCC) 11/05/2022   Cardiac arrest (HCC) 11/01/2022   Hypokalemia 11/01/2022   Acute respiratory failure with hypoxia (HCC) 11/01/2022   Cardiogenic shock (HCC) 11/01/2022   Ventricular fibrillation (HCC) 11/01/2022   Visit for wound check 10/03/2020   OA (osteoarthritis) of knee 05/09/2020   Epigastric pain 07/03/2013   Anemia 07/03/2013   Migraine 07/03/2013   Mood disorder (HCC) 02/15/2011   Iron deficiency anemia 02/15/2011   Uterine fibroid 02/15/2011   GERD (gastroesophageal reflux disease) 02/15/2011   Past Medical History:  Diagnosis Date   Acid reflux    Anemia    Fibroids    Heart murmur    Knee pain    UTI (lower urinary tract infection)    Past Surgical History:  Procedure Laterality Date   ABDOMINAL HYSTERECTOMY     BARIATRIC SURGERY     [name] Single x1 week ago   ceaserian     CESAREAN SECTION     CESAREAN SECTION     ICD IMPLANT N/A 11/09/2022   Procedure: ICD IMPLANT;  Surgeon: Lanier Prude, MD;  Location: [facility] INVASIVE CV LAB;  Service: Cardiovascular;  Laterality: N/A;   KNEE SURGERY Left    KNEE SURGERY     RIGHT/LEFT HEART CATH AND CORONARY ANGIOGRAPHY N/A 11/06/2022   Procedure: RIGHT/LEFT HEART CATH AND CORONARY ANGIOGRAPHY;  Surgeon: Laurey Morale, MD;  Location: [facility] INVASIVE CV LAB;  Service: Cardiovascular;  Laterality: N/A;   Social History   Tobacco Use   Smoking status: Never   Smokeless tobacco: Never  Vaping Use   Vaping status: Never Used  Substance Use  Topics   Alcohol use: No   Drug use: Never   Social History   Socioeconomic History   Marital status: Legally Separated    Spouse name: Not on file   Number of children: Not on file   Years of education: Not on file   Highest education level: Not on file  Occupational History   Not on file  Tobacco Use   Smoking status: Never   Smokeless tobacco: Never  Vaping Use   Vaping status: Never Used  Substance and Sexual Activity   Alcohol  use: No   Drug use: Never   Sexual activity: Yes    Birth control/protection: None  Other Topics Concern   Not on file  Social History Narrative   Not on file   Social Determinants of Health   Financial Resource Strain: Low Risk  (11/02/2022)   Overall Financial Resource Strain (CARDIA)    Difficulty of Paying Living Expenses: Not hard at all  Food Insecurity: Low Risk  (03/12/2023)   Received from [facility]   Hunger Vital Sign    Worried About Running Out of Food in the Last Year: Never true    Ran Out of Food in the Last Year: Never true  Transportation Needs: Not on file (03/12/2023)  Physical Activity: Not on file  Stress: Not on file  Social Connections: Unknown (12/13/2021)   Received from [facility], [facility]   Social Network    Social Network: Not on file  Intimate Partner Violence: Unknown (11/10/2021)   Received from [facility], [facility]   HITS    Physically Hurt: Not on file    Insult or Talk Down To: Not on file    Threaten Physical Harm: Not on file    Scream or Curse: Not on file   Family Status  Relation Name Status   Other  (Not Specified)   Mother  Alive   Father  Deceased   MGM  (Not Specified)   MGF  (Not Specified)   PGM  (Not Specified)   PGF  (Not Specified)  No partnership data on file   Family History  Problem Relation Age of Onset   Hypertension Other    Cancer Other    Alcohol abuse Mother    Heart disease Mother    Alcohol abuse Father    Heart disease Father    Arthritis Maternal Grandmother    Arthritis Maternal Grandfather    Arthritis Paternal Grandmother    Arthritis Paternal Grandfather    Allergies  Allergen Reactions   Penicillins Rash    Has patient had a PCN reaction causing immediate rash, facial/tongue/throat swelling, SOB or lightheadedness with hypotension: unknown Has patient had a PCN reaction causing severe rash involving mucus membranes or skin necrosis: unknown Has patient had a PCN reaction that  required hospitalization : yes Has patient had a PCN reaction occurring within the last 10 years: no If all of the above answers are "NO", then may proceed with Cephalosporin use.    Penicillin G Other (See Comments)    Per patient, childhood allergy, reaction unknown.        ROS    Objective:     BP 120/60 (BP Location: Right Arm, Patient Position: Sitting, Cuff Size: Large)   Pulse 73   Temp 98 F (36.7 C) (Oral)   Resp 18   Ht 5\' 6"  (1.676 m)   Wt 207 lb 9.6 oz (94.2 kg)   LMP  (LMP Unknown)  SpO2 98%   BMI 33.51 kg/m  BP Readings from Last 3 Encounters:  06/13/23 120/60  06/10/23 106/68  05/22/23 112/71   Wt Readings from Last 3 Encounters:  06/13/23 207 lb 9.6 oz (94.2 kg)  06/10/23 204 lb 9.6 oz (92.8 kg)  05/22/23 204 lb (92.5 kg)   SpO2 Readings from Last 3 Encounters:  06/13/23 98%  06/10/23 99%  05/22/23 100%      Physical Exam   Results for orders placed or performed in visit on 06/13/23  POCT Urinalysis Dipstick (Automated)  Result Value Ref Range   Color, UA yellow    Clarity, UA clear    Glucose, UA Positive (A) Negative   Bilirubin, UA negative    Ketones, UA negative    Spec Grav, UA 1.010 1.010 - 1.025   Blood, UA negative    pH, UA 5.0 5.0 - 8.0   Protein, UA Negative Negative   Urobilinogen, UA 0.2 0.2 or 1.0 E.U./dL   Nitrite, UA negative    Leukocytes, UA Negative Negative    Last CBC Lab Results  Component Value Date   WBC 5.0 11/06/2022   HGB 11.2 (L) 11/06/2022   HGB 11.6 (L) 11/06/2022   HCT 33.0 (L) 11/06/2022   HCT 34.0 (L) 11/06/2022   MCV 87.7 11/06/2022   MCH 26.9 11/06/2022   RDW 15.4 11/06/2022   PLT 142 (L) 11/06/2022   Last metabolic panel Lab Results  Component Value Date   GLUCOSE 89 06/10/2023   NA 141 06/10/2023   K 3.7 06/10/2023   CL 105 06/10/2023   CO2 26 06/10/2023   BUN 7 06/10/2023   CREATININE 0.87 06/10/2023   GFRNONAA >60 06/10/2023   CALCIUM 10.1 06/10/2023   PHOS 3.8 11/06/2022    PROT 5.8 (L) 11/05/2022   ALBUMIN 2.5 (L) 11/05/2022   BILITOT 0.5 11/05/2022   ALKPHOS 74 11/05/2022   AST 14 (L) 11/05/2022   ALT 17 11/05/2022   ANIONGAP 10 06/10/2023   Last lipids Lab Results  Component Value Date   CHOL 124 02/07/2011   HDL 42 02/07/2011   LDLCALC 66 02/07/2011   TRIG 82 02/07/2011   CHOLHDL 3.0 02/07/2011   Last hemoglobin A1c Lab Results  Component Value Date   HGBA1C 5.1 11/02/2022   Last thyroid functions Lab Results  Component Value Date   TSH 2.716 11/06/2022   Last vitamin D Lab Results  Component Value Date   VD25OH 31.72 11/02/2022   Last vitamin B12 and Folate Lab Results  Component Value Date   VITAMINB12 710 11/02/2022   FOLATE 1.8 (L) 11/02/2022      The ASCVD Risk score (Arnett DK, et al., 2019) failed to calculate for the following reasons:   Cannot find a previous HDL lab   Cannot find a previous total cholesterol lab    Assessment & Plan:   Problem List Items Addressed This Visit   None Visit Diagnoses     Acute non-recurrent pansinusitis    -  Primary   Relevant Medications   amoxicillin-clavulanate (AUGMENTIN) 875-125 MG tablet   fluticasone (FLONASE) 50 MCG/ACT nasal spray   benzonatate (TESSALON PERLES) 100 MG capsule   Urine frequency       Relevant Orders   POCT Urinalysis Dipstick (Automated) (Completed)   Urine Culture   Acute cough       Relevant Medications   benzonatate (TESSALON PERLES) 100 MG capsule     Assessment and Plan    Upper Respiratory  Infection Two-week history of sinus pressure, cough with greenish sputum, and sore throat. Over-the-counter medications have not been effective. -Prescribe Augmentin. -Prescribe Flonase nasal spray. -Advise to add over-the-counter Claritin to help dry out the drainage.  Urinary Frequency No abnormalities noted in initial urine test, but culture results are pending. -Monitor for culture results and adjust treatment plan if necessary.  Congestive  Heart Failure Patient is on Farxiga, but insurance is denying coverage. -Cardiology to adjust medication as necessary based on insurance coverage.  Hemorrhoids Recent hospital visit for hemorrhoids, currently improved. -Advise continued use of hydrocortisone suppositories. -Advise Epsom salt baths for relief. -Follow-up with [facility] if symptoms worsen.        Return if symptoms worsen or fail to improve.    Donato Schultz, DO

## 2023-06-14 LAB — URINE CULTURE
MICRO NUMBER:: 15699959
SPECIMEN QUALITY:: ADEQUATE

## 2023-06-17 ENCOUNTER — Other Ambulatory Visit (HOSPITAL_COMMUNITY): Payer: Self-pay

## 2023-06-17 ENCOUNTER — Other Ambulatory Visit (HOSPITAL_COMMUNITY): Payer: Self-pay | Admitting: Cardiology

## 2023-06-17 ENCOUNTER — Telehealth (HOSPITAL_COMMUNITY): Payer: Self-pay

## 2023-06-17 MED ORDER — FUROSEMIDE 40 MG PO TABS: 40.0000 mg | ORAL_TABLET | Freq: Every day | ORAL | 11 refills | Status: DC

## 2023-06-17 NOTE — Telephone Encounter (Signed)
Advanced Heart Failure Patient Advocate Encounter  Prior authorization for Marcelline Deist has been submitted and approved. Test billing returns $4 for 90 day supply.  Key: [id number] Effective: 06/17/2023 to 06/16/2024  Burnell Blanks, CPhT Rx Patient Advocate Phone: [phone]

## 2023-06-19 ENCOUNTER — Telehealth (HOSPITAL_COMMUNITY): Payer: Self-pay

## 2023-06-19 NOTE — Telephone Encounter (Signed)
Medication Samples have been provided to the patient.  Drug name: Marcelline Deist       Strength: 10mg         Qty: 2  LOT: [id number]  Exp.Date: 01/03/2026  Dosing instructions: take 1 tab po qd  The patient has been instructed regarding the correct time, dose, and frequency of taking this medication, including desired effects and most common side effects.   Kaeleb Emond R Charisse Wendell 11:43 AM 06/19/2023

## 2023-06-19 NOTE — Telephone Encounter (Signed)
Disregard last telephone note samples WERE NOT given

## 2023-06-24 NOTE — Telephone Encounter (Signed)
Biotronik remote received for nST episode with unknown duration. Patient denies any symptoms over the weekend. Reports compliance with coreg 12.5mg  BID. Clock reset in Biotronik to recheck presenting tomorrow 06/25/23. Pt aware to call if any symptoms arise.

## 2023-06-25 ENCOUNTER — Other Ambulatory Visit (HOSPITAL_COMMUNITY): Payer: Self-pay | Admitting: Cardiology

## 2023-06-26 NOTE — Telephone Encounter (Signed)
Does appear patients rate has slowed back down to regular rhythm. Does show this episode with NSVT <20 beats in duration.

## 2023-07-15 ENCOUNTER — Telehealth: Payer: Self-pay

## 2023-07-15 NOTE — Telephone Encounter (Signed)
Biotronik alert for tachy event 07/12/23 w/ unknown duration. Pt called, reports asymptomatic and asleep at the time. Complaint with meds including no missed doses. Pt aware to call if she has any symptoms. Clock reset in Bio to resend 07/16/23.

## 2023-08-03 ENCOUNTER — Other Ambulatory Visit (HOSPITAL_COMMUNITY): Payer: Self-pay | Admitting: Cardiology

## 2023-08-12 ENCOUNTER — Ambulatory Visit (INDEPENDENT_AMBULATORY_CARE_PROVIDER_SITE_OTHER): Payer: 59

## 2023-08-12 DIAGNOSIS — I469 Cardiac arrest, cause unspecified: Secondary | ICD-10-CM | POA: Diagnosis not present

## 2023-08-12 LAB — CUP PACEART REMOTE DEVICE CHECK
Date Time Interrogation Session: 20250106102722
Implantable Lead Connection Status: 753985
Implantable Lead Implant Date: 20240405
Implantable Lead Location: 753860
Implantable Lead Model: 436909
Implantable Lead Serial Number: 81561050
Implantable Pulse Generator Implant Date: 20240405
Pulse Gen Model: 429525
Pulse Gen Serial Number: 84953112

## 2023-08-31 ENCOUNTER — Other Ambulatory Visit (HOSPITAL_COMMUNITY): Payer: Self-pay | Admitting: Cardiology

## 2023-09-09 ENCOUNTER — Other Ambulatory Visit (HOSPITAL_COMMUNITY): Payer: Self-pay

## 2023-09-09 ENCOUNTER — Telehealth (HOSPITAL_COMMUNITY): Payer: Self-pay

## 2023-09-09 NOTE — Telephone Encounter (Signed)
Patient Advocate Encounter  Returned patient call regarding Farxiga copay. Patient stated that her copay card is not working at the pharmacy. During the discussion, we realized that the patient registered with the savings card website multiple times in a row. Patient provided me with 5 separate ID numbers ([id number], [id number], [id number], [id number], [id number]) that all reject as non matched ID.  I informed patient that she will need to call to verify the correct ID number, as copay card program will not provide patient information to anyone other than patient.  Patient expressed agitation with savings card and pharmacy, but stated that she may try to call once she calms down a bit. I advised her to contact me directly if there is anything else we can do to help her resolve this issue.  Burnell Blanks, CPhT Rx Patient Advocate Phone: [phone]

## 2023-09-13 ENCOUNTER — Other Ambulatory Visit (HOSPITAL_COMMUNITY): Payer: Self-pay | Admitting: Cardiology

## 2023-09-13 ENCOUNTER — Ambulatory Visit (HOSPITAL_COMMUNITY)
Admission: RE | Admit: 2023-09-13 | Discharge: 2023-09-13 | Disposition: A | Discharge status: Home / Self Care | Payer: 59 | Source: Ambulatory Visit | Attending: Family Medicine | Admitting: Family Medicine

## 2023-09-13 ENCOUNTER — Telehealth (HOSPITAL_COMMUNITY): Payer: Self-pay | Admitting: Cardiology

## 2023-09-13 ENCOUNTER — Encounter (HOSPITAL_COMMUNITY): Payer: Self-pay

## 2023-09-13 ENCOUNTER — Inpatient Hospital Stay (HOSPITAL_COMMUNITY)
Admission: RE | Admit: 2023-09-13 | Discharge: 2023-09-13 | Disposition: A | Discharge status: Home / Self Care | Payer: 59 | Source: Ambulatory Visit | Attending: Cardiology

## 2023-09-13 VITALS — BP 100/66 | HR 77 | Wt 204.2 lb

## 2023-09-13 DIAGNOSIS — Z79899 Other long term (current) drug therapy: Secondary | ICD-10-CM | POA: Insufficient documentation

## 2023-09-13 DIAGNOSIS — I493 Ventricular premature depolarization: Secondary | ICD-10-CM | POA: Diagnosis not present

## 2023-09-13 DIAGNOSIS — R079 Chest pain, unspecified: Secondary | ICD-10-CM | POA: Diagnosis not present

## 2023-09-13 DIAGNOSIS — I5022 Chronic systolic (congestive) heart failure: Secondary | ICD-10-CM | POA: Insufficient documentation

## 2023-09-13 DIAGNOSIS — R002 Palpitations: Secondary | ICD-10-CM | POA: Diagnosis not present

## 2023-09-13 DIAGNOSIS — I4901 Ventricular fibrillation: Secondary | ICD-10-CM | POA: Insufficient documentation

## 2023-09-13 DIAGNOSIS — Z95810 Presence of automatic (implantable) cardiac defibrillator: Secondary | ICD-10-CM | POA: Diagnosis not present

## 2023-09-13 DIAGNOSIS — Z9884 Bariatric surgery status: Secondary | ICD-10-CM | POA: Diagnosis not present

## 2023-09-13 DIAGNOSIS — E669 Obesity, unspecified: Secondary | ICD-10-CM | POA: Insufficient documentation

## 2023-09-13 DIAGNOSIS — Z8249 Family history of ischemic heart disease and other diseases of the circulatory system: Secondary | ICD-10-CM | POA: Insufficient documentation

## 2023-09-13 DIAGNOSIS — I428 Other cardiomyopathies: Secondary | ICD-10-CM | POA: Diagnosis not present

## 2023-09-13 DIAGNOSIS — Z6832 Body mass index (BMI) 32.0-32.9, adult: Secondary | ICD-10-CM | POA: Insufficient documentation

## 2023-09-13 LAB — BASIC METABOLIC PANEL
Anion gap: 7 (ref 5–15)
BUN: 10 mg/dL (ref 6–20)
CO2: 26 mmol/L (ref 22–32)
Calcium: 9.7 mg/dL (ref 8.9–10.3)
Chloride: 110 mmol/L (ref 98–111)
Creatinine, Ser: 0.97 mg/dL (ref 0.44–1.00)
GFR, Estimated: >60 mL/min (ref >60)
Glucose, Bld: 74 mg/dL (ref 70–99)
Potassium: 3.7 mmol/L (ref 3.5–5.1)
Sodium: 143 mmol/L (ref 135–145)

## 2023-09-13 LAB — BRAIN NATRIURETIC PEPTIDE: B Natriuretic Peptide: 54.9 pg/mL (ref 0.0–100.0)

## 2023-09-13 LAB — TSH: TSH: 0.788 u[IU]/mL (ref 0.350–4.500)

## 2023-09-13 NOTE — Progress Notes (Signed)
 ADVANCED HEART FAILURE CLINIC NOTE  Primary Care: Antonio Cyndee Jamee JONELLE, DO HF Cardiologist: Dr. Gardenia  HPI: Karen Lutz is a 53 y.o. female with history of heart failure with reduced action fraction, obesity status post bariatric surgery in November 2023 at [facility] and a significant family history of early systolic heart failure that presents today to establish care.  Her cardiac history dates back to November 01, 2022 when she was found unresponsive by her boyfriend.  Her rhythm on arrival was ventricular fibrillation with an unknown downtime.  She underwent CPR for 23 minutes before ROSC.  On arrival she was found to be hypokalemic with a QTc of 511 ms with normalization after correction of hypokalemia.  She was intubated and placed on pressors with echocardiogram demonstrating EF of 25%, cardiac MRI with a EF of 22% and preserved RV function.  She had a Biotronik secondary prevention ICD placed was started on GDMT and discharged home 1 week later.   She has now seen Dr. Danford Pac for genetics; screening echocardiogram is recommended for her family.   Interval history - Today she returns for an acute visit with c/o chest tightness. This started a week ago when she was given clindamycin Rx for toothache. Feels chest pain and irration more so after she eats. Starting having diarrhea. Just started feeling heart palpitations. No SOB. Legs are swelling. Denies abnormal bleeding, dizziness, or PND/Orthopnea. Appetite ok. No fever or chills. Weight at home 194-200 pounds. Taking all medications.   Past Medical History:  Diagnosis Date   Acid reflux    Anemia    Fibroids    Heart murmur    Knee pain    UTI (lower urinary tract infection)    Current Outpatient Medications  Medication Sig Dispense Refill   aspirin  81 MG chewable tablet Chew 1 tablet (81 mg total) by mouth daily. 30 tablet 3   carvedilol  (COREG ) 12.5 MG tablet Take 1 tablet (12.5 mg total) by mouth  2 (two) times daily with a meal. 180 tablet 3   dapagliflozin  propanediol (FARXIGA ) 10 MG TABS tablet Take 1 tablet (10 mg total) by mouth daily. 30 tablet 11   ENTRESTO  49-51 MG TAKE 1 TABLET BY MOUTH TWICE A DAY 60 tablet 2   fluticasone  (FLONASE ) 50 MCG/ACT nasal spray Place 2 sprays into both nostrils daily. 16 g 6   furosemide  (LASIX ) 40 MG tablet Take 1 tablet (40 mg total) by mouth daily. 30 tablet 11   hydrocortisone  (ANUSOL -HC) 25 MG suppository Place 1 suppository (25 mg total) rectally 2 (two) times daily. (Patient taking differently: Place 25 mg rectally 2 (two) times daily as needed.) 12 suppository 0   lidocaine  (XYLOCAINE ) 5 % ointment Apply 1 Application topically 2 (two) times daily as needed. 30 g 0   omeprazole  (PRILOSEC OTC) 20 MG tablet Take 20 mg by mouth daily. Check with Dr about getting prescription strength omperazole     spironolactone  (ALDACTONE ) 25 MG tablet TAKE 1 TABLET BY MOUTH EVERY NIGHT AT BEDTIME 30 tablet 3   terconazole  (TERAZOL 3 ) 80 MG vaginal suppository Place 1 suppository (80 mg total) vaginally at bedtime. (Patient taking differently: Place 80 mg vaginally at bedtime. As needed) 3 suppository 0   No current facility-administered medications for this encounter.   Allergies  Allergen Reactions   Penicillins Rash    Has patient had a PCN reaction causing immediate rash, facial/tongue/throat swelling, SOB or lightheadedness with hypotension: unknown Has patient had a  PCN reaction causing severe rash involving mucus membranes or skin necrosis: unknown Has patient had a PCN reaction that required hospitalization : yes Has patient had a PCN reaction occurring within the last 10 years: no If all of the above answers are NO, then may proceed with Cephalosporin use.    Penicillin G Other (See Comments)    Per patient, childhood allergy, reaction unknown.      Social History   Socioeconomic History   Marital status: Legally Separated    Spouse name:  Not on file   Number of children: Not on file   Years of education: Not on file   Highest education level: Not on file  Occupational History   Not on file  Tobacco Use   Smoking status: Never   Smokeless tobacco: Never  Vaping Use   Vaping status: Never Used  Substance and Sexual Activity   Alcohol use: No   Drug use: Never   Sexual activity: Yes    Birth control/protection: None  Other Topics Concern   Not on file  Social History Narrative   Not on file   Social Drivers of Health   Financial Resource Strain: Low Risk  (11/02/2022)   Overall Financial Resource Strain (CARDIA)    Difficulty of Paying Living Expenses: Not hard at all  Food Insecurity: Low Risk  (03/12/2023)   Received from [facility]   Hunger Vital Sign    Worried About Running Out of Food in the Last Year: Never true    Ran Out of Food in the Last Year: Never true  Transportation Needs: Not on file (03/12/2023)  Physical Activity: Not on file  Stress: Not on file  Social Connections: Unknown (12/13/2021)   Received from [facility], [facility]   Social Network    Social Network: Not on file  Intimate Partner Violence: Unknown (11/10/2021)   Received from [facility], [facility]   HITS    Physically Hurt: Not on file    Insult or Talk Down To: Not on file    Threaten Physical Harm: Not on file    Scream or Curse: Not on file    Family History  Problem Relation Age of Onset   Hypertension Other    Cancer Other    Alcohol abuse Mother    Heart disease Mother    Alcohol abuse Father    Heart disease Father    Arthritis Maternal Grandmother    Arthritis Maternal Grandfather    Arthritis Paternal Grandmother    Arthritis Paternal Grandfather    Wt Readings from Last 3 Encounters:  09/13/23 92.6 kg (204 lb 3.2 oz)  06/13/23 94.2 kg (207 lb 9.6 oz)  06/10/23 92.8 kg (204 lb 9.6 oz)   BP 100/66   Pulse 77   Wt 92.6 kg (204 lb 3.2 oz)   LMP  (LMP Unknown)   SpO2 98%   BMI 32.96 kg/m    PHYSICAL EXAM: General:  NAD. No resp difficulty, walked into clinic HEENT: Normal Neck: Supple. No JVD. Cor: Regular rate & rhythm. No rubs, gallops or murmurs. Mid sternum + TTP Lungs: Clear Abdomen: Soft, nondistended. + TTP RUQ Extremities: No cyanosis, clubbing, rash, edema Neuro: Alert & oriented x 3, moves all 4 extremities w/o difficulty. Affect pleasant.  ReDs reading: 31 %, normal  DATA REVIEW  ECG: 09/13/23: NSR with PVC, 71 bpm (personally reviewed)  ECHO: 10/12/22: LVEF 25%, grade 2 diastolic dysfunction, normal RV function as per my personal interpretation 02/22/23:  LVEF 25%-30%, normal RV function.   CATH: 1.Normal coronaries 2. Elevated filling pressures.  3. Mild pulmonary venous hypertension.  4. Preserved cardiac output.  1 point  CMR: 11/07/22: 1.  Moderately dilated LV with global hypokinesis, EF 22%.  2.  Normal RV size with EF 50%.  3. No myocardial LGE, so no definitive evidence for prior MI, myocarditis, or infiltrative disease.  ASSESSMENT & PLAN: Chest pain:  - Pain started when she started clinda, and is worse after eating. Abdomen and chest tender to palpitation on exam today - ECG today without acute ST-T changes, ReDS normal at 31% - Suspect GI-related, likely from abx - OK to increase omeprazole  to 40 mg daily. Needs PCP follow up  - Will update echo  Heart failure with reduced ejection fraction Etiology of HF: Nonischemic cardiomyopathy with cardiac MRI not demonstrating any infiltrative cardiomyopathy or LGE.  Dr. Gardenia spoke with her family while inpatient and they commented on significant family history of heart failure and multiple family members before the age of 87-60.  They may possibly have 1 family member with sudden cardiac arrest.  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  Seen by Dr. Danford Pac; recommended clinical screening for children.  NYHA class / AHA Stage: NYHA I-II Volume status &  Diuretics: Euvolemic. Continue Lasix  40 mg daily Vasodilators: Continue Entresto  49/51 mg bid Beta-Blocker: Continue Coreg  12.5 mg bid MRA: Continue spironolactone  25 mg daily.   Cardiometabolic: Continue Farxiga  10 mg. No GU symptoms. Devices therapies & Valvulopathies: Secondary to an ICD in place Advanced therapies: Continue GDMT; currently NYHA I-II.  Repeat echo ~09/2023 - Possible CPX if LVEF does not improve.  2.  V-fib arrest - No etiology determined at this point.  EP following.  Hypokalemic on arrival during her arrest.   - JUP gene positive; associated with ARVD.  Screening recommended for family members  3.  Obesity - Body mass index is 32.96 kg/m. - She has done very well; s/p gastric bypass (11/23).   4. Palpitations - PVC on ECG today - Labs from 07/11/23 reviewed; K 3.7, SCr 0.83, Tsat 15%, iron  61, hgb 13.6 - Will place 2 week Zio to quantify burden - Check labs and TSH today  Keep follow up this month with Dr Gardenia, as scheduled. Will add echo  [place], FNP-BC 09/13/23

## 2023-09-13 NOTE — Patient Instructions (Addendum)
 Thank you for coming in today  If you had labs drawn today, any labs that are abnormal the clinic will call you No news is good news  Medications: Increase Omeprazole  (over the counter) to 40 mg daily   Follow up appointments:  Your physician recommends that you schedule a follow-up appointment in:  Echocardiogram 09/26/2023 3:00pm  Please address tooth abscess and acid reflux with Primary Care Provider   Do the following things EVERYDAY: Weigh yourself in the morning before breakfast. Write it down and keep it in a log. Take your medicines as prescribed Eat low salt foods--Limit salt (sodium) to 2000 mg per day.  Stay as active as you can everyday Limit all fluids for the day to less than 2 liters   At the Advanced Heart Failure Clinic, you and your health needs are our priority. As part of our continuing mission to provide you with exceptional heart care, we have created designated Provider Care Teams. These Care Teams include your primary Cardiologist (physician) and Advanced Practice Providers (APPs- Physician Assistants and Nurse Practitioners) who all work together to provide you with the care you need, when you need it.   You may see any of the following providers on your designated Care Team at your next follow up: Dr Toribio Fuel Dr Ezra Shuck Dr. Ria Gardenia Greig Lenetta, NP Caffie Shed, [place] [employer] [place], [place] Beckey Coe, NP Tinnie Redman, PharmD   Please be sure to bring in all your medications bottles to every appointment.    Thank you for choosing [facility]  If you have any questions or concerns before your next appointment please send us  a message through [place] or call our office at [phone].    TO LEAVE A MESSAGE FOR THE NURSE SELECT OPTION 2, PLEASE LEAVE A MESSAGE INCLUDING: YOUR NAME DATE OF BIRTH CALL BACK NUMBER REASON FOR CALL**this is important as we prioritize the call  backs  YOU WILL RECEIVE A CALL BACK THE SAME DAY AS LONG AS YOU CALL BEFORE 4:00 PM

## 2023-09-13 NOTE — Progress Notes (Signed)
 ReDS Vest / Clip - 09/13/23 1600       ReDS Vest / Clip   Station Marker C    Ruler Value 30    ReDS Value Range Low volume    ReDS Actual Value 31

## 2023-09-13 NOTE — Telephone Encounter (Signed)
 Patient called to report chest tightness x 5 days  -reports she has been dealing with a toothache, seen by virtual provider and given clindamycin about a week ago  -reports since starting meds noticing increase in chest tightness, pains comes and going-started on Monday  -reports CP is heavy (heartburn like) -reports pains comes and goes -nothing makes it worse or better  -no NTG taken -no viatal -no SOB,dizziness, HA   Please advise

## 2023-09-13 NOTE — Telephone Encounter (Signed)
 Add on 2/7 @ 3

## 2023-09-18 NOTE — Progress Notes (Signed)
Remote ICD transmission.

## 2023-09-26 ENCOUNTER — Ambulatory Visit (HOSPITAL_COMMUNITY)
Admission: RE | Admit: 2023-09-26 | Discharge: 2023-09-26 | Disposition: A | Discharge status: Home / Self Care | Payer: 59 | Source: Ambulatory Visit | Attending: Cardiology | Admitting: Cardiology

## 2023-09-26 ENCOUNTER — Ambulatory Visit (HOSPITAL_COMMUNITY)
Admission: RE | Admit: 2023-09-26 | Discharge: 2023-09-26 | Discharge status: Home / Self Care | Payer: 59 | Source: Ambulatory Visit | Attending: Cardiology

## 2023-09-26 ENCOUNTER — Encounter (HOSPITAL_COMMUNITY): Payer: Self-pay | Admitting: Cardiology

## 2023-09-26 VITALS — BP 100/60 | HR 59 | Wt 201.8 lb

## 2023-09-26 DIAGNOSIS — I428 Other cardiomyopathies: Secondary | ICD-10-CM | POA: Insufficient documentation

## 2023-09-26 DIAGNOSIS — Z9884 Bariatric surgery status: Secondary | ICD-10-CM | POA: Diagnosis not present

## 2023-09-26 DIAGNOSIS — E876 Hypokalemia: Secondary | ICD-10-CM | POA: Diagnosis not present

## 2023-09-26 DIAGNOSIS — I4901 Ventricular fibrillation: Secondary | ICD-10-CM | POA: Insufficient documentation

## 2023-09-26 DIAGNOSIS — Z79899 Other long term (current) drug therapy: Secondary | ICD-10-CM | POA: Diagnosis not present

## 2023-09-26 DIAGNOSIS — I5022 Chronic systolic (congestive) heart failure: Secondary | ICD-10-CM

## 2023-09-26 DIAGNOSIS — Z5986 Financial insecurity: Secondary | ICD-10-CM | POA: Diagnosis not present

## 2023-09-26 DIAGNOSIS — I509 Heart failure, unspecified: Secondary | ICD-10-CM | POA: Insufficient documentation

## 2023-09-26 DIAGNOSIS — E669 Obesity, unspecified: Secondary | ICD-10-CM | POA: Diagnosis not present

## 2023-09-26 DIAGNOSIS — Z6833 Body mass index (BMI) 33.0-33.9, adult: Secondary | ICD-10-CM | POA: Diagnosis not present

## 2023-09-26 DIAGNOSIS — Z95810 Presence of automatic (implantable) cardiac defibrillator: Secondary | ICD-10-CM | POA: Insufficient documentation

## 2023-09-26 DIAGNOSIS — Z8249 Family history of ischemic heart disease and other diseases of the circulatory system: Secondary | ICD-10-CM | POA: Diagnosis not present
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
## 2023-09-26 NOTE — Patient Instructions (Signed)
 Medication Changes:  No Changes In Medications at this time.   WE WILL HAVE SOCIAL WORKER REACH OUT TO YOU   Follow-Up in: IN 3 MONTHS PLEASE CALL OUR OFFICE AROUND MARCH  TO GET SCHEDULED FOR YOUR APPOINTMENT. PHONE NUMBER IS [phone] OPTION 2   At the [facility], you and your health needs are our priority. We have a designated team specialized in the treatment of Heart Failure. This Care Team includes your primary Heart Failure Specialized Cardiologist (physician), Advanced Practice Providers (APPs- Physician Assistants and Nurse Practitioners), and Pharmacist who all work together to provide you with the care you need, when you need it.   You may see any of the following providers on your designated Care Team at your next follow up:  Dr. Arvilla Meres Dr. Marca Ancona Dr. Dorthula Nettles Dr. Theresia Bough Tonye Becket, NP Robbie Lis, [place] [facility] [place], [place] Brynda Peon, NP [place] Lee, NP Karle Plumber, PharmD   Please be sure to bring in all your medications bottles to every appointment.   Need to Contact us:  If you have any questions or concerns before your next appointment please send [place] a message through [place] or call our office at [phone].    TO LEAVE A MESSAGE FOR THE NURSE SELECT OPTION 2, PLEASE LEAVE A MESSAGE INCLUDING: YOUR NAME DATE OF BIRTH CALL BACK NUMBER REASON FOR CALL**this is important as we prioritize the call backs  YOU WILL RECEIVE A CALL BACK THE SAME DAY AS LONG AS YOU CALL BEFORE 4:00 PM

## 2023-09-26 NOTE — Progress Notes (Signed)
 Echocardiogram 2D Echocardiogram has been performed.  Karen Lutz 09/26/2023, 5:13 PM

## 2023-09-27 ENCOUNTER — Telehealth (HOSPITAL_COMMUNITY): Payer: Self-pay | Admitting: Cardiology

## 2023-09-27 ENCOUNTER — Telehealth (HOSPITAL_COMMUNITY): Payer: Self-pay | Admitting: Licensed Clinical Social Worker

## 2023-09-27 NOTE — Telephone Encounter (Signed)
 Medication Samples have been provided to the patient.  Drug name: farxiga       Strength: 10 mg        Qty: 28  LOT: [id number]  Exp.Date: 01/03/26  Dosing instructions: one tab daily  The patient has been instructed regarding the correct time, dose, and frequency of taking this medication, including desired effects and most common side effects.   Theresia Bough 10:46 AM 09/27/2023

## 2023-09-27 NOTE — Telephone Encounter (Signed)
 H&V Care Navigation CSW Progress Note  Clinical Social Worker consulted to speak with pt regarding financial concerns and insurance.  Patient reports she lives alone and is working part time to upkeep living expenses.  States she makes about $1,500/month which has been able to keep up with basic expenses.    Pt main concern right now is lack of dental insurance as she is having severe pain in several of her teeth.  CSW discussed need to apply for Medicaid as her income would qualify her and this would allow her to see a dentist.     CSW sent pt information about [facility] DHHS case workers and she plans to go today.  Also encouraged her to get food stamp application there if she is not already enrolled.  If she is not approved, CSW sent pt list of local dental clinics that offer free or reduced cost services to those without insurance.  Patient also wants to consider applying for SSA disability.  CSW explained typical timeline of application process- she would plan to continue working as she only works around 15 hours a week at her current job so should be able to maintain housing while she awaits decision.  No further needs at this time- CSW will plan to check in next week on progress.  SDOH Screenings   Food Insecurity: Low Risk  (03/12/2023)   Received from [facility]  Housing: Low Risk  (03/12/2023)   Received from [facility]  Utilities: Low Risk  (03/12/2023)   Received from [facility]  Depression (PHQ2-9): Medium Risk (01/24/2023)  Financial Resource Strain: Medium Risk (09/27/2023)  Social Connections: Unknown (12/13/2021)   Received from [facility], [facility]  Tobacco Use: Low Risk  (09/26/2023)   Burna Sis, LCSW Clinical Social Worker [facility] Desk#: [phone] Cell#: [phone]

## 2023-10-07 ENCOUNTER — Telehealth (HOSPITAL_COMMUNITY): Payer: Self-pay | Admitting: Licensed Clinical Social Worker

## 2023-10-07 NOTE — Telephone Encounter (Signed)
 CSW called pt to check in regarding referrals for Medicaid, disability, dental clinics, etc.  Unable to reach- left VM requesting return call  Burna Sis, LCSW Clinical Social Worker [facility] Desk#: [phone] Cell#: [phone]

## 2023-10-07 NOTE — Telephone Encounter (Signed)
 H&V Care Navigation CSW Progress Note  Clinical Social Worker received return call from pt.  Pt reports she was just approved for Medicaid today.  CSW informed her once she has knowledge of who is managing her medicaid she can call to get list of in network dental offices to get her teeth looked at.   Pt reports no other concerns at this time.   SDOH Screenings   Food Insecurity: Low Risk  (03/12/2023)   Received from [facility]  Housing: Low Risk  (03/12/2023)   Received from [facility]  Utilities: Low Risk  (03/12/2023)   Received from [facility]  Depression (PHQ2-9): Medium Risk (01/24/2023)  Financial Resource Strain: Medium Risk (09/27/2023)  Social Connections: Unknown (12/13/2021)   Received from [facility]  Tobacco Use: Low Risk  (10/03/2023)   Received from [facility]   Burna Sis, LCSW Clinical Social Worker [facility] Desk#: [phone] Cell#: [phone]

## 2023-10-10 ENCOUNTER — Telehealth (HOSPITAL_COMMUNITY): Payer: Self-pay | Admitting: Licensed Clinical Social Worker

## 2023-10-10 ENCOUNTER — Other Ambulatory Visit (HOSPITAL_COMMUNITY): Payer: Self-pay

## 2023-10-10 NOTE — Telephone Encounter (Signed)
 H&V Care Navigation CSW Progress Note  Clinical Social Worker received call from pt who reports she has received [facility]- provided pt with customer service number and encouraged her to call and get list of in network dental providers.    CSW will send CSW ID number so we can get coverage added to her chart.  Patient is participating in a Managed Medicaid Plan:  Yes  SDOH Screenings   Food Insecurity: Low Risk  (03/12/2023)   Received from [facility]  Housing: Low Risk  (03/12/2023)   Received from [facility]  Utilities: Low Risk  (03/12/2023)   Received from [facility]  Depression (PHQ2-9): Medium Risk (01/24/2023)  Financial Resource Strain: Medium Risk (09/27/2023)  Social Connections: Unknown (12/13/2021)   Received from [facility]  Tobacco Use: Low Risk  (10/03/2023)   Received from [facility]   Burna Sis, LCSW Clinical Social Worker [facility] Desk#: [phone] Cell#: [phone]

## 2023-10-17 LAB — ECHOCARDIOGRAM COMPLETE
AR max vel: 1.79 cm2
AV Peak grad: 9.5 mmHg
Ao pk vel: 1.54 m/s
Area-P 1/2: 3.37 cm2
Calc EF: 42.6 %
MV M vel: 4.06 m/s
MV Peak grad: 65.9 mmHg
S' Lateral: 4.6 cm
Single Plane A2C EF: 51.9 %
Single Plane A4C EF: 32.4 %
Weight: 3228.8 [oz_av]

## 2023-10-18 ENCOUNTER — Telehealth (HOSPITAL_COMMUNITY): Payer: Self-pay | Admitting: Surgery

## 2023-10-18 NOTE — Telephone Encounter (Signed)
 I called patient after receiving communication from the [employer] company that her device was pending return.  She acknowledges that she forgot to send it back.  She tells me that she will send it soon.

## 2023-10-23 ENCOUNTER — Other Ambulatory Visit: Payer: Self-pay

## 2023-10-23 ENCOUNTER — Emergency Department (HOSPITAL_COMMUNITY)
Admission: EM | Admit: 2023-10-23 | Discharge: 2023-10-23 | Disposition: A | Discharge status: Home / Self Care | Attending: Emergency Medicine | Admitting: Emergency Medicine

## 2023-10-23 ENCOUNTER — Encounter (HOSPITAL_COMMUNITY): Payer: Self-pay | Admitting: Emergency Medicine

## 2023-10-23 DIAGNOSIS — Y92194 Driveway of other specified residential institution as the place of occurrence of the external cause: Secondary | ICD-10-CM | POA: Diagnosis not present

## 2023-10-23 DIAGNOSIS — Z7982 Long term (current) use of aspirin: Secondary | ICD-10-CM | POA: Diagnosis not present

## 2023-10-23 DIAGNOSIS — S3992XA Unspecified injury of lower back, initial encounter: Secondary | ICD-10-CM | POA: Diagnosis present

## 2023-10-23 DIAGNOSIS — S39012A Strain of muscle, fascia and tendon of lower back, initial encounter: Secondary | ICD-10-CM | POA: Diagnosis not present

## 2023-10-23 MED ORDER — LIDOCAINE 5 % EX PTCH
2.0000 | MEDICATED_PATCH | CUTANEOUS | Status: DC
  Administered 2023-10-23: 2 via TRANSDERMAL
  Filled 2023-10-23: qty 2

## 2023-10-23 MED ORDER — LIDOCAINE 5 % EX PTCH: 1.0000 | MEDICATED_PATCH | CUTANEOUS | 0 refills | Status: DC

## 2023-10-23 MED ORDER — CYCLOBENZAPRINE HCL 10 MG PO TABS: 5.0000 mg | ORAL_TABLET | Freq: Every evening | ORAL | 0 refills | Status: DC | PRN
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
## 2023-10-23 NOTE — ED Notes (Signed)
 Reviewed D/C information with the patient, pt verbalized understanding. No additional concerns at this time.

## 2023-11-04 ENCOUNTER — Other Ambulatory Visit (HOSPITAL_COMMUNITY): Payer: Self-pay | Admitting: Cardiology

## 2023-11-11 ENCOUNTER — Ambulatory Visit (INDEPENDENT_AMBULATORY_CARE_PROVIDER_SITE_OTHER): Payer: BLUE CROSS/BLUE SHIELD

## 2023-11-11 DIAGNOSIS — I469 Cardiac arrest, cause unspecified: Secondary | ICD-10-CM | POA: Diagnosis not present

## 2023-11-12 LAB — CUP PACEART REMOTE DEVICE CHECK
Date Time Interrogation Session: 20250408093533
Implantable Lead Connection Status: 753985
Implantable Lead Implant Date: 20240405
Implantable Lead Location: 753860
Implantable Lead Model: 436909
Implantable Lead Serial Number: 81561050
Implantable Pulse Generator Implant Date: 20240405
Pulse Gen Model: 429525
Pulse Gen Serial Number: 84953112

## 2023-11-16 ENCOUNTER — Encounter: Payer: Self-pay | Admitting: Cardiology

## 2023-12-15 ENCOUNTER — Emergency Department (HOSPITAL_COMMUNITY)

## 2023-12-15 ENCOUNTER — Emergency Department (HOSPITAL_COMMUNITY)
Admission: EM | Admit: 2023-12-15 | Discharge: 2023-12-15 | Disposition: A | Discharge status: Home / Self Care | Attending: Emergency Medicine | Admitting: Emergency Medicine

## 2023-12-15 ENCOUNTER — Encounter (HOSPITAL_COMMUNITY): Payer: Self-pay

## 2023-12-15 ENCOUNTER — Other Ambulatory Visit: Payer: Self-pay

## 2023-12-15 DIAGNOSIS — R0602 Shortness of breath: Secondary | ICD-10-CM | POA: Diagnosis not present

## 2023-12-15 DIAGNOSIS — R079 Chest pain, unspecified: Secondary | ICD-10-CM | POA: Insufficient documentation

## 2023-12-15 DIAGNOSIS — R0789 Other chest pain: Secondary | ICD-10-CM | POA: Diagnosis not present

## 2023-12-15 DIAGNOSIS — Z95810 Presence of automatic (implantable) cardiac defibrillator: Secondary | ICD-10-CM | POA: Diagnosis not present

## 2023-12-15 DIAGNOSIS — Z7982 Long term (current) use of aspirin: Secondary | ICD-10-CM | POA: Diagnosis not present

## 2023-12-15 LAB — CBC
HCT: 42.5 % (ref 36.0–46.0)
Hemoglobin: 13.8 g/dL (ref 12.0–15.0)
MCH: 28.5 pg (ref 26.0–34.0)
MCHC: 32.5 g/dL (ref 30.0–36.0)
MCV: 87.6 fL (ref 80.0–100.0)
Platelets: 192 10*3/uL (ref 150–400)
RBC: 4.85 MIL/uL (ref 3.87–5.11)
RDW: 13.3 % (ref 11.5–15.5)
WBC: 4 10*3/uL (ref 4.0–10.5)
nRBC: 0 % (ref 0.0–0.2)

## 2023-12-15 LAB — BASIC METABOLIC PANEL WITH GFR
Anion gap: 8 (ref 5–15)
BUN: 15 mg/dL (ref 6–20)
CO2: 25 mmol/L (ref 22–32)
Calcium: 9.5 mg/dL (ref 8.9–10.3)
Chloride: 106 mmol/L (ref 98–111)
Creatinine, Ser: 0.87 mg/dL (ref 0.44–1.00)
GFR, Estimated: >60 mL/min (ref >60)
Glucose, Bld: 65 mg/dL — ABNORMAL LOW (ref 70–99)
Potassium: 3.7 mmol/L (ref 3.5–5.1)
Sodium: 139 mmol/L (ref 135–145)

## 2023-12-15 LAB — BRAIN NATRIURETIC PEPTIDE: B Natriuretic Peptide: 81.3 pg/mL (ref 0.0–100.0)

## 2023-12-15 LAB — TROPONIN I (HIGH SENSITIVITY)
Troponin I (High Sensitivity): 91 ng/L — ABNORMAL HIGH (ref <18)
Troponin I (High Sensitivity): 81 ng/L — ABNORMAL HIGH (ref <18)

## 2023-12-15 MED ORDER — HYDROCODONE-ACETAMINOPHEN 5-325 MG PO TABS
1.0000 | ORAL_TABLET | Freq: Once | ORAL | Status: AC
  Administered 2023-12-15: 1 via ORAL
  Filled 2023-12-15: qty 1

## 2023-12-15 NOTE — ED Triage Notes (Signed)
 Pt reports with left sided (by her defibrillator) chest pain (pressure and ache) that radiates to her left arm and back x 3 days. Pt is also having SHOB. Pt has swelling in her lower legs.

## 2023-12-15 NOTE — ED Provider Notes (Signed)
  Physical Exam  BP 98/62   Pulse (!) 56   Temp 97.6 F (36.4 C) (Oral)   Resp 20   Ht 5\' 5"  (1.651 m)   Wt 91.6 kg   LMP  (LMP Unknown)   SpO2 100%   BMI 33.60 kg/m   Physical Exam  Procedures  Procedures  ED Course / MDM    Medical Decision Making Amount and/or Complexity of Data Reviewed Labs: ordered. Radiology: ordered.  Risk Prescription drug management.   Melody Spurling, assumed care for this patient.  In brief 53 year old female who is here today for left-sided chest wall pain which been ongoing for 3 days.  Patient signed out pending troponin.  Patient second troponin not increased compared to prior.  Patient has new T wave inversions on her EKG compared to February of this past year.  I spoke with on-call cardiologist, Dr. Paulita Boss, who looked at the patient's EKG, prior heart cath reports and prior cardiology office notes.  He will the patient was appropriate for close outpatient follow-up and I agree with this assessment.  Will discharge patient.  Advanced heart failure team will reach out to the patient this week for an appointment.       Afton Horse T, DO 12/15/23 1635

## 2023-12-15 NOTE — ED Provider Notes (Signed)
 [place] EMERGENCY DEPARTMENT AT [facility] Provider Note   CSN: [id number] Arrival date & time: 12/15/23  1159     History {Add pertinent medical, surgical, social history, OB history to HPI:1} Chief Complaint  Patient presents with   Chest Pain   Shortness of Breath    Karen Lutz is a 53 y.o. female.  53 year old female with prior medical history as detailed below presents for evaluation.  Patient complains of persistent left-sided chest pain.  This has been constant for the last 3 days.  Patient reports that when she moves her left arm or takes a deep breath or twists her torso the pain is worse.  She denies fever.  She denies shortness of breath with this provider.  She denies cough.  She denies other complaint.  She did not take anything for the pain prior to coming in today.  Prior medical history includes heart failure with reduced ejection fraction (EF 35-40%), obesity status post bariatric surgery in November 2023 at [facility], history of cardiac arrest in 2024 secondary to V-fib, and placement of Biotronik ICD.      The history is provided by the patient.       Home Medications Prior to Admission medications   Medication Sig Start Date End Date Taking? Authorizing Provider  aspirin  81 MG chewable tablet Chew 1 tablet (81 mg total) by mouth daily. 11/10/22   Arrien, Curlee Doss, MD  carvedilol  (COREG ) 12.5 MG tablet Take 1 tablet (12.5 mg total) by mouth 2 (two) times daily with a meal. 06/10/23   Sabharwal, Aditya, DO  cyclobenzaprine  (FLEXERIL ) 10 MG tablet Take 0.5-1 tablets (5-10 mg total) by mouth at bedtime as needed for muscle spasms. 10/23/23   Rexie Catena, PA-C  dapagliflozin  propanediol (FARXIGA ) 10 MG TABS tablet Take 1 tablet (10 mg total) by mouth daily. 06/10/23   Sabharwal, Aditya, DO  ENTRESTO  49-51 MG TAKE 1 TABLET BY MOUTH TWICE A DAY 11/04/23   Sabharwal, Aditya, DO  fluticasone  (FLONASE ) 50 MCG/ACT nasal spray  Place 2 sprays into both nostrils daily. 06/13/23   Estill Hemming, DO  furosemide  (LASIX ) 40 MG tablet Take 1 tablet (40 mg total) by mouth daily. 06/17/23   Sabharwal, Aditya, DO  hydrocortisone  (ANUSOL -HC) 25 MG suppository Place 25 mg rectally as needed for hemorrhoids or anal itching.    [provider]  lidocaine  (LIDODERM ) 5 % Place 1 patch onto the skin daily. Apply one patch for up to 12 hours. Then remove patch for a full 12 hours before re-applying a new patch 10/23/23   Rexie Catena, PA-C  omeprazole  (PRILOSEC OTC) 20 MG tablet Take 20 mg by mouth daily. Check with Dr about getting prescription strength omperazole    [provider]  spironolactone  (ALDACTONE ) 25 MG tablet TAKE 1 TABLET BY MOUTH EVERY NIGHT AT BEDTIME 09/04/23   Sabharwal, Aditya, DO  terconazole  (TERAZOL 3 ) 80 MG vaginal suppository Place 80 mg vaginally as needed.    [provider]  albuterol  (VENTOLIN  HFA) 108 (90 Base) MCG/ACT inhaler Inhale 2 puffs into the lungs every 4 (four) hours as needed for wheezing or shortness of breath. Patient not taking: Reported on 10/30/2020 09/08/20 11/24/20  Reena Canning, NP      Allergies    Penicillins and Penicillin g    Review of Systems   Review of Systems  All other systems reviewed and are negative.   Physical Exam Updated Vital Signs BP 106/71 (BP  Location: Right Arm)   Pulse 68   Temp 97.6 F (36.4 C) (Oral)   Resp (!) 22   Ht 5\' 5"  (1.651 m)   Wt 91.6 kg   LMP  (LMP Unknown)   SpO2 100%   BMI 33.60 kg/m  Physical Exam Vitals and nursing note reviewed.  Constitutional:      General: She is not in acute distress.    Appearance: Normal appearance. She is well-developed.  HENT:     Head: Normocephalic and atraumatic.  Eyes:     Conjunctiva/sclera: Conjunctivae normal.     Pupils: Pupils are equal, round, and reactive to light.  Cardiovascular:     Rate and Rhythm: Normal rate and regular rhythm.     Heart sounds:  Normal heart sounds.  Pulmonary:     Effort: Pulmonary effort is normal. No respiratory distress.     Breath sounds: Normal breath sounds.  Abdominal:     General: There is no distension.     Palpations: Abdomen is soft.     Tenderness: There is no abdominal tenderness.  Musculoskeletal:        General: No deformity. Normal range of motion.     Cervical back: Normal range of motion and neck supple.  Skin:    General: Skin is warm and dry.  Neurological:     General: No focal deficit present.     Mental Status: She is alert and oriented to person, place, and time.     ED Results / Procedures / Treatments   Labs (all labs ordered are listed, but only abnormal results are displayed) Labs Reviewed  BASIC METABOLIC PANEL WITH GFR - Abnormal; Notable for the following components:      Result Value   Glucose, Bld 65 (*)    All other components within normal limits  CBC  BRAIN NATRIURETIC PEPTIDE  TROPONIN I (HIGH SENSITIVITY)    EKG EKG Interpretation Date/Time:  Sunday Dec 15 2023 12:09:27 EDT Ventricular Rate:  72 PR Interval:  190 QRS Duration:  108 QT Interval:  428 QTC Calculation: 469 R Axis:   -32  Text Interpretation: Sinus rhythm Left axis deviation Anteroseptal infarct, old Abnormal T, probable ischemia, widespread Confirmed by Angela Kell [phone]) on 12/15/2023 12:40:27 PM  Radiology No results found.  Procedures Procedures  {Document cardiac monitor, telemetry assessment procedure when appropriate:1}  Medications Ordered in ED Medications  HYDROcodone -acetaminophen  (NORCO/VICODIN) 5-325 MG per tablet 1 tablet (has no administration in time range)    ED Course/ Medical Decision Making/ A&P   {   Click here for ABCD2, HEART and other calculatorsREFRESH Note before signing :1}                              Medical Decision Making Amount and/or Complexity of Data Reviewed Labs: ordered. Radiology: ordered.  Risk Prescription drug  management.    Medical Screen Complete  This patient presented to the ED with complaint of CP.  This complaint involves an extensive number of treatment options. The initial differential diagnosis includes, but is not limited to, msk cp, acs, angina, metabolic abnormality etc.   This presentation is: Acute, Self-Limited, Previously Undiagnosed, Uncertain Prognosis, Complicated, Systemic Symptoms, and Threat to Life/Bodily Function  Patient is presenting with atypical left sided cp. Patient with significant prior cardiac history. Patient's pain appears to be reproduced with palpation.  Initial troponin is 90. Repeat Trop pending. BNP 81. CBC/BMP without acute abnormality.  CXR without acute pathology identified.  Oncoming EDP aware of case and need for disposition.   Additional history obtained:  External records from outside sources obtained and reviewed including prior ED visits and prior Inpatient records.    Problem List / ED Course:  Chest pain   Disposition:  After consideration of the diagnostic results and the patients response to treatment, I feel that the patent would benefit from completion of ED evaluation.    {Document critical care time when appropriate:1} {Document review of labs and clinical decision tools ie heart score, Chads2Vasc2 etc:1}  {Document your independent review of radiology images, and any outside records:1} {Document your discussion with family members, caretakers, and with consultants:1} {Document social determinants of health affecting pt's care:1} {Document your decision making why or why not admission, treatments were needed:1} Final Clinical Impression(s) / ED Diagnoses Final diagnoses:  Chest pain, unspecified type    Rx / DC Orders ED Discharge Orders     None

## 2023-12-15 NOTE — Discharge Instructions (Signed)
 While you are in the emergency room, you have blood work done that was normal.  Like we discussed, you had some changes on your EKG.  I spoke with our on-call cardiologist who will get you a appointment with your cardiologist this upcoming week.  Return to the emergency room if you develop any increased pain in your chest or difficulty with your breathing.

## 2023-12-16 ENCOUNTER — Telehealth (HOSPITAL_COMMUNITY): Payer: Self-pay

## 2023-12-16 NOTE — Progress Notes (Signed)
 ADVANCED HEART FAILURE CLINIC NOTE  Referring Physician: No ref. provider found  Primary Care: Patient, No Pcp Per Primary Cardiologist: Dr. Bruce Caper  HPI: Karen Lutz is a 53 y.o. female with history of heart failure with reduced ejection fraction, obesity s/p bariatric surgery in November 2023 at [facility] and a significant family history of early systolic heart failure.  Her cardiac history dates back to November 01, 2022 when she was found unresponsive by her boyfriend.  Her rhythm on arrival was ventricular fibrillation with an unknown downtime.  She underwent CPR for 23 minutes before ROSC.  On arrival she was found to be hypokalemic with a QTc of 511 ms with normalization after correction of hypokalemia.  She was intubated and placed on pressors with echo demonstrating EF of 25%, cardiac MRI with a EF of 22% and preserved RV function.  She had a Biotronik secondary prevention ICD placed was started on GDMT and discharged home 1 week later.   Interval history Today she returns for AHF follow up. She was seen in the ED yesterday with chest pain, HsTrop relatively stable. BNP WNL. Overall feeling ***. Denies palpitations, CP, dizziness, edema, or PND/Orthopnea. *** SOB. Appetite ok. No fever or chills. Weight at home *** pounds. Taking all medications. Denies ETOH, tobacco or drug use.   Activity level/exercise tolerance: NYHA IIb Orthopnea:  Sleeps on 2 pillows Paroxysmal noctural dyspnea:  no Chest pain/pressure:  no Orthostatic lightheadedness:  no Palpitations:  no Lower extremity edema:  no Presyncope/syncope:  no Cough:  no Current Outpatient Medications  Medication Sig Dispense Refill   aspirin  81 MG chewable tablet Chew 1 tablet (81 mg total) by mouth daily. 30 tablet 3   carvedilol  (COREG ) 12.5 MG tablet Take 1 tablet (12.5 mg total) by mouth 2 (two) times daily with a meal. 180 tablet 3   cyanocobalamin (VITAMIN B12) 1000 MCG/ML injection INJECT 1 ML  AS DIRECTED EVERY 30 DAYS     cyclobenzaprine  (FLEXERIL ) 10 MG tablet Take 0.5-1 tablets (5-10 mg total) by mouth at bedtime as needed for muscle spasms. (Patient not taking: Reported on 12/15/2023) 7 tablet 0   dapagliflozin  propanediol (FARXIGA ) 10 MG TABS tablet Take 1 tablet (10 mg total) by mouth daily. 30 tablet 11   ENTRESTO  49-51 MG TAKE 1 TABLET BY MOUTH TWICE A DAY 60 tablet 2   fluticasone  (FLONASE ) 50 MCG/ACT nasal spray Place 2 sprays into both nostrils daily. 16 g 6   furosemide  (LASIX ) 40 MG tablet Take 1 tablet (40 mg total) by mouth daily. 30 tablet 11   lidocaine  (LIDODERM ) 5 % Place 1 patch onto the skin daily. Apply one patch for up to 12 hours. Then remove patch for a full 12 hours before re-applying a new patch (Patient not taking: Reported on 12/15/2023) 30 patch 0   omeprazole  (PRILOSEC OTC) 20 MG tablet Take 20 mg by mouth daily. Check with Dr about getting prescription strength omperazole     spironolactone  (ALDACTONE ) 25 MG tablet TAKE 1 TABLET BY MOUTH EVERY NIGHT AT BEDTIME 30 tablet 3   topiramate (TOPAMAX) 25 MG tablet Take 25 mg by mouth 2 (two) times daily.     Vitamin D , Ergocalciferol , (DRISDOL) 1.25 MG (50000 UNIT) CAPS capsule Take 1 capsule by mouth once a week.     No current facility-administered medications for this visit.   PHYSICAL EXAM: There were no vitals filed for this visit.  General:  *** appearing.  No respiratory difficulty HEENT:  normal Neck: supple. JVD *** cm.  Cor: PMI nondisplaced. Regular rate & rhythm. No rubs, gallops or murmurs. Lungs: clear Abdomen: soft, nontender, nondistended. Good bowel sounds. Extremities: no cyanosis, clubbing, rash, edema  Neuro: alert & oriented x 3. Moves all 4 extremities w/o difficulty. Affect pleasant.   Wt Readings from Last 3 Encounters:  12/15/23 91.6 kg (201 lb 15.1 oz)  10/23/23 91.6 kg (202 lb)  09/26/23 91.5 kg (201 lb 12.8 oz)     DATA REVIEW  ECG: 18/24: Normal sinus rhythm as per my  personal interpretation  ECHO: 2/25: LVEF 35-40%.  LV with GHK.  G1 DD, LA mild/moderately dilated.  RV normal.  Mild MR. 10/12/22: LVEF 25%, grade 2 diastolic dysfunction, normal RV function as per my personal interpretation 02/22/23: LVEF 25%-30%, normal RV function.   CATH: 1.Normal coronaries 2. Elevated filling pressures.  3. Mild pulmonary venous hypertension.  4. Preserved cardiac output.  1 point  CMR: 11/07/22: 1.  Moderately dilated LV with global hypokinesis, EF 22%.  2.  Normal RV size with EF 50%.  3. No myocardial LGE, so no definitive evidence for prior MI, myocarditis, or infiltrative disease.  ASSESSMENT & PLAN:  Heart failure with reduced ejection fraction Etiology of HF: Nonischemic cardiomyopathy with cardiac MRI not demonstrating any infiltrative cardiomyopathy or LGE.  I spoke with her family while inpatient and they commented on significant family history of heart failure and multiple family members before the age of 22-60.  They may possibly have 1 family member with sudden cardiac arrest.  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  Seen by Dr. Hallie Levers; recommended clinical screening for children.  NYHA class / AHA Stage: NYHA II Volume status & Diuretics: Euvolemic continue Lasix  40 mg daily Vasodilators: Entresto  49/51 mg twice daily Beta-Blocker: Continue coreg  12.5mg  BID. *** MRA: Continue spironolactone  25mg  daily.  Repeat lab work today. *** Cardiometabolic: Farxiga  10 mg Devices therapies & Valvulopathies: Secondary to an ICD in place Advanced therapies: Repeat TTE 2/25 with mild improvement in LV function  2.  V-fib arrest -No etiology determined at this point.  EP following.  Hypokalemic on arrival during her arrest.   -JUP gene positive; associated with ARVD.  Screening recommended for family members  3.  Obesity -BMI 33, down from 38. 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   Follow up ***  Sheryl Donna  AGACNP-BC

## 2023-12-16 NOTE — Telephone Encounter (Signed)
 Called to confirm/remind patient of their appointment at the [facility] on 12/17/2023 8:20.   Appointment:   [] Confirmed  [] Left mess   [] No answer/No voice mail  [] VM Full/unable to leave message  [x] Phone not in service  Patient reminded to bring all medications and/or complete list.  Confirmed patient has transportation. Gave directions, instructed to utilize valet parking.

## 2023-12-17 ENCOUNTER — Ambulatory Visit (HOSPITAL_COMMUNITY)
Admission: RE | Admit: 2023-12-17 | Discharge: 2023-12-17 | Disposition: A | Discharge status: Home / Self Care | Source: Ambulatory Visit | Attending: Physician Assistant | Admitting: Physician Assistant

## 2023-12-17 ENCOUNTER — Encounter (HOSPITAL_COMMUNITY): Payer: Self-pay

## 2023-12-17 ENCOUNTER — Ambulatory Visit (HOSPITAL_COMMUNITY): Payer: Self-pay | Admitting: Internal Medicine

## 2023-12-17 ENCOUNTER — Other Ambulatory Visit (HOSPITAL_COMMUNITY): Payer: Self-pay

## 2023-12-17 VITALS — BP 106/60 | HR 71 | Ht 65.0 in | Wt 199.4 lb

## 2023-12-17 DIAGNOSIS — Z79899 Other long term (current) drug therapy: Secondary | ICD-10-CM | POA: Diagnosis not present

## 2023-12-17 DIAGNOSIS — R0601 Orthopnea: Secondary | ICD-10-CM | POA: Insufficient documentation

## 2023-12-17 DIAGNOSIS — R0789 Other chest pain: Secondary | ICD-10-CM | POA: Insufficient documentation

## 2023-12-17 DIAGNOSIS — Z95810 Presence of automatic (implantable) cardiac defibrillator: Secondary | ICD-10-CM | POA: Insufficient documentation

## 2023-12-17 DIAGNOSIS — I4901 Ventricular fibrillation: Secondary | ICD-10-CM

## 2023-12-17 DIAGNOSIS — R002 Palpitations: Secondary | ICD-10-CM | POA: Insufficient documentation

## 2023-12-17 DIAGNOSIS — Z9884 Bariatric surgery status: Secondary | ICD-10-CM | POA: Diagnosis not present

## 2023-12-17 DIAGNOSIS — E669 Obesity, unspecified: Secondary | ICD-10-CM | POA: Diagnosis not present

## 2023-12-17 DIAGNOSIS — R079 Chest pain, unspecified: Secondary | ICD-10-CM

## 2023-12-17 DIAGNOSIS — I5022 Chronic systolic (congestive) heart failure: Secondary | ICD-10-CM | POA: Diagnosis not present

## 2023-12-17 DIAGNOSIS — Z8674 Personal history of sudden cardiac arrest: Secondary | ICD-10-CM | POA: Insufficient documentation

## 2023-12-17 DIAGNOSIS — Z6833 Body mass index (BMI) 33.0-33.9, adult: Secondary | ICD-10-CM | POA: Insufficient documentation

## 2023-12-17 DIAGNOSIS — R9431 Abnormal electrocardiogram [ECG] [EKG]: Secondary | ICD-10-CM | POA: Insufficient documentation

## 2023-12-17 DIAGNOSIS — I428 Other cardiomyopathies: Secondary | ICD-10-CM | POA: Insufficient documentation

## 2023-12-17 DIAGNOSIS — Z8249 Family history of ischemic heart disease and other diseases of the circulatory system: Secondary | ICD-10-CM | POA: Insufficient documentation

## 2023-12-17 LAB — C-REACTIVE PROTEIN: CRP: 0.7 mg/dL (ref <1.0)

## 2023-12-17 LAB — TROPONIN I (HIGH SENSITIVITY): Troponin I (High Sensitivity): 30 ng/L — ABNORMAL HIGH (ref <18)

## 2023-12-17 NOTE — Patient Instructions (Addendum)
 Labs done today, your results will be available in MyChart, we will contact you for abnormal readings.  Your provider has ordered a stress test for you. You will be called to have this test arranged  Your physician recommends that you schedule a follow-up appointment in: 1 month.  If you have any questions or concerns before your next appointment please send us  a message through [place] or call our office at [phone].    TO LEAVE A MESSAGE FOR THE NURSE SELECT OPTION 2, PLEASE LEAVE A MESSAGE INCLUDING: YOUR NAME DATE OF BIRTH CALL BACK NUMBER REASON FOR CALL**this is important as we prioritize the call backs  YOU WILL RECEIVE A CALL BACK THE SAME DAY AS LONG AS YOU CALL BEFORE 4:00 PM  At the [facility], you and your health needs are our priority. As part of our continuing mission to provide you with exceptional heart care, we have created designated Provider Care Teams. These Care Teams include your primary Cardiologist (physician) and Advanced Practice Providers (APPs- Physician Assistants and Nurse Practitioners) who all work together to provide you with the care you need, when you need it.   You may see any of the following providers on your designated Care Team at your next follow up: Dr Jules Oar Dr Peder Bourdon Dr. Alwin Baars Dr. Arta Lark Amy Marijane Shoulders, NP Ruddy Corral, [place] [facility] [place], [place] Dennise Fitz, NP [place] Lee, NP Shawnee Dellen, NP Luster Salters, PharmD Bevely Brush, PharmD   Please be sure to bring in all your medications bottles to every appointment.    Thank you for choosing [facility]

## 2023-12-17 NOTE — Progress Notes (Signed)
 ReDS Vest / Clip - 12/17/23 0847       ReDS Vest / Clip   Station Marker C    Ruler Value 30    ReDS Value Range Low volume    ReDS Actual Value 33

## 2023-12-18 ENCOUNTER — Telehealth (HOSPITAL_COMMUNITY): Payer: Self-pay

## 2023-12-18 NOTE — Telephone Encounter (Signed)
 Front office called patient to schedule an ER f/u appt in the AHF Clinic. Patients telephone number was invalid and the call could not be completed.   Per Sherolyn Dixon, RN this patient needs an ER f/u appt with the APP Clinic in 1-2 weeks.

## 2023-12-23 NOTE — Addendum Note (Signed)
 Addended by: Edra Govern D on: 12/23/2023 11:21 AM   Modules accepted: Orders

## 2023-12-23 NOTE — Progress Notes (Signed)
 Remote ICD transmission.

## 2023-12-31 ENCOUNTER — Telehealth (HOSPITAL_COMMUNITY): Payer: Self-pay | Admitting: Pharmacy Technician

## 2023-12-31 ENCOUNTER — Other Ambulatory Visit (HOSPITAL_COMMUNITY): Payer: Self-pay

## 2023-12-31 NOTE — Telephone Encounter (Signed)
 Advanced Heart Failure Patient Advocate Encounter  Prior Authorization for Farxiga  has been approved.    PA# [id number] Effective dates: 12/17/23 through 12/30/24  Correne Dillon, CPhT

## 2023-12-31 NOTE — Telephone Encounter (Signed)
 Patient Advocate Encounter   Received notification from [facility] that prior authorization for Farxiga  is required.   PA submitted on CoverMyMeds Key [id number] Status is pending   Will continue to follow.

## 2024-01-07 DIAGNOSIS — Z9884 Bariatric surgery status: Secondary | ICD-10-CM | POA: Diagnosis not present

## 2024-01-07 DIAGNOSIS — E66811 Obesity, class 1: Secondary | ICD-10-CM | POA: Diagnosis not present

## 2024-01-08 ENCOUNTER — Other Ambulatory Visit (HOSPITAL_COMMUNITY): Payer: Self-pay | Admitting: Internal Medicine

## 2024-01-08 DIAGNOSIS — R079 Chest pain, unspecified: Secondary | ICD-10-CM

## 2024-01-13 ENCOUNTER — Other Ambulatory Visit (HOSPITAL_COMMUNITY)
Admission: RE | Admit: 2024-01-13 | Discharge: 2024-01-13 | Disposition: A | Discharge status: Home / Self Care | Source: Ambulatory Visit | Attending: Family Medicine | Admitting: Family Medicine

## 2024-01-13 ENCOUNTER — Ambulatory Visit (INDEPENDENT_AMBULATORY_CARE_PROVIDER_SITE_OTHER): Admitting: Family Medicine

## 2024-01-13 ENCOUNTER — Encounter: Payer: Self-pay | Admitting: Family Medicine

## 2024-01-13 ENCOUNTER — Ambulatory Visit (HOSPITAL_BASED_OUTPATIENT_CLINIC_OR_DEPARTMENT_OTHER)
Admission: RE | Admit: 2024-01-13 | Discharge: 2024-01-13 | Disposition: A | Discharge status: Home / Self Care | Source: Ambulatory Visit | Attending: Family Medicine | Admitting: Family Medicine

## 2024-01-13 VITALS — BP 82/48 | HR 62 | Temp 97.6°F | Resp 16 | Ht 65.0 in | Wt 193.6 lb

## 2024-01-13 DIAGNOSIS — M79672 Pain in left foot: Secondary | ICD-10-CM | POA: Insufficient documentation

## 2024-01-13 DIAGNOSIS — N898 Other specified noninflammatory disorders of vagina: Secondary | ICD-10-CM | POA: Diagnosis not present

## 2024-01-13 DIAGNOSIS — R87615 Unsatisfactory cytologic smear of cervix: Secondary | ICD-10-CM | POA: Diagnosis not present

## 2024-01-13 DIAGNOSIS — R350 Frequency of micturition: Secondary | ICD-10-CM | POA: Insufficient documentation

## 2024-01-13 DIAGNOSIS — Z23 Encounter for immunization: Secondary | ICD-10-CM | POA: Insufficient documentation

## 2024-01-13 DIAGNOSIS — M170 Bilateral primary osteoarthritis of knee: Secondary | ICD-10-CM | POA: Diagnosis not present

## 2024-01-13 DIAGNOSIS — H538 Other visual disturbances: Secondary | ICD-10-CM | POA: Insufficient documentation

## 2024-01-13 DIAGNOSIS — Z0000 Encounter for general adult medical examination without abnormal findings: Secondary | ICD-10-CM | POA: Diagnosis not present

## 2024-01-13 DIAGNOSIS — Z1159 Encounter for screening for other viral diseases: Secondary | ICD-10-CM | POA: Diagnosis not present

## 2024-01-13 DIAGNOSIS — M19072 Primary osteoarthritis, left ankle and foot: Secondary | ICD-10-CM | POA: Diagnosis not present

## 2024-01-13 DIAGNOSIS — M2012 Hallux valgus (acquired), left foot: Secondary | ICD-10-CM | POA: Diagnosis not present

## 2024-01-13 LAB — POC URINALSYSI DIPSTICK (AUTOMATED)
Bilirubin, UA: NEGATIVE
Blood, UA: NEGATIVE
Glucose, UA: POSITIVE — AB
Ketones, UA: NEGATIVE
Leukocytes, UA: NEGATIVE
Nitrite, UA: NEGATIVE
Protein, UA: NEGATIVE
Spec Grav, UA: 1.01 (ref 1.010–1.025)
Urobilinogen, UA: 0.2 U/dL
pH, UA: 5 (ref 5.0–8.0)

## 2024-01-13 NOTE — Patient Instructions (Signed)
 Preventive Care 16-52 Years Old, Female  Preventive care refers to lifestyle choices and visits with your health care provider that can promote health and wellness. Preventive care visits are also called wellness exams.  What can I expect for my preventive care visit?  Counseling  Your health care provider may ask you questions about your:  Medical history, including:  Past medical problems.  Family medical history.  Pregnancy history.  Current health, including:  Menstrual cycle.  Method of birth control.  Emotional well-being.  Home life and relationship well-being.  Sexual activity and sexual health.  Lifestyle, including:  Alcohol, nicotine or tobacco, and drug use.  Access to firearms.  Diet, exercise, and sleep habits.  Work and work Astronomer.  Sunscreen use.  Safety issues such as seatbelt and bike helmet use.  Physical exam  Your health care provider will check your:  Height and weight. These may be used to calculate your BMI (body mass index). BMI is a measurement that tells if you are at a healthy weight.  Waist circumference. This measures the distance around your waistline. This measurement also tells if you are at a healthy weight and may help predict your risk of certain diseases, such as type 2 diabetes and high blood pressure.  Heart rate and blood pressure.  Body temperature.  Skin for abnormal spots.  What immunizations do I need?    Vaccines are usually given at various ages, according to a schedule. Your health care provider will recommend vaccines for you based on your age, medical history, and lifestyle or other factors, such as travel or where you work.  What tests do I need?  Screening  Your health care provider may recommend screening tests for certain conditions. This may include:  Lipid and cholesterol levels.  Diabetes screening. This is done by checking your blood sugar (glucose) after you have not eaten for a while (fasting).  Pelvic exam and Pap test.  Hepatitis B test.  Hepatitis C  test.  HIV (human immunodeficiency virus) test.  STI (sexually transmitted infection) testing, if you are at risk.  Lung cancer screening.  Colorectal cancer screening.  Mammogram. Talk with your health care provider about when you should start having regular mammograms. This may depend on whether you have a family history of breast cancer.  BRCA-related cancer screening. This may be done if you have a family history of breast, ovarian, tubal, or peritoneal cancers.  Bone density scan. This is done to screen for osteoporosis.  Talk with your health care provider about your test results, treatment options, and if necessary, the need for more tests.  Follow these instructions at home:  Eating and drinking    Eat a diet that includes fresh fruits and vegetables, whole grains, lean protein, and low-fat dairy products.  Take vitamin and mineral supplements as recommended by your health care provider.  Do not drink alcohol if:  Your health care provider tells you not to drink.  You are pregnant, may be pregnant, or are planning to become pregnant.  If you drink alcohol:  Limit how much you have to 0-1 drink a day.  Know how much alcohol is in your drink. In the [place] one drink equals one 12 oz bottle of beer (355 mL), one 5 oz glass of wine (148 mL), or one 1 oz glass of hard liquor (44 mL).  Lifestyle  Brush your teeth every morning and night with fluoride toothpaste. Floss one time each day.  Exercise for at least  30 minutes 5 or more days each week.  Do not use any products that contain nicotine or tobacco. These products include cigarettes, chewing tobacco, and vaping devices, such as e-cigarettes. If you need help quitting, ask your health care provider.  Do not use drugs.  If you are sexually active, practice safe sex. Use a condom or other form of protection to prevent STIs.  If you do not wish to become pregnant, use a form of birth control. If you plan to become pregnant, see your health care provider for a  prepregnancy visit.  Take aspirin only as told by your health care provider. Make sure that you understand how much to take and what form to take. Work with your health care provider to find out whether it is safe and beneficial for you to take aspirin daily.  Find healthy ways to manage stress, such as:  Meditation, yoga, or listening to music.  Journaling.  Talking to a trusted person.  Spending time with friends and family.  Minimize exposure to UV radiation to reduce your risk of skin cancer.  Safety  Always wear your seat belt while driving or riding in a vehicle.  Do not drive:  If you have been drinking alcohol. Do not ride with someone who has been drinking.  When you are tired or distracted.  While texting.  If you have been using any mind-altering substances or drugs.  Wear a helmet and other protective equipment during sports activities.  If you have firearms in your house, make sure you follow all gun safety procedures.  Seek help if you have been physically or sexually abused.  What's next?  Visit your health care provider once a year for an annual wellness visit.  Ask your health care provider how often you should have your eyes and teeth checked.  Stay up to date on all vaccines.  This information is not intended to replace advice given to you by your health care provider. Make sure you discuss any questions you have with your health care provider.  Document Revised: 01/18/2021 Document Reviewed: 01/18/2021  Elsevier Patient Education  2024 [employer].

## 2024-01-13 NOTE — Assessment & Plan Note (Signed)
 Ghm utd Check labs  See AVS  Health Maintenance  Topic Date Due   Hepatitis C Screening  Never done   DTaP/Tdap/Td (1 - Tdap) Never done   Zoster Vaccines- Shingrix (1 of 2) Never done   Cervical Cancer Screening (HPV/Pap Cotest)  Never done   COVID-19 Vaccine (3 - Pfizer risk series) 01/08/2020   MAMMOGRAM  Never done   INFLUENZA VACCINE  03/06/2024   Colonoscopy  11/04/2031   Pneumococcal Vaccine 79-52 Years old  Completed   HIV Screening  Completed   HPV VACCINES  Aged Out   Meningococcal B Vaccine  Aged Out
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
## 2024-01-14 ENCOUNTER — Encounter: Payer: Self-pay | Admitting: Family Medicine

## 2024-01-14 ENCOUNTER — Ambulatory Visit: Payer: Self-pay | Admitting: Family Medicine
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
## 2024-01-15 ENCOUNTER — Encounter (HOSPITAL_COMMUNITY): Payer: Self-pay | Admitting: Cardiology

## 2024-01-15 ENCOUNTER — Ambulatory Visit (HOSPITAL_COMMUNITY)
Admission: RE | Admit: 2024-01-15 | Discharge: 2024-01-15 | Disposition: A | Discharge status: Home / Self Care | Source: Ambulatory Visit | Attending: Cardiology | Admitting: Cardiology

## 2024-01-15 ENCOUNTER — Other Ambulatory Visit: Payer: Self-pay | Admitting: Family Medicine

## 2024-01-15 ENCOUNTER — Encounter: Payer: Self-pay | Admitting: *Deleted

## 2024-01-15 VITALS — BP 118/72 | HR 72 | Ht 65.0 in | Wt 197.8 lb

## 2024-01-15 DIAGNOSIS — K5900 Constipation, unspecified: Secondary | ICD-10-CM | POA: Diagnosis not present

## 2024-01-15 DIAGNOSIS — N760 Acute vaginitis: Secondary | ICD-10-CM

## 2024-01-15 DIAGNOSIS — Z79899 Other long term (current) drug therapy: Secondary | ICD-10-CM | POA: Diagnosis not present

## 2024-01-15 DIAGNOSIS — R079 Chest pain, unspecified: Secondary | ICD-10-CM | POA: Insufficient documentation

## 2024-01-15 DIAGNOSIS — I5022 Chronic systolic (congestive) heart failure: Secondary | ICD-10-CM | POA: Insufficient documentation

## 2024-01-15 DIAGNOSIS — Z8249 Family history of ischemic heart disease and other diseases of the circulatory system: Secondary | ICD-10-CM | POA: Diagnosis not present

## 2024-01-15 DIAGNOSIS — I428 Other cardiomyopathies: Secondary | ICD-10-CM | POA: Insufficient documentation

## 2024-01-15 DIAGNOSIS — Z8674 Personal history of sudden cardiac arrest: Secondary | ICD-10-CM | POA: Insufficient documentation

## 2024-01-15 DIAGNOSIS — Z6833 Body mass index (BMI) 33.0-33.9, adult: Secondary | ICD-10-CM | POA: Diagnosis not present

## 2024-01-15 DIAGNOSIS — Z95810 Presence of automatic (implantable) cardiac defibrillator: Secondary | ICD-10-CM | POA: Diagnosis not present

## 2024-01-15 DIAGNOSIS — I493 Ventricular premature depolarization: Secondary | ICD-10-CM

## 2024-01-15 DIAGNOSIS — E669 Obesity, unspecified: Secondary | ICD-10-CM | POA: Insufficient documentation

## 2024-01-15 DIAGNOSIS — Z9884 Bariatric surgery status: Secondary | ICD-10-CM | POA: Diagnosis not present

## 2024-01-15 LAB — BASIC METABOLIC PANEL WITH GFR
Anion gap: 7 (ref 5–15)
BUN: 12 mg/dL (ref 6–20)
CO2: 24 mmol/L (ref 22–32)
Calcium: 9.7 mg/dL (ref 8.9–10.3)
Chloride: 108 mmol/L (ref 98–111)
Creatinine, Ser: 0.72 mg/dL (ref 0.44–1.00)
GFR, Estimated: >60 mL/min (ref >60)
Glucose, Bld: 75 mg/dL (ref 70–99)
Potassium: 3.9 mmol/L (ref 3.5–5.1)
Sodium: 139 mmol/L (ref 135–145)

## 2024-01-15 LAB — CERVICOVAGINAL ANCILLARY ONLY
Bacterial Vaginitis (gardnerella): POSITIVE — AB
Candida Glabrata: NEGATIVE
Candida Vaginitis: NEGATIVE
Chlamydia: NEGATIVE
Comment: NEGATIVE
Comment: NEGATIVE
Comment: NEGATIVE
Comment: NEGATIVE
Comment: NEGATIVE
Comment: NORMAL
Neisseria Gonorrhea: NEGATIVE
Trichomonas: NEGATIVE

## 2024-01-15 LAB — BRAIN NATRIURETIC PEPTIDE: B Natriuretic Peptide: 36.5 pg/mL (ref 0.0–100.0)

## 2024-01-15 MED ORDER — DAPAGLIFLOZIN PROPANEDIOL 10 MG PO TABS: 10.0000 mg | ORAL_TABLET | Freq: Every day | ORAL | 11 refills | Status: DC

## 2024-01-15 MED ORDER — ENTRESTO 49-51 MG PO TABS: 1.0000 | ORAL_TABLET | Freq: Two times a day (BID) | ORAL | 2 refills | Status: DC

## 2024-01-15 MED ORDER — FUROSEMIDE 40 MG PO TABS: 40.0000 mg | ORAL_TABLET | Freq: Every day | ORAL | 11 refills | Status: DC

## 2024-01-15 MED ORDER — CARVEDILOL 12.5 MG PO TABS: 12.5000 mg | ORAL_TABLET | Freq: Two times a day (BID) | ORAL | 3 refills | Status: DC

## 2024-01-15 MED ORDER — METRONIDAZOLE 500 MG PO TABS: 500.0000 mg | ORAL_TABLET | Freq: Three times a day (TID) | ORAL | 0 refills | Status: AC

## 2024-01-15 MED ORDER — SPIRONOLACTONE 25 MG PO TABS: 25.0000 mg | ORAL_TABLET | Freq: Every day | ORAL | 3 refills | Status: DC

## 2024-01-15 NOTE — Patient Instructions (Addendum)
 Medication Changes:  MEDICATIONS REFILLED   PLEASE GET SUPPOSITORY OVER THE COUNTER FOR CONSTIPATION   Lab Work:  Labs done today, your results will be available in MyChart, we will contact you for abnormal readings.  Follow-Up in: 3 MONTHS AS SCHEDULED WITH DR. Bruce Caper   At the [facility], you and your health needs are our priority. We have a designated team specialized in the treatment of Heart Failure. This Care Team includes your primary Heart Failure Specialized Cardiologist (physician), Advanced Practice Providers (APPs- Physician Assistants and Nurse Practitioners), and Pharmacist who all work together to provide you with the care you need, when you need it.   You may see any of the following providers on your designated Care Team at your next follow up:  Dr. Jules Oar Dr. Peder Bourdon Dr. Alwin Baars Dr. Judyth Nunnery Nieves Bars, NP Ruddy Corral, [place] [facility] [place], [place] Dennise Fitz, NP [place] Lee, NP Luster Salters, PharmD   Please be sure to bring in all your medications bottles to every appointment.   Need to Contact Us :  If you have any questions or concerns before your next appointment please send us  a message through [place] or call our office at [phone].    TO LEAVE A MESSAGE FOR THE NURSE SELECT OPTION 2, PLEASE LEAVE A MESSAGE INCLUDING: YOUR NAME DATE OF BIRTH CALL BACK NUMBER REASON FOR CALL**this is important as we prioritize the call backs  YOU WILL RECEIVE A CALL BACK THE SAME DAY AS LONG AS YOU CALL BEFORE 4:00 PM

## 2024-01-16 ENCOUNTER — Encounter: Payer: Self-pay | Admitting: Family Medicine

## 2024-01-16 ENCOUNTER — Ambulatory Visit: Payer: Self-pay

## 2024-01-16 ENCOUNTER — Telehealth: Admitting: Family Medicine

## 2024-01-16 DIAGNOSIS — J069 Acute upper respiratory infection, unspecified: Secondary | ICD-10-CM | POA: Diagnosis not present

## 2024-01-16 DIAGNOSIS — R051 Acute cough: Secondary | ICD-10-CM | POA: Diagnosis not present

## 2024-01-16 NOTE — Progress Notes (Signed)
 Virtual Visit Consent   Karen Lutz, you are scheduled for a virtual visit with a [facility] provider today. Just as with appointments in the office, your consent must be obtained to participate. Your consent will be active for this visit and any virtual visit you may have with one of our providers in the next 365 days. If you have a MyChart account, a copy of this consent can be sent to you electronically.  As this is a virtual visit, video technology does not allow for your provider to perform a traditional examination. This may limit your provider's ability to fully assess your condition. If your provider identifies any concerns that need to be evaluated in person or the need to arrange testing (such as labs, EKG, etc.), we will make arrangements to do so. Although advances in technology are sophisticated, we cannot ensure that it will always work on either your end or our end. If the connection with a video visit is poor, the visit may have to be switched to a telephone visit. With either a video or telephone visit, we are not always able to ensure that we have a secure connection.  By engaging in this virtual visit, you consent to the provision of healthcare and authorize for your insurance to be billed (if applicable) for the services provided during this visit. Depending on your insurance coverage, you may receive a charge related to this service.  I need to obtain your verbal consent now. Are you willing to proceed with your visit today? Karen Lutz has provided verbal consent on 01/16/2024 for a virtual visit (video or telephone). Lanetta Pion, NP  Date: 01/16/2024 3:47 PM   Virtual Visit via Video Note   I, Lanetta Pion, connected with  Karen Lutz  ([id number], March 14, 1971) on 01/16/24 at  3:45 PM EDT by a video-enabled telemedicine application and verified that I am speaking with the correct person using two identifiers.  Location: Patient: Virtual Visit Location  Patient: Home Provider: Virtual Visit Location Provider: Home Office   I discussed the limitations of evaluation and management by telemedicine and the availability of in person appointments. The patient expressed understanding and agreed to proceed.    History of Present Illness: Karen Lutz is a 53 y.o. who identifies as a female who was assigned female at birth, and is being seen today for cough post vaccine in office on 01/13/2024  Right after the vaccine of PNA prevnar 20 given on 01/13/24 Started same day with feeling poorly later that evening. Chills, cold hands and feet- reports not her normal Reports throat is scratchy, arm is sore at site of injection.  Cough has been consistent- some mucus production. Nasal passages are stuffy and drainage.  Denies chest pain, shortness of breath, fevers Has only tried cough drops     Problems:  Patient Active Problem List   Diagnosis Date Noted   Preventative health care 01/13/2024   Blurry vision, bilateral 01/13/2024   Urinary frequency 01/13/2024   Pain in left foot 01/13/2024   Need for hepatitis C screening test 01/13/2024   Cervicovaginal cytology specimen unsatisfactory 01/13/2024   Vaginal discharge 01/13/2024   Need for pneumococcal 20-valent conjugate vaccination 01/13/2024   Prediabetes 11/08/2022   Class 2 obesity 11/07/2022   Acute metabolic encephalopathy 11/07/2022   Chronic systolic heart failure (HCC) 11/05/2022   Acute HFrEF (heart failure with reduced ejection fraction) (HCC) 11/05/2022   Hyperglycemia 11/05/2022   Prolonged Q-T interval on ECG  11/05/2022   VF (ventricular fibrillation) (HCC) 11/05/2022   Cardiopulmonary arrest (HCC) 11/05/2022   Cardiac arrest (HCC) 11/01/2022   Hypokalemia 11/01/2022   Acute respiratory failure with hypoxia (HCC) 11/01/2022   Cardiogenic shock (HCC) 11/01/2022   Ventricular fibrillation (HCC) 11/01/2022   Visit for wound check 10/03/2020   OA (osteoarthritis) of knee  05/09/2020   Epigastric pain 07/03/2013   Anemia 07/03/2013   Migraine 07/03/2013   Mood disorder (HCC) 02/15/2011   Iron  deficiency anemia 02/15/2011   Uterine fibroid 02/15/2011   GERD (gastroesophageal reflux disease) 02/15/2011    Allergies:  Allergies  Allergen Reactions   Penicillins Rash    Has patient had a PCN reaction causing immediate rash, facial/tongue/throat swelling, SOB or lightheadedness with hypotension: unknown Has patient had a PCN reaction causing severe rash involving mucus membranes or skin necrosis: unknown Has patient had a PCN reaction that required hospitalization : yes Has patient had a PCN reaction occurring within the last 10 years: no If all of the above answers are NO, then may proceed with Cephalosporin use.    Penicillin G Other (See Comments)    Per patient, childhood allergy, reaction unknown.     Medications:  Current Outpatient Medications:    aspirin  81 MG chewable tablet, Chew 1 tablet (81 mg total) by mouth daily., Disp: 30 tablet, Rfl: 3   carvedilol  (COREG ) 12.5 MG tablet, Take 1 tablet (12.5 mg total) by mouth 2 (two) times daily with a meal., Disp: 180 tablet, Rfl: 3   cyanocobalamin (VITAMIN B12) 1000 MCG/ML injection, INJECT 1 ML AS DIRECTED EVERY 30 DAYS, Disp: , Rfl:    cyclobenzaprine  (FLEXERIL ) 10 MG tablet, Take 0.5-1 tablets (5-10 mg total) by mouth at bedtime as needed for muscle spasms., Disp: 7 tablet, Rfl: 0   dapagliflozin  propanediol (FARXIGA ) 10 MG TABS tablet, Take 1 tablet (10 mg total) by mouth daily., Disp: 30 tablet, Rfl: 11   fluticasone  (FLONASE ) 50 MCG/ACT nasal spray, Place 2 sprays into both nostrils daily., Disp: 16 g, Rfl: 6   furosemide  (LASIX ) 40 MG tablet, Take 1 tablet (40 mg total) by mouth daily., Disp: 30 tablet, Rfl: 11   lidocaine  (LIDODERM ) 5 %, Place 1 patch onto the skin daily. Apply one patch for up to 12 hours. Then remove patch for a full 12 hours before re-applying a new patch, Disp: 30 patch,  Rfl: 0   metroNIDAZOLE  (FLAGYL ) 500 MG tablet, Take 1 tablet (500 mg total) by mouth 3 (three) times daily for 7 days., Disp: 21 tablet, Rfl: 0   omeprazole  (PRILOSEC OTC) 20 MG tablet, Take 20 mg by mouth daily. Check with Dr about getting prescription strength omperazole, Disp: , Rfl:    sacubitril -valsartan  (ENTRESTO ) 49-51 MG, Take 1 tablet by mouth 2 (two) times daily., Disp: 60 tablet, Rfl: 2   spironolactone  (ALDACTONE ) 25 MG tablet, Take 1 tablet (25 mg total) by mouth at bedtime., Disp: 30 tablet, Rfl: 3   topiramate (TOPAMAX) 50 MG tablet, Take 50 mg by mouth., Disp: , Rfl:    Vitamin D , Ergocalciferol , (DRISDOL) 1.25 MG (50000 UNIT) CAPS capsule, Take 1 capsule by mouth once a week., Disp: , Rfl:    WEGOVY 0.25 MG/0.5ML SOAJ, SMARTSIG:0.5 Milliliter(s) SUB-Q Once a Week, Disp: , Rfl:   Observations/Objective: Patient is well-developed, well-nourished in no acute distress.  Resting comfortably at home.  Head is normocephalic, atraumatic.  No labored breathing.  Speech is clear and coherent with logical content.  Patient is alert and oriented  at baseline.    Assessment and Plan:   1. Acute cough (Primary)   2. Viral URI   Patient became upset when she was advised there was not a safe cough medication (that she would accept) for her cough that she reports is from a PNA vaccine she got on 6/9. She is a HF pt and has prolonged QT- her labs flagged for safety as well. Provider verified on up to date and with co worker on these as well. She refused tessalon  perles, inhaler and other options offered. She also refused to buy anything OTC since this was a illness caused by the PNA vaccine. She said,  I am just suppose to suffer When I advised she can go be seen in person, she started, yelling and getting upset and cut me off and said,  bye  bye bye and disconnected the call.  Trying to provide the safest treatment plan in a virtual setting, but she didnt want the options that were  safest for her.      Follow Up Instructions: I discussed the assessment and treatment plan with the patient. The patient was provided an opportunity to ask questions and all were answered. The patient agreed with the plan and demonstrated an understanding of the instructions.  A copy of instructions were sent to the patient via MyChart unless otherwise noted below.   The patient was advised to call back or seek an in-person evaluation if the symptoms worsen or if the condition fails to improve as anticipated.    Lanetta Pion, NP

## 2024-01-16 NOTE — Telephone Encounter (Signed)
 FYI Only or Action Required?: FYI only for provider  Patient was last seen in primary care on 01/13/2024 by Karen Lutz, Candida Chalk, DO. Called Nurse Triage reporting Cough. Symptoms began 4 days ago. Interventions attempted: OTC medications: cough drops. Symptoms are: persistent.  Triage Disposition: See Physician Within 24 Hours  Patient/caregiver understands and will follow disposition?: Yes            Copied From CRM [id number]. Reason for Triage: Patient called in regarding a previous shot that she got on 06/09 would like for a nurse to give her a callback because she is experiencing symptoms and was told she wasn't going to get any symptoms and now she is sick  Reason for Disposition  [1] Continuous (nonstop) coughing interferes with work or school AND [2] no improvement using cough treatment per Care Advice    Patient has not tried any OTC medications, however symptoms persist since 6/9 with greenish-yellow sputum.  Answer Assessment - Initial Assessment Questions 1. ONSET: When did the cough begin?      June 9th after Pneumonia vaccination 2. SEVERITY: How bad is the cough today?      Symptoms worsening since onset 3. SPUTUM: Describe the color of your sputum (none, dry cough; clear, white, yellow, green)     Yellowish Green  4. HEMOPTYSIS: Are you coughing up any blood? If so ask: How much? (flecks, streaks, tablespoons, etc.)     No  5. DIFFICULTY BREATHING: Are you having difficulty breathing? If Yes, ask: How bad is it? (e.g., mild, moderate, severe)    - MILD: No SOB at rest, mild SOB with walking, speaks normally in sentences, can lie down, no retractions, pulse < 100.    - MODERATE: SOB at rest, SOB with minimal exertion and prefers to sit, cannot lie down flat, speaks in phrases, mild retractions, audible wheezing, pulse 100-120.    - SEVERE: Very SOB at rest, speaks in single words, struggling to breathe, sitting hunched forward, retractions, pulse > 120       No  6. FEVER: Do you have a fever? If Yes, ask: What is your temperature, how was it measured, and when did it start?     No fever, hot and cold feeling 7. CARDIAC HISTORY: Do you have any history of heart disease? (e.g., heart attack, congestive heart failure)      CHF 8. LUNG HISTORY: Do you have any history of lung disease?  (e.g., pulmonary embolus, asthma, emphysema)     No   10. OTHER SYMPTOMS: Do you have any other symptoms? (e.g., runny nose, wheezing, chest pain)      Mild Wheezing, sore throat, runny nose    Sore throat, runny nose. Arm sore from injection site.  Protocols used: Cough - Acute Productive-A-AH

## 2024-01-16 NOTE — Patient Instructions (Signed)
 Karen Lutz, thank you for joining Karen Pion, NP for today's virtual visit.  While this provider is not your primary care provider (PCP), if your PCP is located in our provider database this encounter information will be shared with them immediately following your visit.   A [facility] MyChart account gives you access to today's visit and all your visits, tests, and labs performed at [facility]  click here if you don't have a [facility] MyChart account or go to [email or web address]  Consent: (Patient) Karen Lutz provided verbal consent for this virtual visit at the beginning of the encounter.  Current Medications:  Current Outpatient Medications:    aspirin  81 MG chewable tablet, Chew 1 tablet (81 mg total) by mouth daily., Disp: 30 tablet, Rfl: 3   carvedilol  (COREG ) 12.5 MG tablet, Take 1 tablet (12.5 mg total) by mouth 2 (two) times daily with a meal., Disp: 180 tablet, Rfl: 3   cyanocobalamin (VITAMIN B12) 1000 MCG/ML injection, INJECT 1 ML AS DIRECTED EVERY 30 DAYS, Disp: , Rfl:    cyclobenzaprine  (FLEXERIL ) 10 MG tablet, Take 0.5-1 tablets (5-10 mg total) by mouth at bedtime as needed for muscle spasms., Disp: 7 tablet, Rfl: 0   dapagliflozin  propanediol (FARXIGA ) 10 MG TABS tablet, Take 1 tablet (10 mg total) by mouth daily., Disp: 30 tablet, Rfl: 11   fluticasone  (FLONASE ) 50 MCG/ACT nasal spray, Place 2 sprays into both nostrils daily., Disp: 16 g, Rfl: 6   furosemide  (LASIX ) 40 MG tablet, Take 1 tablet (40 mg total) by mouth daily., Disp: 30 tablet, Rfl: 11   lidocaine  (LIDODERM ) 5 %, Place 1 patch onto the skin daily. Apply one patch for up to 12 hours. Then remove patch for a full 12 hours before re-applying a new patch, Disp: 30 patch, Rfl: 0   metroNIDAZOLE  (FLAGYL ) 500 MG tablet, Take 1 tablet (500 mg total) by mouth 3 (three) times daily for 7 days., Disp: 21 tablet, Rfl: 0   omeprazole  (PRILOSEC OTC) 20 MG tablet, Take 20 mg by mouth daily.  Check with Dr about getting prescription strength omperazole, Disp: , Rfl:    sacubitril -valsartan  (ENTRESTO ) 49-51 MG, Take 1 tablet by mouth 2 (two) times daily., Disp: 60 tablet, Rfl: 2   spironolactone  (ALDACTONE ) 25 MG tablet, Take 1 tablet (25 mg total) by mouth at bedtime., Disp: 30 tablet, Rfl: 3   topiramate (TOPAMAX) 50 MG tablet, Take 50 mg by mouth., Disp: , Rfl:    Vitamin D , Ergocalciferol , (DRISDOL) 1.25 MG (50000 UNIT) CAPS capsule, Take 1 capsule by mouth once a week., Disp: , Rfl:    WEGOVY 0.25 MG/0.5ML SOAJ, SMARTSIG:0.5 Milliliter(s) SUB-Q Once a Week, Disp: , Rfl:    Medications ordered in this encounter:  No orders of the defined types were placed in this encounter.    *If you need refills on other medications prior to your next appointment, please contact your pharmacy*  Follow-Up: Call back or seek an in-person evaluation if the symptoms worsen or if the condition fails to improve as anticipated.  [facility] [phone]  Other Instructions    If you have been instructed to have an in-person evaluation today at a local Urgent Care facility, please use the link below. It will take you to a list of all of our available [facility], including address, phone number and hours of operation. Please do not delay care.  [facility]  If you or a family member  do not have a primary care provider, use the link below to schedule a visit and establish care. When you choose a [facility] primary care physician or advanced practice provider, you gain a long-term partner in health. Find a Primary Care Provider  Learn more about [facility] in-office and virtual care options: [facility] - Get Care Now

## 2024-01-20 ENCOUNTER — Telehealth (HOSPITAL_COMMUNITY): Payer: Self-pay | Admitting: *Deleted

## 2024-01-20 ENCOUNTER — Other Ambulatory Visit: Payer: Self-pay | Admitting: Family Medicine

## 2024-01-20 DIAGNOSIS — J0140 Acute pansinusitis, unspecified: Secondary | ICD-10-CM

## 2024-01-20 NOTE — Telephone Encounter (Signed)
 Patient given reminder call with instructions for upcoming ETT on 01/28/24

## 2024-01-20 NOTE — Progress Notes (Signed)
Patient notified of results and prescription.

## 2024-01-28 ENCOUNTER — Ambulatory Visit: Admitting: Orthopaedic Surgery

## 2024-01-28 ENCOUNTER — Ambulatory Visit (HOSPITAL_COMMUNITY): Admission: RE | Admit: 2024-01-28 | Source: Ambulatory Visit

## 2024-01-29 ENCOUNTER — Encounter (HOSPITAL_COMMUNITY): Payer: Self-pay | Admitting: Internal Medicine

## 2024-02-10 ENCOUNTER — Ambulatory Visit: Payer: BLUE CROSS/BLUE SHIELD

## 2024-02-10 DIAGNOSIS — I469 Cardiac arrest, cause unspecified: Secondary | ICD-10-CM

## 2024-02-10 LAB — CUP PACEART REMOTE DEVICE CHECK
Date Time Interrogation Session: 20250707115636
Implantable Lead Connection Status: 753985
Implantable Lead Implant Date: 20240405
Implantable Lead Location: 753860
Implantable Lead Model: 436909
Implantable Lead Serial Number: 81561050
Implantable Pulse Generator Implant Date: 20240405
Pulse Gen Model: 429525
Pulse Gen Serial Number: 84953112

## 2024-02-11 ENCOUNTER — Other Ambulatory Visit: Payer: Self-pay | Admitting: Family Medicine

## 2024-02-11 DIAGNOSIS — J0140 Acute pansinusitis, unspecified: Secondary | ICD-10-CM

## 2024-02-15 ENCOUNTER — Ambulatory Visit: Payer: Self-pay | Admitting: Cardiology

## 2024-02-19 DIAGNOSIS — H5213 Myopia, bilateral: Secondary | ICD-10-CM | POA: Diagnosis not present

## 2024-02-25 ENCOUNTER — Emergency Department (HOSPITAL_BASED_OUTPATIENT_CLINIC_OR_DEPARTMENT_OTHER)
Admission: EM | Admit: 2024-02-25 | Discharge: 2024-02-25 | Disposition: A | Discharge status: Home / Self Care | Attending: Emergency Medicine | Admitting: Emergency Medicine

## 2024-02-25 ENCOUNTER — Encounter (HOSPITAL_BASED_OUTPATIENT_CLINIC_OR_DEPARTMENT_OTHER): Payer: Self-pay | Admitting: *Deleted

## 2024-02-25 ENCOUNTER — Other Ambulatory Visit: Payer: Self-pay

## 2024-02-25 ENCOUNTER — Ambulatory Visit: Admitting: Physician Assistant

## 2024-02-25 ENCOUNTER — Telehealth: Payer: Self-pay | Admitting: Family Medicine

## 2024-02-25 DIAGNOSIS — N898 Other specified noninflammatory disorders of vagina: Secondary | ICD-10-CM | POA: Diagnosis not present

## 2024-02-25 DIAGNOSIS — Z7982 Long term (current) use of aspirin: Secondary | ICD-10-CM | POA: Diagnosis not present

## 2024-02-25 DIAGNOSIS — I10 Essential (primary) hypertension: Secondary | ICD-10-CM | POA: Diagnosis not present

## 2024-02-25 DIAGNOSIS — L292 Pruritus vulvae: Secondary | ICD-10-CM | POA: Diagnosis not present

## 2024-02-25 LAB — URINALYSIS, ROUTINE W REFLEX MICROSCOPIC
Glucose, UA: >=500 mg/dL — AB
Hgb urine dipstick: NEGATIVE
Ketones, ur: NEGATIVE mg/dL
Leukocytes,Ua: NEGATIVE
Nitrite: NEGATIVE
Protein, ur: NEGATIVE mg/dL
Specific Gravity, Urine: 1.025 (ref 1.005–1.030)
pH: 6 (ref 5.0–8.0)

## 2024-02-25 LAB — PREGNANCY, URINE: Preg Test, Ur: NEGATIVE

## 2024-02-25 LAB — WET PREP, GENITAL
Clue Cells Wet Prep HPF POC: NONE SEEN
Sperm: NONE SEEN
Trich, Wet Prep: NONE SEEN
WBC, Wet Prep HPF POC: >=10 — AB (ref <10)
Yeast Wet Prep HPF POC: NONE SEEN

## 2024-02-25 LAB — URINALYSIS, MICROSCOPIC (REFLEX)

## 2024-02-25 MED ORDER — FLUCONAZOLE 150 MG PO TABS
150.0000 mg | ORAL_TABLET | Freq: Once | ORAL | Status: AC
  Administered 2024-02-25: 150 mg via ORAL
  Filled 2024-02-25: qty 1

## 2024-02-25 NOTE — ED Triage Notes (Signed)
 Here by POV from home for vaginal itching, d/c, and urinary frequency. Describes d/c as thick, white. Denies abd / back pain, malodor, rash, lesions, bleeding, NVD, or fever. Reports heart pt hx. Alert, NAD, calm, interactive. Steady gait.

## 2024-02-25 NOTE — Telephone Encounter (Signed)
 FYI: Pt came in for appt with Karen Lutz at 10:40 showed up at 10:54 saying it takes time to find a parking space and to be able to get up to the second floor, messaged amber to see if pt could still be seen. Amber stated she would have to reschedule. I offered pt the appt opening for this afternoon but pt denied got mad and left while stating she was going to find a new doctor.

## 2024-02-25 NOTE — ED Provider Notes (Signed)
 [facility] Provider Note   CSN: [id number] Arrival date & time: 02/25/24  1119     Patient presents with: Vaginal Itching   Karen Lutz is a 53 y.o. female who presents with concern for vaginal itching and white discharge for the past couple of days.  Reports she feels like she may have a yeast infection.  She also reports burning with urination, but no hematuria or increased frequency.  Denies any abdominal pain, fever or chills.  She does report being sexually active with 1 female partner, unsure if she has been exposed to STIs.  She also reports using a Dove body wash in the vaginal area which does contain a scent.    Vaginal Itching Pertinent negatives include no abdominal pain.       Prior to Admission medications   Medication Sig Start Date End Date Taking? Authorizing Provider  aspirin  81 MG chewable tablet Chew 1 tablet (81 mg total) by mouth daily. 11/10/22   Arrien, Elidia Sieving, MD  carvedilol  (COREG ) 12.5 MG tablet Take 1 tablet (12.5 mg total) by mouth 2 (two) times daily with a meal. 01/15/24   Sabharwal, Aditya, DO  cyanocobalamin (VITAMIN B12) 1000 MCG/ML injection INJECT 1 ML AS DIRECTED EVERY 30 DAYS    [provider]  cyclobenzaprine  (FLEXERIL ) 10 MG tablet Take 0.5-1 tablets (5-10 mg total) by mouth at bedtime as needed for muscle spasms. 10/23/23   Veta Palma, PA-C  dapagliflozin  propanediol (FARXIGA ) 10 MG TABS tablet Take 1 tablet (10 mg total) by mouth daily. 01/15/24   Sabharwal, Aditya, DO  fluticasone  (FLONASE ) 50 MCG/ACT nasal spray Place 2 sprays into both nostrils daily. 01/20/24   Antonio Cyndee Jamee JONELLE, DO  furosemide  (LASIX ) 40 MG tablet Take 1 tablet (40 mg total) by mouth daily. 01/15/24   Sabharwal, Aditya, DO  lidocaine  (LIDODERM ) 5 % Place 1 patch onto the skin daily. Apply one patch for up to 12 hours. Then remove patch for a full 12 hours before re-applying a new patch 10/23/23    Veta Palma, PA-C  omeprazole  (PRILOSEC OTC) 20 MG tablet Take 20 mg by mouth daily. Check with Dr about getting prescription strength omperazole    [provider]  sacubitril -valsartan  (ENTRESTO ) 49-51 MG Take 1 tablet by mouth 2 (two) times daily. 01/15/24   Sabharwal, Aditya, DO  spironolactone  (ALDACTONE ) 25 MG tablet Take 1 tablet (25 mg total) by mouth at bedtime. 01/15/24   Sabharwal, Aditya, DO  topiramate (TOPAMAX) 50 MG tablet Take 50 mg by mouth. 01/07/24   [provider]  Vitamin D , Ergocalciferol , (DRISDOL) 1.25 MG (50000 UNIT) CAPS capsule Take 1 capsule by mouth once a week. 10/03/23   [provider]  WEGOVY 0.25 MG/0.5ML SOAJ SMARTSIG:0.5 Milliliter(s) SUB-Q Once a Week    [provider]  albuterol  (VENTOLIN  HFA) 108 (90 Base) MCG/ACT inhaler Inhale 2 puffs into the lungs every 4 (four) hours as needed for wheezing or shortness of breath. Patient not taking: Reported on 10/30/2020 09/08/20 11/24/20  Teresa Shelba JONELLE, NP    Allergies: Penicillins and Penicillin g    Review of Systems  Constitutional:  Negative for fever.  Gastrointestinal:  Negative for abdominal pain.    Updated Vital Signs BP 98/67 (BP Location: Left Arm)   Pulse 65   Temp 97.7 F (36.5 C) (Oral)   Resp 16   Wt 81.2 kg   LMP  (LMP Unknown)   SpO2 99%  BMI 29.79 kg/m   Physical Exam Vitals and nursing note reviewed. Exam conducted with a chaperone present.  Constitutional:      Appearance: Normal appearance.  HENT:     Head: Atraumatic.  Pulmonary:     Effort: Pulmonary effort is normal.  Abdominal:     General: Abdomen is flat.     Palpations: Abdomen is soft.     Tenderness: There is no abdominal tenderness.  Genitourinary:    Comments: RN Carolynne present to chaperone pelvic exam  Patient without any external genital lesions or erythema Mild amount of white vaginal discharge. Slightly friable vaginal/cervix walls   Neurological:     General:  No focal deficit present.     Mental Status: She is alert.  Psychiatric:        Mood and Affect: Mood normal.        Behavior: Behavior normal.     (all labs ordered are listed, but only abnormal results are displayed) Labs Reviewed  WET PREP, GENITAL - Abnormal; Notable for the following components:      Result Value   WBC, Wet Prep HPF POC >=10 (*)    All other components within normal limits  URINALYSIS, ROUTINE W REFLEX MICROSCOPIC - Abnormal; Notable for the following components:   Glucose, UA >=500 (*)    Bilirubin Urine SMALL (*)    All other components within normal limits  URINALYSIS, MICROSCOPIC (REFLEX) - Abnormal; Notable for the following components:   Bacteria, UA RARE (*)    All other components within normal limits  PREGNANCY, URINE  GC/CHLAMYDIA PROBE AMP ([place]) NOT AT [facility]    EKG: EKG Interpretation Date/Time:  Tuesday February 25 2024 12:59:36 EDT Ventricular Rate:  56 PR Interval:  229 QRS Duration:  111 QT Interval:  435 QTC Calculation: 420 R Axis:   -26  Text Interpretation: Sinus rhythm Prolonged PR interval Anterior infarct, old deep inverted t waves no longer evident Otherwise no significant change Confirmed by Emil Share [phone]) on 02/25/2024 1:01:51 PM  Radiology: No results found.   Procedures   Medications Ordered in the ED  fluconazole  (DIFLUCAN ) tablet 150 mg (has no administration in time range)                                    Medical Decision Making Amount and/or Complexity of Data Reviewed Labs: ordered.  Risk Prescription drug management.     Differential diagnosis includes but is not limited to UTI, pyelonephritis, STI, BV, contact dermatitis, pregnancy  ED Course:  Upon initial evaluation, patient is well-appearing, stable vital signs.  Abdomen soft nontender to palpation.  On pelvic exam with RN chaperone present, patient without any external lesions or erythema.  Vaginal canal with moderate amount of white  discharge.  Wet prep negative for yeast, trichomoniasis, clue cells.  Urinalysis without any nitrates or leukocytes.  Pregnancy test is negative.  Gonorrhea and Chlamydia testing is pending. She does not have any known exposure to STIs.  No indication for prophylactic treatment at this time.  No cervical motion tenderness.  No tachycardia or fevers, no concern for PID. I discussed the results with the patient.  Patient is adamant that she has a yeast infection as this feels similar to previous yeast infections with the white discharge and itching.  Patient has history of prolonged QT, and patient's EKG was checked which showed QTc within normal limits.  She  was given a dose of fluconazole  here. She did report using some scented body soap in the vaginal area.  Question if this could be a contact dermatitis.  However, no external rash noted at this time.  Patient is stable and appropriate for discharge home.  Labs Ordered: I Ordered, and personally interpreted labs.  The pertinent results include:   Urinalysis with large amount of glucose, but no nitrates or leukocytes.  Pregnancy test negative Wet prep with no yeast, trichomoniasis, or clue cells seen.  White blood cells present. GC pending   Cardiac Monitoring: / EKG: The patient was maintained on a cardiac monitor.  I personally viewed and interpreted the cardiac monitored which showed an underlying rhythm of: Normal sinus rhythm with QTc of 420   Medications Given: Fluconazole   Impression: Vaginal itching  Disposition:  The patient was discharged home with instructions to follow-up with PCP if symptoms not improved within the next 3 days.  She understands that she will be contacted if her gonorrhea and/or chlamydia test is positive.  She understands that both her and her partner would need to go undergo treatment and refrain from sexual contact until they both completed a full course of treatment if she has an STI.  Return precautions  given.    This chart was dictated using voice recognition software, Dragon. Despite the best efforts of this provider to proofread and correct errors, errors may still occur which can change documentation meaning.       Final diagnoses:  Vaginal itching    ED Discharge Orders     None          Veta Palma, PA-C 02/25/24 1307    Emil Share, DO 02/25/24 1318

## 2024-02-25 NOTE — Discharge Instructions (Addendum)
 It is unclear as to the cause of your vaginal itching and discharge. You tested negative for a yeast infection. However, since you felt this was similar to previous yeast infections, you were given a dose of a medication here (fluconazole ) to clear up a potential yeast infection.   Your testing for bacterial vaginosis and trichomoniasis was also negative. Your urine did not show any signs of a UTI.   Your gonorrhea and chlamydia testing is in process. If it is positive, you will be contacted to undergo treatment. If it is positive, both you and your sexual partner(s) will need to undergo a full course of treatment before resuming sexual activity.   Please avoid using any soaps inside the vagina or washing inside the vagina. Only use soaps that are non-scented to prevent skin irritation.  If your symptoms do not improve within the next 3 days, please follow-up with your PCP.  Return to the ER for any abdominal pain, fever, chills, any other new or concerning symptoms

## 2024-02-25 NOTE — ED Notes (Signed)
 D/c paperwork reviewed with pt, including follow up care.  All questions and/or concerns addressed at time of d/c.  No further needs expressed. . Pt verbalized understanding, Ambulatory without assistance to ED exit, NAD.

## 2024-02-26 LAB — GC/CHLAMYDIA PROBE AMP (~~LOC~~) NOT AT ARMC
Chlamydia: NEGATIVE
Comment: NEGATIVE
Comment: NORMAL
Neisseria Gonorrhea: NEGATIVE

## 2024-03-11 NOTE — Progress Notes (Deleted)
  Electrophysiology Office Follow up Visit Note:    Date:  03/11/2024   ID:  Karen Lutz, DOB 02-Aug-1971, MRN [id number]  PCP:  Antonio Meth, Jamee SAUNDERS, DO  [facility] Cardiologist:  None  [facility] Electrophysiologist:  OLE ONEIDA HOLTS, MD    Interval History:     Karen Lutz is a 53 y.o. female who presents for a follow up visit.   She had a defibrillator implanted November 09, 2023. Recent remote interrogations have shown stable device function.       Past medical, surgical, social and family history were reviewed.  ROS:   Please see the history of present illness.    All other systems reviewed and are negative.  EKGs/Labs/Other Studies Reviewed:    The following studies were reviewed today:  March 12, 2024 in-clinic device interrogation personally reviewed ***        Physical Exam:    VS:  LMP  (LMP Unknown)     Wt Readings from Last 3 Encounters:  02/25/24 179 lb (81.2 kg)  01/15/24 197 lb 12.8 oz (89.7 kg)  01/13/24 193 lb 9.6 oz (87.8 kg)     GEN: no distress CARD: RRR, No MRG.  ICD pocket well-healed RESP: No IWOB. CTAB.      ASSESSMENT:    No diagnosis found. PLAN:    In order of problems listed above:  #Chronic systolic heart failure Continue spironolactone , Entresto , Lasix , Farxiga , Coreg  Follows with heart failure clinic NYHA class II today.   #ICD in situ Device functioning appropriately.  Continue remote monitoring  Follow-up with EP APP in 1 year.   Signed, OLE HOLTS, MD, [facility], [facility] 03/11/2024 8:46 PM    Electrophysiology [facility]

## 2024-03-12 ENCOUNTER — Ambulatory Visit: Attending: Cardiology | Admitting: Cardiology

## 2024-03-13 ENCOUNTER — Encounter: Payer: Self-pay | Admitting: Cardiology

## 2024-03-17 ENCOUNTER — Other Ambulatory Visit: Payer: Self-pay | Admitting: Family Medicine

## 2024-03-17 DIAGNOSIS — J0140 Acute pansinusitis, unspecified: Secondary | ICD-10-CM

## 2024-03-24 DIAGNOSIS — H5213 Myopia, bilateral: Secondary | ICD-10-CM | POA: Diagnosis not present

## 2024-03-25 ENCOUNTER — Telehealth (HOSPITAL_COMMUNITY): Payer: Self-pay

## 2024-03-25 ENCOUNTER — Other Ambulatory Visit (HOSPITAL_COMMUNITY): Payer: Self-pay

## 2024-03-25 NOTE — Telephone Encounter (Signed)
 Advanced Heart Failure Patient Advocate Encounter  Received notification that prior authorization is needed for sacubitril -valsartan . Test billing shows that this plan prefers Entresto , $4 copay for 90 days. Contacted pharmacy to reprocess medication, confirmed copay. Informed patient by phone.  Rachel DEL, CPhT Rx Patient Advocate Phone: [phone]

## 2024-04-08 NOTE — Progress Notes (Incomplete)
 ADVANCED HEART FAILURE CLINIC NOTE  Referring Physician: Antonio Cyndee Jamee JONELLE, *  Primary Care: Antonio Cyndee Jamee JONELLE, DO Primary Cardiologist: Dr. Gardenia  CC: Heart failure with reduced ejection fraction  HPI: Karen Lutz is a 53 y.o. female with history of heart failure with reduced action fraction, obesity status post bariatric surgery in November 2023 at [facility] and a significant family history of early systolic heart failure that presents today to establish care.  Her cardiac history dates back to November 01, 2022 when she was found unresponsive by her boyfriend.  Her rhythm on arrival was ventricular fibrillation with an unknown downtime.  She underwent CPR for 23 minutes before ROSC.  On arrival she was found to be hypokalemic with a QTc of 511 ms with normalization after correction of hypokalemia.  She was intubated and placed on pressors with echocardiogram demonstrating EF of 25%, cardiac MRI with a EF of 22% and preserved RV function.  She had a Biotronik secondary prevention ICD placed was started on GDMT and discharged home 1 week later.   Interval history - She has been doing very well from a functional standpoint.  Has returned to work.  Repeat echocardiogram today with LVEF of 45 to 50%.  Current Outpatient Medications  Medication Sig Dispense Refill   carvedilol  (COREG ) 12.5 MG tablet Take 1 tablet (12.5 mg total) by mouth 2 (two) times daily with a meal. 180 tablet 3   cyanocobalamin (VITAMIN B12) 1000 MCG/ML injection INJECT 1 ML AS DIRECTED EVERY 30 DAYS     cyclobenzaprine  (FLEXERIL ) 10 MG tablet Take 0.5-1 tablets (5-10 mg total) by mouth at bedtime as needed for muscle spasms. 7 tablet 0   dapagliflozin  propanediol (FARXIGA ) 10 MG TABS tablet Take 1 tablet (10 mg total) by mouth daily. 30 tablet 11   fluticasone  (FLONASE ) 50 MCG/ACT nasal spray SPRAY 2 SPRAYS INTO EACH NOSTRIL EVERY DAY 48 mL 1   omeprazole  (PRILOSEC OTC) 20 MG tablet Take  20 mg by mouth daily. Check with Dr about getting prescription strength omperazole     sacubitril -valsartan  (ENTRESTO ) 49-51 MG Take 1 tablet by mouth 2 (two) times daily. 60 tablet 2   spironolactone  (ALDACTONE ) 25 MG tablet Take 1 tablet (25 mg total) by mouth at bedtime. 30 tablet 3   topiramate (TOPAMAX) 50 MG tablet Take 50 mg by mouth 2 (two) times daily.     Vitamin D , Ergocalciferol , (DRISDOL) 1.25 MG (50000 UNIT) CAPS capsule Take 1 capsule by mouth once a week.     WEGOVY 0.25 MG/0.5ML SOAJ SMARTSIG:0.5 Milliliter(s) SUB-Q Once a Week     furosemide  (LASIX ) 40 MG tablet Take 0.5 tablets (20 mg total) by mouth daily. 30 tablet 11   No current facility-administered medications for this encounter.   PHYSICAL EXAM: Vitals:   04/09/24 1200  BP: 102/64  Pulse: (!) 59  SpO2: 100%   GENERAL: NAD Lungs-normal work of breathing CARDIAC:  JVP: 6 cm          Normal rate with regular rhythm.  No murmur.  Pulses 2+.  No edema.  ABDOMEN: Soft, non-tender, non-distended.  EXTREMITIES: Warm and well perfused.  NEUROLOGIC: No obvious FND    DATA REVIEW  ECG: 18/24: Normal sinus rhythm as per my personal interpretation  ECHO: 10/12/22: LVEF 25%, grade 2 diastolic dysfunction, normal RV function as per my personal interpretation 02/22/23: LVEF 25%-30%, normal RV function.  04/09/24: LVEF 45-50%  CATH: 1.Normal coronaries 2. Elevated filling pressures.  3. Mild pulmonary venous hypertension.  4. Preserved cardiac output.  1 point  CMR: 11/07/22: 1.  Moderately dilated LV with global hypokinesis, EF 22%.  2.  Normal RV size with EF 50%.  3. No myocardial LGE, so no definitive evidence for prior MI, myocarditis, or infiltrative disease.  ASSESSMENT & PLAN:  Heart failure with reduced ejection fraction Etiology of HF: Nonischemic cardiomyopathy with cardiac MRI not demonstrating any infiltrative cardiomyopathy or LGE.  I spoke with her family while inpatient and they commented on  significant family history of heart failure and multiple family members before the age of 44-60.  They may possibly have 1 family member with sudden cardiac arrest.  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  Seen by Dr. Danford; recommended clinical screening for children.  NYHA class / AHA Stage: NYHA II Volume status & Diuretics: Euvolemic, decrease lasix  to 20mg  daily.  Vasodilators: Entresto  49/51 mg twice daily Beta-Blocker: continue coreg  to 12.5mg  BID.  FMJ:rnwupwlz spironolactone  to 25mg  daily.  Repeat BMP/BNP.  Cardiometabolic: Farxiga  10 mg Devices therapies & Valvulopathies: Secondary to an ICD in place Advanced therapies: Improvement in EF to 50% today.   2.  V-fib arrest -No etiology determined at this point.  EP following.  Hypokalemic on arrival during her arrest.   -JUP gene positive; associated with ARVD.  Screening recommended for family members  3.  Obesity -BMI 33, down from 38. 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 -Starting [facility] today.   4. Chest pain - Reports that her chest pain has now resolved.    5. Obesity  - Wegovy  I spent 30 minutes caring for this patient today including face to face time, ordering and reviewing labs, reviewing echocardiogram with her, seeing the patient, documenting in the record, and arranging follow ups.   Farah Lepak Advanced Heart Failure Mechanical Circulatory Support

## 2024-04-09 ENCOUNTER — Ambulatory Visit (HOSPITAL_COMMUNITY)
Admission: RE | Admit: 2024-04-09 | Discharge: 2024-04-09 | Disposition: A | Discharge status: Home / Self Care | Source: Ambulatory Visit | Attending: Cardiology | Admitting: Cardiology

## 2024-04-09 ENCOUNTER — Encounter (HOSPITAL_COMMUNITY): Payer: Self-pay | Admitting: Cardiology

## 2024-04-09 VITALS — BP 102/64 | HR 59 | Wt 184.0 lb

## 2024-04-09 DIAGNOSIS — I5022 Chronic systolic (congestive) heart failure: Secondary | ICD-10-CM

## 2024-04-09 DIAGNOSIS — I081 Rheumatic disorders of both mitral and tricuspid valves: Secondary | ICD-10-CM | POA: Insufficient documentation

## 2024-04-09 DIAGNOSIS — Z6833 Body mass index (BMI) 33.0-33.9, adult: Secondary | ICD-10-CM | POA: Insufficient documentation

## 2024-04-09 DIAGNOSIS — Z79899 Other long term (current) drug therapy: Secondary | ICD-10-CM | POA: Insufficient documentation

## 2024-04-09 DIAGNOSIS — E669 Obesity, unspecified: Secondary | ICD-10-CM | POA: Insufficient documentation

## 2024-04-09 DIAGNOSIS — I493 Ventricular premature depolarization: Secondary | ICD-10-CM

## 2024-04-09 DIAGNOSIS — Z95810 Presence of automatic (implantable) cardiac defibrillator: Secondary | ICD-10-CM | POA: Diagnosis not present

## 2024-04-09 DIAGNOSIS — R079 Chest pain, unspecified: Secondary | ICD-10-CM | POA: Diagnosis not present

## 2024-04-09 DIAGNOSIS — I428 Other cardiomyopathies: Secondary | ICD-10-CM | POA: Insufficient documentation

## 2024-04-09 DIAGNOSIS — Z7985 Long-term (current) use of injectable non-insulin antidiabetic drugs: Secondary | ICD-10-CM | POA: Diagnosis not present

## 2024-04-09 LAB — BASIC METABOLIC PANEL WITH GFR
Anion gap: 7 (ref 5–15)
BUN: 10 mg/dL (ref 6–20)
CO2: 28 mmol/L (ref 22–32)
Calcium: 9.5 mg/dL (ref 8.9–10.3)
Chloride: 107 mmol/L (ref 98–111)
Creatinine, Ser: 0.7 mg/dL (ref 0.44–1.00)
GFR, Estimated: >60 mL/min (ref >60)
Glucose, Bld: 84 mg/dL (ref 70–99)
Potassium: 3.9 mmol/L (ref 3.5–5.1)
Sodium: 142 mmol/L (ref 135–145)

## 2024-04-09 LAB — BRAIN NATRIURETIC PEPTIDE: B Natriuretic Peptide: 23.9 pg/mL (ref 0.0–100.0)

## 2024-04-09 LAB — ECHOCARDIOGRAM COMPLETE
Area-P 1/2: 3.77 cm2
S' Lateral: 3.5 cm
Single Plane A2C EF: 47.9 %

## 2024-04-09 MED ORDER — FUROSEMIDE 40 MG PO TABS: 20.0000 mg | ORAL_TABLET | Freq: Every day | ORAL | 11 refills | Status: DC

## 2024-04-09 NOTE — Progress Notes (Signed)
  Echocardiogram 2D Echocardiogram has been performed.  Koleen KANDICE Popper, RDCS 04/09/2024, 11:36 AM

## 2024-04-09 NOTE — Patient Instructions (Signed)
 Good to see you today!  STOP Aspirin   DECREASE Lasix  to 20 mg (1/2 tablet) Daily  Labs done today, your results will be available in MyChart, we will contact you for abnormal readings.  Your physician recommends that you schedule a follow-up appointment in: 3 months as scheduled  If you have any questions or concerns before your next appointment please send us  a message through [place] or call our office at [phone].    TO LEAVE A MESSAGE FOR THE NURSE SELECT OPTION 2, PLEASE LEAVE A MESSAGE INCLUDING: YOUR NAME DATE OF BIRTH CALL BACK NUMBER REASON FOR CALL**this is important as we prioritize the call backs  YOU WILL RECEIVE A CALL BACK THE SAME DAY AS LONG AS YOU CALL BEFORE 4:00 PM  At the Advanced Heart Failure Clinic, you and your health needs are our priority. As part of our continuing mission to provide you with exceptional heart care, we have created designated Provider Care Teams. These Care Teams include your primary Cardiologist (physician) and Advanced Practice Providers (APPs- Physician Assistants and Nurse Practitioners) who all work together to provide you with the care you need, when you need it.   You may see any of the following providers on your designated Care Team at your next follow up: Dr Toribio Fuel Dr Ezra Shuck Dr. Ria Commander Dr. Morene Brownie Amy Lenetta, NP Caffie Shed, [place] [facility], [place] Beckey Coe, NP [place] Lee, NP Ellouise Class, NP Tinnie Redman, PharmD Jaun Bash, PharmD   Please be sure to bring in all your medications bottles to every appointment.    Thank you for choosing [facility]-Advanced [facility] Failure [facility]

## 2024-04-14 ENCOUNTER — Ambulatory Visit: Admitting: Family Medicine

## 2024-04-14 DIAGNOSIS — E66811 Obesity, class 1: Secondary | ICD-10-CM | POA: Diagnosis not present

## 2024-04-14 DIAGNOSIS — Z9884 Bariatric surgery status: Secondary | ICD-10-CM | POA: Diagnosis not present

## 2024-04-16 ENCOUNTER — Other Ambulatory Visit (HOSPITAL_COMMUNITY): Payer: Self-pay | Admitting: Cardiology

## 2024-04-22 ENCOUNTER — Ambulatory Visit: Attending: Cardiology | Admitting: Pulmonary Disease

## 2024-04-22 NOTE — Progress Notes (Deleted)
  Electrophysiology Office Note:   Date:  04/22/2024  ID:  Karen Lutz, DOB 19-May-1971, MRN [id number]  Primary Cardiologist: None Primary Heart Failure: None Electrophysiologist: OLE ONEIDA HOLTS, MD   {Click to update primary MD,subspecialty MD or APP then REFRESH:1}    History of Present Illness:   Karen Lutz is a 53 y.o. female with h/o VF OOH cardiac arrest s/p ICD, NICM, GERD, bariatric surgery (duodenal switch), prior cocaine use seen today for routine electrophysiology followup.   Since last being seen in our clinic the patient reports doing ***.    She denies chest pain, palpitations, dyspnea, PND, orthopnea, nausea, vomiting, dizziness, syncope, edema, weight gain, or early satiety.   Review of systems complete and found to be negative unless listed in HPI.   EP Information / Studies Reviewed:    EKG is not ordered today. EKG from 02/25/24 reviewed which showed SR, prolonged PR , 56 bpm       ICD Interrogation-  reviewed in detail today,  See PACEART report.  Device History: Biotronik Single Chamber ICD implanted 11/09/22 for VF Arrest / NICM  History of appropriate therapy: No History of AAD therapy: No   Risk Assessment/Calculations:     No BP recorded.  {Refresh Note OR Click here to enter BP  :1}***        Physical Exam:   VS:  LMP  (LMP Unknown)    Wt Readings from Last 3 Encounters:  04/09/24 184 lb (83.5 kg)  02/25/24 179 lb (81.2 kg)  01/15/24 197 lb 12.8 oz (89.7 kg)     GEN: Well nourished, well developed in no acute distress NECK: No JVD; No carotid bruits CARDIAC: {EPRHYTHM:28826}, no murmurs, rubs, gallops RESPIRATORY:  Clear to auscultation without rales, wheezing or rhonchi  ABDOMEN: Soft, non-tender, non-distended EXTREMITIES:  No edema; No deformity   ASSESSMENT AND PLAN:    Chronic Systolic Dysfunction due to NICM s/p Biotronik single chamber ICD  OOH VF Arrest  PVC's Unclear etiology, hypokalemia on presentation. LVEF  recovered to 45-50%. JUP gene positive, associated with ARVD.  -euvolemic on exam / by device *** -Stable on an appropriate medical regimen -Normal ICD function -See Pace Art report -No changes today -follows with Dr. Gardenia with AHF Team   Disposition:   Follow up with Dr. HOLTS {EPFOLLOW LE:71826}   Signed, Daphne Barrack, NP-C, AGACNP-BC [facility] - Electrophysiology  04/22/2024, 8:19 AM

## 2024-04-24 ENCOUNTER — Other Ambulatory Visit (HOSPITAL_COMMUNITY): Payer: Self-pay

## 2024-04-27 ENCOUNTER — Other Ambulatory Visit (HOSPITAL_COMMUNITY): Payer: Self-pay

## 2024-05-11 ENCOUNTER — Ambulatory Visit: Payer: BLUE CROSS/BLUE SHIELD

## 2024-05-11 DIAGNOSIS — I493 Ventricular premature depolarization: Secondary | ICD-10-CM | POA: Diagnosis not present

## 2024-05-13 ENCOUNTER — Other Ambulatory Visit (HOSPITAL_COMMUNITY): Payer: Self-pay | Admitting: Cardiology

## 2024-05-13 LAB — CUP PACEART REMOTE DEVICE CHECK
Date Time Interrogation Session: 20251006112652
Implantable Lead Connection Status: 753985
Implantable Lead Implant Date: 20240405
Implantable Lead Location: 753860
Implantable Lead Model: 436909
Implantable Lead Serial Number: 81561050
Implantable Pulse Generator Implant Date: 20240405
Pulse Gen Model: 429525
Pulse Gen Serial Number: 84953112

## 2024-05-14 NOTE — Progress Notes (Signed)
 Remote ICD Transmission

## 2024-05-18 ENCOUNTER — Ambulatory Visit: Payer: Self-pay | Admitting: Cardiology

## 2024-05-18 NOTE — Progress Notes (Signed)
 Remote ICD Transmission

## 2024-06-08 ENCOUNTER — Encounter: Payer: Self-pay | Admitting: Radiology

## 2024-06-09 ENCOUNTER — Ambulatory Visit: Payer: Self-pay

## 2024-06-09 NOTE — Telephone Encounter (Signed)
 FYI Only or Action Required?: FYI only for provider: ED advised.  Patient was last seen in primary care on 01/16/2024 by Karen Chiquita HERO, NP.  Called Nurse Triage reporting Rectal Bleeding.  Symptoms began several weeks ago.  Interventions attempted: Prescription medications: Hydrocodone  for rectal pain.  Symptoms are: gradually worsening.  Triage Disposition: Go to ED Now (Notify PCP)  Patient/caregiver understands and will follow disposition?: Yes, pt will call to schedule follow up after discharge.     Copied from CRM #[id number]. Topic: Clinical - Red Word Triage >> Jun 09, 2024  9:23 AM Karen Lutz wrote: Red Word that prompted transfer to Nurse Triage: Rectal bleeding for the last 2 weeks. Extreme pain with bowel movements. Worsening.   ----------------------------------------------------------------------- From previous Reason for Contact - Scheduling: Patient/patient representative is calling to schedule an appointment. Refer to attachments for appointment information. Reason for Disposition  [1] MODERATE rectal bleeding (e.g., small blood clots, passing blood without stool, or toilet water turns red) AND [2] more than once a day  Answer Assessment - Initial Assessment Questions 1. APPEARANCE of BLOOD: What color is it? Is it passed separately, on the surface of the stool, or mixed in with the stool?      Blood mixed with stool, in the water 2. AMOUNT: How much blood was passed?      Pt notes it looks like she is on her cycle 3. FREQUENCY: How many times has blood been passed with the stools?      Every BM 4. ONSET: When was the blood first seen in the stools? (Days or weeks)      X 2 weeks 5. DIARRHEA: Is there also some diarrhea? If Yes, ask: How many diarrhea stools in the past 24 hours?      Watery stools,red appearance 6. CONSTIPATION: Do you have constipation? If Yes, ask: How bad is it?     None 8. BLOOD THINNERS: Do you take any blood thinners?  (e.g., aspirin , clopidogrel / Plavix, coumadin, heparin ). Notes: Other strong blood thinners include: Arixtra (fondaparinux), Eliquis (apixaban), Pradaxa (dabigatran), and Xarelto (rivaroxaban).     Daily ASA 9. OTHER SYMPTOMS: Do you have any other symptoms?  (e.g., abdomen pain, vomiting, dizziness, fever)     Rectal pain with more than 1 BM 15/10 pain, has been taking Norco for rectal pain PRN, dizziness  Protocols used: Rectal Bleeding-A-AH

## 2024-06-09 NOTE — Telephone Encounter (Signed)
FYI. Pt going to ED.  

## 2024-07-07 ENCOUNTER — Emergency Department (HOSPITAL_COMMUNITY)

## 2024-07-07 ENCOUNTER — Encounter (HOSPITAL_COMMUNITY): Payer: Self-pay

## 2024-07-07 ENCOUNTER — Emergency Department (HOSPITAL_COMMUNITY)
Admission: EM | Admit: 2024-07-07 | Discharge: 2024-07-07 | Disposition: A | Discharge status: Home / Self Care | Attending: Emergency Medicine | Admitting: Emergency Medicine

## 2024-07-07 DIAGNOSIS — M25461 Effusion, right knee: Secondary | ICD-10-CM | POA: Diagnosis not present

## 2024-07-07 DIAGNOSIS — M25561 Pain in right knee: Secondary | ICD-10-CM | POA: Diagnosis not present

## 2024-07-07 DIAGNOSIS — M1711 Unilateral primary osteoarthritis, right knee: Secondary | ICD-10-CM | POA: Diagnosis not present

## 2024-07-07 MED ORDER — DICLOFENAC SODIUM 1 % EX GEL: 2.0000 g | Freq: Four times a day (QID) | CUTANEOUS | 0 refills | Status: DC

## 2024-07-07 MED ORDER — HYDROCODONE-ACETAMINOPHEN 5-325 MG PO TABS
1.0000 | ORAL_TABLET | Freq: Once | ORAL | Status: AC
  Administered 2024-07-07: 1 via ORAL
  Filled 2024-07-07: qty 1

## 2024-07-07 NOTE — ED Triage Notes (Signed)
 Pt arrived via POV, c/o right knee pain and swelling. Denies any injury to area.

## 2024-07-07 NOTE — Discharge Instructions (Addendum)
 Make an appointment to follow-up with your orthopedist in [facility].  Return to the emergency room if you have any worsening symptoms.

## 2024-07-07 NOTE — ED Provider Notes (Signed)
 [place] EMERGENCY DEPARTMENT AT [facility] Provider Note   CSN: [id number] Arrival date & time: 07/07/24  1243     Patient presents with: Joint Swelling   Karen Lutz is a 53 y.o. female.   Patient is a 53 year old female who presents with pain in her right knee.  She says been hurting for the last couple weeks but she noticed some increased swelling over the last few days.  She denies any known injury.  She has had some intermittent pain in the knee for a while but never swelling like it currently is.  No fevers.       Prior to Admission medications   Medication Sig Start Date End Date Taking? Authorizing Provider  diclofenac Sodium  (VOLTAREN) 1 % GEL Apply 2 g topically 4 (four) times daily. 07/07/24  Yes Lenor Hollering, MD  carvedilol  (COREG ) 12.5 MG tablet Take 1 tablet (12.5 mg total) by mouth 2 (two) times daily with a meal. 01/15/24   Sabharwal, Aditya, DO  cyanocobalamin (VITAMIN B12) 1000 MCG/ML injection INJECT 1 ML AS DIRECTED EVERY 30 DAYS    [provider]  cyclobenzaprine  (FLEXERIL ) 10 MG tablet Take 0.5-1 tablets (5-10 mg total) by mouth at bedtime as needed for muscle spasms. 10/23/23   Veta Palma, PA-C  dapagliflozin  propanediol (FARXIGA ) 10 MG TABS tablet Take 1 tablet (10 mg total) by mouth daily. 01/15/24   Sabharwal, Aditya, DO  ENTRESTO  49-51 MG TAKE 1 TABLET BY MOUTH TWICE A DAY 04/16/24   Sabharwal, Aditya, DO  fluticasone  (FLONASE ) 50 MCG/ACT nasal spray SPRAY 2 SPRAYS INTO EACH NOSTRIL EVERY DAY 03/17/24   Antonio Meth, Yvonne R, DO  furosemide  (LASIX ) 40 MG tablet Take 0.5 tablets (20 mg total) by mouth daily. 04/09/24   Sabharwal, Aditya, DO  omeprazole  (PRILOSEC OTC) 20 MG tablet Take 20 mg by mouth daily. Check with Dr about getting prescription strength omperazole    [provider]  spironolactone  (ALDACTONE ) 25 MG tablet TAKE 1 TABLET BY MOUTH EVERYDAY AT BEDTIME 05/13/24   Sabharwal, Aditya, DO  topiramate (TOPAMAX)  50 MG tablet Take 50 mg by mouth 2 (two) times daily.    [provider]  Vitamin D , Ergocalciferol , (DRISDOL) 1.25 MG (50000 UNIT) CAPS capsule Take 1 capsule by mouth once a week. 10/03/23   [provider]  WEGOVY 0.25 MG/0.5ML SOAJ SMARTSIG:0.5 Milliliter(s) SUB-Q Once a Week    [provider]  albuterol  (VENTOLIN  HFA) 108 (90 Base) MCG/ACT inhaler Inhale 2 puffs into the lungs every 4 (four) hours as needed for wheezing or shortness of breath. Patient not taking: Reported on 10/30/2020 09/08/20 11/24/20  [name], NP    Allergies: Penicillins and Penicillin g    Review of Systems  Constitutional:  Negative for fever.  Gastrointestinal:  Negative for nausea and vomiting.  Musculoskeletal:  Positive for arthralgias and joint swelling. Negative for back pain and neck pain.  Skin:  Negative for wound.  Neurological:  Negative for weakness, numbness and headaches.    Updated Vital Signs BP 115/81 (BP Location: Left Arm)   Pulse 60   Temp 97.8 F (36.6 C) (Oral)   Resp 17   LMP  (LMP Unknown)   SpO2 100%   Physical Exam Constitutional:      Appearance: She is well-developed.  HENT:     Head: Normocephalic and atraumatic.  Cardiovascular:     Rate and Rhythm: Normal rate.  Pulmonary:     Effort: Pulmonary effort is  normal.  Musculoskeletal:        General: Tenderness present.     Cervical back: Normal range of motion and neck supple.     Comments: Patient has a moderate size effusion to the left knee.  There are some mild generalized tenderness.  No warmth or erythema.  No significant pain on range of motion of the knee.  She is able to do a straight leg raise.  No gross ligament instability.  No pain or swelling to the lower leg.  Pedal pulses intact.  No calf tenderness.  Skin:    General: Skin is warm and dry.  Neurological:     Mental Status: She is alert and oriented to person, place, and time.     (all labs ordered are listed, but only  abnormal results are displayed) Labs Reviewed - No data to display  EKG: None  Radiology: DG Knee Complete 4 Views Right Result Date: 07/07/2024 CLINICAL DATA:  knee pain, swelling EXAM: RIGHT KNEE - COMPLETE 4+ VIEW COMPARISON:  October 25, 2020 FINDINGS: Decreased bone mineralization.No acute fracture or dislocation. Moderate joint effusion. Moderate joint space loss of the patellofemoral and lateral compartments with mild medial compartment joint space loss. Tricompartmental osteophyte formation. Soft tissues are unremarkable. IMPRESSION: 1. No acute fracture or dislocation. 2. Similar moderate-sized joint effusion with moderate tricompartmental osteoarthritis of the knee. Electronically Signed   By: Rogelia Myers M.D.   On: 07/07/2024 17:49     Procedures   Medications Ordered in the ED  HYDROcodone -acetaminophen  (NORCO/VICODIN) 5-325 MG per tablet 1 tablet (has no administration in time range)                                    Medical Decision Making Amount and/or Complexity of Data Reviewed Radiology: ordered.  Risk Prescription drug management.   Patient is a 53 year old who presents with pain and swelling in her right knee.  Differential diagnosis includes knee effusion, fracture, septic joint, cellulitis  Patient presents with pain and swelling to the right knee.  She actually does not appear to have a lot of tenderness in the knee joint itself.  There is an effusion present.  There is no warmth or erythema.  No clinical concerns for septic arthritis.  She advised me initially that she had never had this happen before but when x-rays performed, they were compared to the images that were done in 2022 which had a similar appearance with a knee effusion.  X-rays do not reveal any fracture.  There is arthritic changes.  I reviewed her chart and it looks like she is followed by an orthopedist in [facility] for both of her knees.  Advised her to follow-up with her orthopedist if  her symptoms are improving.  Will apply Ace wrap.  Advised ice and elevation.  It appears that she has a history of advanced heart failure and cannot take NSAIDs.  Will place her on Voltaren  gel which she has used before.  She was discharged home in good condition.  Return precautions were given.     Final diagnoses:  Acute pain of right knee    ED Discharge Orders          Ordered    diclofenac  Sodium (VOLTAREN ) 1 % GEL  4 times daily        07/07/24 1826  Lenor Hollering, MD 07/07/24 [phone]

## 2024-07-08 ENCOUNTER — Ambulatory Visit (HOSPITAL_COMMUNITY)
Admission: EM | Admit: 2024-07-08 | Discharge: 2024-07-08 | Disposition: A | Discharge status: Home / Self Care | Attending: Internal Medicine | Admitting: Internal Medicine

## 2024-07-08 ENCOUNTER — Encounter (HOSPITAL_COMMUNITY): Payer: Self-pay

## 2024-07-08 DIAGNOSIS — M25461 Effusion, right knee: Secondary | ICD-10-CM | POA: Insufficient documentation

## 2024-07-08 MED ORDER — TRIAMCINOLONE ACETONIDE 40 MG/ML IJ SUSP
INTRAMUSCULAR | Status: AC
  Filled 2024-07-08: qty 1

## 2024-07-08 MED ORDER — LIDOCAINE HCL (PF) 2 % IJ SOLN
INTRAMUSCULAR | Status: AC
  Filled 2024-07-08: qty 5

## 2024-07-08 NOTE — ED Triage Notes (Signed)
 Pt c/o pain and swelling to rt knee x1wk. Denies injury. States seen in ER yesterday and they didn't drain it. States took vicodin last night with relief at that time.

## 2024-07-08 NOTE — ED Provider Notes (Signed)
 [facility]    CSN: [id number] Arrival date & time: 07/08/24  0957      History   Chief Complaint Chief Complaint  Patient presents with   Knee Pain    HPI Karen Lutz is a 53 y.o. female who presents urgent care endorsing a 1 week history of right knee pain and swelling.  She has known osteoarthritis.  Most recently followed by Dr. Conny with [facility] in [facility].  She states that the pain in her right knee worsened 2 weeks ago and she began to notice swelling around 1 week ago.  She presented to the emergency department at [facility] yesterday afternoon.  X-rays redemonstrated osteoarthritis, not significantly changed from prior films in 2022.  A moderate effusion was noted.  An Ace wrap was applied, Vicodin given, and it was recommended that she use Voltaren gel for pain relief .  Today she presents to urgent care stating that pain has not changed but she would like to have the fluid removed from her knee.  She denies noticing any erythema around her knee.  She denies systemic symptoms of fever/chills.   Past Medical History:  Diagnosis Date   Acid reflux    Anemia    Fibroids    Heart murmur    Knee pain    UTI (lower urinary tract infection)     Patient Active Problem List   Diagnosis Date Noted   Preventative health care 01/13/2024   Blurry vision, bilateral 01/13/2024   Urinary frequency 01/13/2024   Pain in left foot 01/13/2024   Need for hepatitis C screening test 01/13/2024   Cervicovaginal cytology specimen unsatisfactory 01/13/2024   Vaginal discharge 01/13/2024   Need for pneumococcal 20-valent conjugate vaccination 01/13/2024   Prediabetes 11/08/2022   Class 2 obesity 11/07/2022   Acute metabolic encephalopathy 11/07/2022   Chronic systolic heart failure (HCC) 11/05/2022   Acute HFrEF (heart failure with reduced ejection fraction) (HCC) 11/05/2022   Hyperglycemia 11/05/2022   Prolonged Q-T interval on ECG 11/05/2022   VF  (ventricular fibrillation) (HCC) 11/05/2022   Cardiopulmonary arrest (HCC) 11/05/2022   Cardiac arrest (HCC) 11/01/2022   Hypokalemia 11/01/2022   Acute respiratory failure with hypoxia (HCC) 11/01/2022   Cardiogenic shock (HCC) 11/01/2022   Ventricular fibrillation (HCC) 11/01/2022   Visit for wound check 10/03/2020   OA (osteoarthritis) of knee 05/09/2020   Epigastric pain 07/03/2013   Anemia 07/03/2013   Migraine 07/03/2013   Mood disorder 02/15/2011   Iron  deficiency anemia 02/15/2011   Uterine fibroid 02/15/2011   GERD (gastroesophageal reflux disease) 02/15/2011    Past Surgical History:  Procedure Laterality Date   ABDOMINAL HYSTERECTOMY     BARIATRIC SURGERY     [name] Single x1 week ago   ceaserian     CESAREAN SECTION     CESAREAN SECTION     ICD IMPLANT N/A 11/09/2022   Procedure: ICD IMPLANT;  Surgeon: Cindie Ole DASEN, MD;  Location: [facility] INVASIVE CV LAB;  Service: Cardiovascular;  Laterality: N/A;   KNEE SURGERY Left    KNEE SURGERY     RIGHT/LEFT HEART CATH AND CORONARY ANGIOGRAPHY N/A 11/06/2022   Procedure: RIGHT/LEFT HEART CATH AND CORONARY ANGIOGRAPHY;  Surgeon: Rolan Ezra RAMAN, MD;  Location: [facility] INVASIVE CV LAB;  Service: Cardiovascular;  Laterality: N/A;    OB History   No obstetric history on file.      Home Medications    Prior to Admission medications   Medication Sig Start Date End  Date Taking? Authorizing Provider  carvedilol  (COREG ) 12.5 MG tablet Take 1 tablet (12.5 mg total) by mouth 2 (two) times daily with a meal. 01/15/24   Sabharwal, Aditya, DO  cyanocobalamin (VITAMIN B12) 1000 MCG/ML injection INJECT 1 ML AS DIRECTED EVERY 30 DAYS    [provider]  dapagliflozin  propanediol (FARXIGA ) 10 MG TABS tablet Take 1 tablet (10 mg total) by mouth daily. 01/15/24   Sabharwal, Aditya, DO  diclofenac Sodium  (VOLTAREN) 1 % GEL Apply 2 g topically 4 (four) times daily. 07/07/24   Lenor Hollering, MD  ENTRESTO  49-51 MG TAKE 1 TABLET BY MOUTH  TWICE A DAY 04/16/24   Sabharwal, Aditya, DO  fluticasone  (FLONASE ) 50 MCG/ACT nasal spray SPRAY 2 SPRAYS INTO EACH NOSTRIL EVERY DAY 03/17/24   Antonio Meth, Yvonne R, DO  furosemide  (LASIX ) 40 MG tablet Take 0.5 tablets (20 mg total) by mouth daily. 04/09/24   Sabharwal, Aditya, DO  omeprazole  (PRILOSEC OTC) 20 MG tablet Take 20 mg by mouth daily. Check with Dr about getting prescription strength omperazole    [provider]  spironolactone  (ALDACTONE ) 25 MG tablet TAKE 1 TABLET BY MOUTH EVERYDAY AT BEDTIME 05/13/24   Sabharwal, Aditya, DO  topiramate (TOPAMAX) 50 MG tablet Take 50 mg by mouth 2 (two) times daily.    [provider]  Vitamin D , Ergocalciferol , (DRISDOL) 1.25 MG (50000 UNIT) CAPS capsule Take 1 capsule by mouth once a week. 10/03/23   [provider]  WEGOVY 0.25 MG/0.5ML SOAJ SMARTSIG:0.5 Milliliter(s) SUB-Q Once a Week    [provider]  albuterol  (VENTOLIN  HFA) 108 (90 Base) MCG/ACT inhaler Inhale 2 puffs into the lungs every 4 (four) hours as needed for wheezing or shortness of breath. Patient not taking: Reported on 10/30/2020 09/08/20 11/24/20  [name], NP    Family History Family History  Problem Relation Age of Onset   Alcohol abuse Mother    Heart disease Mother    Alcohol abuse Father    Heart disease Father    Seizures Sister    Arthritis Maternal Grandmother    Arthritis Maternal Grandfather    Arthritis Paternal Grandmother    Arthritis Paternal Grandfather    Hypertension Other    Cancer Other     Social History Social History   Tobacco Use   Smoking status: Never   Smokeless tobacco: Never  Vaping Use   Vaping status: Never Used  Substance Use Topics   Alcohol use: No   Drug use: Never     Allergies   Penicillins and Penicillin g   Review of Systems Review of Systems  Musculoskeletal:  Positive for joint swelling (Right knee).  All other systems reviewed and are negative.  Physical Exam Triage  Vital Signs ED Triage Vitals  Encounter Vitals Group     BP 07/08/24 1047 (!) 97/59     Girls Systolic BP Percentile --      Girls Diastolic BP Percentile --      Boys Systolic BP Percentile --      Boys Diastolic BP Percentile --      Pulse Rate 07/08/24 1047 67     Resp 07/08/24 1047 16     Temp 07/08/24 1047 97.6 F (36.4 C)     Temp Source 07/08/24 1047 Oral     SpO2 07/08/24 1047 98 %     Weight --      Height --      Head Circumference --  Peak Flow --      Pain Score 07/08/24 1045 8     Pain Loc --      Pain Education --      Exclude from Growth Chart --    No data found.  Updated Vital Signs BP (!) 97/59 (BP Location: Right Arm)   Pulse 67   Temp 97.6 F (36.4 C) (Oral)   Resp 16   LMP  (LMP Unknown)   SpO2 98%   Physical Exam Vitals and nursing note reviewed.  Constitutional:      General: She is not in acute distress.    Appearance: She is not ill-appearing or toxic-appearing.  Musculoskeletal:        General: Swelling present. No tenderness.     Comments: There is obvious effusion noted on inspection of the right knee.  No TTP.  ROM from 0-110 degrees of flexion.  No pain with extension.  There is no overlying erythema.  Normal ROM without pain at the right hip and ankle.  Skin:    General: Skin is warm and dry.     Findings: No erythema.  Neurological:     Mental Status: She is alert.    UC Treatments / Results  Labs (all labs ordered are listed, but only abnormal results are displayed) Labs Reviewed  BODY FLUID CULTURE W GRAM STAIN  SYNOVIAL CELL COUNT + DIFF, W/ CRYSTALS    EKG  Radiology DG Knee Complete 4 Views Right Result Date: 07/07/2024 CLINICAL DATA:  knee pain, swelling EXAM: RIGHT KNEE - COMPLETE 4+ VIEW COMPARISON:  October 25, 2020 FINDINGS: Decreased bone mineralization.No acute fracture or dislocation. Moderate joint effusion. Moderate joint space loss of the patellofemoral and lateral compartments with mild medial compartment  joint space loss. Tricompartmental osteophyte formation. Soft tissues are unremarkable. IMPRESSION: 1. No acute fracture or dislocation. 2. Similar moderate-sized joint effusion with moderate tricompartmental osteoarthritis of the knee. Electronically Signed   By: Rogelia Myers M.D.   On: 07/07/2024 17:49   Procedures Join Aspiration/Injection  Date/Time: 07/08/2024 12:09 PM  Performed by: Melvenia Manus BRAVO, MD Authorized by: Melvenia Manus BRAVO, MD   Consent:    Consent obtained:  Verbal   Consent given by:  Patient   Risks, benefits, and alternatives were discussed: yes     Risks discussed:  Bleeding, infection, pain and incomplete drainage   Alternatives discussed:  No treatment Universal protocol:    Patient identity confirmed:  Verbally with patient Location:    Location:  Knee   Knee:  R knee Anesthesia:    Anesthesia method:  Local infiltration   Local anesthetic:  Lidocaine  2% w/o epi Procedure details:    Preparation: Patient was prepped and draped in usual sterile fashion     Needle gauge:  18 G   Ultrasound guidance: yes     Approach:  Superior   Aspirate amount:  37 cc   Aspirate characteristics:  Yellow   Steroid injected: yes     Specimen collected: yes   Post-procedure details:    Dressing:  Adhesive bandage   Procedure completion:  Tolerated well, no immediate complications Comments:     37 cc of yellow synovial fluid were removed.  Procedure was completed under ultrasound guidance but pictures were not able to be saved to PACS.  After aspiration, a mixture of 3 cc 2% lidocaine  without epinephrine  and 1 cc triamcinolone  40 mg/mL was injected into the suprapatellar pouch under ultrasound guidance.  The patient tolerated the  procedure well without any immediate postprocedural complications.  (including critical care time)  Medications Ordered in UC Medications - No data to display  Initial Impression / Assessment and Plan / UC Course  I have reviewed the triage  vital signs and the nursing notes.  Pertinent labs & imaging results that were available during my care of the patient were reviewed by me and considered in my medical decision making (see chart for details).    The patient is a 53 year old female who presents urgent care endorsing acute on chronic right knee pain with development of effusion over the last week.  On exam there is obvious effusion without increased warmth or erythema.  No clinical signs of septic arthritis.  X-rays obtained yesterday (12/2) showed moderate joint effusion with tricompartmental osteoarthritis.  Patient returns urgent care requesting knee aspiration.  This was performed today under ultrasound guidance with approximately 37 cc of yellow synovial fluid removed.  Following aspiration a 3:1 preparation of lidocaine  and triamcinolone  was injected into the suprapatellar pouch.  Synovial fluid will be sent for cell count, Gram stain, culture, and crystal analysis.  I recommend that she follow-up with orthopedic surgery for ongoing management of osteoarthritis.  She reapplied an Ace wrap to her knee prior to leaving urgent care to augment fluid reaccumulation.  Patient is agreeable to the plan stated above and is medically stable for discharge at this time.  Final Clinical Impressions(s) / UC Diagnoses   Final diagnoses:  Effusion of right knee joint     Discharge Instructions      We drained 37 mL of fluid from your right knee and injected steroid to help with inflammation.  I recommend wearing an Ace wrap regularly to help with pain and avoid fluid reaccumulation.  Use Voltaren gel as needed.  I recommend following up with Dr. Conny for ongoing management of knee arthritis.  We will send the fluid from your knee for testing.      ED Prescriptions   None    PDMP not reviewed this encounter.   Melvenia Manus BRAVO, MD 07/08/24 [phone]

## 2024-07-08 NOTE — Discharge Instructions (Addendum)
 We drained 37 mL of fluid from your right knee and injected steroid to help with inflammation.  I recommend wearing an Ace wrap regularly to help with pain and avoid fluid reaccumulation.  Use Voltaren gel as needed.  I recommend following up with Dr. Conny for ongoing management of knee arthritis.  We will send the fluid from your knee for testing.

## 2024-07-09 ENCOUNTER — Ambulatory Visit (HOSPITAL_COMMUNITY): Payer: Self-pay

## 2024-07-09 LAB — SYNOVIAL CELL COUNT + DIFF, W/ CRYSTALS
Crystals, Fluid: NONE SEEN
Eosinophils-Synovial: 0 % (ref 0–1)
Lymphocytes-Synovial Fld: 49 % — ABNORMAL HIGH (ref 0–20)
Monocyte-Macrophage-Synovial Fluid: 44 % — ABNORMAL LOW (ref 50–90)
Neutrophil, Synovial: 7 % (ref 0–25)
WBC, Synovial: 180 /mm3 (ref 0–200)

## 2024-07-10 ENCOUNTER — Telehealth (HOSPITAL_COMMUNITY): Payer: Self-pay

## 2024-07-10 NOTE — Progress Notes (Signed)
 [facility] NOTE  Primary Care: Antonio Cyndee Jamee JONELLE, DO HF Cardiologist: assign to Dr. Zenaida  HPI: Karen Lutz is a 53 y.o. female with history of heart failure with reduced action fraction, obesity status post bariatric surgery in November 2023 at [facility] and a significant family history of early systolic heart failure.  Her cardiac history dates back to November 01, 2022 when she was found unresponsive by her boyfriend.  Her rhythm on arrival was ventricular fibrillation with an unknown downtime.  She underwent CPR for 23 minutes before ROSC.  On arrival to ED, she was found to be hypokalemic with a QTc of 511 ms with normalization after correction of hypokalemia.  She was intubated and placed on pressors. Echo showed EF of 25%, cardiac MRI with a EF of 22% and preserved RV function. Underwent Biotronik placement for secondary prevention and was started on GDMT.  Echo 9/25 showed EF 45-50%  Today she returns for HF follow up. Overall feeling fine. Has occasional tightness over ICD site, not bothersome. No SOB with activity. She works 3rd shift at [employer]. Denies palpitations, abnormal bleeding, CP, dizziness, edema, or PND/Orthopnea. Appetite ok. Weight at home 175 pounds. Taking all medications. Has frequent yeast infections for past 6 months.   Current Outpatient Medications  Medication Sig Dispense Refill   carvedilol  (COREG ) 12.5 MG tablet Take 1 tablet (12.5 mg total) by mouth 2 (two) times daily with a meal. 180 tablet 3   cyanocobalamin (VITAMIN B12) 1000 MCG/ML injection INJECT 1 ML AS DIRECTED EVERY 30 DAYS     dapagliflozin  propanediol (FARXIGA ) 10 MG TABS tablet Take 1 tablet (10 mg total) by mouth daily. 30 tablet 11   ENTRESTO  49-51 MG TAKE 1 TABLET BY MOUTH TWICE A DAY 180 tablet 1   fluticasone  (FLONASE ) 50 MCG/ACT nasal spray SPRAY 2 SPRAYS INTO EACH NOSTRIL EVERY DAY 48 mL 1   furosemide  (LASIX ) 40 MG tablet Take 0.5 tablets  (20 mg total) by mouth daily. 30 tablet 11   omeprazole  (PRILOSEC OTC) 20 MG tablet Take 20 mg by mouth daily. Check with Dr about getting prescription strength omperazole     spironolactone  (ALDACTONE ) 25 MG tablet TAKE 1 TABLET BY MOUTH EVERYDAY AT BEDTIME 90 tablet 1   topiramate (TOPAMAX) 50 MG tablet Take 50 mg by mouth 2 (two) times daily.     Vitamin D , Ergocalciferol , (DRISDOL) 1.25 MG (50000 UNIT) CAPS capsule Take 1 capsule by mouth once a week.     WEGOVY 0.25 MG/0.5ML SOAJ SMARTSIG:0.5 Milliliter(s) SUB-Q Once a Week     diclofenac  Sodium (VOLTAREN ) 1 % GEL Apply 2 g topically 4 (four) times daily. 50 g 0   No current facility-administered medications for this encounter.   Wt Readings from Last 3 Encounters:  07/13/24 83.7 kg (184 lb 9.6 oz)  04/09/24 83.5 kg (184 lb)  02/25/24 81.2 kg (179 lb)   BP 118/74   Pulse 67   Wt 83.7 kg (184 lb 9.6 oz)   LMP  (LMP Unknown)   SpO2 98%   BMI 30.72 kg/m   PHYSICAL EXAM: General:  NAD. No resp difficulty, walked into clinic HEENT: Normal Neck: Supple. No JVD. Cor: Regular rate & rhythm. No rubs, gallops or murmurs. Lungs: Clear Abdomen: Soft, nontender, nondistended.  Extremities: No cyanosis, clubbing, rash, edema Neuro: Alert & oriented x 3, moves all 4 extremities w/o difficulty. Affect pleasant.   DATA REVIEW  ECG: 11/22/22: Normal sinus rhythm  07/12/24: NSR 62 bpm  ECHO: 10/12/22: LVEF 25%, grade 2 diastolic dysfunction, normal RV function as per my personal interpretation 02/22/23: LVEF 25%-30%, normal RV function.  04/09/24: LVEF 45-50%  CATH: 11/06/2022 1.Normal coronaries 2. Elevated filling pressures.  3. Mild pulmonary venous hypertension.  4. Preserved cardiac output.  1 point  CMR:  11/07/22: 1.  Moderately dilated LV with global hypokinesis, EF 22%.  2.  Normal RV size with EF 50%.  3. No myocardial LGE, so no definitive evidence for prior MI, myocarditis, or infiltrative disease.  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 She has significant family history of heart failure in multiple family members before the age of 24-60.  They may possibly have 1 family member with sudden cardiac arrest.  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  Seen by Dr. Danford; recommended clinical screening for children.  - NYHA I today, volume stable - With frequent GU infections, stop Farxiga  - Continue Lasix  20 mg daily - Continue Entresto  49/51 mg bid. - Continue Coreg  12.5 mg bid. - Continue spironolactone  25 mg daily. - Labs today  V-fib arrest - No etiology determined at this point.  Hypokalemic on arrival during her arrest.   - JUP gene positive; associated with ARVD.  Screening recommended for family members - EP following.    Obesity - Body mass index is 30.72 kg/m.; down from 38. 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 - On GLP  Follow up in 4 months with Dr. Zenaida [name], FNP-BC 07/13/24

## 2024-07-10 NOTE — Telephone Encounter (Signed)
 Called to confirm/remind patient of their appointment at the [facility] on 07/13/24.   Appointment:   [] Confirmed  [x] Left mess   [] No answer/No voice mail  [] VM Full/unable to leave message  [] Phone not in service  And to bring in all medications and/or complete list.

## 2024-07-11 LAB — BODY FLUID CULTURE W GRAM STAIN: Culture: NO GROWTH

## 2024-07-13 ENCOUNTER — Encounter (HOSPITAL_COMMUNITY): Payer: Self-pay

## 2024-07-13 ENCOUNTER — Inpatient Hospital Stay (HOSPITAL_COMMUNITY): Admission: RE | Admit: 2024-07-13 | Discharge: 2024-07-13 | Attending: Family Medicine

## 2024-07-13 ENCOUNTER — Ambulatory Visit (HOSPITAL_COMMUNITY): Payer: Self-pay | Admitting: Family Medicine

## 2024-07-13 VITALS — BP 118/74 | HR 67 | Wt 184.6 lb

## 2024-07-13 DIAGNOSIS — I469 Cardiac arrest, cause unspecified: Secondary | ICD-10-CM | POA: Diagnosis not present

## 2024-07-13 DIAGNOSIS — I5022 Chronic systolic (congestive) heart failure: Secondary | ICD-10-CM | POA: Diagnosis not present

## 2024-07-13 DIAGNOSIS — Z79899 Other long term (current) drug therapy: Secondary | ICD-10-CM | POA: Diagnosis not present

## 2024-07-13 DIAGNOSIS — E669 Obesity, unspecified: Secondary | ICD-10-CM

## 2024-07-13 DIAGNOSIS — Z6830 Body mass index (BMI) 30.0-30.9, adult: Secondary | ICD-10-CM | POA: Diagnosis not present

## 2024-07-13 DIAGNOSIS — Z8674 Personal history of sudden cardiac arrest: Secondary | ICD-10-CM | POA: Diagnosis not present

## 2024-07-13 DIAGNOSIS — Z9884 Bariatric surgery status: Secondary | ICD-10-CM | POA: Diagnosis not present

## 2024-07-13 DIAGNOSIS — I4901 Ventricular fibrillation: Secondary | ICD-10-CM | POA: Diagnosis not present

## 2024-07-13 DIAGNOSIS — Z8249 Family history of ischemic heart disease and other diseases of the circulatory system: Secondary | ICD-10-CM | POA: Diagnosis not present

## 2024-07-13 DIAGNOSIS — I428 Other cardiomyopathies: Secondary | ICD-10-CM | POA: Diagnosis not present

## 2024-07-13 LAB — BASIC METABOLIC PANEL WITH GFR
Anion gap: 7 (ref 5–15)
BUN: 14 mg/dL (ref 6–20)
CO2: 29 mmol/L (ref 22–32)
Calcium: 8.8 mg/dL — ABNORMAL LOW (ref 8.9–10.3)
Chloride: 103 mmol/L (ref 98–111)
Creatinine, Ser: 0.67 mg/dL (ref 0.44–1.00)
GFR, Estimated: >60 mL/min (ref >60)
Glucose, Bld: 96 mg/dL (ref 70–99)
Potassium: 3.4 mmol/L — ABNORMAL LOW (ref 3.5–5.1)
Sodium: 139 mmol/L (ref 135–145)

## 2024-07-13 LAB — BRAIN NATRIURETIC PEPTIDE: B Natriuretic Peptide: 26.2 pg/mL (ref 0.0–100.0)

## 2024-07-13 NOTE — Patient Instructions (Addendum)
 Good to see you today!  STOP Farxiga   Labs done today, your results will be available in MyChart, we will contact you for abnormal readings.  Your physician recommends that you schedule a follow-up appointment 4 months(April) Call office in February to schedule an appointment  If you have any questions or concerns before your next appointment please send us  a message through [place] or call our office at [phone].    TO LEAVE A MESSAGE FOR THE NURSE SELECT OPTION 2, PLEASE LEAVE A MESSAGE INCLUDING: YOUR NAME DATE OF BIRTH CALL BACK NUMBER REASON FOR CALL**this is important as we prioritize the call backs  YOU WILL RECEIVE A CALL BACK THE SAME DAY AS LONG AS YOU CALL BEFORE 4:00 PM At the [facility], you and your health needs are our priority. As part of our continuing mission to provide you with exceptional heart care, we have created designated Provider Care Teams. These Care Teams include your primary Cardiologist (physician) and Advanced Practice Providers (APPs- Physician Assistants and Nurse Practitioners) who all work together to provide you with the care you need, when you need it.   You may see any of the following providers on your designated Care Team at your next follow up: Dr Toribio Fuel Dr Ezra Shuck Dr. Morene Brownie Greig Mosses, NP Caffie Shed, [place] [facility] [place], [place] Beckey Coe, NP Jordan Lee, NP Ellouise Class, NP Tinnie Redman, PharmD Jaun Bash, PharmD   Please be sure to bring in all your medications bottles to every appointment.    Thank you for choosing [facility]

## 2024-07-14 MED ORDER — POTASSIUM CHLORIDE CRYS ER 20 MEQ PO TBCR: 20.0000 meq | EXTENDED_RELEASE_TABLET | Freq: Every day | ORAL | 3 refills | Status: DC

## 2024-07-15 DIAGNOSIS — Z9884 Bariatric surgery status: Secondary | ICD-10-CM | POA: Diagnosis not present

## 2024-07-15 DIAGNOSIS — E876 Hypokalemia: Secondary | ICD-10-CM | POA: Diagnosis not present

## 2024-07-27 ENCOUNTER — Ambulatory Visit (HOSPITAL_COMMUNITY)

## 2024-08-10 ENCOUNTER — Ambulatory Visit: Payer: BLUE CROSS/BLUE SHIELD

## 2024-08-10 DIAGNOSIS — I5022 Chronic systolic (congestive) heart failure: Secondary | ICD-10-CM

## 2024-08-11 ENCOUNTER — Ambulatory Visit: Payer: Self-pay | Admitting: Cardiology

## 2024-08-11 LAB — CUP PACEART REMOTE DEVICE CHECK
Date Time Interrogation Session: 20260105092517
Implantable Lead Connection Status: 753985
Implantable Lead Implant Date: 20240405
Implantable Lead Location: 753860
Implantable Lead Model: 436909
Implantable Lead Serial Number: 81561050
Implantable Pulse Generator Implant Date: 20240405
Pulse Gen Model: 429525
Pulse Gen Serial Number: 84953112

## 2024-08-13 NOTE — Progress Notes (Signed)
 Remote ICD Transmission

## 2024-08-26 ENCOUNTER — Telehealth (HOSPITAL_COMMUNITY): Payer: Self-pay | Admitting: Licensed Clinical Social Worker

## 2024-08-26 ENCOUNTER — Telehealth (HOSPITAL_COMMUNITY): Payer: Self-pay | Admitting: Cardiology

## 2024-08-26 DIAGNOSIS — I5022 Chronic systolic (congestive) heart failure: Secondary | ICD-10-CM

## 2024-08-26 NOTE — Telephone Encounter (Signed)
 Patient left message on the triage line requesting referral to [facility] to help with disability process  Will forward to SW team to review.

## 2024-08-26 NOTE — Telephone Encounter (Signed)
 H&V Care Navigation CSW Progress Note  Clinical Social Worker referred to speak with pt regarding [facility] referral for disability application assistance.  CSW spoke with pt and gathered information for referral- referral emailed to [facility].  Pt also reported she lost her job at the end of December and is behind one month of rent.  Pt qualifies for Patient Care Fund assistance so requested required documentation and landlord information so I could work on applying for rent payment of [phone]- awaiting required paperwork.  Karen HILARIO Leech, LCSW Clinical Social Worker [facility] Desk#: [phone] Cell#: [phone]

## 2024-08-27 ENCOUNTER — Telehealth (HOSPITAL_COMMUNITY): Payer: Self-pay | Admitting: Licensed Clinical Social Worker

## 2024-08-27 NOTE — Telephone Encounter (Signed)
 H&V Care Navigation CSW Progress Note  Clinical Social Worker received all required paperwork for check request- request submitted and promise letter sent to landlord.  Andriette HILARIO Leech, LCSW Clinical Social Worker [facility] Desk#: [phone] Cell#: [phone]

## 2024-08-28 ENCOUNTER — Other Ambulatory Visit (HOSPITAL_COMMUNITY): Payer: Self-pay

## 2024-08-28 MED ORDER — OMEPRAZOLE MAGNESIUM 20 MG PO TBEC: 20.0000 mg | DELAYED_RELEASE_TABLET | Freq: Every day | ORAL | 3 refills | Status: AC

## 2024-08-28 MED ORDER — FUROSEMIDE 40 MG PO TABS: 20.0000 mg | ORAL_TABLET | Freq: Every day | ORAL | 11 refills | Status: AC

## 2024-08-28 MED ORDER — CARVEDILOL 12.5 MG PO TABS: 12.5000 mg | ORAL_TABLET | Freq: Two times a day (BID) | ORAL | 3 refills | Status: AC

## 2024-08-28 MED ORDER — SACUBITRIL-VALSARTAN 49-51 MG PO TABS: 1.0000 | ORAL_TABLET | Freq: Two times a day (BID) | ORAL | 1 refills | Status: AC

## 2024-08-28 MED ORDER — SPIRONOLACTONE 25 MG PO TABS: 25.0000 mg | ORAL_TABLET | Freq: Every day | ORAL | 1 refills | Status: AC

## 2024-08-28 MED ORDER — POTASSIUM CHLORIDE CRYS ER 20 MEQ PO TBCR: 20.0000 meq | EXTENDED_RELEASE_TABLET | Freq: Every day | ORAL | 3 refills | Status: AC

## 2024-08-31 ENCOUNTER — Other Ambulatory Visit: Payer: Self-pay | Admitting: Family Medicine

## 2024-08-31 ENCOUNTER — Other Ambulatory Visit (HOSPITAL_COMMUNITY): Payer: Self-pay | Admitting: Family Medicine

## 2024-08-31 ENCOUNTER — Other Ambulatory Visit: Payer: Self-pay

## 2024-08-31 DIAGNOSIS — J0140 Acute pansinusitis, unspecified: Secondary | ICD-10-CM

## 2024-08-31 NOTE — Telephone Encounter (Signed)
 Copied from CRM #[id number]. Topic: Clinical - Prescription Issue >> Aug 31, 2024 10:22 AM Chasity T wrote: Reason for CRM: [facility] to request that the medications fluticasone  (FLONASE ) 50 MCG/ACT nasal spray and diclofenac  Sodium (VOLTAREN ) 1 % GEL be sent to their pharmacy due to the patient wanting to use their pharmacy only now.   [facility] - [address] [place] [address] [place] [address] Phone: [phone] Fax: [phone] Hours: Not open 24 hours

## 2024-09-01 MED ORDER — DICLOFENAC SODIUM 1 % EX GEL: 2.0000 g | Freq: Four times a day (QID) | CUTANEOUS | 0 refills | Status: AC

## 2024-09-08 ENCOUNTER — Encounter (HOSPITAL_COMMUNITY): Payer: Self-pay

## 2024-09-08 ENCOUNTER — Emergency Department (HOSPITAL_COMMUNITY)
Admission: EM | Admit: 2024-09-08 | Discharge: 2024-09-08 | Disposition: A | Discharge status: Home / Self Care | Attending: Emergency Medicine | Admitting: Emergency Medicine

## 2024-09-08 ENCOUNTER — Other Ambulatory Visit: Payer: Self-pay

## 2024-09-08 DIAGNOSIS — Z79899 Other long term (current) drug therapy: Secondary | ICD-10-CM | POA: Insufficient documentation

## 2024-09-08 DIAGNOSIS — M5432 Sciatica, left side: Secondary | ICD-10-CM

## 2024-09-08 DIAGNOSIS — M5442 Lumbago with sciatica, left side: Secondary | ICD-10-CM | POA: Insufficient documentation

## 2024-09-08 LAB — URINALYSIS, ROUTINE W REFLEX MICROSCOPIC
Bilirubin Urine: NEGATIVE
Glucose, UA: NEGATIVE mg/dL
Hgb urine dipstick: NEGATIVE
Ketones, ur: NEGATIVE mg/dL
Leukocytes,Ua: NEGATIVE
Nitrite: NEGATIVE
Protein, ur: NEGATIVE mg/dL
Specific Gravity, Urine: 1.028 (ref 1.005–1.030)
pH: 5 (ref 5.0–8.0)

## 2024-09-08 MED ORDER — METHOCARBAMOL 500 MG PO TABS
1000.0000 mg | ORAL_TABLET | Freq: Once | ORAL | Status: AC
  Administered 2024-09-08: 1000 mg via ORAL
  Filled 2024-09-08: qty 2

## 2024-09-08 MED ORDER — KETOROLAC TROMETHAMINE 30 MG/ML IJ SOLN
30.0000 mg | Freq: Once | INTRAMUSCULAR | Status: AC
  Administered 2024-09-08: 30 mg via INTRAMUSCULAR
  Filled 2024-09-08: qty 1

## 2024-09-08 MED ORDER — IBUPROFEN 600 MG PO TABS: 600.0000 mg | ORAL_TABLET | Freq: Four times a day (QID) | ORAL | 0 refills | Status: AC | PRN

## 2024-09-08 MED ORDER — HYDROCODONE-ACETAMINOPHEN 5-325 MG PO TABS: 1.0000 | ORAL_TABLET | ORAL | 0 refills | Status: AC | PRN

## 2024-09-08 MED ORDER — HYDROMORPHONE HCL 1 MG/ML IJ SOLN
1.0000 mg | Freq: Once | INTRAMUSCULAR | Status: AC
  Administered 2024-09-08: 1 mg via INTRAMUSCULAR
  Filled 2024-09-08: qty 1

## 2024-09-08 MED ORDER — METHOCARBAMOL 500 MG PO TABS: 1000.0000 mg | ORAL_TABLET | Freq: Four times a day (QID) | ORAL | 0 refills | Status: AC

## 2024-09-08 NOTE — ED Triage Notes (Signed)
 Pain in the lower back that started Monday morning and has gotten worse. Seems to radiate down the left leg.

## 2024-09-08 NOTE — ED Provider Notes (Signed)
 " [facility] Provider Note   CSN: [id number] Arrival date & time: 09/08/24  9955     Patient presents with: Back Pain   Karen Lutz is a 54 y.o. female.   Patient to ED with c/o severe left low back pain radiating to left anterior leg to lateral calf that started yesterday without known injury. No heavy lifting, bending, impact. No history of similar symptoms. She denies bladder or bowel dysfunction. No numbness or weakness of the lower extremity. She has taken Tylenol  without relief.   The history is provided by the patient. No language interpreter was used.  Back Pain      Prior to Admission medications  Medication Sig Start Date End Date Taking? Authorizing Provider  HYDROcodone -acetaminophen  (NORCO/VICODIN) 5-325 MG tablet Take 1 tablet by mouth every 4 (four) hours as needed for severe pain (pain score 7-10). 09/08/24  Yes Odell Balls, PA-C  ibuprofen  (ADVIL ) 600 MG tablet Take 1 tablet (600 mg total) by mouth every 6 (six) hours as needed. 09/08/24  Yes Gayna Braddy, Balls, PA-C  methocarbamol  (ROBAXIN ) 500 MG tablet Take 2 tablets (1,000 mg total) by mouth 4 (four) times daily. 09/08/24  Yes Odell Balls, PA-C  carvedilol  (COREG ) 12.5 MG tablet Take 1 tablet (12.5 mg total) by mouth 2 (two) times daily with a meal. 08/28/24   Milford, Harlene HERO, FNP  cyanocobalamin  (VITAMIN B12) 1000 MCG/ML injection INJECT 1 ML AS DIRECTED EVERY 30 DAYS    [provider]  diclofenac  Sodium (VOLTAREN ) 1 % GEL Apply 2 g topically 4 (four) times daily. 09/01/24   Antonio Cyndee Jamee JONELLE, DO  fluticasone  (FLONASE ) 50 MCG/ACT nasal spray Place 2 sprays into both nostrils daily. Due for appt 08/31/24   Antonio Cyndee Jamee R, DO  furosemide  (LASIX ) 40 MG tablet Take 0.5 tablets (20 mg total) by mouth daily. 08/28/24   Milford, Harlene HERO, FNP  omeprazole  (PRILOSEC  OTC) 20 MG tablet Take 1 tablet (20 mg total) by mouth daily. Check with Dr about getting  prescription strength omperazole 08/28/24   Glena Harlene HERO, FNP  Potassium Chloride  ER 20 MEQ TBCR TAKE 1 TABLET BY MOUTH ONCE DAILY *NEW PRESCRIPTION REQUEST* 08/31/24   Zenaida Morene PARAS, MD  potassium chloride  SA (KLOR-CON  M) 20 MEQ tablet Take 1 tablet (20 mEq total) by mouth daily. 08/28/24   Milford, Harlene HERO, FNP  sacubitril -valsartan  (ENTRESTO ) 49-51 MG Take 1 tablet by mouth 2 (two) times daily. 08/28/24   Milford, Harlene HERO, FNP  spironolactone  (ALDACTONE ) 25 MG tablet Take 1 tablet (25 mg total) by mouth daily. 08/28/24   Glena Harlene HERO, FNP  topiramate (TOPAMAX) 50 MG tablet Take 50 mg by mouth 2 (two) times daily.    [provider]  Vitamin D , Ergocalciferol , (DRISDOL) 1.25 MG (50000 UNIT) CAPS capsule Take 1 capsule by mouth once a week. 10/03/23   [provider]  WEGOVY 0.25 MG/0.5ML SOAJ SMARTSIG:0.5 Milliliter(s) SUB-Q Once a Week    [provider]  albuterol  (VENTOLIN  HFA) 108 (90 Base) MCG/ACT inhaler Inhale 2 puffs into the lungs every 4 (four) hours as needed for wheezing or shortness of breath. Patient not taking: Reported on 10/30/2020 09/08/20 11/24/20  Teresa Shelba JONELLE, NP    Allergies: Penicillins and Penicillin g    Review of Systems  Musculoskeletal:  Positive for back pain.    Updated Vital Signs BP 121/83 (BP Location: Right Arm)   Pulse 83   Temp 97.9 F (  36.6 C) (Oral)   Resp 18   Ht 5' 5 (1.651 m)   Wt 82.6 kg   LMP  (LMP Unknown)   SpO2 100%   BMI 30.29 kg/m   Physical Exam Vitals and nursing note reviewed.  Constitutional:      Appearance: She is obese.     Comments: Crying, tearful  Cardiovascular:     Rate and Rhythm: Normal rate.     Pulses: Normal pulses.  Pulmonary:     Effort: Pulmonary effort is normal.  Abdominal:     Palpations: Abdomen is soft.     Tenderness: There is no abdominal tenderness.  Musculoskeletal:       Back:     Comments: Tender to palpation. No midline tenderness of the lumbar  spine.  Skin:    General: Skin is warm.  Neurological:     Sensory: No sensory deficit.     Deep Tendon Reflexes: Reflexes normal.     (all labs ordered are listed, but only abnormal results are displayed) Labs Reviewed  URINALYSIS, ROUTINE W REFLEX MICROSCOPIC - Abnormal; Notable for the following components:      Result Value   APPearance HAZY (*)    All other components within normal limits    EKG: None  Radiology: No results found.   Procedures   Medications Ordered in the ED  methocarbamol  (ROBAXIN ) tablet 1,000 mg (1,000 mg Oral Given 09/08/24 0130)  HYDROmorphone  (DILAUDID ) injection 1 mg (1 mg Intramuscular Given 09/08/24 0134)  ketorolac  (TORADOL ) 30 MG/ML injection 30 mg (30 mg Intramuscular Given 09/08/24 0131)    Clinical Course as of 09/08/24 0235  Tue Sep 08, 2024  0130 Patient to ED with low left back pain for the last 24 hours without injury. No previous back issues. No neurologic deficits on exam. Afebrile. Her tenderness starts at the left flank but is reproducible in the left buttock and low left back. Doubt kidney stone but will check UA. Pain addressed. Patient specifically requesting a shot for pain relief . PDMP reviewed and is not c/w opioid abuse.  [SU]  0231 UA negative. Medications for pain provided good relief. She is felt appropriate for discharge home. F/U PCP. [SU]    Clinical Course User Index [SU] Odell Balls, PA-C                                 Medical Decision Making Amount and/or Complexity of Data Reviewed Labs: ordered.  Risk Prescription drug management.        Final diagnoses:  Sciatica of left side    ED Discharge Orders          Ordered    methocarbamol  (ROBAXIN ) 500 MG tablet  4 times daily        09/08/24 0234    ibuprofen  (ADVIL ) 600 MG tablet  Every 6 hours PRN        09/08/24 0234    HYDROcodone -acetaminophen  (NORCO/VICODIN) 5-325 MG tablet  Every 4 hours PRN        09/08/24 0234                Odell Balls, PA-C 09/08/24 0235    Griselda Norris, MD 09/08/24 [phone]  "

## 2024-09-08 NOTE — Discharge Instructions (Signed)
 Follow up with your doctor in 3-4 days to ensure your pain is improving. Take the medications as prescribed. Rest the back as much as possible.

## 2024-09-08 NOTE — ED Notes (Signed)
 Discharge instructions reviewed.   Newly prescribed medications discussed. Pharmacy verified.   Opportunity for questions and concerns provided.   Alert, oriented and escorted to significant others vehicle via wheelchair. Has a safe ride home.   Displays no signs of distress.
